# Patient Record
Sex: Male | Born: 1935 | Race: White | Hispanic: No | Smoking: Never smoker
Health system: Southern US, Community
[De-identification: ages and names within clinical notes are randomized; demographics above are authoritative.]

## PROBLEM LIST (undated history)

## (undated) DIAGNOSIS — I2510 Atherosclerotic heart disease of native coronary artery without angina pectoris: Secondary | ICD-10-CM

## (undated) DIAGNOSIS — J449 Chronic obstructive pulmonary disease, unspecified: Secondary | ICD-10-CM

## (undated) DIAGNOSIS — I509 Heart failure, unspecified: Secondary | ICD-10-CM

## (undated) DIAGNOSIS — I10 Essential (primary) hypertension: Secondary | ICD-10-CM

## (undated) DIAGNOSIS — G4730 Sleep apnea, unspecified: Secondary | ICD-10-CM

## (undated) DIAGNOSIS — J45909 Unspecified asthma, uncomplicated: Secondary | ICD-10-CM

## (undated) DIAGNOSIS — J8410 Pulmonary fibrosis, unspecified: Secondary | ICD-10-CM

## (undated) DIAGNOSIS — I219 Acute myocardial infarction, unspecified: Secondary | ICD-10-CM

## (undated) DIAGNOSIS — M1990 Unspecified osteoarthritis, unspecified site: Secondary | ICD-10-CM

## (undated) DIAGNOSIS — K219 Gastro-esophageal reflux disease without esophagitis: Secondary | ICD-10-CM

## (undated) DIAGNOSIS — E785 Hyperlipidemia, unspecified: Secondary | ICD-10-CM

## (undated) DIAGNOSIS — H9190 Unspecified hearing loss, unspecified ear: Secondary | ICD-10-CM

## (undated) DIAGNOSIS — E119 Type 2 diabetes mellitus without complications: Secondary | ICD-10-CM

## (undated) DIAGNOSIS — N179 Acute kidney failure, unspecified: Secondary | ICD-10-CM

## (undated) HISTORY — DX: Gastro-esophageal reflux disease without esophagitis: K21.9

## (undated) HISTORY — DX: Acute myocardial infarction, unspecified: I21.9

## (undated) HISTORY — PX: CORONARY ANGIOPLASTY WITH STENT PLACEMENT: SHX49

## (undated) HISTORY — DX: Hyperlipidemia, unspecified: E78.5

## (undated) HISTORY — PX: TOTAL KNEE ARTHROPLASTY: SHX125

## (undated) HISTORY — PX: JOINT REPLACEMENT: SHX530

## (undated) HISTORY — PX: KNEE SURGERY: SHX244

---

## 2011-09-20 LAB — HM COLONOSCOPY

## 2014-07-03 LAB — HM DIABETES EYE EXAM

## 2015-05-06 ENCOUNTER — Encounter (HOSPITAL_BASED_OUTPATIENT_CLINIC_OR_DEPARTMENT_OTHER): Payer: Self-pay

## 2015-05-06 ENCOUNTER — Emergency Department (HOSPITAL_BASED_OUTPATIENT_CLINIC_OR_DEPARTMENT_OTHER)
Admission: EM | Admit: 2015-05-06 | Discharge: 2015-05-06 | Disposition: A | Discharge status: Home / Self Care | Payer: Medicare Other | Attending: Emergency Medicine | Admitting: Emergency Medicine

## 2015-05-06 DIAGNOSIS — I10 Essential (primary) hypertension: Secondary | ICD-10-CM | POA: Diagnosis not present

## 2015-05-06 DIAGNOSIS — Y998 Other external cause status: Secondary | ICD-10-CM | POA: Diagnosis not present

## 2015-05-06 DIAGNOSIS — Z7982 Long term (current) use of aspirin: Secondary | ICD-10-CM | POA: Insufficient documentation

## 2015-05-06 DIAGNOSIS — Z9861 Coronary angioplasty status: Secondary | ICD-10-CM | POA: Diagnosis not present

## 2015-05-06 DIAGNOSIS — Z79899 Other long term (current) drug therapy: Secondary | ICD-10-CM | POA: Diagnosis not present

## 2015-05-06 DIAGNOSIS — L089 Local infection of the skin and subcutaneous tissue, unspecified: Secondary | ICD-10-CM | POA: Diagnosis not present

## 2015-05-06 DIAGNOSIS — I2510 Atherosclerotic heart disease of native coronary artery without angina pectoris: Secondary | ICD-10-CM | POA: Insufficient documentation

## 2015-05-06 DIAGNOSIS — J449 Chronic obstructive pulmonary disease, unspecified: Secondary | ICD-10-CM | POA: Diagnosis not present

## 2015-05-06 DIAGNOSIS — W108XXA Fall (on) (from) other stairs and steps, initial encounter: Secondary | ICD-10-CM | POA: Diagnosis not present

## 2015-05-06 DIAGNOSIS — I509 Heart failure, unspecified: Secondary | ICD-10-CM | POA: Diagnosis not present

## 2015-05-06 DIAGNOSIS — E119 Type 2 diabetes mellitus without complications: Secondary | ICD-10-CM | POA: Insufficient documentation

## 2015-05-06 DIAGNOSIS — T148XXA Other injury of unspecified body region, initial encounter: Secondary | ICD-10-CM

## 2015-05-06 DIAGNOSIS — Y9389 Activity, other specified: Secondary | ICD-10-CM | POA: Diagnosis not present

## 2015-05-06 DIAGNOSIS — S80812A Abrasion, left lower leg, initial encounter: Secondary | ICD-10-CM | POA: Diagnosis not present

## 2015-05-06 DIAGNOSIS — H9190 Unspecified hearing loss, unspecified ear: Secondary | ICD-10-CM | POA: Diagnosis not present

## 2015-05-06 DIAGNOSIS — Y9289 Other specified places as the place of occurrence of the external cause: Secondary | ICD-10-CM | POA: Insufficient documentation

## 2015-05-06 DIAGNOSIS — L539 Erythematous condition, unspecified: Secondary | ICD-10-CM | POA: Diagnosis present

## 2015-05-06 HISTORY — DX: Essential (primary) hypertension: I10

## 2015-05-06 HISTORY — DX: Type 2 diabetes mellitus without complications: E11.9

## 2015-05-06 HISTORY — DX: Unspecified hearing loss, unspecified ear: H91.90

## 2015-05-06 HISTORY — DX: Sleep apnea, unspecified: G47.30

## 2015-05-06 HISTORY — DX: Heart failure, unspecified: I50.9

## 2015-05-06 HISTORY — DX: Unspecified asthma, uncomplicated: J45.909

## 2015-05-06 HISTORY — DX: Atherosclerotic heart disease of native coronary artery without angina pectoris: I25.10

## 2015-05-06 HISTORY — DX: Chronic obstructive pulmonary disease, unspecified: J44.9

## 2015-05-06 MED ORDER — CEPHALEXIN 500 MG PO CAPS: 500.0000 mg | ORAL_CAPSULE | Freq: Four times a day (QID) | ORAL | Status: DC

## 2015-05-06 NOTE — ED Notes (Signed)
[address] going up steps 2 days ago  Abrasion to left lower leg, w some redness

## 2015-05-06 NOTE — ED Provider Notes (Signed)
CSN: [id number]     Arrival date & time 05/06/15  2048 History   This chart was scribed for Davonna Belling, MD by Forrestine Him, ED Scribe. This patient was seen in room MH01/MH01 and the patient's care was started 9:15 PM.   Chief Complaint  Patient presents with  . Fall   The history is provided by the patient. No language interpreter was used.    HPI Comments: Christopher Holder is a 81 y.o. male with a PMHx of DM, CHF, CAD, COPD, and HTN who presents to the Emergency Department here after a fall sustained 2 days ago. Pt states he was going up some steps when he lost his balance and missed a step resulting in him falling to the ground. No head trauma or LOC. Mr. Goodchild sustained a wound to the L lower extremity. Daughter states area has been draining since fall. Surrounding erythema and warmth also reported. Area of ecchymosis also reported to the L upper extremity. No OTC medications or home remedies attempted prior to arrival. No recent fever, chills, shortness of breath, chest pain, nausea, or vomiting. He is not on any anticoagulants but takes a baby ASA daily. Pt with known allergies to Percocet and Sulfa antibiotics.  Past Medical History  Diagnosis Date  . Diabetes mellitus without complication   . CHF (congestive heart failure)   . Coronary artery disease   . COPD (chronic obstructive pulmonary disease)   . Asthma   . Hypertension   . HOH (hard of hearing)   . Sleep apnea    Past Surgical History  Procedure Laterality Date  . Coronary angioplasty with stent placement    . Knee surgery     No family history on file. Social History  Substance Use Topics  . Smoking status: Never Smoker   . Smokeless tobacco: None  . Alcohol Use: Yes    Review of Systems  Constitutional: Negative for fever and chills.  Respiratory: Negative for cough and shortness of breath.   Cardiovascular: Negative for chest pain.  Gastrointestinal: Negative for nausea, vomiting and abdominal pain.    Genitourinary: Negative for dysuria.  Musculoskeletal: Negative for back pain.  Skin: Positive for wound.  Neurological: Negative for headaches.  Psychiatric/Behavioral: Negative for confusion.      Allergies  Percocet and Sulfa antibiotics  Home Medications   Prior to Admission medications   Medication Sig Start Date End Date Taking? Authorizing Provider  albuterol (PROVENTIL HFA;VENTOLIN HFA) 108 (90 BASE) MCG/ACT inhaler Inhale into the lungs every 6 (six) hours as needed for wheezing or shortness of breath.   Yes Historical Provider, MD  allopurinol (ZYLOPRIM) 100 MG tablet Take 100 mg by mouth daily.   Yes Historical Provider, MD  aspirin 81 MG tablet Take 81 mg by mouth daily.   Yes Historical Provider, MD  bumetanide (BUMEX) 2 MG tablet Take by mouth daily.   Yes Historical Provider, MD  carvedilol (COREG) 6.25 MG tablet Take 6.25 mg by mouth 2 (two) times daily with a meal.   Yes Historical Provider, MD  FLUTICASONE FUROATE IN Inhale into the lungs.   Yes Historical Provider, MD  gabapentin (NEURONTIN) 600 MG tablet Take 600 mg by mouth 3 (three) times daily.   Yes Historical Provider, MD  glimepiride (AMARYL) 2 MG tablet Take 2 mg by mouth 2 (two) times daily.   Yes Historical Provider, MD  mometasone-formoterol (DULERA) 100-5 MCG/ACT AERO Inhale 2 puffs into the lungs 2 (two) times daily.   Yes Historical  Provider, MD  montelukast (SINGULAIR) 10 MG tablet Take 10 mg by mouth at bedtime.   Yes Historical Provider, MD  omeprazole (PRILOSEC) 20 MG capsule Take 20 mg by mouth daily.   Yes Historical Provider, MD  potassium chloride (K-DUR,KLOR-CON) 10 MEQ tablet Take 10 mEq by mouth 2 (two) times daily.   Yes Historical Provider, MD  ramipril (ALTACE) 2.5 MG capsule Take 2.5 mg by mouth daily.   Yes Historical Provider, MD  simvastatin (ZOCOR) 20 MG tablet Take 20 mg by mouth daily.   Yes Historical Provider, MD  cephALEXin (KEFLEX) 500 MG capsule Take 1 capsule (500 mg total)  by mouth 4 (four) times daily. 05/06/15   Davonna Belling, MD   Triage Vitals: BP 116/98 mmHg  Pulse 58  Temp(Src) 97.7 F (36.5 C) (Oral)  Resp 18  Ht 6' (1.829 m)  Wt 280 lb (127.007 kg)  BMI 37.97 kg/m2  SpO2 98%   Physical Exam  Constitutional: He is oriented to person, place, and time. He appears well-developed and well-nourished.  HENT:  Head: Normocephalic.  Eyes: EOM are normal.  Neck: Normal range of motion.  Cardiovascular:  Strong DP pulse on the L  Pulmonary/Chest: Effort normal.  Abdominal: He exhibits no distension.  Musculoskeletal: Normal range of motion.  Mild swelling to both lower extremities There is a 3 cm horizontal abrasion/superficial laceration to the anterior aspect of the L lower leg. Some erythema  No fluctuance  No active drainage  Neurological: He is alert and oriented to person, place, and time.  Psychiatric: He has a normal mood and affect.  Nursing note and vitals reviewed.   ED Course  Procedures (including critical care time)  DIAGNOSTIC STUDIES: Oxygen Saturation is 98% on RA, Normal by my interpretation.    COORDINATION OF CARE: 9:23 PM-Discussed treatment plan with pt at bedside and pt agreed to plan.        Labs Review Labs Reviewed - No data to display  Imaging Review No results found. I have personally reviewed and evaluated these images and lab results as part of my medical decision-making.   EKG Interpretation None      MDM   Final diagnoses:  Infected skin tear    Patient with cellulitis at site of skin tear. Will treat with antibiotics.  I personally performed the services described in this documentation, which was scribed in my presence. The recorded information has been reviewed and is accurate.    Davonna Belling, MD 05/06/15 2325

## 2015-05-06 NOTE — Discharge Instructions (Signed)
Cellulitis Cellulitis is an infection of the skin and the tissue beneath it. The infected area is usually red and tender. Cellulitis occurs most often in the arms and lower legs.  CAUSES  Cellulitis is caused by bacteria that enter the skin through cracks or cuts in the skin. The most common types of bacteria that cause cellulitis are staphylococci and streptococci. SIGNS AND SYMPTOMS   Redness and warmth.  Swelling.  Tenderness or pain.  Fever. DIAGNOSIS  Your health care provider can usually determine what is wrong based on a physical exam. Blood tests may also be done. TREATMENT  Treatment usually involves taking an antibiotic medicine. HOME CARE INSTRUCTIONS   Take your antibiotic medicine as directed by your health care provider. Finish the antibiotic even if you start to feel better.  Keep the infected arm or leg elevated to reduce swelling.  Apply a warm cloth to the affected area up to 4 times per day to relieve pain.  Take medicines only as directed by your health care provider.  Keep all follow-up visits as directed by your health care provider. SEEK MEDICAL CARE IF:   You notice red streaks coming from the infected area.  Your red area gets larger or turns dark in color.  Your bone or joint underneath the infected area becomes painful after the skin has healed.  Your infection returns in the same area or another area.  You notice a swollen bump in the infected area.  You develop new symptoms.  You have a fever. SEEK IMMEDIATE MEDICAL CARE IF:   You feel very sleepy.  You develop vomiting or diarrhea.  You have a general ill feeling (malaise) with muscle aches and pains. MAKE SURE YOU:   Understand these instructions.  Will watch your condition.  Will get help right away if you are not doing well or get worse. Document Released: 06/15/2005 Document Revised: 01/20/2014 Document Reviewed: 11/21/2011 [facility] Patient Information 2015 [place], [place].  This information is not intended to replace advice given to you by your health care provider. Make sure you discuss any questions you have with your health care provider. Skin Tear Care A skin tear is a wound in which the top layer of skin has peeled off. This is a common problem with aging because the skin becomes thinner and more fragile as a person gets older. In addition, some medicines, such as oral corticosteroids, can lead to skin thinning if taken for long periods of time.  A skin tear is often repaired with tape or skin adhesive strips. This keeps the skin that has been peeled off in contact with the healthier skin beneath. Depending on the location of the wound, a bandage (dressing) may be applied over the tape or skin adhesive strips. Sometimes, during the healing process, the skin turns black and dies. Even when this happens, the torn skin acts as a good dressing until the skin underneath gets healthier and repairs itself. HOME CARE INSTRUCTIONS   Change dressings once per day or as directed by your caregiver.  Gently clean the skin tear and the area around the tear using saline solution or mild soap and water.  Do not rub the injured skin dry. Let the area air dry.  Apply petroleum jelly or an antibiotic cream or ointment to keep the tear moist. This will help the wound heal. Do not allow a scab to form.  If the dressing sticks before the next dressing change, moisten it with warm soapy water and gently  remove it.  Protect the injured skin until it has healed.  Only take over-the-counter or prescription medicines as directed by your caregiver.  Take showers or baths using warm soapy water. Apply a new dressing after the shower or bath.  Keep all follow-up appointments as directed by your caregiver.  SEEK IMMEDIATE MEDICAL CARE IF:   You have redness, swelling, or increasing pain in the skin tear.  You havepus coming from the skin tear.  You have chills.  You have a red  streak that goes away from the skin tear.  You have a bad smell coming from the tear or dressing.  You have a fever or persistent symptoms for more than 2-3 days.  You have a fever and your symptoms suddenly get worse. MAKE SURE YOU:  Understand these instructions.  Will watch this condition.  Will get help right away if your child is not doing well or gets worse. Document Released: 05/31/2001 Document Revised: 05/30/2012 Document Reviewed: 03/19/2012 [facility] Patient Information 2015 [place], [place]. This information is not intended to replace advice given to you by your health care provider. Make sure you discuss any questions you have with your health care provider.

## 2015-05-06 NOTE — ED Notes (Signed)
Lost balance/ fall 2 days ago-injury to left LE

## 2015-05-26 ENCOUNTER — Telehealth: Payer: Self-pay | Admitting: Behavioral Health

## 2015-05-26 ENCOUNTER — Encounter: Payer: Self-pay | Admitting: Behavioral Health

## 2015-05-26 NOTE — Telephone Encounter (Signed)
Error

## 2015-05-26 NOTE — Telephone Encounter (Signed)
Pre-Visit Call completed with patient and chart updated.   Pre-Visit Info documented in Specialty Comments under [employer].    

## 2015-05-27 ENCOUNTER — Encounter: Payer: Self-pay | Admitting: Medical

## 2015-05-27 ENCOUNTER — Ambulatory Visit (INDEPENDENT_AMBULATORY_CARE_PROVIDER_SITE_OTHER): Payer: Medicare Other | Admitting: Medical

## 2015-05-27 VITALS — BP 110/65 | HR 60 | Temp 98.5°F | Ht 71.5 in | Wt 286.0 lb

## 2015-05-27 DIAGNOSIS — J438 Other emphysema: Secondary | ICD-10-CM

## 2015-05-27 DIAGNOSIS — I509 Heart failure, unspecified: Secondary | ICD-10-CM | POA: Insufficient documentation

## 2015-05-27 DIAGNOSIS — Z23 Encounter for immunization: Secondary | ICD-10-CM

## 2015-05-27 DIAGNOSIS — J3089 Other allergic rhinitis: Secondary | ICD-10-CM

## 2015-05-27 DIAGNOSIS — E1140 Type 2 diabetes mellitus with diabetic neuropathy, unspecified: Secondary | ICD-10-CM | POA: Insufficient documentation

## 2015-05-27 DIAGNOSIS — J309 Allergic rhinitis, unspecified: Secondary | ICD-10-CM | POA: Insufficient documentation

## 2015-05-27 DIAGNOSIS — K219 Gastro-esophageal reflux disease without esophagitis: Secondary | ICD-10-CM

## 2015-05-27 DIAGNOSIS — G4730 Sleep apnea, unspecified: Secondary | ICD-10-CM | POA: Diagnosis not present

## 2015-05-27 DIAGNOSIS — E119 Type 2 diabetes mellitus without complications: Secondary | ICD-10-CM | POA: Insufficient documentation

## 2015-05-27 DIAGNOSIS — E785 Hyperlipidemia, unspecified: Secondary | ICD-10-CM | POA: Diagnosis not present

## 2015-05-27 DIAGNOSIS — I10 Essential (primary) hypertension: Secondary | ICD-10-CM | POA: Diagnosis not present

## 2015-05-27 DIAGNOSIS — I5022 Chronic systolic (congestive) heart failure: Secondary | ICD-10-CM

## 2015-05-27 HISTORY — DX: Hyperlipidemia, unspecified: E78.5

## 2015-05-27 HISTORY — DX: Gastro-esophageal reflux disease without esophagitis: K21.9

## 2015-05-27 MED ORDER — INFLUENZA VAC SPLIT QUAD 0.5 ML IM SUSY
0.5000 mL | PREFILLED_SYRINGE | Freq: Once | INTRAMUSCULAR | Status: AC
  Administered 2015-05-27: 0.5 mL via INTRAMUSCULAR

## 2015-05-27 NOTE — Assessment & Plan Note (Signed)
On singulair

## 2015-05-27 NOTE — Assessment & Plan Note (Signed)
controlled on prilosec.

## 2015-05-27 NOTE — Patient Instructions (Addendum)
Asthma and copd- stable presently. On on albuterol and dulera.  Diabetes type II- only on glimepiride.   Sleep apnea- use machine at night.  Hyperlipidemia- on zocor.  Hypertension- on carvedilol and bumex.  Pt had MI in past. Stents placed 81 yo.  Gerd hx- congtrolled on prilosec.  Allergic rhinitis- seasonal. On singulair.   Diabetic neuropathy- he is on neurontin.  Continue all current meds9appear stable today) and sign release form so we can get prior records. Get basic labs today.  Refer to cardiologist, pulmonologist and ophthalmologist.  Follow up in 1 month or as needed  I want you to start checking bp daily. Dccument readings and call us in a week.

## 2015-05-27 NOTE — Progress Notes (Signed)
Subjective:    Patient ID: Christopher Holder, male    DOB: 10-04-1929, 81 y.o.   MRN: [id number]  HPI  I have reviewed pt PMH, PSH, FH, Social History and Surgical History  Asthma and copd- stable presently. On on albuterol and dulera.  Diabetes type II- only on glimepiride.   Sleep apnea- use machine at night.  Hyperlipidemia- on zocor.  Hypertension- on carvedilol and bumex.  Pt had MI in past. Stents placed 81 yo.  Gerd hx- controlled on prilosec.  Allergic rhinitis- seasonal. On singulair.   Diabetic neuropathy- he is on neurontin.   Pt sees cardiologist and pulmonologist. Last saw cardiologist 3 months ago. Pt has pulmonologist appointment next week. Pt sees lung MD every 4-6 months. Pt needs new referrals.   Pt needs eye md referal for yearly check up.   Pt just moved from [place], [place]     Review of Systems  Constitutional: Negative for fever, chills, diaphoresis, activity change and fatigue.  Respiratory: Negative for cough, chest tightness and shortness of breath.   Cardiovascular: Negative for chest pain, palpitations and leg swelling.  Gastrointestinal: Negative for nausea, vomiting and abdominal pain.  Musculoskeletal: Negative for neck pain and neck stiffness.  Neurological: Negative for dizziness, tremors, seizures, syncope, facial asymmetry, speech difficulty, weakness, light-headedness, numbness and headaches.  Psychiatric/Behavioral: Negative for behavioral problems, confusion and agitation. The patient is not nervous/anxious.    Past Medical History  Diagnosis Date  . Diabetes mellitus without complication   . CHF (congestive heart failure)   . Coronary artery disease   . COPD (chronic obstructive pulmonary disease)   . Asthma   . Hypertension   . HOH (hard of hearing)   . Sleep apnea   . Myocardial infarction     Social History   Social History  . Marital Status: Widowed    Spouse Name: [name]  . Number of Children: [name]  . Years of Education:  [name]   Occupational History  . Not on file.   Social History Main Topics  . Smoking status: Never Smoker   . Smokeless tobacco: Never Used  . Alcohol Use: No  . Drug Use: No  . Sexual Activity: Not on file   Other Topics Concern  . Not on file   Social History Narrative    Past Surgical History  Procedure Laterality Date  . Coronary angioplasty with stent placement    . Knee surgery    . Knee surgery      Knee replacement x2    Family History  Problem Relation Age of Onset  . Cancer Mother   . Diabetes Father     Allergies  Allergen Reactions  . Percocet [Oxycodone-Acetaminophen] Diarrhea  . Sulfa Antibiotics Diarrhea and Nausea And Vomiting    Current Outpatient Prescriptions on File Prior to Visit  Medication Sig Dispense Refill  . albuterol (PROVENTIL HFA;VENTOLIN HFA) 108 (90 BASE) MCG/ACT inhaler Inhale into the lungs every 6 (six) hours as needed for wheezing or shortness of breath.    . allopurinol (ZYLOPRIM) 100 MG tablet Take 100 mg by mouth daily.    Marland Kitchen aspirin 81 MG tablet Take 81 mg by mouth daily.    . bumetanide (BUMEX) 2 MG tablet Take by mouth daily.    . carvedilol (COREG) 6.25 MG tablet Take 6.25 mg by mouth 2 (two) times daily with a meal.    . ciclopirox (LOPROX) 0.77 % cream Apply topically at bedtime.    Marland Kitchen FLUTICASONE FUROATE IN  Inhale into the lungs.    . gabapentin (NEURONTIN) 600 MG tablet Take 600 mg by mouth 3 (three) times daily.    Marland Kitchen glimepiride (AMARYL) 2 MG tablet Take 2 mg by mouth 2 (two) times daily.    . mometasone-formoterol (DULERA) 100-5 MCG/ACT AERO Inhale 2 puffs into the lungs 2 (two) times daily.    . montelukast (SINGULAIR) 10 MG tablet Take 10 mg by mouth at bedtime.    Marland Kitchen omeprazole (PRILOSEC) 20 MG capsule Take 20 mg by mouth daily.    . potassium chloride (K-DUR,KLOR-CON) 10 MEQ tablet Take 10 mEq by mouth 2 (two) times daily.    . ramipril (ALTACE) 2.5 MG capsule Take 2.5 mg by mouth daily.    . simvastatin (ZOCOR) 20  MG tablet Take 20 mg by mouth daily.     No current facility-administered medications on file prior to visit.    BP 123/37 mmHg  Pulse 60  Temp(Src) 98.5 F (36.9 C) (Oral)  Ht 5' 11.5" (1.816 m)  Wt 286 lb (129.729 kg)  BMI 39.34 kg/m2  SpO2 96%       Objective:   Physical Exam   General Mental Status- Alert. General Appearance- Not in acute distress.   Skin General: Color- Normal Color. Moisture- Normal Moisture.  Neck Carotid Arteries- Normal color. Moisture- Normal Moisture. No carotid bruits. No JVD.  Chest and Lung Exam Auscultation: Breath Sounds:-Normal.  Cardiovascular Auscultation:Rythm- Regular. Murmurs & Other Heart Sounds:Auscultation of the heart reveals- No Murmurs.  Abdomen Inspection:-Inspeection Normal. Palpation/Percussion:Note:No mass. Palpation and Percussion of the abdomen reveal- Non Tender, Non Distended + BS, no rebound or guarding.    Neurologic Cranial Nerve exam:- CN III-XII intact(No nystagmus), symmetric smile. Drift Test:- No drift. Romberg Exam:- Negative.  Heal to Toe Gait exam:-Normal. Finger to Nose:- Normal/Intact Strength:- 5/5 equal and symmetric strength both upper and lower extremities.  Lower ext- see quality metrics.     Assessment & Plan:  Asthma and copd- stable presently. On on albuterol and dulera.  Diabetes type II- only on glimepiride.   Sleep apnea- use machine at night.  Hyperlipidemia- on zocor.  Hypertension- on carvedilol and bumex.  Pt had MI in past. Stents placed 81 yo.  Gerd hx- congtrolled on prilosec.  Allergic rhinitis- seasonal. On singulair.   Diabetic neuropathy- he is on neurontin.  Continue all current meds9appear stable today) and sign release form so we can get prior records. Get basic labs today.  Refer to cardiologist, pulmonologist and ophthalmologist.  Follow up in 1 month or as needed  I want you to start checking bp daily. Dccument readings and call us in a week.

## 2015-05-27 NOTE — Assessment & Plan Note (Signed)
on carvedilol and bumex.

## 2015-05-27 NOTE — Assessment & Plan Note (Signed)
on glimepiride. Get a1c today.

## 2015-05-27 NOTE — Assessment & Plan Note (Signed)
on zocor.

## 2015-05-27 NOTE — Progress Notes (Signed)
Pre visit review using our clinic review tool, if applicable. No additional management support is needed unless otherwise documented below in the visit note. 

## 2015-05-27 NOTE — Assessment & Plan Note (Signed)
On gabapentin.  

## 2015-05-28 ENCOUNTER — Encounter: Payer: Self-pay | Admitting: Medical

## 2015-05-28 LAB — CBC WITH DIFFERENTIAL/PLATELET
BASOS PCT: 1.1 % (ref 0.0–3.0)
Basophils Absolute: 0.1 10*3/uL (ref 0.0–0.1)
EOS PCT: 1.3 % (ref 0.0–5.0)
Eosinophils Absolute: 0.1 10*3/uL (ref 0.0–0.7)
HCT: 39.8 % (ref 39.0–52.0)
HEMOGLOBIN: 12.9 g/dL — AB (ref 13.0–17.0)
Lymphocytes Relative: 26.4 % (ref 12.0–46.0)
Lymphs Abs: 2.1 10*3/uL (ref 0.7–4.0)
MCHC: 32.5 g/dL (ref 30.0–36.0)
MCV: 92.1 fl (ref 78.0–100.0)
MONO ABS: 0.6 10*3/uL (ref 0.1–1.0)
Monocytes Relative: 7.7 % (ref 3.0–12.0)
Neutro Abs: 5.1 10*3/uL (ref 1.4–7.7)
Neutrophils Relative %: 63.5 % (ref 43.0–77.0)
Platelets: 184 10*3/uL (ref 150.0–400.0)
RBC: 4.32 Mil/uL (ref 4.22–5.81)
RDW: 16.7 % — AB (ref 11.5–15.5)
WBC: 8 10*3/uL (ref 4.0–10.5)

## 2015-05-28 LAB — COMPREHENSIVE METABOLIC PANEL
ALBUMIN: 3.7 g/dL (ref 3.5–5.2)
ALT: 15 U/L (ref 0–53)
AST: 14 U/L (ref 0–37)
Alkaline Phosphatase: 101 U/L (ref 39–117)
BUN: 30 mg/dL — AB (ref 6–23)
CALCIUM: 8.7 mg/dL (ref 8.4–10.5)
CHLORIDE: 101 meq/L (ref 96–112)
CO2: 35 mEq/L — ABNORMAL HIGH (ref 19–32)
Creatinine, Ser: 1.48 mg/dL (ref 0.40–1.50)
GFR: 48.25 mL/min — ABNORMAL LOW (ref >60.00)
Glucose, Bld: 106 mg/dL — ABNORMAL HIGH (ref 70–99)
POTASSIUM: 4.2 meq/L (ref 3.5–5.1)
SODIUM: 142 meq/L (ref 135–145)
Total Bilirubin: 0.4 mg/dL (ref 0.2–1.2)
Total Protein: 6.5 g/dL (ref 6.0–8.3)

## 2015-05-28 LAB — HEMOGLOBIN A1C: HEMOGLOBIN A1C: 6 % (ref 4.6–6.5)

## 2015-05-29 ENCOUNTER — Telehealth: Payer: Self-pay | Admitting: Medical

## 2015-05-29 NOTE — Telephone Encounter (Signed)
Pt returning call for lab results. Best # [phone].

## 2015-05-29 NOTE — Telephone Encounter (Signed)
Spoke with pt and he voices understanding.  

## 2015-06-02 ENCOUNTER — Telehealth: Payer: Self-pay | Admitting: *Deleted

## 2015-06-02 NOTE — Telephone Encounter (Signed)
Medical records received via mail from [facility]. Forwarded to [facility]. JG//CMA

## 2015-06-19 ENCOUNTER — Ambulatory Visit (INDEPENDENT_AMBULATORY_CARE_PROVIDER_SITE_OTHER): Payer: Medicare Other | Admitting: Cardiology

## 2015-06-19 ENCOUNTER — Encounter: Payer: Self-pay | Admitting: Cardiology

## 2015-06-19 VITALS — BP 132/42 | HR 41 | Ht 71.0 in | Wt 286.2 lb

## 2015-06-19 DIAGNOSIS — E669 Obesity, unspecified: Secondary | ICD-10-CM | POA: Diagnosis not present

## 2015-06-19 DIAGNOSIS — I10 Essential (primary) hypertension: Secondary | ICD-10-CM

## 2015-06-19 DIAGNOSIS — I2510 Atherosclerotic heart disease of native coronary artery without angina pectoris: Secondary | ICD-10-CM

## 2015-06-19 DIAGNOSIS — K219 Gastro-esophageal reflux disease without esophagitis: Secondary | ICD-10-CM | POA: Insufficient documentation

## 2015-06-19 DIAGNOSIS — E785 Hyperlipidemia, unspecified: Secondary | ICD-10-CM

## 2015-06-19 DIAGNOSIS — G4733 Obstructive sleep apnea (adult) (pediatric): Secondary | ICD-10-CM | POA: Insufficient documentation

## 2015-06-19 DIAGNOSIS — J438 Other emphysema: Secondary | ICD-10-CM | POA: Insufficient documentation

## 2015-06-19 NOTE — Patient Instructions (Signed)
Medication Instructions:  Your physician recommends that you continue on your current medications as directed. Please refer to the Current Medication list given to you today.  Labwork: NONE  Testing/Procedures: NONE  Follow-Up: Your physician wants you to follow-up in: 6 months with Dr. Marlou Porch. You will receive a reminder letter in the mail two months in advance. If you don't receive a letter, please call our office to schedule the follow-up appointment.

## 2015-06-19 NOTE — Progress Notes (Signed)
Cardiology Office Note   Date:  06/19/2015   ID:  Christopher Holder, DOB Jan 11, 1930, MRN [id number]  PCP:  Mackie Pai, PA-C  Cardiologist:   Candee Furbish, MD       History of Present Illness: Christopher Holder is a 81 y.o. male here to establish care. Moved from [place]. CAD post MI with PCI over 14 years ago. Hypertension, GERD, diabetes with associated neuropathy and COPD. Last cardiology visit was in the summer time.  Lives in [place]. Worried about driving. Dr. Leonia Corona in [place] was his cardiologist. [facility].  Inferior infarction/scar.   Low diastolic pressure. Fell left eye shoulder knee. No syncope. Bent over to pick up great grandchild. EMS to help pick up.   No CP, no significant SOB.   Had MI on tour bus in [place], started sweating. No real chest pain. 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 99%.   No cath since since.  Had 2 CHF episodes in hospitals about 5 years ago last episode.     Past Medical History  Diagnosis Date  . Diabetes mellitus without complication   . CHF (congestive heart failure)   . Coronary artery disease   . COPD (chronic obstructive pulmonary disease)   . Asthma   . Hypertension   . HOH (hard of hearing)   . Sleep apnea   . Myocardial infarction   . Hyperlipidemia 05/27/2015  . GERD (gastroesophageal reflux disease) 05/27/2015    Past Surgical History  Procedure Laterality Date  . Coronary angioplasty with stent placement    . Knee surgery    . Knee surgery      Knee replacement x2     Current Outpatient Prescriptions  Medication Sig Dispense Refill  . albuterol (PROVENTIL HFA;VENTOLIN HFA) 108 (90 BASE) MCG/ACT inhaler Inhale into the lungs every 6 (six) hours as needed for wheezing or shortness of breath.    . allopurinol (ZYLOPRIM) 100 MG tablet Take 100 mg by mouth 2 (two) times daily.     Marland Kitchen aspirin 81 MG tablet Take 81 mg by mouth daily.    . bumetanide (BUMEX) 2 MG tablet Take 3 mg by mouth daily.     . carvedilol  (COREG) 6.25 MG tablet Take 6.25 mg by mouth 2 (two) times daily with a meal.    . ciclopirox (PENLAC) 8 % solution Apply 1 application topically daily. Apply over nail and surrounding skin. Apply daily over previous coat. After seven (7) days, may remove with alcohol and continue cycle.    . fluticasone (FLONASE) 50 MCG/ACT nasal spray Place 2 sprays into both nostrils daily.    Marland Kitchen gabapentin (NEURONTIN) 600 MG tablet Take 1 tablet by mouth in the morning and at supper and take 2 tablets by mouth at bedtime    . glimepiride (AMARYL) 2 MG tablet Take 2 mg by mouth daily.     . mometasone-formoterol (DULERA) 100-5 MCG/ACT AERO Inhale 2 puffs into the lungs 2 (two) times daily.    . montelukast (SINGULAIR) 10 MG tablet Take 10 mg by mouth at bedtime.    . NON FORMULARY CPAP at night    . omeprazole (PRILOSEC) 20 MG capsule Take 20 mg by mouth daily.    . potassium chloride (K-DUR,KLOR-CON) 10 MEQ tablet Take 10 mEq by mouth 2 (two) times daily.    . ramipril (ALTACE) 2.5 MG capsule Take 2.5 mg by mouth daily.    . simvastatin (ZOCOR) 20 MG tablet Take 20 mg by mouth  daily.     No current facility-administered medications for this visit.    Allergies:   Percocet and Sulfa antibiotics    Social History:  The patient  reports that he has never smoked. He has never used smokeless tobacco. He reports that he does not drink alcohol or use illicit drugs.   Family History:  The patient's family history includes Cancer in his mother; Diabetes in his father.    ROS:  Please see the history of present illness.   Otherwise, review of systems are positive for none.   All other systems are reviewed and negative.    PHYSICAL EXAM: VS:  BP 132/42 mmHg  Pulse 41  Ht 5\' 11"  (1.803 m)  Wt 286 lb 3.2 oz (129.819 kg)  BMI 39.93 kg/m2 , BMI Body mass index is 39.93 kg/(m^2). GEN: Well nourished, well developed, in no acute distress HEENT: normal Neck: no JVD, carotid bruits, or masses Cardiac: RRR; no  murmurs, rubs, or gallops,no edema  Respiratory:  clear to auscultation bilaterally, normal work of breathing GI: soft, nontender, nondistended, + BS MS: no deformity or atrophy Skin: warm and dry, no rash Neuro:  Strength and sensation are intact Psych: euthymic mood, full affect   EKG:  Today 06/19/15- sinus bradycardia rate 50 with first-degree AV block , PR interval 218 ms with frequent PVCs, ventricular bigeminy pattern.  Recent Labs: 05/27/2015: ALT 15; BUN 30*; Creatinine, Ser 1.48; Hemoglobin 12.9*; Platelets 184.0; Potassium 4.2; Sodium 142    Lipid Panel No results found for: CHOL, TRIG, HDL, CHOLHDL, VLDL, LDLCALC, LDLDIRECT    Wt Readings from Last 3 Encounters:  06/19/15 286 lb 3.2 oz (129.819 kg)  05/27/15 286 lb (129.729 kg)  05/06/15 280 lb (127.007 kg)      Other studies Reviewed: Additional studies/ records that were reviewed today include: Prior office note, lab work, EKG. Review of the above records demonstrates: As above   ASSESSMENT AND PLAN:  Coronary artery disease-status post myocardial infarction approximately 14 years ago with stent placement. Doing well, no anginal symptoms. Establishing care.  Hyperlipidemia-currently on simvastatin. LDL goal less than 70.  Diabetes with peripheral neuropathy-Neurontin, oral medication glimepiride per primary.  GERD-Prilosec.  Sleep apnea-obstructive sleep apnea-CPAP at night.  COPD-on both albuterol as well as Dulera.  Essential hypertension-low diastolic pressure. No syncope. We will continue to monitor. I would like to see what his last echo look like. Question aortic regurgitation. I do not hear any diastolic murmur however. This may be from lack of vascular elasticity as one ages.  Frequent PVCs- ventricular bigeminy noted on EKG.   Current medicines are reviewed at length with the patient today.  The patient does not have concerns regarding medicines.  The following changes have been made:  no  change  Labs/ tests ordered today include: none  No orders of the defined types were placed in this encounter.     Disposition:   FU with [facility] in 6 months  Signed, Candee Furbish, MD  06/19/2015 9:00 AM    [facility] [address], [place], [place]  [address] Phone: [phone]; Fax: [phone]

## 2015-06-23 LAB — HM DIABETES EYE EXAM

## 2015-06-25 ENCOUNTER — Encounter: Payer: Self-pay | Admitting: Medical

## 2015-06-26 ENCOUNTER — Ambulatory Visit (INDEPENDENT_AMBULATORY_CARE_PROVIDER_SITE_OTHER): Payer: Medicare Other | Admitting: Medical

## 2015-06-26 ENCOUNTER — Encounter: Payer: Medicare Other | Admitting: Medical

## 2015-06-26 ENCOUNTER — Encounter: Payer: Self-pay | Admitting: Medical

## 2015-06-26 VITALS — BP 116/60 | HR 51 | Temp 98.2°F | Ht 71.5 in | Wt 288.0 lb

## 2015-06-26 DIAGNOSIS — S46811A Strain of other muscles, fascia and tendons at shoulder and upper arm level, right arm, initial encounter: Secondary | ICD-10-CM

## 2015-06-26 DIAGNOSIS — I10 Essential (primary) hypertension: Secondary | ICD-10-CM

## 2015-06-26 DIAGNOSIS — E119 Type 2 diabetes mellitus without complications: Secondary | ICD-10-CM | POA: Diagnosis not present

## 2015-06-26 DIAGNOSIS — Z9181 History of falling: Secondary | ICD-10-CM

## 2015-06-26 DIAGNOSIS — I5022 Chronic systolic (congestive) heart failure: Secondary | ICD-10-CM

## 2015-06-26 DIAGNOSIS — B351 Tinea unguium: Secondary | ICD-10-CM

## 2015-06-26 MED ORDER — MOMETASONE FURO-FORMOTEROL FUM 100-5 MCG/ACT IN AERO: 2.0000 | INHALATION_SPRAY | Freq: Two times a day (BID) | RESPIRATORY_TRACT | Status: DC

## 2015-06-26 NOTE — Progress Notes (Signed)
Subjective:    Patient ID: Christopher Holder, male    DOB: 1929-11-22, 81 y.o.   MRN: [id number]  HPI  Pt in for follow up.  He states he fell twice since l last saw him. One time he leaned over to pick up his grandson and lost his balance. No loc. He had slight bruise under his left eye. He also abrased his left knee. Knee does not hurt. No fascial pain. No ha or neurologic symptoms.His rt side of neck since fall has been sore. The soreness is slowly improving but still present. Fall happened on Sept 24th. EMT evaluated pt. He was not taken to ED.  Then he states fall early September at the same exact place. At area of inclined sidewalk beside the house. That fall was described as minimal without injury.  Pt does walk with cane. But he fell when he let go of cane.  Pt has seen cardiologist. He was told to stay on same medications. Pt bp readings reviewed. Pt states cardiologist was not concerned about his lower side  Diastolics. His new machine is reading higher diastolic.  Pt weights have been stable around 280 lb. Pt bood sugars range 107 to 227(but after cookies).  Pt average blood sugar/a1c was 6.0 on last labs.  History of topical treatment for toenail fungus.Pt saw podiatrist in [place]. He want to see local podistrist.    Review of Systems  Constitutional: Negative for fever, chills, diaphoresis, activity change and fatigue.  Respiratory: Negative for cough, chest tightness and shortness of breath.   Cardiovascular: Negative for chest pain, palpitations and leg swelling.  Gastrointestinal: Negative for nausea, vomiting and abdominal pain.  Musculoskeletal: Positive for neck pain. Negative for neck stiffness.       Rt side trapezius pain after fall. Gradually improving.  Skin:       2nd toe nail fungus both feet.  Neurological: Negative for dizziness, seizures, syncope, weakness, light-headedness and headaches.  Psychiatric/Behavioral: Negative for behavioral problems, confusion  and agitation. The patient is not nervous/anxious.     Past Medical History  Diagnosis Date  . Diabetes mellitus without complication ([place])   . CHF (congestive heart failure) ([place])   . Coronary artery disease   . COPD (chronic obstructive pulmonary disease) ([place])   . Asthma   . Hypertension   . HOH (hard of hearing)   . Sleep apnea   . Myocardial infarction ([place])   . Hyperlipidemia 05/27/2015  . GERD (gastroesophageal reflux disease) 05/27/2015    Social History   Social History  . Marital Status: Widowed    Spouse Name: [name]  . Number of Children: [name]  . Years of Education: [name]   Occupational History  . Not on file.   Social History Main Topics  . Smoking status: Never Smoker   . Smokeless tobacco: Never Used  . Alcohol Use: No  . Drug Use: No  . Sexual Activity: Not on file   Other Topics Concern  . Not on file   Social History Narrative    Past Surgical History  Procedure Laterality Date  . Coronary angioplasty with stent placement    . Knee surgery    . Knee surgery      Knee replacement x2    Family History  Problem Relation Age of Onset  . Cancer Mother   . Diabetes Father     Allergies  Allergen Reactions  . Percocet [Oxycodone-Acetaminophen] Diarrhea  . Sulfa Antibiotics Diarrhea and Nausea And Vomiting  Current Outpatient Prescriptions on File Prior to Visit  Medication Sig Dispense Refill  . albuterol (PROVENTIL HFA;VENTOLIN HFA) 108 (90 BASE) MCG/ACT inhaler Inhale into the lungs every 6 (six) hours as needed for wheezing or shortness of breath.    . allopurinol (ZYLOPRIM) 100 MG tablet Take 100 mg by mouth 2 (two) times daily.     Marland Kitchen aspirin 81 MG tablet Take 81 mg by mouth daily.    . bumetanide (BUMEX) 2 MG tablet Take 3 mg by mouth daily.     . carvedilol (COREG) 6.25 MG tablet Take 6.25 mg by mouth 2 (two) times daily with a meal.    . ciclopirox (PENLAC) 8 % solution Apply 1 application topically daily. Apply over nail and surrounding  skin. Apply daily over previous coat. After seven (7) days, may remove with alcohol and continue cycle.    . fluticasone (FLONASE) 50 MCG/ACT nasal spray Place 2 sprays into both nostrils daily.    Marland Kitchen gabapentin (NEURONTIN) 600 MG tablet Take 1 tablet by mouth in the morning and at supper and take 2 tablets by mouth at bedtime    . glimepiride (AMARYL) 2 MG tablet Take 2 mg by mouth daily.     . mometasone-formoterol (DULERA) 100-5 MCG/ACT AERO Inhale 2 puffs into the lungs 2 (two) times daily.    . montelukast (SINGULAIR) 10 MG tablet Take 10 mg by mouth at bedtime.    . NON FORMULARY CPAP at night    . omeprazole (PRILOSEC) 20 MG capsule Take 20 mg by mouth daily.    . potassium chloride (K-DUR,KLOR-CON) 10 MEQ tablet Take 10 mEq by mouth 2 (two) times daily.    . ramipril (ALTACE) 2.5 MG capsule Take 2.5 mg by mouth daily.    . simvastatin (ZOCOR) 20 MG tablet Take 20 mg by mouth daily.     No current facility-administered medications on file prior to visit.    BP 116/60 mmHg  Pulse 51  Temp(Src) 98.2 F (36.8 C) (Oral)  Ht 5' 11.5" (1.816 m)  Wt 288 lb (130.636 kg)  BMI 39.61 kg/m2  SpO2 98%       Objective:   Physical Exam  General Mental Status- Alert. General Appearance- Not in acute distress.   Head- normocephalic, atraumatic. No bruising to face or battle signs.  Skin General: Color- Normal Color. Moisture- Normal Moisture.  Neck Carotid Arteries- Normal color. Moisture- Normal Moisture. No carotid bruits. No JVD. No mid cspine tenderness. Mild rt trapezius tenderness to palpation.  Chest and Lung Exam Auscultation: Breath Sounds:-Normal. CTA.  Cardiovascular Auscultation:Rythm- Regular. Murmurs & Other Heart Sounds:Auscultation of the heart reveals- No Murmurs.  Abdomen Palpation/Percussion:Note:No mass. Palpation and Percussion of the abdomen reveal- Non Tender, Non Distended + BS, no rebound or guarding.   Neurologic Cranial Nerve exam:- CN III-XII  intact(No nystagmus), symmetric smile. Strength:- 5/5 equal and symmetric strength both upper and lower extremities.  Lt knee- small abrasion but normal rom. No tenderness.  Feet- bilateral discolored nails 2nd toes.       Assessment & Plan:  For recent falls will refer you to PT for evaluation and gait assessment.   For htn continue same meds. When you check your bp levels also document pulses.  Your A1-c average was good. I want you to watch for any bs levels below 70. If you do have such low values may drop your amaryl. Discussed if such were to occur measures to bring BS up.  Chf. Weight stable. Check  weights and watch for pedal edema and dypspnea. If occurs notify us.  Will refer you to podiatry for nail fungus.  For your trapezius strain low dose tylenol or ibuprofen only. Muscle relaxant would not be recommended in your age group/recent falls.  Follow up in one month or as needed

## 2015-06-26 NOTE — Progress Notes (Signed)
Pre visit review using our clinic review tool, if applicable. No additional management support is needed unless otherwise documented below in the visit note. 

## 2015-06-26 NOTE — Patient Instructions (Addendum)
For recent falls will refer you to PT for evaluation and gait assessment.   For htn continue same meds. When you check your bp levels also document pulses.  Your A1-c average was good. I want you to watch for any bs levels below 70. If you do have such low values may drop your amaryl. Discussed if such were to occur measures to bring BS up.  Chf. Weight stable. Check weights and watch for pedal edema and dypspnea. If occurs notify us.  Will refer you to podiatry for nail fungus.  For your trapezius strain low dose tylenol or ibuprofen only. Muscle relaxant would not be recommended in your age group/recent falls.  Follow up in tw0 month or as needed

## 2015-06-28 NOTE — Progress Notes (Signed)
This encounter was created in error - please disregard.

## 2015-07-20 ENCOUNTER — Telehealth: Payer: Self-pay | Admitting: Medical

## 2015-07-20 NOTE — Telephone Encounter (Signed)
Caller name:Kelin Relationship to patient: Can be reached:[phone] Pharmacy:[facility]  [address] [place]   Reason for call:GABAPINTIN 600 MG TAKE 4 PER DAY, MONTELUKAST 10 MG TAKE 1 PER DAY,

## 2015-07-20 NOTE — Telephone Encounter (Signed)
Pt last seen 06/26/15. Please advise on refills.

## 2015-07-21 MED ORDER — MONTELUKAST SODIUM 10 MG PO TABS: 10.0000 mg | ORAL_TABLET | Freq: Every day | ORAL | Status: DC

## 2015-07-21 MED ORDER — GABAPENTIN 600 MG PO TABS: ORAL_TABLET | ORAL | Status: DC

## 2015-07-21 NOTE — Telephone Encounter (Signed)
Notify pt that I did refill his meds. But regarding gabapentin. I wrote 3 times a day. He is on high dose tablet. With his age I think 4 tabs is too much.

## 2015-07-22 NOTE — Telephone Encounter (Signed)
Spoke with pt and he voices understanding.  

## 2015-07-28 ENCOUNTER — Ambulatory Visit (INDEPENDENT_AMBULATORY_CARE_PROVIDER_SITE_OTHER): Payer: Medicare Other | Admitting: Podiatry

## 2015-07-28 ENCOUNTER — Encounter: Payer: Self-pay | Admitting: Podiatry

## 2015-07-28 VITALS — Ht 72.0 in | Wt 289.5 lb

## 2015-07-28 DIAGNOSIS — E0842 Diabetes mellitus due to underlying condition with diabetic polyneuropathy: Secondary | ICD-10-CM | POA: Diagnosis not present

## 2015-07-28 DIAGNOSIS — W450XXA Nail entering through skin, initial encounter: Secondary | ICD-10-CM

## 2015-07-28 DIAGNOSIS — B351 Tinea unguium: Secondary | ICD-10-CM

## 2015-07-28 NOTE — Patient Instructions (Signed)
Seen for hypertrophic nails. Dark nail may caused from an injury in shoe. All nails debrided. Noted of healthy new nail growth.  Return in 3 months or as needed.

## 2015-07-28 NOTE — Progress Notes (Signed)
SUBJECTIVE: 81 y.o. year old male accompanied by his daughter presents for diabetic foot care. Blood sugar was 97 this morning.  Stated that he has developed fungal infection in two toes and got prescription for toe nails. He noted his nails been black for the last 3 months.   CHF, Stent, tow total knee replacement, Gout, Neuropathy, Diabetes, HTN,  REVIEW OF SYSTEMS: Constitutional: negative for chills, fatigue, fevers, night sweats and weight loss Eyes: negative Ears, nose, mouth, throat, and face: negative Respiratory: negative Cardiovascular: Positive for CHF, Hypertension, Stent. Gastrointestinal: negative Genitourinary:negative Hematologic/lymphatic: History of Gout. Musculoskeletal:Both knees have total replacement. Neurological: Postive of Neuropathy. Endocrine: NIDDM. Allergic/Immunologic: Sulpha, Percocet.   OBJECTIVE: DERMATOLOGIC EXAMINATION: Nails: Hypertrophic nails x 10. Thick dystrophic nails with dark discoloration at distal 3/4 of 2nd digit bilateral with sticky surface residue from topical medication. Positive of red inflamed proximal skin folder on both 2nd digits.  Noted of no associated open lesion or drainage.  VASCULAR EXAMINATION OF LOWER LIMBS: Pedal pulses: All pedal pulses are palpable with normal pulsation.  Positive of mild pedal edema left foot.  Temperature gradient from tibial crest to dorsum of foot is within normal bilateral. NEUROLOGIC EXAMINATION OF THE LOWER LIMBS: All epicritic and tactile sensations grossly intact.  MUSCULOSKELETAL EXAMINATION: Rectus foot without gross deformities bilateral.   ASSESSMENT: Old injured nail 2nd digit bilateral.  Thick nail plate with dry blood under nail plate (distal 3/4) with topical residue on the plate. Inflamed proximal skin folder possible due to irritation from topical treatment. Hypertrophic nails x 10.  NIDDM under control.   PLAN: Reviewed findings and available treatment options. All nails  debrided  Home care instruction given to stop using topical medication to the affected nail since there is hypersensitivity to skin.  Return in 3 months or sooner if needed.

## 2015-08-07 ENCOUNTER — Ambulatory Visit (INDEPENDENT_AMBULATORY_CARE_PROVIDER_SITE_OTHER): Payer: Medicare Other | Admitting: Emergency Medicine

## 2015-08-07 ENCOUNTER — Encounter: Payer: Self-pay | Admitting: Emergency Medicine

## 2015-08-07 VITALS — BP 120/60 | HR 58 | Ht 71.0 in | Wt 281.8 lb

## 2015-08-07 DIAGNOSIS — J438 Other emphysema: Secondary | ICD-10-CM | POA: Diagnosis not present

## 2015-08-07 DIAGNOSIS — R05 Cough: Secondary | ICD-10-CM

## 2015-08-07 DIAGNOSIS — R053 Chronic cough: Secondary | ICD-10-CM | POA: Insufficient documentation

## 2015-08-07 NOTE — Patient Instructions (Signed)
Please continue your Ruthe Mannan twice a day for now.  Use pro-Air as needed for shortness of breath We will perform pulmonary function testing at your next visit.  Walking oximetry today Follow with Dr Lamonte Sakai next available with full PFT

## 2015-08-07 NOTE — Assessment & Plan Note (Signed)
Unclear to me whether his diagnosis is asthma versus COPD. He does tell me that his breathing is improved on the dulera. I would like to obtain his PFT from [facility] and then repeat his PFT here for comparison.

## 2015-08-07 NOTE — Assessment & Plan Note (Addendum)
May be related to allergic rhinitis or obstructive lung disease. Suspect that there may also be a contribution of his ramipril. We should probably consider and discuss with Dr Marlou Porch  changing his ramipril to an ARB. I will address that with him next visit. Communication about this is somewhat limited today due to his hearing impairment. I've asked him to bring his daughter was granddaughter with him next visit and we will address this in more detail

## 2015-08-07 NOTE — Progress Notes (Signed)
Subjective:    Patient ID: Christopher Holder, male    DOB: Oct 02, 1929, 81 y.o.   MRN: [id number]  HPI 81 year old man, never smoker, with a history of CAD, hypertension and diabetes followed by Dr. Marlou Porch and Justine Null, PA. Also carries a history of COPD vs asthma in absence of tobacco use. This diagnosis was made in [place] before moving to [place]. He is on dulera, Singulair, fluticasone nasal spray. He is also on Ramipril.  He feels that the dulera helps him. He has exertional SOB, very limited walk capacity. He has cough, bothers him in the am and scattered through the day. He believes he had PFT in [place].                                                                                                                                                   Review of Systems  Constitutional: Negative for fever and unexpected weight change.  HENT: Negative for congestion, dental problem, ear pain, nosebleeds, postnasal drip, rhinorrhea, sinus pressure, sneezing, sore throat and trouble swallowing.   Eyes: Negative for redness and itching.  Respiratory: Positive for cough and shortness of breath. Negative for chest tightness and wheezing.   Cardiovascular: Negative for palpitations and leg swelling.  Gastrointestinal: Negative for nausea and vomiting.  Genitourinary: Negative for dysuria.  Musculoskeletal: Negative for joint swelling.  Skin: Negative for rash.  Neurological: Negative for headaches.  Hematological: Does not bruise/bleed easily.  Psychiatric/Behavioral: Negative for dysphoric mood. The patient is not nervous/anxious.     Past Medical History  Diagnosis Date  . Diabetes mellitus without complication ([place])   . CHF (congestive heart failure) ([place])   . Coronary artery disease   . COPD (chronic obstructive pulmonary disease) ([place])   . Asthma   . Hypertension   . HOH (hard of hearing)   . Sleep apnea   . Myocardial infarction ([place])   . Hyperlipidemia 05/27/2015  . GERD (gastroesophageal  reflux disease) 05/27/2015     Family History  Problem Relation Age of Onset  . Cancer Mother   . Diabetes Father      Social History   Social History  . Marital Status: Widowed    Spouse Name: [name]  . Number of Children: [name]  . Years of Education: [name]   Occupational History  . Not on file.   Social History Main Topics  . Smoking status: Never Smoker   . Smokeless tobacco: Never Used  . Alcohol Use: No  . Drug Use: No  . Sexual Activity: Not on file   Other Topics Concern  . Not on file   Social History Narrative     Allergies  Allergen Reactions  . Percocet [Oxycodone-Acetaminophen] Diarrhea  . Sulfa Antibiotics Diarrhea and Nausea And Vomiting     Outpatient Prescriptions Prior to Visit  Medication Sig Dispense  Refill  . albuterol (PROVENTIL HFA;VENTOLIN HFA) 108 (90 BASE) MCG/ACT inhaler Inhale into the lungs every 6 (six) hours as needed for wheezing or shortness of breath.    . allopurinol (ZYLOPRIM) 100 MG tablet Take 100 mg by mouth 2 (two) times daily.     Marland Kitchen aspirin 81 MG tablet Take 81 mg by mouth daily.    . bumetanide (BUMEX) 2 MG tablet Take 3 mg by mouth daily.     . carvedilol (COREG) 6.25 MG tablet Take 6.25 mg by mouth 2 (two) times daily with a meal.    . ciclopirox (PENLAC) 8 % solution Apply 1 application topically daily. Apply over nail and surrounding skin. Apply daily over previous coat. After seven (7) days, may remove with alcohol and continue cycle.    . fluticasone (FLONASE) 50 MCG/ACT nasal spray Place 2 sprays into both nostrils daily.    Marland Kitchen gabapentin (NEURONTIN) 600 MG tablet 1 tab po tid 90 tablet 3  . glimepiride (AMARYL) 2 MG tablet Take 2 mg by mouth daily.     . mometasone-formoterol (DULERA) 100-5 MCG/ACT AERO Inhale 2 puffs into the lungs 2 (two) times daily. 13 g 2  . montelukast (SINGULAIR) 10 MG tablet Take 1 tablet (10 mg total) by mouth at bedtime. 30 tablet 3  . NON FORMULARY CPAP at night    . omeprazole (PRILOSEC) 20 MG  capsule Take 20 mg by mouth daily.    . potassium chloride (K-DUR,KLOR-CON) 10 MEQ tablet Take 10 mEq by mouth 2 (two) times daily.    . ramipril (ALTACE) 2.5 MG capsule Take 2.5 mg by mouth daily.    . simvastatin (ZOCOR) 20 MG tablet Take 20 mg by mouth daily.     No facility-administered medications prior to visit.         Objective:   Physical Exam Filed Vitals:   08/07/15 1151  BP: 120/60  Pulse: 58  Height: 5\' 11"  (1.803 m)  Weight: 281 lb 12.8 oz (127.824 kg)  SpO2: 97%   Gen: Pleasant, overwt man, in no distress,  normal affect  ENT: No lesions,  mouth clear,  oropharynx clear, no postnasal drip, very decreased hearing  Neck: No JVD, no TMG, no carotid bruits  Lungs: No use of accessory muscles, clear without rales or rhonchi, no wheeze  Cardiovascular: RRR, heart sounds normal, no murmur or gallops, trace  peripheral edema  Musculoskeletal: No deformities, no cyanosis or clubbing  Neuro: alert, non focal  Skin: Warm, no lesions or rashes     Assessment & Plan:  Other emphysema Unclear to me whether his diagnosis is asthma versus COPD. He does tell me that his breathing is improved on the dulera. I would like to obtain his PFT from [facility] and then repeat his PFT here for comparison.   Chronic cough May be related to allergic rhinitis or obstructive lung disease. Suspect that there may also be a contribution of his ramipril. We should probably consider and discuss with Dr Marlou Porch  changing his ramipril to an ARB. I will address that with him next visit. Communication about this is somewhat limited today due to his hearing impairment. I've asked him to bring his daughter was granddaughter with him next visit and we will address this in more detail

## 2015-08-17 ENCOUNTER — Telehealth: Payer: Self-pay | Admitting: Medical

## 2015-08-17 MED ORDER — OMEPRAZOLE 20 MG PO CPDR: 20.0000 mg | DELAYED_RELEASE_CAPSULE | Freq: Every day | ORAL | Status: DC

## 2015-08-17 MED ORDER — GLIMEPIRIDE 2 MG PO TABS: 2.0000 mg | ORAL_TABLET | Freq: Every day | ORAL | Status: DC

## 2015-08-17 MED ORDER — CARVEDILOL 6.25 MG PO TABS: 6.2500 mg | ORAL_TABLET | Freq: Two times a day (BID) | ORAL | Status: DC

## 2015-08-17 NOTE — Telephone Encounter (Signed)
Pt was notified that Rx's are ready for pickup at pharmacy.

## 2015-08-17 NOTE — Telephone Encounter (Signed)
Relation to [id number] Call back [phone] Pharmacy: [id number] - [place], [place] - [address] AT [phone] (Phone) [phone] (Fax)         Reason for call:   Patient requesting a refill of the following medication: glimepiride (AMARYL) 2 MG tablet  carvedilol (COREG) 6.25 MG tablet  omeprazole (PRILOSEC) 20 MG capsule

## 2015-08-18 ENCOUNTER — Inpatient Hospital Stay (HOSPITAL_COMMUNITY)
Admission: EM | Admit: 2015-08-18 | Discharge: 2015-09-02 | DRG: 871 | Disposition: A | Discharge status: Skilled Nursing Facility | Payer: Medicare Other | Attending: Internal Medicine | Admitting: Internal Medicine

## 2015-08-18 ENCOUNTER — Emergency Department (HOSPITAL_COMMUNITY): Payer: Medicare Other

## 2015-08-18 ENCOUNTER — Inpatient Hospital Stay (HOSPITAL_COMMUNITY): Payer: Medicare Other

## 2015-08-18 ENCOUNTER — Encounter (HOSPITAL_COMMUNITY): Payer: Self-pay

## 2015-08-18 DIAGNOSIS — A419 Sepsis, unspecified organism: Secondary | ICD-10-CM | POA: Diagnosis present

## 2015-08-18 DIAGNOSIS — E785 Hyperlipidemia, unspecified: Secondary | ICD-10-CM | POA: Diagnosis present

## 2015-08-18 DIAGNOSIS — R509 Fever, unspecified: Secondary | ICD-10-CM

## 2015-08-18 DIAGNOSIS — Z955 Presence of coronary angioplasty implant and graft: Secondary | ICD-10-CM | POA: Diagnosis not present

## 2015-08-18 DIAGNOSIS — I481 Persistent atrial fibrillation: Secondary | ICD-10-CM | POA: Diagnosis present

## 2015-08-18 DIAGNOSIS — N179 Acute kidney failure, unspecified: Secondary | ICD-10-CM | POA: Diagnosis present

## 2015-08-18 DIAGNOSIS — R6521 Severe sepsis with septic shock: Secondary | ICD-10-CM | POA: Diagnosis present

## 2015-08-18 DIAGNOSIS — R0602 Shortness of breath: Secondary | ICD-10-CM

## 2015-08-18 DIAGNOSIS — E669 Obesity, unspecified: Secondary | ICD-10-CM | POA: Diagnosis present

## 2015-08-18 DIAGNOSIS — T380X5A Adverse effect of glucocorticoids and synthetic analogues, initial encounter: Secondary | ICD-10-CM | POA: Diagnosis present

## 2015-08-18 DIAGNOSIS — E1165 Type 2 diabetes mellitus with hyperglycemia: Secondary | ICD-10-CM | POA: Diagnosis present

## 2015-08-18 DIAGNOSIS — I248 Other forms of acute ischemic heart disease: Secondary | ICD-10-CM | POA: Diagnosis present

## 2015-08-18 DIAGNOSIS — E872 Acidosis: Secondary | ICD-10-CM | POA: Diagnosis present

## 2015-08-18 DIAGNOSIS — D649 Anemia, unspecified: Secondary | ICD-10-CM | POA: Diagnosis present

## 2015-08-18 DIAGNOSIS — E1140 Type 2 diabetes mellitus with diabetic neuropathy, unspecified: Secondary | ICD-10-CM | POA: Diagnosis present

## 2015-08-18 DIAGNOSIS — R6520 Severe sepsis without septic shock: Secondary | ICD-10-CM

## 2015-08-18 DIAGNOSIS — L03115 Cellulitis of right lower limb: Secondary | ICD-10-CM | POA: Diagnosis present

## 2015-08-18 DIAGNOSIS — J181 Lobar pneumonia, unspecified organism: Secondary | ICD-10-CM

## 2015-08-18 DIAGNOSIS — Z9989 Dependence on other enabling machines and devices: Secondary | ICD-10-CM

## 2015-08-18 DIAGNOSIS — G4733 Obstructive sleep apnea (adult) (pediatric): Secondary | ICD-10-CM | POA: Diagnosis present

## 2015-08-18 DIAGNOSIS — K5900 Constipation, unspecified: Secondary | ICD-10-CM | POA: Diagnosis not present

## 2015-08-18 DIAGNOSIS — J9600 Acute respiratory failure, unspecified whether with hypoxia or hypercapnia: Secondary | ICD-10-CM

## 2015-08-18 DIAGNOSIS — H9190 Unspecified hearing loss, unspecified ear: Secondary | ICD-10-CM | POA: Diagnosis present

## 2015-08-18 DIAGNOSIS — R918 Other nonspecific abnormal finding of lung field: Secondary | ICD-10-CM | POA: Diagnosis not present

## 2015-08-18 DIAGNOSIS — Z833 Family history of diabetes mellitus: Secondary | ICD-10-CM

## 2015-08-18 DIAGNOSIS — I1310 Hypertensive heart and chronic kidney disease without heart failure, with stage 1 through stage 4 chronic kidney disease, or unspecified chronic kidney disease: Secondary | ICD-10-CM | POA: Diagnosis present

## 2015-08-18 DIAGNOSIS — Z96653 Presence of artificial knee joint, bilateral: Secondary | ICD-10-CM | POA: Diagnosis present

## 2015-08-18 DIAGNOSIS — E1142 Type 2 diabetes mellitus with diabetic polyneuropathy: Secondary | ICD-10-CM | POA: Diagnosis present

## 2015-08-18 DIAGNOSIS — G4730 Sleep apnea, unspecified: Secondary | ICD-10-CM | POA: Diagnosis present

## 2015-08-18 DIAGNOSIS — R042 Hemoptysis: Secondary | ICD-10-CM

## 2015-08-18 DIAGNOSIS — I2510 Atherosclerotic heart disease of native coronary artery without angina pectoris: Secondary | ICD-10-CM | POA: Diagnosis present

## 2015-08-18 DIAGNOSIS — D696 Thrombocytopenia, unspecified: Secondary | ICD-10-CM | POA: Diagnosis present

## 2015-08-18 DIAGNOSIS — Z6837 Body mass index (BMI) 37.0-37.9, adult: Secondary | ICD-10-CM | POA: Diagnosis not present

## 2015-08-18 DIAGNOSIS — R451 Restlessness and agitation: Secondary | ICD-10-CM | POA: Diagnosis present

## 2015-08-18 DIAGNOSIS — I5023 Acute on chronic systolic (congestive) heart failure: Secondary | ICD-10-CM | POA: Insufficient documentation

## 2015-08-18 DIAGNOSIS — J45909 Unspecified asthma, uncomplicated: Secondary | ICD-10-CM | POA: Diagnosis present

## 2015-08-18 DIAGNOSIS — J189 Pneumonia, unspecified organism: Secondary | ICD-10-CM

## 2015-08-18 DIAGNOSIS — Z4659 Encounter for fitting and adjustment of other gastrointestinal appliance and device: Secondary | ICD-10-CM

## 2015-08-18 DIAGNOSIS — J80 Acute respiratory distress syndrome: Secondary | ICD-10-CM

## 2015-08-18 DIAGNOSIS — M109 Gout, unspecified: Secondary | ICD-10-CM | POA: Diagnosis present

## 2015-08-18 DIAGNOSIS — J440 Chronic obstructive pulmonary disease with acute lower respiratory infection: Secondary | ICD-10-CM | POA: Diagnosis present

## 2015-08-18 DIAGNOSIS — I252 Old myocardial infarction: Secondary | ICD-10-CM | POA: Diagnosis not present

## 2015-08-18 DIAGNOSIS — I472 Ventricular tachycardia: Secondary | ICD-10-CM | POA: Diagnosis not present

## 2015-08-18 DIAGNOSIS — I4581 Long QT syndrome: Secondary | ICD-10-CM | POA: Diagnosis present

## 2015-08-18 DIAGNOSIS — I5022 Chronic systolic (congestive) heart failure: Secondary | ICD-10-CM

## 2015-08-18 DIAGNOSIS — J810 Acute pulmonary edema: Secondary | ICD-10-CM | POA: Diagnosis not present

## 2015-08-18 DIAGNOSIS — N183 Chronic kidney disease, stage 3 (moderate): Secondary | ICD-10-CM | POA: Diagnosis present

## 2015-08-18 DIAGNOSIS — K219 Gastro-esophageal reflux disease without esophagitis: Secondary | ICD-10-CM | POA: Diagnosis present

## 2015-08-18 DIAGNOSIS — I4819 Other persistent atrial fibrillation: Secondary | ICD-10-CM | POA: Diagnosis not present

## 2015-08-18 DIAGNOSIS — Z8249 Family history of ischemic heart disease and other diseases of the circulatory system: Secondary | ICD-10-CM

## 2015-08-18 DIAGNOSIS — J9601 Acute respiratory failure with hypoxia: Secondary | ICD-10-CM | POA: Diagnosis not present

## 2015-08-18 DIAGNOSIS — E1122 Type 2 diabetes mellitus with diabetic chronic kidney disease: Secondary | ICD-10-CM | POA: Diagnosis present

## 2015-08-18 DIAGNOSIS — I5021 Acute systolic (congestive) heart failure: Secondary | ICD-10-CM | POA: Insufficient documentation

## 2015-08-18 DIAGNOSIS — R001 Bradycardia, unspecified: Secondary | ICD-10-CM | POA: Diagnosis not present

## 2015-08-18 DIAGNOSIS — I509 Heart failure, unspecified: Secondary | ICD-10-CM

## 2015-08-18 DIAGNOSIS — Z9289 Personal history of other medical treatment: Secondary | ICD-10-CM

## 2015-08-18 DIAGNOSIS — I429 Cardiomyopathy, unspecified: Secondary | ICD-10-CM | POA: Diagnosis present

## 2015-08-18 DIAGNOSIS — I10 Essential (primary) hypertension: Secondary | ICD-10-CM | POA: Diagnosis present

## 2015-08-18 DIAGNOSIS — N189 Chronic kidney disease, unspecified: Secondary | ICD-10-CM | POA: Diagnosis not present

## 2015-08-18 DIAGNOSIS — I480 Paroxysmal atrial fibrillation: Secondary | ICD-10-CM | POA: Diagnosis not present

## 2015-08-18 DIAGNOSIS — I447 Left bundle-branch block, unspecified: Secondary | ICD-10-CM | POA: Diagnosis not present

## 2015-08-18 DIAGNOSIS — R9431 Abnormal electrocardiogram [ECG] [EKG]: Secondary | ICD-10-CM | POA: Insufficient documentation

## 2015-08-18 DIAGNOSIS — G9341 Metabolic encephalopathy: Secondary | ICD-10-CM | POA: Diagnosis present

## 2015-08-18 DIAGNOSIS — I129 Hypertensive chronic kidney disease with stage 1 through stage 4 chronic kidney disease, or unspecified chronic kidney disease: Secondary | ICD-10-CM | POA: Diagnosis present

## 2015-08-18 DIAGNOSIS — E119 Type 2 diabetes mellitus without complications: Secondary | ICD-10-CM

## 2015-08-18 DIAGNOSIS — J449 Chronic obstructive pulmonary disease, unspecified: Secondary | ICD-10-CM | POA: Diagnosis not present

## 2015-08-18 HISTORY — DX: Acute kidney failure, unspecified: N17.9

## 2015-08-18 LAB — COMPREHENSIVE METABOLIC PANEL
ALBUMIN: 3 g/dL — AB (ref 3.5–5.0)
ALBUMIN: 2.7 g/dL — AB (ref 3.5–5.0)
ALK PHOS: 77 U/L (ref 38–126)
ALK PHOS: 76 U/L (ref 38–126)
ALT: 13 U/L — ABNORMAL LOW (ref 17–63)
ALT: 15 U/L — ABNORMAL LOW (ref 17–63)
ANION GAP: 11 (ref 5–15)
ANION GAP: 9 (ref 5–15)
AST: 19 U/L (ref 15–41)
AST: 21 U/L (ref 15–41)
BILIRUBIN TOTAL: 1.2 mg/dL (ref 0.3–1.2)
BUN: 25 mg/dL — ABNORMAL HIGH (ref 6–20)
BUN: 22 mg/dL — ABNORMAL HIGH (ref 6–20)
CALCIUM: 8.1 mg/dL — AB (ref 8.9–10.3)
CALCIUM: 7.8 mg/dL — AB (ref 8.9–10.3)
CO2: 22 mmol/L (ref 22–32)
CO2: 24 mmol/L (ref 22–32)
Chloride: 106 mmol/L (ref 101–111)
Chloride: 106 mmol/L (ref 101–111)
Creatinine, Ser: 1.75 mg/dL — ABNORMAL HIGH (ref 0.61–1.24)
Creatinine, Ser: 1.71 mg/dL — ABNORMAL HIGH (ref 0.61–1.24)
GFR calc Af Amer: 40 mL/min — ABNORMAL LOW (ref >60)
GFR calc non Af Amer: 34 mL/min — ABNORMAL LOW (ref >60)
GFR, EST AFRICAN AMERICAN: 41 mL/min — AB (ref >60)
GFR, EST NON AFRICAN AMERICAN: 35 mL/min — AB (ref >60)
GLUCOSE: 167 mg/dL — AB (ref 65–99)
GLUCOSE: 241 mg/dL — AB (ref 65–99)
POTASSIUM: 3.9 mmol/L (ref 3.5–5.1)
POTASSIUM: 4.2 mmol/L (ref 3.5–5.1)
SODIUM: 139 mmol/L (ref 135–145)
Sodium: 139 mmol/L (ref 135–145)
TOTAL PROTEIN: 5.9 g/dL — AB (ref 6.5–8.1)
Total Bilirubin: 1.7 mg/dL — ABNORMAL HIGH (ref 0.3–1.2)
Total Protein: 5.9 g/dL — ABNORMAL LOW (ref 6.5–8.1)

## 2015-08-18 LAB — BLOOD GAS, ARTERIAL
ACID-BASE DEFICIT: 2.3 mmol/L — AB (ref 0.0–2.0)
Acid-base deficit: 2 mmol/L (ref 0.0–2.0)
Bicarbonate: 23.2 mEq/L (ref 20.0–24.0)
Bicarbonate: 22.7 mEq/L (ref 20.0–24.0)
DRAWN BY: 129711
Drawn by: 12971
FIO2: 1
MECHANICAL RATE: 16
MECHVT: 620 mL
O2 Content: 6 L/min
O2 SAT: 81 %
O2 Saturation: 99.3 %
PATIENT TEMPERATURE: 98.6
PCO2 ART: 48.6 mmHg — AB (ref 35.0–45.0)
PCO2 ART: 46.4 mmHg — AB (ref 35.0–45.0)
PEEP: 5 cmH2O
PO2 ART: 281 mmHg — AB (ref 80.0–100.0)
Patient temperature: 102.1
RATE: 16 resp/min
TCO2: 24.7 mmol/L (ref 0–100)
TCO2: 24 mmol/L (ref 0–100)
pH, Arterial: 7.3 — ABNORMAL LOW (ref 7.350–7.450)
pH, Arterial: 7.322 — ABNORMAL LOW (ref 7.350–7.450)
pO2, Arterial: 50 mmHg — ABNORMAL LOW (ref 80.0–100.0)

## 2015-08-18 LAB — GLUCOSE, CAPILLARY
GLUCOSE-CAPILLARY: 212 mg/dL — AB (ref 65–99)
GLUCOSE-CAPILLARY: 209 mg/dL — AB (ref 65–99)
Glucose-Capillary: 139 mg/dL — ABNORMAL HIGH (ref 65–99)

## 2015-08-18 LAB — CBC WITH DIFFERENTIAL/PLATELET
Basophils Absolute: 0 10*3/uL (ref 0.0–0.1)
Basophils Absolute: 0 10*3/uL (ref 0.0–0.1)
Basophils Relative: 0 %
Basophils Relative: 0 %
EOS ABS: 0 10*3/uL (ref 0.0–0.7)
EOS PCT: 0 %
Eosinophils Absolute: 0 10*3/uL (ref 0.0–0.7)
Eosinophils Relative: 0 %
HCT: 36 % — ABNORMAL LOW (ref 39.0–52.0)
HCT: 34.7 % — ABNORMAL LOW (ref 39.0–52.0)
HEMOGLOBIN: 11.5 g/dL — AB (ref 13.0–17.0)
HEMOGLOBIN: 11.1 g/dL — AB (ref 13.0–17.0)
LYMPHS ABS: 0.9 10*3/uL (ref 0.7–4.0)
LYMPHS ABS: 0.8 10*3/uL (ref 0.7–4.0)
LYMPHS PCT: 8 %
Lymphocytes Relative: 7 %
MCH: 29.3 pg (ref 26.0–34.0)
MCH: 29.6 pg (ref 26.0–34.0)
MCHC: 31.9 g/dL (ref 30.0–36.0)
MCHC: 32 g/dL (ref 30.0–36.0)
MCV: 91.6 fL (ref 78.0–100.0)
MCV: 92.5 fL (ref 78.0–100.0)
MONO ABS: 0.3 10*3/uL (ref 0.1–1.0)
MONOS PCT: 2 %
Monocytes Absolute: 0.5 10*3/uL (ref 0.1–1.0)
Monocytes Relative: 4 %
NEUTROS ABS: 10.5 10*3/uL — AB (ref 1.7–7.7)
NEUTROS PCT: 88 %
NEUTROS PCT: 91 %
Neutro Abs: 11.5 10*3/uL — ABNORMAL HIGH (ref 1.7–7.7)
Platelets: 143 10*3/uL — ABNORMAL LOW (ref 150–400)
Platelets: 145 10*3/uL — ABNORMAL LOW (ref 150–400)
RBC: 3.93 MIL/uL — AB (ref 4.22–5.81)
RBC: 3.75 MIL/uL — ABNORMAL LOW (ref 4.22–5.81)
RDW: 15.6 % — ABNORMAL HIGH (ref 11.5–15.5)
RDW: 15.8 % — ABNORMAL HIGH (ref 11.5–15.5)
WBC: 11.9 10*3/uL — AB (ref 4.0–10.5)
WBC: 12.6 10*3/uL — ABNORMAL HIGH (ref 4.0–10.5)

## 2015-08-18 LAB — I-STAT ARTERIAL BLOOD GAS, ED
BICARBONATE: 24.2 meq/L — AB (ref 20.0–24.0)
O2 Saturation: 94 %
PCO2 ART: 38 mmHg (ref 35.0–45.0)
PH ART: 7.413 (ref 7.350–7.450)
PO2 ART: 71 mmHg — AB (ref 80.0–100.0)
TCO2: 25 mmol/L (ref 0–100)

## 2015-08-18 LAB — STREP PNEUMONIAE URINARY ANTIGEN: STREP PNEUMO URINARY ANTIGEN: NEGATIVE

## 2015-08-18 LAB — URINALYSIS, ROUTINE W REFLEX MICROSCOPIC
GLUCOSE, UA: NEGATIVE mg/dL
Ketones, ur: 15 mg/dL — AB
Nitrite: NEGATIVE
PH: 5 (ref 5.0–8.0)
Protein, ur: 30 mg/dL — AB
SPECIFIC GRAVITY, URINE: 1.024 (ref 1.005–1.030)

## 2015-08-18 LAB — CORTISOL: Cortisol, Plasma: 27.2 ug/dL

## 2015-08-18 LAB — I-STAT CG4 LACTIC ACID, ED: Lactic Acid, Venous: 2.19 mmol/L (ref 0.5–2.0)

## 2015-08-18 LAB — URINE MICROSCOPIC-ADD ON

## 2015-08-18 LAB — INFLUENZA PANEL BY PCR (TYPE A & B)
H1N1 flu by pcr: NOT DETECTED
Influenza A By PCR: NEGATIVE
Influenza B By PCR: NEGATIVE

## 2015-08-18 LAB — PROTIME-INR
INR: 1.64 — ABNORMAL HIGH (ref 0.00–1.49)
PROTHROMBIN TIME: 19.4 s — AB (ref 11.6–15.2)

## 2015-08-18 LAB — LACTIC ACID, PLASMA: LACTIC ACID, VENOUS: 1.4 mmol/L (ref 0.5–2.0)

## 2015-08-18 LAB — BRAIN NATRIURETIC PEPTIDE: B Natriuretic Peptide: 830 pg/mL — ABNORMAL HIGH (ref 0.0–100.0)

## 2015-08-18 LAB — MAGNESIUM: Magnesium: 2.2 mg/dL (ref 1.7–2.4)

## 2015-08-18 LAB — CBG MONITORING, ED
GLUCOSE-CAPILLARY: 143 mg/dL — AB (ref 65–99)
Glucose-Capillary: 167 mg/dL — ABNORMAL HIGH (ref 65–99)

## 2015-08-18 LAB — MRSA PCR SCREENING: MRSA BY PCR: NEGATIVE

## 2015-08-18 LAB — PROCALCITONIN: PROCALCITONIN: 1.81 ng/mL

## 2015-08-18 LAB — APTT: aPTT: 39 seconds — ABNORMAL HIGH (ref 24–37)

## 2015-08-18 MED ORDER — LACTATED RINGERS IV SOLN
INTRAVENOUS | Status: DC
  Administered 2015-08-18: 17:00:00 via INTRAVENOUS

## 2015-08-18 MED ORDER — ANTISEPTIC ORAL RINSE SOLUTION (CORINZ)
7.0000 mL | OROMUCOSAL | Status: DC
  Administered 2015-08-18 – 2015-08-20 (×20): 7 mL via OROMUCOSAL

## 2015-08-18 MED ORDER — SODIUM CHLORIDE 0.9 % IV SOLN
1.0000 mg/h | INTRAVENOUS | Status: DC
  Administered 2015-08-18 (×2): 1 mg/h via INTRAVENOUS
  Filled 2015-08-18: qty 10

## 2015-08-18 MED ORDER — FENTANYL CITRATE (PF) 100 MCG/2ML IJ SOLN
100.0000 ug | Freq: Once | INTRAMUSCULAR | Status: AC
  Administered 2015-08-18: 100 ug via INTRAVENOUS

## 2015-08-18 MED ORDER — IPRATROPIUM-ALBUTEROL 0.5-2.5 (3) MG/3ML IN SOLN
3.0000 mL | Freq: Four times a day (QID) | RESPIRATORY_TRACT | Status: DC
  Administered 2015-08-18 – 2015-08-21 (×13): 3 mL via RESPIRATORY_TRACT
  Filled 2015-08-18 (×14): qty 3

## 2015-08-18 MED ORDER — VITAL HIGH PROTEIN PO LIQD
1000.0000 mL | ORAL | Status: DC
  Administered 2015-08-19: 1000 mL
  Filled 2015-08-18 (×3): qty 1000

## 2015-08-18 MED ORDER — FENTANYL CITRATE (PF) 100 MCG/2ML IJ SOLN
50.0000 ug | INTRAMUSCULAR | Status: DC | PRN
  Administered 2015-08-18 – 2015-08-19 (×3): 50 ug via INTRAVENOUS
  Filled 2015-08-18 (×3): qty 2

## 2015-08-18 MED ORDER — ACETAMINOPHEN 650 MG RE SUPP
650.0000 mg | Freq: Four times a day (QID) | RECTAL | Status: DC | PRN
  Administered 2015-08-18: 650 mg via RECTAL
  Filled 2015-08-18: qty 1

## 2015-08-18 MED ORDER — SODIUM CHLORIDE 0.9 % IV BOLUS (SEPSIS)
1000.0000 mL | Freq: Once | INTRAVENOUS | Status: AC
  Administered 2015-08-18: 1000 mL via INTRAVENOUS

## 2015-08-18 MED ORDER — MONTELUKAST SODIUM 10 MG PO TABS
10.0000 mg | ORAL_TABLET | Freq: Every day | ORAL | Status: DC
  Filled 2015-08-18: qty 1

## 2015-08-18 MED ORDER — CHLORHEXIDINE GLUCONATE 0.12% ORAL RINSE (MEDLINE KIT): 15.0000 mL | Freq: Two times a day (BID) | OROMUCOSAL | Status: DC

## 2015-08-18 MED ORDER — CEFTRIAXONE SODIUM 1 G IJ SOLR
1.0000 g | INTRAMUSCULAR | Status: DC
  Administered 2015-08-18 – 2015-08-20 (×3): 1 g via INTRAVENOUS
  Filled 2015-08-18 (×4): qty 10

## 2015-08-18 MED ORDER — PROPOFOL 1000 MG/100ML IV EMUL
5.0000 ug/kg/min | INTRAVENOUS | Status: DC
  Administered 2015-08-18: 5 ug/kg/min via INTRAVENOUS
  Filled 2015-08-18: qty 100

## 2015-08-18 MED ORDER — VANCOMYCIN HCL 10 G IV SOLR
2000.0000 mg | Freq: Once | INTRAVENOUS | Status: DC
  Filled 2015-08-18: qty 2000

## 2015-08-18 MED ORDER — LACTATED RINGERS IV BOLUS (SEPSIS)
1000.0000 mL | Freq: Once | INTRAVENOUS | Status: AC
  Administered 2015-08-18: 1000 mL via INTRAVENOUS

## 2015-08-18 MED ORDER — BUDESONIDE 0.5 MG/2ML IN SUSP
0.5000 mg | Freq: Two times a day (BID) | RESPIRATORY_TRACT | Status: DC
  Administered 2015-08-18 – 2015-08-31 (×26): 0.5 mg via RESPIRATORY_TRACT
  Administered 2015-08-31: 1 mg via RESPIRATORY_TRACT
  Administered 2015-09-01 – 2015-09-02 (×3): 0.5 mg via RESPIRATORY_TRACT
  Filled 2015-08-18 (×31): qty 2

## 2015-08-18 MED ORDER — ROCURONIUM BROMIDE 50 MG/5ML IV SOLN
50.0000 mg | Freq: Once | INTRAVENOUS | Status: AC
  Administered 2015-08-18: 50 mg via INTRAVENOUS

## 2015-08-18 MED ORDER — SODIUM CHLORIDE 0.9 % IV SOLN
INTRAVENOUS | Status: DC
  Administered 2015-08-18: 12:00:00 via INTRAVENOUS

## 2015-08-18 MED ORDER — ACETAMINOPHEN 325 MG PO TABS
650.0000 mg | ORAL_TABLET | Freq: Four times a day (QID) | ORAL | Status: DC | PRN
  Administered 2015-08-21: 650 mg via ORAL
  Filled 2015-08-18: qty 2

## 2015-08-18 MED ORDER — ANTISEPTIC ORAL RINSE SOLUTION (CORINZ)
7.0000 mL | Freq: Four times a day (QID) | OROMUCOSAL | Status: DC
  Administered 2015-08-18: 7 mL via OROMUCOSAL

## 2015-08-18 MED ORDER — FLUTICASONE PROPIONATE 50 MCG/ACT NA SUSP
2.0000 | Freq: Every day | NASAL | Status: DC
  Administered 2015-08-18: 2 via NASAL
  Filled 2015-08-18: qty 16

## 2015-08-18 MED ORDER — INSULIN ASPART 100 UNIT/ML ~~LOC~~ SOLN
0.0000 [IU] | SUBCUTANEOUS | Status: DC
  Administered 2015-08-18 (×2): 3 [IU] via SUBCUTANEOUS
  Administered 2015-08-18: 1 [IU] via SUBCUTANEOUS
  Administered 2015-08-19: 2 [IU] via SUBCUTANEOUS
  Administered 2015-08-19 (×2): 1 [IU] via SUBCUTANEOUS
  Administered 2015-08-19 (×3): 2 [IU] via SUBCUTANEOUS
  Administered 2015-08-20 – 2015-08-21 (×6): 1 [IU] via SUBCUTANEOUS
  Administered 2015-08-21: 2 [IU] via SUBCUTANEOUS
  Administered 2015-08-21 – 2015-08-22 (×3): 1 [IU] via SUBCUTANEOUS
  Administered 2015-08-22 (×2): 2 [IU] via SUBCUTANEOUS
  Administered 2015-08-22 – 2015-08-23 (×4): 1 [IU] via SUBCUTANEOUS
  Filled 2015-08-18: qty 1

## 2015-08-18 MED ORDER — ETOMIDATE 2 MG/ML IV SOLN
20.0000 mg | Freq: Once | INTRAVENOUS | Status: AC
  Administered 2015-08-18: 20 mg via INTRAVENOUS

## 2015-08-18 MED ORDER — ONDANSETRON HCL 4 MG/2ML IJ SOLN: 4.0000 mg | Freq: Four times a day (QID) | INTRAMUSCULAR | Status: DC | PRN

## 2015-08-18 MED ORDER — ALBUTEROL SULFATE (2.5 MG/3ML) 0.083% IN NEBU
3.0000 mL | INHALATION_SOLUTION | Freq: Four times a day (QID) | RESPIRATORY_TRACT | Status: DC | PRN
  Administered 2015-08-23: 3 mL via RESPIRATORY_TRACT
  Filled 2015-08-18: qty 3

## 2015-08-18 MED ORDER — ASPIRIN 81 MG PO CHEW
81.0000 mg | CHEWABLE_TABLET | Freq: Every day | ORAL | Status: DC
  Administered 2015-08-18: 81 mg via ORAL
  Filled 2015-08-18: qty 1

## 2015-08-18 MED ORDER — ONDANSETRON HCL 4 MG PO TABS: 4.0000 mg | ORAL_TABLET | Freq: Four times a day (QID) | ORAL | Status: DC | PRN

## 2015-08-18 MED ORDER — FENTANYL CITRATE (PF) 100 MCG/2ML IJ SOLN
50.0000 ug | INTRAMUSCULAR | Status: DC | PRN
  Administered 2015-08-18: 50 ug via INTRAVENOUS
  Filled 2015-08-18: qty 2

## 2015-08-18 MED ORDER — ASPIRIN 81 MG PO CHEW
81.0000 mg | CHEWABLE_TABLET | Freq: Every day | ORAL | Status: DC
  Administered 2015-08-19 – 2015-08-23 (×4): 81 mg
  Filled 2015-08-18 (×4): qty 1

## 2015-08-18 MED ORDER — SODIUM CHLORIDE 0.9 % IJ SOLN
3.0000 mL | Freq: Two times a day (BID) | INTRAMUSCULAR | Status: DC
  Administered 2015-08-19: 30 mL via INTRAVENOUS
  Administered 2015-08-20: 3 mL via INTRAVENOUS
  Administered 2015-08-20: 10 mL via INTRAVENOUS
  Administered 2015-08-21 – 2015-08-25 (×7): 3 mL via INTRAVENOUS
  Administered 2015-08-25: 2 mL via INTRAVENOUS
  Administered 2015-08-26 – 2015-08-30 (×7): 3 mL via INTRAVENOUS

## 2015-08-18 MED ORDER — MIDAZOLAM HCL 2 MG/2ML IJ SOLN
2.0000 mg | Freq: Once | INTRAMUSCULAR | Status: AC
  Administered 2015-08-18: 2 mg via INTRAVENOUS

## 2015-08-18 MED ORDER — PANTOPRAZOLE SODIUM 40 MG IV SOLR
40.0000 mg | Freq: Every day | INTRAVENOUS | Status: DC
  Administered 2015-08-19 – 2015-08-20 (×2): 40 mg via INTRAVENOUS
  Filled 2015-08-18 (×2): qty 40

## 2015-08-18 MED ORDER — DEXTROSE 5 % IV SOLN
500.0000 mg | INTRAVENOUS | Status: DC
  Administered 2015-08-18 – 2015-08-20 (×3): 500 mg via INTRAVENOUS
  Filled 2015-08-18 (×4): qty 500

## 2015-08-18 MED ORDER — CHLORHEXIDINE GLUCONATE 0.12% ORAL RINSE (MEDLINE KIT)
15.0000 mL | Freq: Two times a day (BID) | OROMUCOSAL | Status: DC
  Administered 2015-08-18 – 2015-08-20 (×5): 15 mL via OROMUCOSAL

## 2015-08-18 MED ORDER — MOMETASONE FURO-FORMOTEROL FUM 200-5 MCG/ACT IN AERO
2.0000 | INHALATION_SPRAY | Freq: Two times a day (BID) | RESPIRATORY_TRACT | Status: DC
  Filled 2015-08-18: qty 8.8

## 2015-08-18 MED ORDER — ASPIRIN 81 MG PO TABS: 81.0000 mg | ORAL_TABLET | Freq: Every day | ORAL | Status: DC

## 2015-08-18 MED ORDER — SODIUM CHLORIDE 0.9 % IV BOLUS (SEPSIS): 1000.0000 mL | Freq: Once | INTRAVENOUS | Status: DC

## 2015-08-18 MED ORDER — PIPERACILLIN-TAZOBACTAM 3.375 G IVPB 30 MIN
3.3750 g | Freq: Once | INTRAVENOUS | Status: AC
  Administered 2015-08-18: 3.375 g via INTRAVENOUS
  Filled 2015-08-18: qty 50

## 2015-08-18 MED ORDER — METHYLPREDNISOLONE SODIUM SUCC 40 MG IJ SOLR
40.0000 mg | Freq: Two times a day (BID) | INTRAMUSCULAR | Status: DC
  Administered 2015-08-18: 40 mg via INTRAVENOUS
  Filled 2015-08-18: qty 1

## 2015-08-18 MED ORDER — ENOXAPARIN SODIUM 40 MG/0.4ML ~~LOC~~ SOLN
40.0000 mg | Freq: Every day | SUBCUTANEOUS | Status: DC
  Administered 2015-08-18 – 2015-08-25 (×8): 40 mg via SUBCUTANEOUS
  Filled 2015-08-18 (×9): qty 0.4

## 2015-08-18 NOTE — ED Notes (Signed)
Pt CBG, 167. Nurse was notified.

## 2015-08-18 NOTE — ED Notes (Addendum)
[facility]. Coming from home today after 2 separate falls this morning. EMS called out for first fall and came back later for second fall. Pt. Febrile on ems arrival (101.1) and hypotensive (97/37). Pt. Given 1G of tylenol en route. EMS reports pt. Had strong smelling urine and family reports diarrhea this morning. Pt. Hx of COPD/CHF. Pt. Had 168mL NS en route.Pt. Hard of hearing and disoriented at this time. Small abrasion noted on L knee from fall this morning.

## 2015-08-18 NOTE — ED Notes (Signed)
Pt. O2 sat. At 88%. Placed on 2L [place].

## 2015-08-18 NOTE — Progress Notes (Signed)
CCM notified of patient's HR sustaining in the high 40's (47-49). Patient had episode of brady to 35 was able to rebound to the mid 40's. With stimulation patients heart rate returns to the 60's and returns to the 40's at rest. Urine output has also been minimal from the beginning of shift. No new orders at this time. Will continue to monitor. Lianne Bushy RN BSN

## 2015-08-18 NOTE — Progress Notes (Signed)
[facility] Progress Note Patient Name: Christopher Holder DOB: [birth year] MRN: [id number]   Date of Service  08/18/2015  HPI/Events of Note  Not able to increase the Propofol IV infusion d/t soft BP (106/45).  eICU Interventions  Will order: 1. Versed IV infusion. Titrate to RASS 0 to -1.  2. D/C Propofol IV infusion.      Intervention Category Intermediate Interventions: Hypotension - evaluation and management Minor Interventions: Agitation / anxiety - evaluation and management  Sommer,Steven Eugene 08/18/2015, 5:43 PM

## 2015-08-18 NOTE — Significant Event (Signed)
Rapid Response Event Note  Overview: Time Called: 1450 Arrival Time: [id number] Event Type: Respiratory  Initial Focused Assessment: Patient in acute respiratory distress.  Lungs with rhonchi.  RR 30s. O2 sats 80s-90s, patient is very pale and restless. Coughing up dark secretions  Interventions: ABG done Placed on 100% NRB Patient improved with increased O2, but still with tachypnea and SOB.  He is able to briefly answer a few questions.   Dr Nelda Marseille and Laurey Arrow at bedside Transferred to 3M11 Urgently Intubated and Central line placed.   Event Summary: Name of Physician Notified: Relagado prior to my arrival at    Name of Consulting Physician Notified: Yacoub at 1455  Outcome: Transferred (Comment) (61m11)  Event End Time: [place]  Raliegh Ip

## 2015-08-18 NOTE — Progress Notes (Signed)
Pt noted to have decreased O2 sats. Upon assessment of patient, oxygen probe not be picking up accurately. NT to bedside to change out probe. During this encounter, Dr. Tyrell Antonio and Lurena Nida, NP up on floor to re-assess patient. Respiratory therapist at bedside to administer breathing treatment, along with MD and NP.  Pt continued to rapidly decline  in which Rapid Response RN was called to bedside. NRB placed. Order received to transfer patient to ICU. PCCM called to bedside. Report called to Marlowe Kays, RN on [place]. Patient transported via bed with respiratory, rapid response RN and PCCM to 51M11. Unable to reach patient's daughter Chong Sicilian, but able to reach patient's granddaughter Janett Billow who was updated on plan of care and patient's transfer to 51M.

## 2015-08-18 NOTE — Plan of Care (Signed)
Problem: Education: Goal: Knowledge of [place] [facility] Education information/materials will improve Outcome: Completed/Met Date Met:  08/18/15 Pt and family educated  Problem: Safety: Goal: Ability to remain free from injury will improve Outcome: Completed/Met Date Met:  08/18/15 Pt and family educated

## 2015-08-18 NOTE — H&P (Signed)
CTSP regarding increasing congestion and increased work of breathing. Upon examination of patient although he was denying difficulty breathing he was complaining of increased secretions and excessive coughing. Pulse oximetry not reading correctly but patient clearly appear to be in moderate to severe respiratory distress. Plans were to obtain ABG and place on BiPAP but because excessive secretions could not accomplish this. PCCM notified of concerns that patient may require intubation and or at least transfer to ICU and requested stat consultation which was accomplished. ABG returned with worsened hypoxemia with PO2 now down to 51. Patient with subsequent transfer to ICU and PCCM assumed care. Attempted to call patient daughter that work number provided but no response.  Erin Hearing, ANP

## 2015-08-18 NOTE — Progress Notes (Addendum)
Additional history obtained from patient's daughter Devontez Ascher. Family went to the beach over the holiday weekend and returned on Monday. At that time patient was not feeling well and reporting feeling chilled. By yesterday Tuesday patient had a temperature of 101. Patient was treating symptoms with over-the-counter medications but due to changes today and falls he was brought to the hospital. Confirm patient is a full code. Patient lives with granddaughter. ABG with normal PCO2 but PaO2 71 on oxygen.  Erin Hearing ANP

## 2015-08-18 NOTE — ED Notes (Signed)
Pt's O2 remained at 88%, placed pt on nasal cannula 2L.

## 2015-08-18 NOTE — H&P (Signed)
[facility] History and Physical                                                                                    Christopher Holder, is a 81 y.o. male  MRN: [id number]   DOB - [birth year]  Admit Date - 08/18/2015  Outpatient Primary MD for the patient is [name], [name]  Referring MD: Laneta Simmers / ER  With History of -  Past Medical History  Diagnosis Date  . Diabetes mellitus without complication ([place])   . CHF (congestive heart failure) ([place])   . Coronary artery disease   . COPD (chronic obstructive pulmonary disease) ([place])   . Asthma   . Hypertension   . HOH (hard of hearing)   . Sleep apnea   . Myocardial infarction ([place])   . Hyperlipidemia 05/27/2015  . GERD (gastroesophageal reflux disease) 05/27/2015  . AKI (acute kidney injury) ([place]) 08/18/2015      Past Surgical History  Procedure Laterality Date  . Coronary angioplasty with stent placement    . Knee surgery    . Knee surgery      Knee replacement x2    in for   Chief Complaint  Patient presents with  . Fever     HPI This is an 81 year old gentleman recently moved here from [place], has a past medical history of CAD followed by Dr. Marlou Porch and history of asthma versus COPD currently being worked up by Dr. Lamonte Sakai of pulmonology; he also has history of diabetes on oral medications, sleep apnea on Nocturnal CPAP, hypertension, diabetic neuropathy, obesity, remote CAD and dyslipidemia. He is also very hard of hearing and at baseline without hearing aids has 2% hearing with limited improvement of his hearing with hearing aids in place. She presented to the ER after 2 separate falls this morning. Apparently after the first fall EMS was called out but he did not apparently meet requirements for coming to the hospital or he refused. He called them back later after second fall. Upon EMS arrival patient was febrile with a temperature of 101.1 orally and relative hypotension with a BP of 97/37. Patient was given a gram of  Tylenol and transported to the hospital. According to the patient's family he had been having diarrhea this morning and had strong smelling urine. She was given normal saline fluids and was somewhat disoriented and noted to be significantly hard of hearing. He was also found to have a small abrasion on his left knee. Obtaining history from the patient is quite difficult giving his hearing issues. He reports transient lower abdominal pain that initially denied to me having any diarrhea. He has a chronic cough which has changed become increased in frequency and more wet and productive now with rest colored sputum. He is unclear as to when his fever started. He denies emesis.  In the ER patient's rectal temperature was 102.4, respirations were 25, pulse was 60 and he was in sinus rhythm, BP was 92/44, O2 sats were 88% on room air so oxygen was applied and saturations of subsequently increased to 97%. Just on clinical exam he appeared to be septic so sepsis protocol was initiated. Routine  labs including blood cultures were obtained. Because of his relative hypotension fluid volume resuscitation was initiated and patient has subsequently received 3 L of fluid in the ER. Actiq acid was elevated at 2.19, patient appeared to have acute renal insufficiency with a BUN of 25 and a creatinine of 1.75 noting a previous baseline in September 2016 of BUN 30 and creatinine 1.48. He had leukocytosis 11,900 with neutrophils 88% and absolute neutrophils 10.5%, he had thrombocytopenia which is new with platelets 143,000, albumin was low at 3, total bilirubin was mildly elevated at 1.7 with normal AST and ALT, glucose was slightly elevated 167. Again during my evaluation of the patient there was no family at the bedside to assist with history   Review of Systems   In addition to the HPI above,  No chills, myalgias or other constitutional symptoms No Headache, changes with Vision or hearing, new weakness, tingling, numbness in  any extremity, No problems swallowing food or Liquids, indigestion/reflux No Chest pain, Shortness of Breath, palpitations, orthopnea or DOE No N/V; no melena or hematochezia, no dark tarry stools No dysuria, hematuria or flank pain No new skin rashes, lesions, masses or bruises, No new joints pains-aches No recent weight gain or loss No polyuria, polydypsia or polyphagia,  *A full 10 point Review of Systems was done, except as stated above, all other Review of Systems were negative.  Social History Social History  Substance Use Topics  . Smoking status: Never Smoker   . Smokeless tobacco: Never Used  . Alcohol Use: No    Resides at: Private residence  Lives with: Unclear at this juncture-seems to have family members living with him based on nursing documentation from EMS counter earlier today  Ambulatory status: Apparently without assistive devices although did have some falling earlier today   Family History Family History  Problem Relation Age of Onset  . Cancer Mother   . Diabetes Father      Prior to Admission medications   Medication Sig Start Date End Date Taking? Authorizing Provider  albuterol (PROVENTIL HFA;VENTOLIN HFA) 108 (90 BASE) MCG/ACT inhaler Inhale into the lungs every 6 (six) hours as needed for wheezing or shortness of breath.    Historical Provider, MD  allopurinol (ZYLOPRIM) 100 MG tablet Take 100 mg by mouth 2 (two) times daily.     Historical Provider, MD  aspirin 81 MG tablet Take 81 mg by mouth daily.    Historical Provider, MD  bumetanide (BUMEX) 2 MG tablet Take 3 mg by mouth daily.     Historical Provider, MD  carvedilol (COREG) 6.25 MG tablet Take 1 tablet (6.25 mg total) by mouth 2 (two) times daily with a meal. 08/17/15   Mackie Pai, PA-C  ciclopirox (PENLAC) 8 % solution Apply 1 application topically daily. Apply over nail and surrounding skin. Apply daily over previous coat. After seven (7) days, may remove with alcohol and continue  cycle.    Historical Provider, MD  fluticasone (FLONASE) 50 MCG/ACT nasal spray Place 2 sprays into both nostrils daily.    Historical Provider, MD  gabapentin (NEURONTIN) 600 MG tablet 1 tab po tid 07/21/15   Mackie Pai, PA-C  glimepiride (AMARYL) 2 MG tablet Take 1 tablet (2 mg total) by mouth daily. 08/17/15   Percell Miller [name], PA-C  mometasone-formoterol (DULERA) 100-5 MCG/ACT AERO Inhale 2 puffs into the lungs 2 (two) times daily. 06/26/15   Edward [name], PA-C  montelukast (SINGULAIR) 10 MG tablet Take 1 tablet (10 mg total) by mouth at  bedtime. 07/21/15   Edward [name], PA-C  NON FORMULARY CPAP at night    Historical Provider, MD  omeprazole (PRILOSEC) 20 MG capsule Take 1 capsule (20 mg total) by mouth daily. 08/17/15   Percell Miller [name], PA-C  potassium chloride (K-DUR,KLOR-CON) 10 MEQ tablet Take 10 mEq by mouth 2 (two) times daily.    Historical Provider, MD  ramipril (ALTACE) 2.5 MG capsule Take 2.5 mg by mouth daily.    Historical Provider, MD  simvastatin (ZOCOR) 20 MG tablet Take 20 mg by mouth daily.    Historical Provider, MD    Allergies  Allergen Reactions  . Percocet [Oxycodone-Acetaminophen] Diarrhea  . Sulfa Antibiotics Diarrhea and Nausea And Vomiting    Physical Exam  Vitals  Blood pressure 113/47, pulse 57, temperature 102.4 F (39.1 C), temperature source Rectal, resp. rate 22, height 6' (1.829 m), weight 274 lb 6.4 oz (124.467 kg), SpO2 97 %.   General:  In moderate distress as evidenced by ongoing lethargy and persistent cough  Psych: Lethargic affect, history somewhat limited 2/2 patient's profound hearing loss  Neuro:   No focal neurological deficits, CN II through XII intact except for known profound hearing loss, Strength 4/5 all 4 extremities, Sensation intact all 4 extremities.  ENT:  Ears and Eyes appear Normal, Conjunctivae clear, PER. E dry oral mucosa without erythema or exudates.  Neck:  Supple, No lymphadenopathy appreciated  Respiratory:   Symmetrical chest wall movement, Good air movement bilaterally, coarse to auscultation. 2L  Cardiac:  RRR sinus bradycardia ventricular rate 54, No Murmurs, no LE edema noted, no JVD, No carotid bruits, peripheral pulses palpable at 2+  Abdomen:  Positive bowel sounds, Soft, Non tender, Non distended,  No masses appreciated, no obvious hepatosplenomegaly  Skin:  No Cyanosis, Normal Skin Turgor, No Skin Rash or Bruise.  Extremities: Symmetrical without obvious trauma or injury,  no effusions.  Data Review  CBC  Recent Labs Lab 08/18/15 0838  WBC 11.9*  HGB 11.5*  HCT 36.0*  PLT 143*  MCV 91.6  MCH 29.3  MCHC 31.9  RDW 15.6*  LYMPHSABS 0.9  MONOABS 0.5  EOSABS 0.0  BASOSABS 0.0    Chemistries   Recent Labs Lab 08/18/15 0838  NA 139  K 3.9  CL 106  CO2 22  GLUCOSE 167*  BUN 25*  CREATININE 1.75*  CALCIUM 8.1*  AST 19  ALT 13*  ALKPHOS 77  BILITOT 1.7*    estimated creatinine clearance is 43.6 mL/min (by C-G formula based on Cr of 1.75).  No results for input(s): TSH, T4TOTAL, T3FREE, THYROIDAB in the last 72 hours.  Invalid input(s): FREET3  Coagulation profile No results for input(s): INR, PROTIME in the last 168 hours.  No results for input(s): DDIMER in the last 72 hours.  Cardiac Enzymes No results for input(s): CKMB, TROPONINI, MYOGLOBIN in the last 168 hours.  Invalid input(s): CK  Invalid input(s): POCBNP  Urinalysis    Component Value Date/Time   COLORURINE AMBER* 08/18/2015 0838   APPEARANCEUR CLOUDY* 08/18/2015 0838   LABSPEC 1.024 08/18/2015 0838   PHURINE 5.0 08/18/2015 0838   GLUCOSEU NEGATIVE 08/18/2015 0838   HGBUR MODERATE* 08/18/2015 0838   BILIRUBINUR SMALL* 08/18/2015 0838   KETONESUR 15* 08/18/2015 0838   PROTEINUR 30* 08/18/2015 0838   NITRITE NEGATIVE 08/18/2015 0838   LEUKOCYTESUR TRACE* 08/18/2015 0838    Imaging results:   Dg Chest Port 1 View  08/18/2015  CLINICAL DATA:  Fever.  Multiple recent falls. EXAM:  PORTABLE CHEST  1 VIEW COMPARISON:  None. FINDINGS: There is moderate interstitial edema with cardiomegaly and pulmonary venous hypertension. The small left effusion. There is no airspace consolidation. No adenopathy. No pneumothorax. No bone lesions. IMPRESSION: Congestive heart failure. No airspace consolidation. No pneumothorax. Electronically Signed   By: Lowella Grip III M.D.   On: 08/18/2015 09:32      Assessment & Plan  Principal Problem:   Sepsis due to pneumonia/Acute respiratory failure with hypoxia/known OSA/obesity -Step down -Supportive care with oxygen -Check influenza panel -Rocephin and Zithromax -Sputum culture -Check HIV, blood culture, Legionella and strep -ABG -Nocturnal CPAP -Relative hypotension given baseline blood pressure 120/60; continue volume resuscitation -Hold preadmission carvedilol and Altace -Continue Singulair, preadmission MDIs -Check Procalcitonin and cycle lactic acid; current lactic acid greater than 2 but less than 4  Active Problems:   AKI  -Continue hydration -Hold ACE inhibitor and diuretic    Metabolic encephalopathy -Secondary to fever and acute infectious process -Hold preadmission gabapentin for now -NPO until more fully alert    Thrombocytopenia  -New finding and suspect marker of sepsis evolution/hypoperfusion -Follow labs -Platelets greater than 100,000    Diabetes mellitus type 2, controlled /diabetic neuropathy -CBGs controlled -Hold Amaryl -SSI    HTN  -Relative hypotension in setting of sepsis -Hold preadmission antihypertensives and diuretics -Question of prior medical history of CHF so will check echocardiogram this admission    Chronic sinus bradycardia -On low-dose chronic carvedilol which is on hold secondary to above issues    Hyperlipidemia -Continue statin    Coronary artery disease involving native coronary artery of native heart without angina pectoris -Remote history of MI 14 years prior status  post stent -Currently asymptomatic and last evaluated by cardiology as an outpatient on 9/30    Hard of hearing -Makes it very difficult to communicate with the patient    DVT Prophylaxis: Lovenox  Family Communication:   No family at bedside-have obtained number of granddaughter who lives locally and well call her later today to discuss patient's status  Code Status:  Full code  Condition:  Guarded  Discharge disposition: Once medically stable pending PT/OT evaluations anticipate likely to return to home environment  Time spent in minutes : 60      Jordanny Waddington L. ANP on 08/18/2015 at 11:37 AM  Between 7am to 7pm - Pager - [phone]  After 7pm go to [email or web address] - password [email or web address]  And look for the night coverage person covering me after hours  [facility]

## 2015-08-18 NOTE — Progress Notes (Signed)
[facility] Progress Note Patient Name: Jurrell Kintner DOB: 1930-04-06 MRN: [id number]   Date of Service  08/18/2015  HPI/Events of Note  Agitation. Intubated and mechanically ventilated. Fentanyl IV PRN not effective. BP = 117/53.  eICU Interventions  Will order a Propofol IV infusion. Titrate to RASS = -1 to -2.     Intervention Category Minor Interventions: Agitation / anxiety - evaluation and management  Cyle Kenyon Eugene 08/18/2015, 4:45 PM

## 2015-08-18 NOTE — Procedures (Signed)
Intubation Procedure Note Raffi Slisz [id number] May 15, 1930  Procedure: Intubation Indications: Respiratory insufficiency  Procedure Details Consent: Risks of procedure as well as the alternatives and risks of each were explained to the (patient/caregiver).  Consent for procedure obtained. Time Out: Verified patient identification, verified procedure, site/side was marked, verified correct patient position, special equipment/implants available, medications/allergies/relevent history reviewed, required imaging and test results available.  Performed  Maximum sterile technique was used including gloves, hand hygiene and mask.  MAC    Evaluation Hemodynamic Status: BP stable throughout; O2 sats: stable throughout Patient's Current Condition: stable Complications: No apparent complications Patient did tolerate procedure well. Chest X-ray ordered to verify placement.  CXR: pending.   YACOUB,WESAM 08/18/2015

## 2015-08-18 NOTE — Consult Note (Signed)
PULMONARY / CRITICAL CARE MEDICINE   Name: Rawson Rupinski MRN: [id number] DOB: October 27, 1929    ADMISSION DATE:  08/18/2015 CONSULTATION DATE:  11/29 REFERRING MD :  Tyrell Antonio   CHIEF COMPLAINT:  Acute respiratory failure   HISTORY OF PRESENT ILLNESS:   81 year old gentleman recently moved here from [place], has a past medical history of CAD followed by Dr. Marlou Porch and history of asthma versus COPD currently being worked up by Dr. Lamonte Sakai  Also has h/o diabetes on oral medications, sleep apnea on Nocturnal CPAP, hypertension, diabetic neuropathy, obesity, remote CAD and dyslipidemia. Presents to the ER on 11/29 after 2 separate falls that morning. Upon EMS arrival patient was febrile with a temperature of 101.1 orally and relative hypotension with a BP of 97/37. Patient was given a gram of Tylenol and transported to the hospital. According to the patient's family he had been having diarrhea that morning and had strong smelling urine. In the ER he was given normal saline fluids and was somewhat disoriented and noted to be significantly hard of hearing. He was also found to have a small abrasion on his left knee. In the ER patient's rectal temperature was 102.4, respirations were 25, pulse was 60 and he was in sinus rhythm, BP was 92/44, O2 sats were 88% on room air so oxygen was applied and saturations of subsequently increased to 97%. He  received 3 L of fluid in the ER. He was admitted to the SDU w/ working dx of CAP and sepsis. Not long after arrival to the SDU he was found more confused and hypoxic w/ O2 sats in low 80s on 4 liters. PCCM was asked to eval given concern for impending respiratory arrest.   PAST MEDICAL HISTORY :  He  has a past medical history of Diabetes mellitus without complication ([place]); CHF (congestive heart failure) ([place]); Coronary artery disease; COPD (chronic obstructive pulmonary disease) ([place]); Asthma; Hypertension; HOH (hard of hearing); Sleep apnea; Myocardial infarction ([place]);  Hyperlipidemia (05/27/2015); GERD (gastroesophageal reflux disease) (05/27/2015); and AKI (acute kidney injury) ([place]) (08/18/2015).  PAST SURGICAL HISTORY: He  has past surgical history that includes Coronary angioplasty with stent; Knee surgery; and Knee surgery.  Allergies  Allergen Reactions  . Percocet [Oxycodone-Acetaminophen] Diarrhea  . Sulfa Antibiotics Diarrhea and Nausea And Vomiting    No current facility-administered medications on file prior to encounter.   Current Outpatient Prescriptions on File Prior to Encounter  Medication Sig  . NON FORMULARY Take 15 each by mouth Nightly. CPAP at night  . albuterol (PROVENTIL HFA;VENTOLIN HFA) 108 (90 BASE) MCG/ACT inhaler Inhale 2 puffs into the lungs every 4 (four) hours as needed for wheezing or shortness of breath.   . allopurinol (ZYLOPRIM) 100 MG tablet Take 100 mg by mouth 2 (two) times daily.   Marland Kitchen aspirin 81 MG tablet Take 81 mg by mouth daily.  . bumetanide (BUMEX) 2 MG tablet Take 3 mg by mouth 2 (two) times daily.   . carvedilol (COREG) 6.25 MG tablet Take 1 tablet (6.25 mg total) by mouth 2 (two) times daily with a meal.  . fluticasone (FLONASE) 50 MCG/ACT nasal spray Place 2 sprays into both nostrils daily.  Marland Kitchen gabapentin (NEURONTIN) 600 MG tablet 1 tab po tid (Patient taking differently: Take 600 mg by mouth 3 (three) times daily. )  . glimepiride (AMARYL) 2 MG tablet Take 1 tablet (2 mg total) by mouth daily.  . mometasone-formoterol (DULERA) 100-5 MCG/ACT AERO Inhale 2 puffs into the lungs 2 (two) times daily. (Patient  not taking: Reported on 08/18/2015)  . montelukast (SINGULAIR) 10 MG tablet Take 1 tablet (10 mg total) by mouth at bedtime.  Marland Kitchen omeprazole (PRILOSEC) 20 MG capsule Take 1 capsule (20 mg total) by mouth daily.  . potassium chloride (K-DUR,KLOR-CON) 10 MEQ tablet Take 10 mEq by mouth 2 (two) times daily.  . ramipril (ALTACE) 2.5 MG capsule Take 2.5 mg by mouth daily.  . simvastatin (ZOCOR) 20 MG tablet Take 20  mg by mouth daily.    FAMILY HISTORY:  His indicated that his mother is deceased. He indicated that his father is deceased. He indicated that his sister is alive. He indicated that his maternal grandmother is deceased. He indicated that his maternal grandfather is deceased. He indicated that his paternal grandmother is deceased. He indicated that his paternal grandfather is deceased. He indicated that his daughter is alive.   SOCIAL HISTORY: He  reports that he has never smoked. He has never used smokeless tobacco. He reports that he does not drink alcohol or use illicit drugs.  REVIEW OF SYSTEMS:   Unable   SUBJECTIVE:  Appears critically ill  VITAL SIGNS: BP 117/53 mmHg  Pulse 60  Temp(Src) 99.3 F (37.4 C) (Oral)  Resp 24  Ht 6' (1.829 m)  Wt 124.467 kg (274 lb 6.4 oz)  BMI 37.21 kg/m2  SpO2 97%  HEMODYNAMICS:    VENTILATOR SETTINGS: Vent Mode:  [-]  FiO2 (%):  [100 %] 100 %  INTAKE / OUTPUT:    PHYSICAL EXAMINATION: General:  81 year old male, lethargic, appears toxic.  Neuro:  Awakens w/ stimulation, moves all ext, generalized weakness, oriented X 2 HEENT:  NCAT, no JVD Cardiovascular:  rrr w/out MRG Lungs:  Diffuse rhonchi, + accessory muscle use  Abdomen:  Obese + bowel sounds  Musculoskeletal:  Good bulk, Equal st  Skin:  Warm and dry   LABS:  CBC  Recent Labs Lab 08/18/15 0838  WBC 11.9*  HGB 11.5*  HCT 36.0*  PLT 143*   Coag's No results for input(s): APTT, INR in the last 168 hours. BMET  Recent Labs Lab 08/18/15 0838  NA 139  K 3.9  CL 106  CO2 22  BUN 25*  CREATININE 1.75*  GLUCOSE 167*   Electrolytes  Recent Labs Lab 08/18/15 0838  CALCIUM 8.1*  MG 2.2   Sepsis Markers  Recent Labs Lab 08/18/15 0852  LATICACIDVEN 2.19*   ABG  Recent Labs Lab 08/18/15 1210 08/18/15 1450  PHART 7.413 7.300*  PCO2ART 38.0 48.6*  PO2ART 71.0* 50.0*   Liver Enzymes  Recent Labs Lab 08/18/15 0838  AST 19  ALT 13*  ALKPHOS  77  BILITOT 1.7*  ALBUMIN 3.0*   Cardiac Enzymes No results for input(s): TROPONINI, PROBNP in the last 168 hours. Glucose  Recent Labs Lab 08/18/15 0830 08/18/15 1200 08/18/15 1330  GLUCAP 167* 143* 139*    Imaging Dg Chest Port 1 View  08/18/2015  CLINICAL DATA:  Fever.  Multiple recent falls. EXAM: PORTABLE CHEST 1 VIEW COMPARISON:  None. FINDINGS: There is moderate interstitial edema with cardiomegaly and pulmonary venous hypertension. The small left effusion. There is no airspace consolidation. No adenopathy. No pneumothorax. No bone lesions. IMPRESSION: Congestive heart failure. No airspace consolidation. No pneumothorax. Electronically Signed   By: Lowella Grip III M.D.   On: 08/18/2015 09:32  worsening R>L airspace disease    STUDIES:  ECHO 11/29>>>  CULTURES: Sputum 11/29>>> BCX2 11/29>>> U strep 11/29>>> U legionella 11/29>> Influenza panel 11/29:  neg   ANTIBIOTICS: Rocephin 11/29>>> azithro 11/29>>> vanc 11/29>>>  SIGNIFICANT EVENTS:   LINES/TUBES: OETT 11/29>>>  DISCUSSION: 45 yowm w/ complicated medical hx including CHF, prob COPD and OSA. Admitted 11/28 w/ CAP and Sepsis. Was volume resuscitated, abx initiated and admitted to the SDU. Shortly after admission developed progressive respiratory distress w/ copious sputum production. Moved to the ICU for progressive respiratory failure. Suspect that this is a mix of progressive ALI vs Pulmonary edema vs mix of the three. Now intubated. Will add vanc to CAP coverage and check CVP. Will eventually need diuresis if BP remains stable.   ASSESSMENT / PLAN:  PULMONARY A: Acute hypoxic respiratory failure in setting of CAP c/b either evolving ALI vs pulmonary edema vs mix of both  >thick purulent mucous will not allow for BIPAP P:   Intubate Full vent support  PAD protocol See ID section   CARDIOVASCULAR A:  Severe sepsis.  H/o CHF r/o pulmonary edema  P:  Will need central access to assess  CVP Goal for Euvolemia for now; IF bp stable can assess for diuresis  Cycle CEs 12 lead   RENAL A:   AKI Lactic acidosis (mild) Now s/p volume resuscitation  P:   Renal dose meds Strict I&O F/u chemistry in am  GASTROINTESTINAL A:   Obesity  H/o GERD P:   PPI Start tubefeeds per protocol   HEMATOLOGIC A:   Anemia  Mild thrombocytopenia  P:  LMWH Trend cbc Transfuse per typical protocol   INFECTIOUS A:   Probable CAP Sepsis  P:   BCX2 11/29>>> Sputum 11/29>>> U strep 11/29>>> U legionella 11/29>>>  Rocephin 11/29>>> azitho 11/29>>> vanc 11/29>>>  ENDOCRINE A:   DM w/ hyperglycemia  P:   ssi protocol   NEUROLOGIC A:   Acute Encephalopathy in setting of sepsis and hypoxia P:   RASS goal: -2 PAD protocol    FAMILY  - Updates:   - Inter-disciplinary family meet or Palliative Care meeting due by:  12/6  Erick Colace ACNP-BC [place] Pager # [phone] OR # [phone] if no answer  Attending Note:  81 year old male with CHF history presenting to the ED with SOB and fever. Noted to have a pneumonia and borderline BP. Patient was given a total of 3L NS subsequently started coughing up very thick sputum and developed hypoxemic respiratory failure and PCCM was called on consultation for intubation and transfer to the ICU. On exam, decreased BS and crackles diffusely. Patient is confused. Obvious respiratory distress. Will move patient to the ICU and intubate. Broaden abx spectrum. Hold diureses for now. KVO IVF. Check CVP. Check ABG and CXR now and will adjust vent accordingly. Attempted on 2 different occassions to reach daughter with little success. Will have to place central line emergently as patient's BP dropped to the 80's. Stabilize and f/u on cultures.  The patient is critically ill with multiple organ systems failure and requires high complexity decision making for assessment and support, frequent evaluation and  titration of therapies, application of advanced monitoring technologies and extensive interpretation of multiple databases.   Critical Care Time devoted to patient care services described in this note is 35 Minutes. This time reflects time of care of this signee Dr Jennet Maduro. This critical care time does not reflect procedure time, or teaching time or supervisory time of PA/NP/Med student/Med Resident etc but could involve care discussion time.  Rush Farmer, M.D. [facility] Pulmonary/Critical Care Medicine. Pager: [phone]. After hours  pager: [phone].  08/18/2015, 3:19 PM

## 2015-08-18 NOTE — Progress Notes (Signed)
Upon arrival to room, patient noted to have increased respirations, using accessory muscles to breathe. ED RN at bedside with this RN, stated this isn't new for patient. Pt's O2 sats 95-97% on 2L [place], lungs diminished at bases. Pt with IVF running at 100cc/hr, along with IV abx at 250cc/hr. Dr. Tyrell Antonio paged regarding patient's respiratory status. CXR results and EF % also noted during this conversation. Order received to d/c IVF, give IV solumedrol and nebs. Orders carried out immediately. Patient's granddaughter, Christopher Holder, at bedside and updated with plan of care. Will continue to monitor patient closely.

## 2015-08-18 NOTE — Procedures (Signed)
Central Venous Catheter Insertion Procedure Note Christopher Holder [id number] 08-21-1930  Procedure: Insertion of Central Venous Catheter Indications: Assessment of intravascular volume, Drug and/or fluid administration and Frequent blood sampling  Procedure Details Consent: Unable to obtain consent because of emergent medical necessity. Time Out: Verified patient identification, verified procedure, site/side was marked, verified correct patient position, special equipment/implants available, medications/allergies/relevent history reviewed, required imaging and test results available.  Performed  Maximum sterile technique was used including antiseptics, cap, gloves, gown, hand hygiene, mask and sheet. Skin prep: Chlorhexidine; local anesthetic administered A antimicrobial bonded/coated triple lumen catheter was placed in the left subclavian vein using the Seldinger technique.  Evaluation Blood flow good Complications: No apparent complications Patient did tolerate procedure well. Chest X-ray ordered to verify placement.  CXR: pending.  U/S used in placement.  Christopher Holder 08/18/2015, 3:53 PM

## 2015-08-18 NOTE — ED Notes (Signed)
MD at bedside. 

## 2015-08-18 NOTE — ED Provider Notes (Signed)
CSN: [id number]     Arrival date & time 08/18/15  0818 History   First MD Initiated Contact with Patient 08/18/15 0825     Chief Complaint  Patient presents with  . Fever     (Consider location/radiation/quality/duration/timing/severity/associated sxs/prior Treatment) Patient is a 81 y.o. male presenting with altered mental status. The history is provided by the patient and a relative.  Altered Mental Status Presenting symptoms: confusion, disorientation and lethargy   Severity:  Moderate Most recent episode:  Today Episode history:  Continuous Duration:  2 days Timing:  Constant Progression:  Worsening Chronicity:  New Context comment:  Fever, cough, rust colored sputum Associated symptoms: bladder incontinence (with foul smelling urine) and fever   Associated symptoms: no abdominal pain, no hallucinations, no nausea and no vomiting     Past Medical History  Diagnosis Date  . Diabetes mellitus without complication ([place])   . CHF (congestive heart failure) ([place])   . Coronary artery disease   . COPD (chronic obstructive pulmonary disease) ([place])   . Asthma   . Hypertension   . HOH (hard of hearing)   . Sleep apnea   . Myocardial infarction ([place])   . Hyperlipidemia 05/27/2015  . GERD (gastroesophageal reflux disease) 05/27/2015   Past Surgical History  Procedure Laterality Date  . Coronary angioplasty with stent placement    . Knee surgery    . Knee surgery      Knee replacement x2   Family History  Problem Relation Age of Onset  . Cancer Mother   . Diabetes Father    Social History  Substance Use Topics  . Smoking status: Never Smoker   . Smokeless tobacco: Never Used  . Alcohol Use: No    Review of Systems  Constitutional: Positive for fever.  Gastrointestinal: Negative for nausea, vomiting and abdominal pain.  Genitourinary: Positive for bladder incontinence (with foul smelling urine).  Psychiatric/Behavioral: Positive for confusion. Negative for  hallucinations.  All other systems reviewed and are negative.     Allergies  Percocet and Sulfa antibiotics  Home Medications   Prior to Admission medications   Medication Sig Start Date End Date Taking? Authorizing Provider  albuterol (PROVENTIL HFA;VENTOLIN HFA) 108 (90 BASE) MCG/ACT inhaler Inhale into the lungs every 6 (six) hours as needed for wheezing or shortness of breath.    Historical Provider, MD  allopurinol (ZYLOPRIM) 100 MG tablet Take 100 mg by mouth 2 (two) times daily.     Historical Provider, MD  aspirin 81 MG tablet Take 81 mg by mouth daily.    Historical Provider, MD  bumetanide (BUMEX) 2 MG tablet Take 3 mg by mouth daily.     Historical Provider, MD  carvedilol (COREG) 6.25 MG tablet Take 1 tablet (6.25 mg total) by mouth 2 (two) times daily with a meal. 08/17/15   Mackie Pai, PA-C  ciclopirox (PENLAC) 8 % solution Apply 1 application topically daily. Apply over nail and surrounding skin. Apply daily over previous coat. After seven (7) days, may remove with alcohol and continue cycle.    Historical Provider, MD  fluticasone (FLONASE) 50 MCG/ACT nasal spray Place 2 sprays into both nostrils daily.    Historical Provider, MD  gabapentin (NEURONTIN) 600 MG tablet 1 tab po tid 07/21/15   Mackie Pai, PA-C  glimepiride (AMARYL) 2 MG tablet Take 1 tablet (2 mg total) by mouth daily. 08/17/15   Percell Miller [name], PA-C  mometasone-formoterol (DULERA) 100-5 MCG/ACT AERO Inhale 2 puffs into the lungs 2 (two) times  daily. 06/26/15   Edward [name], PA-C  montelukast (SINGULAIR) 10 MG tablet Take 1 tablet (10 mg total) by mouth at bedtime. 07/21/15   Edward [name], PA-C  NON FORMULARY CPAP at night    Historical Provider, MD  omeprazole (PRILOSEC) 20 MG capsule Take 1 capsule (20 mg total) by mouth daily. 08/17/15   Percell Miller [name], PA-C  potassium chloride (K-DUR,KLOR-CON) 10 MEQ tablet Take 10 mEq by mouth 2 (two) times daily.    Historical Provider, MD  ramipril (ALTACE) 2.5  MG capsule Take 2.5 mg by mouth daily.    Historical Provider, MD  simvastatin (ZOCOR) 20 MG tablet Take 20 mg by mouth daily.    Historical Provider, MD   BP 92/44 mmHg  Pulse 60  Temp(Src) 102.4 F (39.1 C) (Rectal)  Resp 25  Ht 6' (1.829 m)  Wt 274 lb 6.4 oz (124.467 kg)  BMI 37.21 kg/m2  SpO2 90% Physical Exam  Constitutional: He appears well-developed and well-nourished. He appears listless. He is cooperative. He appears ill. No distress.  HENT:  Head: Normocephalic and atraumatic.  Eyes: Conjunctivae are normal.  Neck: Neck supple. No tracheal deviation present.  Cardiovascular: Normal rate, regular rhythm and normal heart sounds.   Pulmonary/Chest: Effort normal. No respiratory distress. He has no wheezes. He exhibits no tenderness.  Abdominal: Soft. He exhibits no distension.  Neurological: He has normal strength. He appears listless. He is disoriented (and hard of hearing). No cranial nerve deficit. GCS eye subscore is 4. GCS verbal subscore is 4. GCS motor subscore is 6.  Skin: Skin is warm and dry.  Psychiatric: He has a normal mood and affect.    ED Course  Procedures (including critical care time)  CRITICAL CARE Performed by: Leo Grosser Total critical care time: 30 minutes Critical care time was exclusive of separately billable procedures and treating other patients. Critical care was necessary to treat or prevent imminent or life-threatening deterioration. Critical care was time spent personally by me on the following activities: development of treatment plan with patient and/or surrogate as well as nursing, discussions with consultants, evaluation of patient's response to treatment, examination of patient, obtaining history from patient or surrogate, ordering and performing treatments and interventions, ordering and review of laboratory studies, ordering and review of radiographic studies, pulse oximetry and re-evaluation of patient's condition.  Emergency  Focused Ultrasound Exam Limited Ultrasound of the Heart and Pericardium  Performed and interpreted by Dr. Laneta Simmers Indication: hypotension Multiple views of the heart, pericardium, and IVC are obtained with a multi frequency probe.  Findings: depressed contractility, no anechoic fluid, complete IVC collapse Interpretation: mildly reduced ejection fraction, no pericardial effusion, depressed CVP Images archived electronically.  CPT Code: [id number]  Labs Review Labs Reviewed  COMPREHENSIVE METABOLIC PANEL - Abnormal; Notable for the following:    Glucose, Bld 167 (*)    BUN 25 (*)    Creatinine, Ser 1.75 (*)    Calcium 8.1 (*)    Total Protein 5.9 (*)    Albumin 3.0 (*)    ALT 13 (*)    Total Bilirubin 1.7 (*)    GFR calc non Af Amer 34 (*)    GFR calc Af Amer 40 (*)    All other components within normal limits  CBC WITH DIFFERENTIAL/PLATELET - Abnormal; Notable for the following:    WBC 11.9 (*)    RBC 3.93 (*)    Hemoglobin 11.5 (*)    HCT 36.0 (*)    RDW 15.6 (*)  Platelets 143 (*)    Neutro Abs 10.5 (*)    All other components within normal limits  I-STAT CG4 LACTIC ACID, ED - Abnormal; Notable for the following:    Lactic Acid, Venous 2.19 (*)    All other components within normal limits  CBG MONITORING, ED - Abnormal; Notable for the following:    Glucose-Capillary 167 (*)    All other components within normal limits  CULTURE, BLOOD (ROUTINE X 2)  CULTURE, BLOOD (ROUTINE X 2)  URINE CULTURE  URINALYSIS, ROUTINE W REFLEX MICROSCOPIC (NOT AT [facility])    Imaging Review  Dg Chest Port 1 View  08/18/2015  CLINICAL DATA:  Fever.  Multiple recent falls. EXAM: PORTABLE CHEST 1 VIEW COMPARISON:  None. FINDINGS: There is moderate interstitial edema with cardiomegaly and pulmonary venous hypertension. The small left effusion. There is no airspace consolidation. No adenopathy. No pneumothorax. No bone lesions. IMPRESSION: Congestive heart failure. No airspace consolidation. No  pneumothorax. Electronically Signed   By: Lowella Grip III M.D.   On: 08/18/2015 09:32   I have personally reviewed and evaluated these images and lab results as part of my medical decision-making.   EKG Interpretation None      MDM   Final diagnoses:  Severe sepsis (HCC)  Fever in adult  Chronic systolic congestive heart failure (HCC)  Multifocal lung consolidation ([place])  Community acquired pneumonia    81 year old male with history of COPD and congestive heart failure presents with fever and confusion over the last 2 days, he is febrile and moderately hypotensive on arrival here. He has had multiple falls due to worsening balance without any identifiable injury. He is endorsing change in the color of his sputum with increased production concerning for pneumonia. Chest x-ray shows multifocal infiltrates and clinical context likely representing a progressive multifocal pneumonia. Bedside ultrasound demonstrating complete collapse of the IVC suggesting infiltrates are more likely consolidation than pulmonary edema currently and more aggressive fluid resuscitation and antibiotics are warranted. Hospitalist was consulted for admission to stepdown in criticalcondition and will see the patient in the emergency department.     Leo Grosser, MD 08/20/15 [phone]

## 2015-08-18 NOTE — Progress Notes (Deleted)
Attending Note:  81 year old male with CHF history presenting to the ED with SOB and fever.  Noted to have a pneumonia and borderline BP.  Patient was given a total of 3L NS subsequently started coughing up very thick sputum and developed hypoxemic respiratory failure and PCCM was called on consultation for intubation and transfer to the ICU.  On exam, decreased BS and crackles diffusely.  Patient is confused.  Obvious respiratory distress.  Will move patient to the ICU and intubate.  Broaden abx spectrum.  Hold diureses for now.  KVO IVF.  Check CVP.  Check ABG and CXR now and will adjust vent accordingly.  Attempted on 2 different occassions to reach daughter with little success.  Will have to place central line emergently as patient's BP dropped to the 80's.  Stabilize and f/u on cultures.  The patient is critically ill with multiple organ systems failure and requires high complexity decision making for assessment and support, frequent evaluation and titration of therapies, application of advanced monitoring technologies and extensive interpretation of multiple databases.   Critical Care Time devoted to patient care services described in this note is  35  Minutes. This time reflects time of care of this signee Dr Jennet Maduro. This critical care time does not reflect procedure time, or teaching time or supervisory time of PA/NP/Med student/Med Resident etc but could involve care discussion time.  Rush Farmer, M.D. [facility] Pulmonary/Critical Care Medicine. Pager: [phone]. After hours pager: [phone].

## 2015-08-18 NOTE — Progress Notes (Signed)
RT note-fio2 decreased post ABG results.

## 2015-08-19 ENCOUNTER — Inpatient Hospital Stay (HOSPITAL_COMMUNITY): Payer: Medicare Other

## 2015-08-19 DIAGNOSIS — N179 Acute kidney failure, unspecified: Secondary | ICD-10-CM

## 2015-08-19 LAB — COMPREHENSIVE METABOLIC PANEL
ALK PHOS: 72 U/L (ref 38–126)
ALT: 13 U/L — ABNORMAL LOW (ref 17–63)
ANION GAP: 8 (ref 5–15)
AST: 22 U/L (ref 15–41)
Albumin: 2.5 g/dL — ABNORMAL LOW (ref 3.5–5.0)
BILIRUBIN TOTAL: 0.5 mg/dL (ref 0.3–1.2)
BUN: 21 mg/dL — AB (ref 6–20)
CO2: 25 mmol/L (ref 22–32)
Calcium: 8.3 mg/dL — ABNORMAL LOW (ref 8.9–10.3)
Chloride: 108 mmol/L (ref 101–111)
Creatinine, Ser: 1.57 mg/dL — ABNORMAL HIGH (ref 0.61–1.24)
GFR, EST AFRICAN AMERICAN: 45 mL/min — AB (ref >60)
GFR, EST NON AFRICAN AMERICAN: 39 mL/min — AB (ref >60)
Glucose, Bld: 189 mg/dL — ABNORMAL HIGH (ref 65–99)
POTASSIUM: 3.8 mmol/L (ref 3.5–5.1)
Sodium: 141 mmol/L (ref 135–145)
Total Protein: 5.6 g/dL — ABNORMAL LOW (ref 6.5–8.1)

## 2015-08-19 LAB — CBC
HEMATOCRIT: 35.1 % — AB (ref 39.0–52.0)
Hemoglobin: 11.5 g/dL — ABNORMAL LOW (ref 13.0–17.0)
MCH: 30.2 pg (ref 26.0–34.0)
MCHC: 32.8 g/dL (ref 30.0–36.0)
MCV: 92.1 fL (ref 78.0–100.0)
Platelets: 152 10*3/uL (ref 150–400)
RBC: 3.81 MIL/uL — ABNORMAL LOW (ref 4.22–5.81)
RDW: 15.8 % — AB (ref 11.5–15.5)
WBC: 10.8 10*3/uL — AB (ref 4.0–10.5)

## 2015-08-19 LAB — BLOOD GAS, ARTERIAL
Acid-base deficit: 1.2 mmol/L (ref 0.0–2.0)
BICARBONATE: 22.2 meq/L (ref 20.0–24.0)
Drawn by: 270271
FIO2: 0.5
LHR: 16 {breaths}/min
O2 SAT: 99 %
PATIENT TEMPERATURE: 98.6
PCO2 ART: 32.3 mmHg — AB (ref 35.0–45.0)
PEEP: 5 cmH2O
PH ART: 7.453 — AB (ref 7.350–7.450)
TCO2: 23.2 mmol/L (ref 0–100)
VT: 620 mL
pO2, Arterial: 152 mmHg — ABNORMAL HIGH (ref 80.0–100.0)

## 2015-08-19 LAB — MAGNESIUM: Magnesium: 2.4 mg/dL (ref 1.7–2.4)

## 2015-08-19 LAB — GLUCOSE, CAPILLARY
GLUCOSE-CAPILLARY: 169 mg/dL — AB (ref 65–99)
GLUCOSE-CAPILLARY: 188 mg/dL — AB (ref 65–99)
Glucose-Capillary: 139 mg/dL — ABNORMAL HIGH (ref 65–99)
Glucose-Capillary: 142 mg/dL — ABNORMAL HIGH (ref 65–99)
Glucose-Capillary: 162 mg/dL — ABNORMAL HIGH (ref 65–99)
Glucose-Capillary: 182 mg/dL — ABNORMAL HIGH (ref 65–99)

## 2015-08-19 LAB — BASIC METABOLIC PANEL
Anion gap: 8 (ref 5–15)
BUN: 23 mg/dL — AB (ref 6–20)
CALCIUM: 8.5 mg/dL — AB (ref 8.9–10.3)
CO2: 25 mmol/L (ref 22–32)
CREATININE: 1.39 mg/dL — AB (ref 0.61–1.24)
Chloride: 108 mmol/L (ref 101–111)
GFR calc Af Amer: 52 mL/min — ABNORMAL LOW (ref >60)
GFR calc non Af Amer: 45 mL/min — ABNORMAL LOW (ref >60)
GLUCOSE: 188 mg/dL — AB (ref 65–99)
Potassium: 4.1 mmol/L (ref 3.5–5.1)
Sodium: 141 mmol/L (ref 135–145)

## 2015-08-19 LAB — TROPONIN I
TROPONIN I: 0.27 ng/mL — AB (ref <0.031)
TROPONIN I: 0.4 ng/mL — AB (ref <0.031)
Troponin I: 0.31 ng/mL — ABNORMAL HIGH (ref <0.031)

## 2015-08-19 LAB — HEMOGLOBIN A1C
Hgb A1c MFr Bld: 6.2 % — ABNORMAL HIGH (ref 4.8–5.6)
MEAN PLASMA GLUCOSE: 131 mg/dL

## 2015-08-19 LAB — URINE CULTURE: Culture: NO GROWTH

## 2015-08-19 LAB — PROCALCITONIN: Procalcitonin: 2.74 ng/mL

## 2015-08-19 MED ORDER — VITAL HIGH PROTEIN PO LIQD
1000.0000 mL | ORAL | Status: DC
  Filled 2015-08-19 (×8): qty 1000

## 2015-08-19 MED ORDER — PRO-STAT SUGAR FREE PO LIQD
30.0000 mL | Freq: Three times a day (TID) | ORAL | Status: DC
  Administered 2015-08-19 – 2015-08-20 (×4): 30 mL
  Filled 2015-08-19 (×5): qty 30

## 2015-08-19 MED ORDER — SODIUM CHLORIDE 0.9 % IV SOLN
1.0000 mg/h | INTRAVENOUS | Status: DC
  Administered 2015-08-19: 5 mg/h via INTRAVENOUS
  Administered 2015-08-19: 3 mg/h via INTRAVENOUS
  Filled 2015-08-19 (×3): qty 10

## 2015-08-19 MED ORDER — FUROSEMIDE 10 MG/ML IJ SOLN
40.0000 mg | Freq: Three times a day (TID) | INTRAMUSCULAR | Status: AC
  Administered 2015-08-19 (×2): 40 mg via INTRAVENOUS
  Filled 2015-08-19 (×2): qty 4

## 2015-08-19 MED ORDER — POTASSIUM CHLORIDE 20 MEQ/15ML (10%) PO SOLN
40.0000 meq | Freq: Three times a day (TID) | ORAL | Status: AC
  Administered 2015-08-19 (×2): 40 meq
  Filled 2015-08-19 (×2): qty 30

## 2015-08-19 MED ORDER — FENTANYL CITRATE (PF) 100 MCG/2ML IJ SOLN
25.0000 ug | INTRAMUSCULAR | Status: DC | PRN
  Administered 2015-08-19: 100 ug via INTRAVENOUS
  Administered 2015-08-19 – 2015-08-20 (×3): 50 ug via INTRAVENOUS
  Filled 2015-08-19 (×4): qty 2

## 2015-08-19 MED ORDER — SODIUM CHLORIDE 0.9 % IV SOLN
INTRAVENOUS | Status: DC
  Administered 2015-08-19: 10:00:00 via INTRAVENOUS

## 2015-08-19 NOTE — Progress Notes (Signed)
PULMONARY / CRITICAL CARE MEDICINE   Name: Christopher Holder MRN: [id number] DOB: [birth year]    ADMISSION DATE:  08/18/2015 CONSULTATION DATE:  11/29 REFERRING MD :  Tyrell Antonio   CHIEF COMPLAINT:  Acute respiratory failure   HISTORY OF PRESENT ILLNESS:   81 year old gentleman recently moved here from [place], has a past medical history of CAD followed by Dr. Marlou Porch and history of asthma versus COPD currently being worked up by Dr. Lamonte Sakai  Also has h/o diabetes on oral medications, sleep apnea on Nocturnal CPAP, hypertension, diabetic neuropathy, obesity, remote CAD and dyslipidemia. Presents to the ER on 11/29 after 2 separate falls that morning. Upon EMS arrival patient was febrile with a temperature of 101.1 orally and relative hypotension with a BP of 97/37. Patient was given a gram of Tylenol and transported to the hospital. According to the patient's family he had been having diarrhea that morning and had strong smelling urine. In the ER he was given normal saline fluids and was somewhat disoriented and noted to be significantly hard of hearing. He was also found to have a small abrasion on his left knee. In the ER patient's rectal temperature was 102.4, respirations were 25, pulse was 60 and he was in sinus rhythm, BP was 92/44, O2 sats were 88% on room air so oxygen was applied and saturations of subsequently increased to 97%. He  received 3 L of fluid in the ER. He was admitted to the SDU w/ working dx of CAP and sepsis. Not long after arrival to the SDU he was found more confused and hypoxic w/ O2 sats in low 80s on 4 liters. PCCM was asked to eval given concern for impending respiratory arrest.   SUBJECTIVE:  Sedate but easily arousable  VITAL SIGNS: BP 126/45 mmHg  Pulse 39  Temp(Src) 97.9 F (36.6 C) (Oral)  Resp 16  Ht 6' (1.829 m)  Wt 127.2 kg (280 lb 6.8 oz)  BMI 38.02 kg/m2  SpO2 97%  HEMODYNAMICS: CVP:  [8 mmHg-21 mmHg] 21 mmHg  VENTILATOR SETTINGS: Vent Mode:  [-] PRVC FiO2  (%):  [40 %-100 %] 40 % Set Rate:  [16 bmp] 16 bmp Vt Set:  [620 mL] 620 mL PEEP:  [5 cmH20] 5 cmH20 Pressure Support:  [10 cmH20] 10 cmH20 Plateau Pressure:  [20 cmH20-28 cmH20] 22 cmH20  INTAKE / OUTPUT: I/O last 3 completed shifts: In: 2315.7 [I.V.:2065.7; IV Piggyback:250] Out: 850 [Urine:850]  PHYSICAL EXAMINATION: General:  81 year old male, lethargic, appears toxic.  Neuro:  Awakens w/ stimulation, moves all ext, generalized weakness, oriented X 2 HEENT:  NCAT, no JVD Cardiovascular:  rrr w/out MRG Lungs:  Diffuse rhonchi, + accessory muscle use  Abdomen:  Obese + bowel sounds  Musculoskeletal:  Good bulk, Equal st  Skin:  Warm and dry   LABS:  CBC  Recent Labs Lab 08/18/15 0838 08/18/15 2020 08/19/15 0505  WBC 11.9* 12.6* 10.8*  HGB 11.5* 11.1* 11.5*  HCT 36.0* 34.7* 35.1*  PLT 143* 145* 152   Coag's  Recent Labs Lab 08/18/15 1720  APTT 39*  INR 1.64*   BMET  Recent Labs Lab 08/18/15 0838 08/18/15 1640 08/19/15 0505  NA 139 139 141  K 3.9 4.2 3.8  CL 106 106 108  CO2 22 24 25   BUN 25* 22* 21*  CREATININE 1.75* 1.71* 1.57*  GLUCOSE 167* 241* 189*   Electrolytes  Recent Labs Lab 08/18/15 0838 08/18/15 1640 08/19/15 0505  CALCIUM 8.1* 7.8* 8.3*  MG 2.2  --   --  Sepsis Markers  Recent Labs Lab 08/18/15 0852 08/18/15 1640 08/18/15 1647 08/19/15 0505  LATICACIDVEN 2.19*  --  1.4  --   PROCALCITON  --  1.81  --  2.74   ABG  Recent Labs Lab 08/18/15 1450 08/18/15 1656 08/19/15 0340  PHART 7.300* 7.322* 7.453*  PCO2ART 48.6* 46.4* 32.3*  PO2ART 50.0* 281* 152*   Liver Enzymes  Recent Labs Lab 08/18/15 0838 08/18/15 1640 08/19/15 0505  AST 19 21 22   ALT 13* 15* 13*  ALKPHOS 77 76 72  BILITOT 1.7* 1.2 0.5  ALBUMIN 3.0* 2.7* 2.5*   Cardiac Enzymes  Recent Labs Lab 08/19/15 0044 08/19/15 0505  TROPONINI 0.40* 0.31*   Glucose  Recent Labs Lab 08/18/15 1330 08/18/15 1611 08/18/15 1930 08/18/15 2340  08/19/15 0340 08/19/15 0728  GLUCAP 139* 212* 209* 182* 169* 139*    Imaging Portable Chest Xray  08/19/2015  CLINICAL DATA:  Acute respiratory failure with hypoxia, intubated patient, coronary artery disease, sepsis, acute renal insufficiency. EXAM: PORTABLE CHEST 1 VIEW COMPARISON:  Portable chest x-ray of August 18, 2015 FINDINGS: The lungs are adequately inflated. The alveolar opacities bilaterally are slightly less conspicuous today. The cardiac silhouette remains enlarged. The pulmonary vascularity is engorged. The endotracheal tube tip lies approximately 3.5 cm above the carina. The left subclavian venous catheter tip projects over the midportion of the SVC. IMPRESSION: Slight interval improvement in the appearance of the pulmonary parenchyma with some resolution of interstitial edema or pneumonia. Significant abnormalities remain. The support tubes are in reasonable position. Electronically Signed   By: David  Martinique M.D.   On: 08/19/2015 07:25   Portable Chest Xray  08/18/2015  CLINICAL DATA:  Central line placement EXAM: PORTABLE CHEST 1 VIEW COMPARISON:  08/18/2015 FINDINGS: Endotracheal tube has been placed with tip 2.6 cm above the carina. Left subclavian central line tip projects over the superior vena cava. No pneumothorax. Extensive bilateral hazy and interstitial opacities suggesting edema. Cardiac enlargement. IMPRESSION: Increased severity of bilateral airspace disease. Support devices as described . Electronically Signed   By: Skipper Cliche M.D.   On: 08/18/2015 16:37  worsening R>L airspace disease    STUDIES:  ECHO 11/29>>>  CULTURES: Sputum 11/29>>> BCX2 11/29>>> U strep 11/29>>> U legionella 11/29>> Influenza panel 11/29: neg   ANTIBIOTICS: Rocephin 11/29>>> azithro 11/29>>> vanc 11/29>>>  SIGNIFICANT EVENTS:   LINES/TUBES: OETT 11/29>>>  DISCUSSION: 25 yowm w/ complicated medical hx including CHF, prob COPD and OSA. Admitted 11/28 w/ CAP and Sepsis.  Was volume resuscitated, abx initiated and admitted to the SDU. Shortly after admission developed progressive respiratory distress w/ copious sputum production. Moved to the ICU for progressive respiratory failure. Suspect that this is a mix of progressive ALI vs Pulmonary edema vs mix of the three. Now intubated. Will add vanc to CAP coverage and check CVP. Will eventually need diuresis if BP remains stable.   ASSESSMENT / PLAN:  PULMONARY A: Acute hypoxic respiratory failure in setting of CAP c/b either evolving ALI vs pulmonary edema vs mix of both  >thick purulent mucous will not allow for BIPAP  P:   Full vent support. PAD protocol. See ID section. SBT in AM.  CARDIOVASCULAR A:  Severe sepsis.  H/o CHF r/o pulmonary edema  P:  KVO IVF. Low dose lasix, 40 q8 x2 doses. KCl PO as ordered. Cycle CEs. 12 lead.  RENAL A:   AKI Lactic acidosis (mild) Now s/p volume resuscitation  P:   Renal dose meds Strict I&O F/u chemistry  in am  GASTROINTESTINAL A:   Obesity  H/o GERD P:   PPI Start tubefeeds per protocol   HEMATOLOGIC A:   Anemia  Mild thrombocytopenia  P:  LMWH Trend cbc Transfuse per typical protocol   INFECTIOUS A:   Probable CAP Sepsis  P:   BCX2 11/29>>> Sputum 11/29>>> U strep 11/29>>> U legionella 11/29>>>  Rocephin 11/29>>> Azitho 11/29>>> Vanc 11/29>>>  ENDOCRINE A:   DM w/ hyperglycemia  P:   ssi protocol   NEUROLOGIC A:   Acute Encephalopathy in setting of sepsis and hypoxia P:   RASS goal: -2 PAD protocol   FAMILY  - Updates: daughter updated bedside.   - Inter-disciplinary family meet or Palliative Care meeting due by:  12/6  The patient is critically ill with multiple organ systems failure and requires high complexity decision making for assessment and support, frequent evaluation and titration of therapies, application of advanced monitoring technologies and extensive interpretation of multiple databases.    Critical Care Time devoted to patient care services described in this note is 35 Minutes. This time reflects time of care of this signee Dr Jennet Maduro. This critical care time does not reflect procedure time, or teaching time or supervisory time of PA/NP/Med student/Med Resident etc but could involve care discussion time.  Rush Farmer, M.D. [facility] Pulmonary/Critical Care Medicine. Pager: [phone]. After hours pager: [phone].  08/19/2015, 10:39 AM

## 2015-08-19 NOTE — Progress Notes (Signed)
[place] Progress Note Patient Name: Christopher Holder DOB: Feb 05, 1930 MRN: [id number]   Date of Service  08/19/2015  HPI/Events of Note  Call from nurse reporting bradycardia with HR running in the 50s dropping to the 30s.  Current BP is in the [id number] systolic.  Also with oliguria.  eICU Interventions  Plan: Cycle trop/12 lead EKG No intervention for HR currently given adequate BP Insert foley for oliguia/monitor UOP     Intervention Category Intermediate Interventions: Arrhythmia - evaluation and management;Oliguria - evaluation and management  Yordin Rhoda 08/19/2015, 12:01 AM

## 2015-08-19 NOTE — Progress Notes (Signed)
RT note- attempted to wean, sleepy, high PIP's, will attempt again later this morning. Cont to monitor.

## 2015-08-19 NOTE — Progress Notes (Signed)
   08/19/15 1557  Clinical Encounter Type  Visited With Patient;Family;Health care Teleshia Lemere  Visit Type Initial  Referral From Family  Spiritual Encounters  Spiritual Needs Prayer   Chaplain responded to grand-daughter's request to visit with patient and pray for him. Chaplain prayed for patient, and our support is available as needed.  Jeri Lager, Chaplain 08/19/2015 3:58 PM

## 2015-08-19 NOTE — Progress Notes (Signed)
Tiffany Kocher NP notified of troponin of 0.27, no new orders received will continue to monitor.

## 2015-08-19 NOTE — Progress Notes (Signed)
[facility] Progress Note Patient Name: Rockney Balthazor DOB: 1930-07-02 MRN: [id number]   Date of Service  08/19/2015  HPI/Events of Note  Frequent PVC's and bigeminy.   eICU Interventions  Will order: 1. BMP and Mg++ level now.  2. 12 Lead EKG now.      Intervention Category Intermediate Interventions: Arrhythmia - evaluation and management  Jakorian Marengo Eugene 08/19/2015, 6:47 PM

## 2015-08-19 NOTE — Progress Notes (Signed)
Dr Nelda Marseille aware that pt pulse rate decreases to 30s, he stated that as long at patient maintains a good BP then there is no concern for pulse rate. Orders given to insert foley catheter due to previous retention. Pt found to have urinated 92ml of amber urine into condom cath bag and bladder scan revealed 64ml of urine remaining in bladder. Dr Nelda Marseille informed of pt currently with no retention and requesting we delay inserting catheter. He stated it is ok to not insert foley at this point but if patient is found to retain urine later to please insert catheter. Will continue to monitor patient.

## 2015-08-19 NOTE — Progress Notes (Signed)
Initial Nutrition Assessment  DOCUMENTATION CODES:   Obesity unspecified  INTERVENTION:   Initiate Vital High Protein @ 20 ml/hr via OG tube and increase by 10 ml every 4 hours to goal rate of 60 ml/hr.   30 ml Prostat TID.    Tube feeding regimen provides 1740 kcal, 171 grams of protein, and 1203 ml of H2O.   NUTRITION DIAGNOSIS:   Inadequate oral intake related to inability to eat as evidenced by NPO status.  GOAL:   Provide needs based on ASPEN/SCCM guidelines  MONITOR:   Vent status, Labs, Weight trends, TF tolerance  REASON FOR ASSESSMENT:   Consult Enteral/tube feeding initiation and management  ASSESSMENT:   18 yowm w/ complicated medical hx including CHF, prob COPD and OSA. Admitted 11/28 w/ CAP and Sepsis.  Patient is currently intubated on ventilator support MV: 10.1 L/min Temp (24hrs), Avg:98.1 F (36.7 C), Min:97.2 F (36.2 C), Max:100.4 F (38 C)  Medications reviewed and include: KCl OG tube  Nutrition-Focused physical exam completed. Findings are no fat depletion, no muscle depletion, and moderate edema.    Diet Order:  Diet NPO time specified  Skin:  Reviewed, no issues  Last BM:  11/29  Height:   Ht Readings from Last 1 Encounters:  08/18/15 6' (1.829 m)   Weight:   Wt Readings from Last 1 Encounters:  08/19/15 280 lb 6.8 oz (127.2 kg)   Ideal Body Weight:  80.9 kg  BMI:  Body mass index is 38.02 kg/(m^2).  Estimated Nutritional Needs:   Kcal:  1399-1780  Protein:  </= 161 grams  Fluid:  > 1.5 L/day  EDUCATION NEEDS:   No education needs identified at this time  [place], [place], [place] Pager [phone] After Hours Pager

## 2015-08-20 ENCOUNTER — Inpatient Hospital Stay (HOSPITAL_COMMUNITY): Payer: Medicare Other

## 2015-08-20 ENCOUNTER — Other Ambulatory Visit (HOSPITAL_COMMUNITY): Payer: Medicare Other

## 2015-08-20 LAB — BLOOD GAS, ARTERIAL
ACID-BASE EXCESS: 0 mmol/L (ref 0.0–2.0)
Bicarbonate: 23.2 mEq/L (ref 20.0–24.0)
DRAWN BY: 270271
FIO2: 0.4
MECHVT: 620 mL
O2 SAT: 97.5 %
PCO2 ART: 31.3 mmHg — AB (ref 35.0–45.0)
PEEP/CPAP: 5 cmH2O
PH ART: 7.482 — AB (ref 7.350–7.450)
PO2 ART: 94.1 mmHg (ref 80.0–100.0)
Patient temperature: 98.6
RATE: 16 resp/min
TCO2: 24.1 mmol/L (ref 0–100)

## 2015-08-20 LAB — BASIC METABOLIC PANEL
ANION GAP: 9 (ref 5–15)
BUN: 31 mg/dL — ABNORMAL HIGH (ref 6–20)
CALCIUM: 8.5 mg/dL — AB (ref 8.9–10.3)
CO2: 27 mmol/L (ref 22–32)
CREATININE: 1.41 mg/dL — AB (ref 0.61–1.24)
Chloride: 108 mmol/L (ref 101–111)
GFR, EST AFRICAN AMERICAN: 52 mL/min — AB (ref >60)
GFR, EST NON AFRICAN AMERICAN: 44 mL/min — AB (ref >60)
GLUCOSE: 142 mg/dL — AB (ref 65–99)
Potassium: 4.1 mmol/L (ref 3.5–5.1)
Sodium: 144 mmol/L (ref 135–145)

## 2015-08-20 LAB — CULTURE, RESPIRATORY W GRAM STAIN

## 2015-08-20 LAB — GLUCOSE, CAPILLARY
GLUCOSE-CAPILLARY: 116 mg/dL — AB (ref 65–99)
GLUCOSE-CAPILLARY: 145 mg/dL — AB (ref 65–99)
GLUCOSE-CAPILLARY: 83 mg/dL (ref 65–99)
Glucose-Capillary: 127 mg/dL — ABNORMAL HIGH (ref 65–99)
Glucose-Capillary: 132 mg/dL — ABNORMAL HIGH (ref 65–99)
Glucose-Capillary: 127 mg/dL — ABNORMAL HIGH (ref 65–99)

## 2015-08-20 LAB — CBC
HCT: 35.1 % — ABNORMAL LOW (ref 39.0–52.0)
Hemoglobin: 11.5 g/dL — ABNORMAL LOW (ref 13.0–17.0)
MCH: 29.9 pg (ref 26.0–34.0)
MCHC: 32.8 g/dL (ref 30.0–36.0)
MCV: 91.2 fL (ref 78.0–100.0)
PLATELETS: 172 10*3/uL (ref 150–400)
RBC: 3.85 MIL/uL — ABNORMAL LOW (ref 4.22–5.81)
RDW: 15.7 % — AB (ref 11.5–15.5)
WBC: 10.7 10*3/uL — AB (ref 4.0–10.5)

## 2015-08-20 LAB — PROCALCITONIN: PROCALCITONIN: 2.61 ng/mL

## 2015-08-20 LAB — CULTURE, RESPIRATORY: CULTURE: NO GROWTH

## 2015-08-20 LAB — PHOSPHORUS: Phosphorus: 3 mg/dL (ref 2.5–4.6)

## 2015-08-20 LAB — MAGNESIUM: Magnesium: 2.3 mg/dL (ref 1.7–2.4)

## 2015-08-20 MED ORDER — CHLORHEXIDINE GLUCONATE 0.12 % MT SOLN
OROMUCOSAL | Status: AC
  Filled 2015-08-20: qty 15

## 2015-08-20 NOTE — Progress Notes (Signed)
PULMONARY / CRITICAL CARE MEDICINE   Name: Christopher Holder MRN: [id number] DOB: 11-11-1929    ADMISSION DATE:  08/18/2015 CONSULTATION DATE:  11/29 REFERRING MD :  Tyrell Antonio   CHIEF COMPLAINT:  Acute respiratory failure   HISTORY OF PRESENT ILLNESS:   81 year old gentleman recently moved here from [place], has a past medical history of CAD followed by Dr. Marlou Porch and history of asthma versus COPD currently being worked up by Dr. Lamonte Sakai  Also has h/o diabetes on oral medications, sleep apnea on Nocturnal CPAP, hypertension, diabetic neuropathy, obesity, remote CAD and dyslipidemia. Presents to the ER on 11/29 after 2 separate falls that morning. Upon EMS arrival patient was febrile with a temperature of 101.1 orally and relative hypotension with a BP of 97/37. Patient was given a gram of Tylenol and transported to the hospital. According to the patient's family he had been having diarrhea that morning and had strong smelling urine. In the ER he was given normal saline fluids and was somewhat disoriented and noted to be significantly hard of hearing. He was also found to have a small abrasion on his left knee. In the ER patient's rectal temperature was 102.4, respirations were 25, pulse was 60 and he was in sinus rhythm, BP was 92/44, O2 sats were 88% on room air so oxygen was applied and saturations of subsequently increased to 97%. He  received 3 L of fluid in the ER. He was admitted to the SDU w/ working dx of CAP and sepsis. Not long after arrival to the SDU he was found more confused and hypoxic w/ O2 sats in low 80s on 4 liters. PCCM was asked to eval given concern for impending respiratory arrest.   SUBJECTIVE:  Sedate but easily arousable  VITAL SIGNS: BP 130/49 mmHg  Pulse 47  Temp(Src) 97.4 F (36.3 C) (Axillary)  Resp 16  Ht 6' (1.829 m)  Wt 128.1 kg (282 lb 6.6 oz)  BMI 38.29 kg/m2  SpO2 94%  HEMODYNAMICS: CVP:  [12 mmHg-37 mmHg] 19 mmHg  VENTILATOR SETTINGS: Vent Mode:  [-]  PRVC FiO2 (%):  [40 %] 40 % Set Rate:  [16 bmp] 16 bmp Vt Set:  [620 mL] 620 mL PEEP:  [5 cmH20] 5 cmH20 Plateau Pressure:  [18 cmH20-20 cmH20] 20 cmH20  INTAKE / OUTPUT: I/O last 3 completed shifts: In: 2411.3 [I.V.:1445; NG/GT:616.3; IV Piggyback:350] Out: 2775 [Urine:2775]  PHYSICAL EXAMINATION: General:  81 year old male, sedate but easily arousable.  Neuro:  Awakens w/ stimulation, moves all ext, generalized weakness HEENT:  NCAT, no JVD Cardiovascular:  rrr w/out MRG. Lungs:  Bibasilar crackles. Abdomen:  Obese + bowel sounds. Musculoskeletal:  -edema and -tenderness. Skin:  Warm and dry.  LABS:  CBC  Recent Labs Lab 08/18/15 2020 08/19/15 0505 08/20/15 0452  WBC 12.6* 10.8* 10.7*  HGB 11.1* 11.5* 11.5*  HCT 34.7* 35.1* 35.1*  PLT 145* 152 172   Coag's  Recent Labs Lab 08/18/15 1720  APTT 39*  INR 1.64*   BMET  Recent Labs Lab 08/19/15 0505 08/19/15 1845 08/20/15 0452  NA 141 141 144  K 3.8 4.1 4.1  CL 108 108 108  CO2 25 25 27   BUN 21* 23* 31*  CREATININE 1.57* 1.39* 1.41*  GLUCOSE 189* 188* 142*   Electrolytes  Recent Labs Lab 08/18/15 0838  08/19/15 0505 08/19/15 1845 08/20/15 0452  CALCIUM 8.1*  < > 8.3* 8.5* 8.5*  MG 2.2  --   --  2.4 2.3  PHOS  --   --   --   --  3.0  < > = values in this interval not displayed. Sepsis Markers  Recent Labs Lab 08/18/15 0852 08/18/15 1640 08/18/15 1647 08/19/15 0505 08/20/15 0452  LATICACIDVEN 2.19*  --  1.4  --   --   PROCALCITON  --  1.81  --  2.74 2.61   ABG  Recent Labs Lab 08/18/15 1656 08/19/15 0340 08/20/15 0440  PHART 7.322* 7.453* 7.482*  PCO2ART 46.4* 32.3* 31.3*  PO2ART 281* 152* 94.1   Liver Enzymes  Recent Labs Lab 08/18/15 0838 08/18/15 1640 08/19/15 0505  AST 19 21 22   ALT 13* 15* 13*  ALKPHOS 77 76 72  BILITOT 1.7* 1.2 0.5  ALBUMIN 3.0* 2.7* 2.5*   Cardiac Enzymes  Recent Labs Lab 08/19/15 0044 08/19/15 0505 08/19/15 1405  TROPONINI 0.40* 0.31*  0.27*   Glucose  Recent Labs Lab 08/19/15 1218 08/19/15 1540 08/19/15 1954 08/19/15 2330 08/20/15 0341 08/20/15 0746  GLUCAP 142* 188* 162* 145* 127* 132*    Imaging Dg Chest Port 1 View  08/20/2015  CLINICAL DATA:  Endotracheal tube.  Sepsis.  History of CHF. EXAM: PORTABLE CHEST 1 VIEW COMPARISON:  Yesterday FINDINGS: Endotracheal tube tip between the clavicular heads and carina, stable from prior. There is a new orogastric tube which at least reaches the stomach. Left subclavian central line, tip at the SVC level. Unchanged patchy airspace disease of the bilateral chest, with hazy appearance of the left base which may be layering fluid. No evidence of pneumothorax. Stable cardiomegaly and vascular pedicle widening. IMPRESSION: 1. New orogastric tube is in good position. Stable positioning of pre-existing support apparatus. 2. Unchanged diffuse airspace disease, likely pulmonary edema and pneumonia. Electronically Signed   By: Monte Fantasia M.D.   On: 08/20/2015 07:25  worsening R>L airspace disease    STUDIES:  ECHO 11/29>>>  CULTURES: Sputum 11/29>>>NTD BCX2 11/29>>>NTD U strep 11/29>>>Neg U legionella 11/29>>Neg Influenza panel 11/29: neg   ANTIBIOTICS: Rocephin 11/29>>> Azithro 11/29>>> Vanc 11/29>>>  SIGNIFICANT EVENTS:   LINES/TUBES: OETT 11/29>>>  DISCUSSION: 81 yowm w/ complicated medical hx including CHF, prob COPD and OSA. Admitted 11/28 w/ CAP and Sepsis. Was volume resuscitated, abx initiated and admitted to the SDU. Shortly after admission developed progressive respiratory distress w/ copious sputum production. Moved to the ICU for progressive respiratory failure. Suspect that this is a mix of progressive ALI vs Pulmonary edema vs mix of the three. Now intubated. Will add vanc to CAP coverage and check CVP. Will eventually need diuresis if BP remains stable.   ASSESSMENT / PLAN:  PULMONARY A: Acute hypoxic respiratory failure in setting of CAP c/b  either evolving ALI vs pulmonary edema vs mix of both  >thick purulent mucous will not allow for BIPAP  P:   SBT to extubate today. See ID section. Titrate O2 for sat of 88-92%.  CARDIOVASCULAR A:  Severe sepsis.  H/o CHF r/o pulmonary edema  P:  KVO IVF. Hold further lasix. KCl PO as ordered. Cycle CEs positive but dropping.  RENAL A:   AKI - Cr rising. Lactic acidosis (mild). Now s/p volume resuscitation. P:   Renal dose meds Strict I&O F/u chemistry in am Hold lasix for now.  GASTROINTESTINAL A:   Obesity  H/o GERD P:   PPI Swallow evaluation.  HEMATOLOGIC A:   Anemia  Mild thrombocytopenia  P:  LMWH Trend cbc Transfuse per typical protocol   INFECTIOUS A:   Probable CAP Sepsis  P:   BCX2 11/29>>>NTD Sputum 11/29>>>NTD U strep 11/29>>>Neg U legionella  11/29>>>Neg  Rocephin 11/29>>> Azitho 11/29>>> Vanc 11/29>>>  ENDOCRINE A:   DM w/ hyperglycemia  P:   SSI protocol   NEUROLOGIC A:   Acute Encephalopathy in setting of sepsis and hypoxia P:   D/C sedation. Treat sepsis. PAD protocol   FAMILY  - Updates: daughter updated bedside.   - Inter-disciplinary family meet or Palliative Care meeting due by:  12/6  The patient is critically ill with multiple organ systems failure and requires high complexity decision making for assessment and support, frequent evaluation and titration of therapies, application of advanced monitoring technologies and extensive interpretation of multiple databases.   Critical Care Time devoted to patient care services described in this note is 35 Minutes. This time reflects time of care of this signee Dr Jennet Maduro. This critical care time does not reflect procedure time, or teaching time or supervisory time of PA/NP/Med student/Med Resident etc but could involve care discussion time.  Rush Farmer, M.D. [facility] Pulmonary/Critical Care Medicine. Pager: [phone]. After hours pager: [phone].    08/20/2015, 10:58 AM

## 2015-08-20 NOTE — Procedures (Signed)
Extubation Procedure Note  Patient Details:   Name: Christopher Holder DOB: 10/20/1929 MRN: [id number]   Airway Documentation:     Evaluation  O2 sats: stable throughout Complications: No apparent complications Patient did tolerate procedure well. Bilateral Breath Sounds: Rhonchi   Yes  4l/min [place]   Revonda Standard 08/20/2015, 12:16 PM

## 2015-08-20 NOTE — Care Management Note (Signed)
Case Management Note  Patient Details  Name: Christopher Holder MRN: [id number] Date of Birth: 1930-05-19  Subjective/Objective:   Pt admitted on 08/18/15 with CAP, sepsis, and respiratory failure.  PTA, pt resided at home with granddaughter.                     Action/Plan: Pt extubated today.  PT/OT consults pending.  Will follow for discharge planning as pt progresses.    Expected Discharge Date:                  Expected Discharge Plan:  Skilled Nursing Facility  In-House Referral:  Clinical Social Work  Discharge planning Services  CM Consult  Post Acute Care Choice:    Choice offered to:     DME Arranged:    DME Agency:     HH Arranged:    [place] Agency:     Status of Service:  In process, will continue to follow  Medicare Important Message Given:    Date Medicare IM Given:    Medicare IM give by:    Date Additional Medicare IM Given:    Additional Medicare Important Message give by:     If discussed at [place] of Stay Meetings, dates discussed:    Additional Comments:  Reinaldo Raddle, RN, BSN  Trauma/Neuro ICU Case Manager [phone]

## 2015-08-20 NOTE — Progress Notes (Signed)
[place] Progress Note Patient Name: Robrt Nesler DOB: 1930-04-19 MRN: [id number]   Date of Service  08/20/2015  HPI/Events of Note  Request for Posey belt restraint.   eICU Interventions  Will order Posey belt restraint.      Intervention Category Major Interventions: Delirium, psychosis, severe agitation - evaluation and management  Sommer,Steven Eugene 08/20/2015, 8:46 PM

## 2015-08-20 NOTE — Progress Notes (Signed)
Midazolam drip 20 ml wasted in sink; witnessed by Hendricks Milo, RN.

## 2015-08-21 ENCOUNTER — Ambulatory Visit (HOSPITAL_COMMUNITY): Payer: Medicare Other

## 2015-08-21 DIAGNOSIS — R509 Fever, unspecified: Secondary | ICD-10-CM

## 2015-08-21 LAB — BASIC METABOLIC PANEL
Anion gap: 11 (ref 5–15)
BUN: 23 mg/dL — AB (ref 6–20)
CHLORIDE: 106 mmol/L (ref 101–111)
CO2: 28 mmol/L (ref 22–32)
Calcium: 8.4 mg/dL — ABNORMAL LOW (ref 8.9–10.3)
Creatinine, Ser: 1.16 mg/dL (ref 0.61–1.24)
GFR calc Af Amer: >60 mL/min (ref >60)
GFR calc non Af Amer: 56 mL/min — ABNORMAL LOW (ref >60)
GLUCOSE: 128 mg/dL — AB (ref 65–99)
POTASSIUM: 4 mmol/L (ref 3.5–5.1)
Sodium: 145 mmol/L (ref 135–145)

## 2015-08-21 LAB — GLUCOSE, CAPILLARY
GLUCOSE-CAPILLARY: 124 mg/dL — AB (ref 65–99)
GLUCOSE-CAPILLARY: 123 mg/dL — AB (ref 65–99)
GLUCOSE-CAPILLARY: 134 mg/dL — AB (ref 65–99)
Glucose-Capillary: 126 mg/dL — ABNORMAL HIGH (ref 65–99)
Glucose-Capillary: 160 mg/dL — ABNORMAL HIGH (ref 65–99)
Glucose-Capillary: 171 mg/dL — ABNORMAL HIGH (ref 65–99)

## 2015-08-21 LAB — CBC
HCT: 34.4 % — ABNORMAL LOW (ref 39.0–52.0)
HEMOGLOBIN: 10.9 g/dL — AB (ref 13.0–17.0)
MCH: 29 pg (ref 26.0–34.0)
MCHC: 31.7 g/dL (ref 30.0–36.0)
MCV: 91.5 fL (ref 78.0–100.0)
Platelets: 169 10*3/uL (ref 150–400)
RBC: 3.76 MIL/uL — AB (ref 4.22–5.81)
RDW: 15.9 % — ABNORMAL HIGH (ref 11.5–15.5)
WBC: 7.3 10*3/uL (ref 4.0–10.5)

## 2015-08-21 LAB — MAGNESIUM: MAGNESIUM: 2.1 mg/dL (ref 1.7–2.4)

## 2015-08-21 LAB — PHOSPHORUS: Phosphorus: 3.4 mg/dL (ref 2.5–4.6)

## 2015-08-21 MED ORDER — DEXTROSE 5 % IV SOLN
500.0000 mg | INTRAVENOUS | Status: DC
  Administered 2015-08-21 – 2015-08-23 (×3): 500 mg via INTRAVENOUS
  Filled 2015-08-21 (×4): qty 500

## 2015-08-21 MED ORDER — DEXTROSE 5 % IV SOLN
2.0000 g | Freq: Three times a day (TID) | INTRAVENOUS | Status: DC
  Administered 2015-08-21 – 2015-08-26 (×15): 2 g via INTRAVENOUS
  Filled 2015-08-21 (×17): qty 2

## 2015-08-21 MED ORDER — ENSURE ENLIVE PO LIQD
237.0000 mL | Freq: Two times a day (BID) | ORAL | Status: DC
  Administered 2015-08-22 – 2015-09-01 (×18): 237 mL via ORAL

## 2015-08-21 MED ORDER — PANTOPRAZOLE SODIUM 40 MG PO TBEC
40.0000 mg | DELAYED_RELEASE_TABLET | Freq: Every day | ORAL | Status: DC
  Administered 2015-08-22 – 2015-09-01 (×10): 40 mg via ORAL
  Filled 2015-08-21 (×11): qty 1

## 2015-08-21 MED ORDER — PERFLUTREN LIPID MICROSPHERE
1.0000 mL | INTRAVENOUS | Status: AC | PRN
Start: 2015-08-21 — End: 2015-08-21
  Administered 2015-08-21: 2 mL via INTRAVENOUS
  Filled 2015-08-21: qty 10

## 2015-08-21 MED ORDER — AZITHROMYCIN 500 MG PO TABS
500.0000 mg | ORAL_TABLET | Freq: Every day | ORAL | Status: DC
  Filled 2015-08-21: qty 1

## 2015-08-21 NOTE — Progress Notes (Signed)
  Echocardiogram 2D Echocardiogramwith Definity has been performed.  Christopher Holder 08/21/2015, 3:34 PM

## 2015-08-21 NOTE — Evaluation (Signed)
Clinical/Bedside Swallow Evaluation Patient Details  Name: Christopher Holder MRN: [id number] Date of Birth: 1930/01/16  Today's Date: 08/21/2015 Time: SLP Start Time (ACUTE ONLY): 0945 SLP Stop Time (ACUTE ONLY): 1005 SLP Time Calculation (min) (ACUTE ONLY): 20 min  Past Medical History:  Past Medical History  Diagnosis Date  . Diabetes mellitus without complication ([place])   . CHF (congestive heart failure) ([place])   . Coronary artery disease   . COPD (chronic obstructive pulmonary disease) ([place])   . Asthma   . Hypertension   . HOH (hard of hearing)   . Sleep apnea   . Myocardial infarction ([place])   . Hyperlipidemia 05/27/2015  . GERD (gastroesophageal reflux disease) 05/27/2015  . AKI (acute kidney injury) ([place]) 08/18/2015   Past Surgical History:  Past Surgical History  Procedure Laterality Date  . Coronary angioplasty with stent placement    . Knee surgery    . Knee surgery      Knee replacement x2   HPI:  38 yowm w/ complicated medical hx including CHF, prob COPD and OSA, GERD. Admitted 11/28 w/ CAP and Sepsis. Was volume resuscitated, abx initiated and admitted to the SDU. Shortly after admission developed progressive respiratory distress w/ copious sputum production. Moved to the ICU for progressive respiratory failure, intubated 11/29-12/1.      Assessment / Plan / Recommendation Clinical Impression   Pt presents with a mild acute reversible dysphagia secondary to decreased MS with no focal deficits, no overt s/s of aspiration, and oral delays/inattention due to lethargy.  Recommend initiating dysphagia 1 diet, thin liquids, meds whole with puree.  If MS improves this weekend, diet can be advanced to mechanical soft.  SLP will follow.  Discussed results/recs with pt's daughter    Aspiration Risk  Mild aspiration risk    Diet Recommendation     Medication Administration: Whole meds with puree    Other  Recommendations Oral Care Recommendations: Oral care BID   Follow up  Recommendations  None    Frequency and Duration min 2x/week  1 week       Prognosis Prognosis for Safe Diet Advancement: Good      Swallow Study   General Date of Onset: 08/18/15 HPI: 89 yowm w/ complicated medical hx including CHF, prob COPD and OSA, GERD. Admitted 11/28 w/ CAP and Sepsis. Was volume resuscitated, abx initiated and admitted to the SDU. Shortly after admission developed progressive respiratory distress w/ copious sputum production. Moved to the ICU for progressive respiratory failure, intubated 11/29-12/1.    Type of Study: Bedside Swallow Evaluation Previous Swallow Assessment: nonr per records Diet Prior to this Study: NPO Temperature Spikes Noted: No Respiratory Status: Nasal cannula History of Recent Intubation: Yes Length of Intubations (days): 2 days Date extubated: 08/20/15 Behavior/Cognition: Lethargic/Drowsy Oral Cavity Assessment: Dried secretions Oral Care Completed by SLP: Yes Oral Cavity - Dentition: Adequate natural dentition Vision: Functional for self-feeding Self-Feeding Abilities: Needs assist Patient Positioning: Upright in bed Baseline Vocal Quality: Normal Volitional Cough: Strong Volitional Swallow: Able to elicit    Oral/Motor/Sensory Function Overall Oral Motor/Sensory Function: Within functional limits   Ice Chips     Thin Liquid Thin Liquid: Within functional limits Presentation: Cup;Self Fed    Nectar Thick Nectar Thick Liquid: Not tested   Honey Thick Honey Thick Liquid: Not tested   Puree Puree: Impaired Presentation: Spoon Oral Phase Impairments: Poor awareness of bolus Oral Phase Functional Implications: Prolonged oral transit   Solid Solid: Not tested  Juan Quam Laurice 08/21/2015,10:14 AM Estill Bamberg L. Tivis Ringer, [place] CCC/SLP Pager [phone]

## 2015-08-21 NOTE — Evaluation (Signed)
Occupational Therapy Evaluation Patient Details Name: Christopher Holder MRN: [id number] DOB: 1929/12/31 Today's Date: 08/21/2015    History of Present Illness pt presents with PNA and Sepsis.  pt with hx of CAD, COPD, HTN, DM, Neuropathies, and Hearing Loss.   Clinical Impression   Pt more asleep to start session, needed multiple attempts to arouse.  Very HOH but did OK with visual commands.  Unsure of cognitive impairment secondary to hearing difficulties, will continue to assess in function.  He was able to follow 30% on one step visual commands related to grooming tasks and bed mobility.  Needs +2 mod assist for sit to stand and stand pivot transfers.  Oxygen sats decreasing to 85% on one occasion post transfer but maintained 91-93 % after resting.  Noted they were 95% on 5-6Ls nasal cannula.  Feel pt will benefit from acute care OT to increase overall independence and endurance for selfcare tasks.  Pt will need 24 hour supervision/assistance depending on progress recommend SNF for follow-up if this cannot be provided.  Will continue to monitor for possible appropriateness for CIR as well.      Follow Up Recommendations  SNF;Supervision/Assistance - 24 hour    Equipment Recommendations  Other (comment) (TBD next venue of care)       Precautions / Restrictions Precautions Precautions: Fall Precaution Comments: pt very HOH even with Bil Hearing Aides.        Mobility Bed Mobility Overal bed mobility: Needs Assistance Bed Mobility: Supine to Sit     Supine to sit: Max assist     General bed mobility comments: Pt able to roll to his left side and then bring LEs off of the bed with min assist.  Needed mod assist for transitioning to sitting from [facility] elevated to 30 degrees.  Transfers Overall transfer level: Needs assistance Equipment used: 2 person hand held assist Transfers: Stand Pivot Transfers;Sit to/from Stand Sit to Stand: Max assist Stand pivot transfers: Max assist             Balance Overall balance assessment: Needs assistance Sitting-balance support: No upper extremity supported Sitting balance-Leahy Scale: Fair Sitting balance - Comments: Pt able to maintain static sitting balance EOB with min guard assist.   Standing balance support: During functional activity Standing balance-Leahy Scale: Poor Standing balance comment: Needed BUE support in standing with +2 mod assist.                            ADL Overall ADL's : Needs assistance/impaired Eating/Feeding: Supervision/ safety;Sitting Eating/Feeding Details (indicate cue type and reason): to drink from glass Grooming: Wash/dry face;Supervision/safety;Bed level   Upper Body Bathing: Minimal assitance;Sitting   Lower Body Bathing: Moderate assistance;Sit to/from stand;+2 for physical assistance   Upper Body Dressing : Moderate assistance;Sitting   Lower Body Dressing: Sit to/from stand;Moderate assistance;+2 for physical assistance;Maximal assistance Lower Body Dressing Details (indicate cue type and reason): Pt with decreased ability to reach either foot for dressing tasks. Toilet Transfer: +2 for physical assistance;Moderate assistance;Stand-pivot           Functional mobility during ADLs: Maximal assistance General ADL Comments: Pt very HOH but helped initiate rolling and OOB as well as standing for pivot transfer.  Maintains cervical and trunk flexion in standing.  He was able to initiate and wash his face when presented with washcloth and given visual cue.       Vision Vision Assessment?:  (Will further assess in function)  Perception Perception Perception Tested?: No   Praxis Praxis Praxis tested?: Not tested    Pertinent Vitals/Pain Pain Assessment: Faces Faces Pain Scale: Hurts a little bit Pain Location: LEs  Pain Descriptors / Indicators: Burning Pain Intervention(s): Repositioned     Hand Dominance Right   Extremity/Trunk Assessment Upper Extremity  Assessment Upper Extremity Assessment: Generalized weakness (Bilateral shoulder AROM 0-100 degree, all other joints AROM WFLs with strength 4+/5 on the right and 3+/5 on the left for elbow flexion/extension and grip)   Lower Extremity Assessment Lower Extremity Assessment: Defer to PT evaluation   Cervical / Trunk Assessment Cervical / Trunk Assessment: Kyphotic   Communication Communication Communication: HOH   Cognition Arousal/Alertness: Lethargic Behavior During Therapy: Impulsive Overall Cognitive Status: Impaired/Different from baseline Area of Impairment: Attention;Safety/judgement   Current Attention Level: Focused;Sustained   Following Commands: Follows one step commands inconsistently       General Comments: Difficult to fully assess cognition as pt very HOH even with hearing aides.                 Home Living Family/patient expects to be discharged to:: Skilled nursing facility                                        Prior Functioning/Environment          Comments: Lived with granddaughter, but unclear how much A she needed to provide.      OT Diagnosis: Generalized weakness;Cognitive deficits;Acute pain   OT Problem List: Decreased strength;Decreased range of motion;Decreased activity tolerance;Impaired balance (sitting and/or standing);Decreased safety awareness;Pain;Decreased cognition;Decreased knowledge of use of DME or AE;Cardiopulmonary status limiting activity   OT Treatment/Interventions: Self-care/ADL training;Therapeutic exercise;Patient/family education;Balance training;Neuromuscular education;Therapeutic activities;DME and/or AE instruction;Cognitive remediation/compensation    OT Goals(Current goals can be found in the care plan section) Acute Rehab OT Goals Patient Stated Goal: Pt did not state this session. OT Goal Formulation: Patient unable to participate in goal setting Time For Goal Achievement: 09/04/15 Potential to  Achieve Goals: Good  OT Frequency: Min 2X/week              End of Session Nurse Communication: Mobility status  Activity Tolerance: Patient limited by fatigue;Patient limited by lethargy Patient left: in chair;with call bell/phone within reach   Time: [id number] OT Time Calculation (min): 31 min Charges:  OT General Charges $OT Visit: 1 Procedure OT Evaluation $Initial OT Evaluation Tier I: 1 Procedure OT Treatments $Self Care/Home Management : 8-22 mins  Naome Brigandi OTR/L 08/21/2015, 4:56 PM

## 2015-08-21 NOTE — Evaluation (Signed)
Physical Therapy Evaluation Patient Details Name: Christopher Holder MRN: [id number] DOB: November 19, 1929 Today's Date: 08/21/2015   History of Present Illness  pt presents with PNA and Sepsis.  pt with hx of CAD, COPD, HTN, DM, Neuropathies, and Hearing Loss.  Clinical Impression  Difficult to assess pt's cognition as pt very HOH with family indicating he has only 10-20% of hearing with his hearing aides at baseline.  Attempted gestural cues, but pt does not consistently follow them either.  Pt lethargic in bed, but when sitting EOB becomes restless and grabbing on to PT and PT's clothing trying to pull himself up.  Firm cues and facilitation for safety.  Feel pt may need SNF level of rehab at D/C to maximize independence and decrease burden of care.  Will continue to follow.      Follow Up Recommendations SNF    Equipment Recommendations  None recommended by PT    Recommendations for Other Services       Precautions / Restrictions Precautions Precautions: Fall Precaution Comments: pt very HOH even with [employer] Hearing Aides.   Restrictions Weight Bearing Restrictions: No      Mobility  Bed Mobility Overal bed mobility: Needs Assistance;+2 for physical assistance Bed Mobility: Supine to Sit;Sit to Supine     Supine to sit: Mod assist;+2 for physical assistance Sit to supine: Mod assist;+2 for physical assistance   General bed mobility comments: pt lethargic and not initiating coming to sitting, but once PT and tech initiated pt did participate in bed mobility.  pt not consistently following verbal or gestural cues.    Transfers                    Ambulation/Gait                Stairs            Wheelchair Mobility    Modified Rankin (Stroke Patients Only)       Balance Overall balance assessment: Needs assistance Sitting-balance support: No upper extremity supported;Feet supported Sitting balance-Leahy Scale: Poor Sitting balance - Comments: pt restless at  EOB grabbing at PT and furniture.  pt needed firm cues and facilitation for using UEs appropriately.                                       Pertinent Vitals/Pain Pain Assessment: Faces Faces Pain Scale: Hurts even more Pain Location: R LE when touched, but pt unable to tell PT where pain is located. Pain Descriptors / Indicators: Grimacing Pain Intervention(s): Monitored during session;Repositioned (RN present and made aware of pain.  )    Home Living Family/patient expects to be discharged to:: Skilled nursing facility                      Prior Function           Comments: Lived with granddaughter, but unclear how much A she needed to provide.       Hand Dominance        Extremity/Trunk Assessment   Upper Extremity Assessment: Defer to OT evaluation           Lower Extremity Assessment: Generalized weakness;Difficult to assess due to impaired cognition      Cervical / Trunk Assessment: Kyphotic  Communication   Communication: HOH  Cognition Arousal/Alertness: Lethargic Behavior During Therapy: Impulsive Overall Cognitive Status: Impaired/Different from baseline  Area of Impairment: Orientation;Attention;Memory;Following commands;Safety/judgement;Awareness;Problem solving Orientation Level: Disoriented to;Place;Time;Situation Current Attention Level: Focused Memory: Decreased short-term memory Following Commands: Follows one step commands inconsistently Safety/Judgement: Decreased awareness of safety;Decreased awareness of deficits Awareness: Intellectual Problem Solving: Slow processing;Decreased initiation;Difficulty sequencing;Requires verbal cues;Requires tactile cues General Comments: Difficult to fully assess cognition as pt very HOH even with hearing aides.  pt is impulsive and grabs at PT trying to pull himself up.  Needs gestural cues, but does not follow gestures consistently.      General Comments      Exercises         Assessment/Plan    PT Assessment Patient needs continued PT services  PT Diagnosis Difficulty walking;Generalized weakness;Altered mental status   PT Problem List Decreased strength;Decreased activity tolerance;Decreased balance;Decreased mobility;Decreased coordination;Decreased cognition;Decreased knowledge of use of DME;Decreased safety awareness;Impaired sensation;Obesity;Pain  PT Treatment Interventions DME instruction;Gait training;Functional mobility training;Therapeutic activities;Therapeutic exercise;Balance training;Cognitive remediation;Patient/family education   PT Goals (Current goals can be found in the Care Plan section) Acute Rehab PT Goals Patient Stated Goal: pt unable to state. PT Goal Formulation: Patient unable to participate in goal setting Time For Goal Achievement: 09/04/15 Potential to Achieve Goals: Fair    Frequency Min 2X/week   Barriers to discharge        Co-evaluation               End of Session Equipment Utilized During Treatment: Oxygen Activity Tolerance: Patient limited by lethargy (Limited by cognition) Patient left: in bed;with call bell/phone within reach;with restraints reapplied;with nursing/sitter in room Nurse Communication: Mobility status         Time: [id number] PT Time Calculation (min) (ACUTE ONLY): 18 min   Charges:   PT Evaluation $Initial PT Evaluation Tier I: 1 Procedure     PT G CodesCatarina Hartshorn, [place] 08/21/2015, 10:51 AM

## 2015-08-21 NOTE — Progress Notes (Signed)
PULMONARY / CRITICAL CARE MEDICINE   Name: Christopher Holder MRN: [id number] DOB: 1929-12-02    ADMISSION DATE:  08/18/2015 CONSULTATION DATE:  11/29 REFERRING MD :  Tyrell Antonio   CHIEF COMPLAINT:  Acute respiratory failure   HISTORY OF PRESENT ILLNESS:   81 year old gentleman recently moved here from [place], has a past medical history of CAD followed by Dr. Marlou Porch and history of asthma versus COPD currently being worked up by Dr. Lamonte Sakai  Also has h/o diabetes on oral medications, sleep apnea on Nocturnal CPAP, hypertension, diabetic neuropathy, obesity, remote CAD and dyslipidemia. Presents to the ER on 11/29 after 2 separate falls that morning. Upon EMS arrival patient was febrile with a temperature of 101.1 orally and relative hypotension with a BP of 97/37. Patient was given a gram of Tylenol and transported to the hospital. According to the patient's family he had been having diarrhea that morning and had strong smelling urine. In the ER he was given normal saline fluids and was somewhat disoriented and noted to be significantly hard of hearing. He was also found to have a small abrasion on his left knee. In the ER patient's rectal temperature was 102.4, respirations were 25, pulse was 60 and he was in sinus rhythm, BP was 92/44, O2 sats were 88% on room air so oxygen was applied and saturations of subsequently increased to 97%. He  received 3 L of fluid in the ER. He was admitted to the SDU w/ working dx of CAP and sepsis. Not long after arrival to the SDU he was found more confused and hypoxic w/ O2 sats in low 80s on 4 liters. PCCM was asked to eval given concern for impending respiratory arrest.   SUBJECTIVE:  Sedate but easily arousable  VITAL SIGNS: BP 139/77 mmHg  Pulse 66  Temp(Src) 100.8 F (38.2 C) (Axillary)  Resp 22  Ht 6' (1.829 m)  Wt 125.8 kg (277 lb 5.4 oz)  BMI 37.61 kg/m2  SpO2 95%  HEMODYNAMICS: CVP:  [17 mmHg] 17 mmHg  VENTILATOR SETTINGS:    INTAKE / OUTPUT: I/O  last 3 completed shifts: In: 1523.7 [I.V.:463.7; NG/GT:760; IV Piggyback:300] Out: 3195 [Urine:3195]  PHYSICAL EXAMINATION: General:  81 year old male, confused but easily arousable. Neuro:  Awakens w/ stimulation, moves all ext, generalized weakness and confusion. HEENT:  NCAT, no JVD, PERRL, EOM-I and MMM. Cardiovascular:  rrr w/out MRG. Lungs:  Bibasilar crackles. Abdomen:  Obese + bowel sounds. Musculoskeletal:  -edema and -tenderness.  R leg warm to the touch. Skin:  Warm and dry.  LABS:  CBC  Recent Labs Lab 08/19/15 0505 08/20/15 0452 08/21/15 0606  WBC 10.8* 10.7* 7.3  HGB 11.5* 11.5* 10.9*  HCT 35.1* 35.1* 34.4*  PLT 152 172 169   Coag's  Recent Labs Lab 08/18/15 1720  APTT 39*  INR 1.64*   BMET  Recent Labs Lab 08/19/15 1845 08/20/15 0452 08/21/15 0606  NA 141 144 145  K 4.1 4.1 4.0  CL 108 108 106  CO2 25 27 28   BUN 23* 31* 23*  CREATININE 1.39* 1.41* 1.16  GLUCOSE 188* 142* 128*   Electrolytes  Recent Labs Lab 08/19/15 1845 08/20/15 0452 08/21/15 0606  CALCIUM 8.5* 8.5* 8.4*  MG 2.4 2.3 2.1  PHOS  --  3.0 3.4   Sepsis Markers  Recent Labs Lab 08/18/15 0852 08/18/15 1640 08/18/15 1647 08/19/15 0505 08/20/15 0452  LATICACIDVEN 2.19*  --  1.4  --   --   PROCALCITON  --  1.81  --  2.74 2.61   ABG  Recent Labs Lab 08/18/15 1656 08/19/15 0340 08/20/15 0440  PHART 7.322* 7.453* 7.482*  PCO2ART 46.4* 32.3* 31.3*  PO2ART 281* 152* 94.1   Liver Enzymes  Recent Labs Lab 08/18/15 0838 08/18/15 1640 08/19/15 0505  AST 19 21 22   ALT 13* 15* 13*  ALKPHOS 77 76 72  BILITOT 1.7* 1.2 0.5  ALBUMIN 3.0* 2.7* 2.5*   Cardiac Enzymes  Recent Labs Lab 08/19/15 0044 08/19/15 0505 08/19/15 1405  TROPONINI 0.40* 0.31* 0.27*   Glucose  Recent Labs Lab 08/20/15 1128 08/20/15 1522 08/20/15 1950 08/20/15 2326 08/21/15 0327 08/21/15 1137  GLUCAP 116* 127* 83 124* 123* 134*    Imaging No results found.worsening R>L  airspace disease    STUDIES:  ECHO 11/29>>>  CULTURES: Sputum 11/29>>>NTD BCX2 11/29>>>NTD U strep 11/29>>>Neg U legionella 11/29>>Neg Influenza panel 11/29: neg   ANTIBIOTICS: Rocephin 11/29>>>12/2 Ceftaz 12/2>>> Azithro 11/29>>> Vanc 11/29>>>  SIGNIFICANT EVENTS: 11/29 intubated 12/1 extubated 12/2 confused  LINES/TUBES: OETT 11/29>>>  DISCUSSION: 68 yowm w/ complicated medical hx including CHF, prob COPD and OSA. Admitted 11/28 w/ CAP and Sepsis. Was volume resuscitated, abx initiated and admitted to the SDU. Shortly after admission developed progressive respiratory distress w/ copious sputum production. Moved to the ICU for progressive respiratory failure. Suspect that this is a mix of progressive ALI vs Pulmonary edema vs mix of the three. Now intubated. Will add vanc to CAP coverage and check CVP. Will eventually need diuresis if BP remains stable.   ASSESSMENT / PLAN:  PULMONARY A: Acute hypoxic respiratory failure in setting of CAP c/b either evolving ALI vs pulmonary edema vs mix of both  >thick purulent mucous will not allow for BIPAP  P:   Monitor closely for airway protection. See ID section. Keep on the dry side. Titrate O2 for sat of 88-92%.  CARDIOVASCULAR A:  Severe sepsis.  H/o CHF r/o pulmonary edema  P:  KVO IVF. Hold further lasix. KCl PO as ordered. Cycle CEs positive but dropping, will not call cards.  RENAL A:   AKI - Cr normalizing. Lactic acidosis (mild). Now s/p volume resuscitation. P:   Renal dose meds. Strict I&O. F/u chemistry in am. Hold lasix for now.  GASTROINTESTINAL A:   Obesity  H/o GERD P:   PPI Swallow evaluation passed, feed only when mental status is normalized.  HEMATOLOGIC A:   Anemia  Mild thrombocytopenia  P:  LMWH Trend cbc Transfuse per typical protocol   INFECTIOUS A:   Probable CAP Sepsis  Concern for right leg cellulitis P:   BCX2 11/29>>>NTD Sputum 11/29>>>NTD U strep  11/29>>>Neg U legionella 11/29>>>Neg  Rocephin 11/29>>>12/2 Ceftaz 12/2>>> Azitho 11/29>>> Vanc 11/29>>>  ENDOCRINE A:   DM w/ hyperglycemia  P:   SSI protocol   NEUROLOGIC A:   Acute Encephalopathy in setting of sepsis and hypoxia. P:   D/C all sedation. Treat sepsis.  Hold in ICU overnight given risk of respiratory failure and due to AMS.  FAMILY  - Updates: daughter updated bedside.   - Inter-disciplinary family meet or Palliative Care meeting due by:  12/6  The patient is critically ill with multiple organ systems failure and requires high complexity decision making for assessment and support, frequent evaluation and titration of therapies, application of advanced monitoring technologies and extensive interpretation of multiple databases.   Critical Care Time devoted to patient care services described in this note is 35 Minutes. This time reflects time of care of this signee Dr Michaelene Song  Nelda Marseille. This critical care time does not reflect procedure time, or teaching time or supervisory time of PA/NP/Med student/Med Resident etc but could involve care discussion time.  Rush Farmer, M.D. [facility] Pulmonary/Critical Care Medicine. Pager: [phone]. After hours pager: [phone].   08/21/2015, 12:46 PM

## 2015-08-21 NOTE — Progress Notes (Signed)
Nutrition Follow-up  DOCUMENTATION CODES:   Obesity unspecified  INTERVENTION:   Ensure Enlive po BID, each supplement provides 350 kcal and 20 grams of protein  NUTRITION DIAGNOSIS:   Inadequate oral intake related to lethargy/confusion as evidenced by meal completion < 50%. Ongoing.   GOAL:   Patient will meet greater than or equal to 90% of their needs  Not yet met.   MONITOR:   PO intake, Supplement acceptance, Diet advancement, Weight trends, Labs  ASSESSMENT:   48 yowm w/ complicated medical hx including CHF, prob COPD and OSA. Admitted 11/28 w/ CAP and Sepsis.  Labs reviewed: CBG's: 83-127 Pt extubated 12/1 SLP eval this am and advanced diet.  Pt discussed during ICU rounds and with RN.  Per RN pt has been too lethargic to eat much but per SLP can advance diet further as pt wakes up.   Diet Order:  DIET - DYS 1 Room service appropriate?: Yes; Fluid consistency:: Thin  Skin:  Reviewed, no issues  Last BM:  12/1  Height:   Ht Readings from Last 1 Encounters:  08/18/15 6' (1.829 m)   Weight:   Wt Readings from Last 1 Encounters:  08/21/15 277 lb 5.4 oz (125.8 kg)   Ideal Body Weight:  80.9 kg  BMI:  Body mass index is 37.61 kg/(m^2).  Estimated Nutritional Needs:   Kcal:  2000-2200  Protein:  100-115 grams  Fluid:  > 2 L/day  EDUCATION NEEDS:   No education needs identified at this time  [place], [place], [place] Pager [phone] After Hours Pager

## 2015-08-21 NOTE — Progress Notes (Signed)
Dr. Nelda Marseille, MD made aware of pt. Lethargy and altered mental status; MD to bedside for further assessment. New abx orders; see eMAR for further details. Pt. daughter at bedside also expressing concern. Pt. Status and new plan of care reinforced to pt. Family member. No other new orders at this time. Will continue to monitor and update healthcare team as necessary.

## 2015-08-22 ENCOUNTER — Inpatient Hospital Stay (HOSPITAL_COMMUNITY): Payer: Medicare Other

## 2015-08-22 LAB — GLUCOSE, CAPILLARY
GLUCOSE-CAPILLARY: 131 mg/dL — AB (ref 65–99)
GLUCOSE-CAPILLARY: 155 mg/dL — AB (ref 65–99)
GLUCOSE-CAPILLARY: 129 mg/dL — AB (ref 65–99)
Glucose-Capillary: 140 mg/dL — ABNORMAL HIGH (ref 65–99)
Glucose-Capillary: 143 mg/dL — ABNORMAL HIGH (ref 65–99)
Glucose-Capillary: 142 mg/dL — ABNORMAL HIGH (ref 65–99)
Glucose-Capillary: 174 mg/dL — ABNORMAL HIGH (ref 65–99)

## 2015-08-22 LAB — BASIC METABOLIC PANEL
Anion gap: 9 (ref 5–15)
BUN: 17 mg/dL (ref 6–20)
CALCIUM: 8.1 mg/dL — AB (ref 8.9–10.3)
CO2: 28 mmol/L (ref 22–32)
Chloride: 107 mmol/L (ref 101–111)
Creatinine, Ser: 1.2 mg/dL (ref 0.61–1.24)
GFR calc Af Amer: >60 mL/min (ref >60)
GFR, EST NON AFRICAN AMERICAN: 54 mL/min — AB (ref >60)
Glucose, Bld: 152 mg/dL — ABNORMAL HIGH (ref 65–99)
POTASSIUM: 3.9 mmol/L (ref 3.5–5.1)
SODIUM: 144 mmol/L (ref 135–145)

## 2015-08-22 LAB — CBC
HCT: 32.9 % — ABNORMAL LOW (ref 39.0–52.0)
Hemoglobin: 10.3 g/dL — ABNORMAL LOW (ref 13.0–17.0)
MCH: 28.9 pg (ref 26.0–34.0)
MCHC: 31.3 g/dL (ref 30.0–36.0)
MCV: 92.2 fL (ref 78.0–100.0)
PLATELETS: 156 10*3/uL (ref 150–400)
RBC: 3.57 MIL/uL — AB (ref 4.22–5.81)
RDW: 16 % — ABNORMAL HIGH (ref 11.5–15.5)
WBC: 9.1 10*3/uL (ref 4.0–10.5)

## 2015-08-22 LAB — MAGNESIUM: MAGNESIUM: 2.2 mg/dL (ref 1.7–2.4)

## 2015-08-22 LAB — PHOSPHORUS: Phosphorus: 3.2 mg/dL (ref 2.5–4.6)

## 2015-08-22 MED ORDER — IPRATROPIUM-ALBUTEROL 0.5-2.5 (3) MG/3ML IN SOLN
3.0000 mL | Freq: Three times a day (TID) | RESPIRATORY_TRACT | Status: DC
  Administered 2015-08-22 – 2015-08-24 (×7): 3 mL via RESPIRATORY_TRACT
  Filled 2015-08-22 (×7): qty 3

## 2015-08-22 NOTE — Progress Notes (Signed)
PULMONARY / CRITICAL CARE MEDICINE   Name: Christopher Holder MRN: [id number] DOB: 1930/04/03    ADMISSION DATE:  08/18/2015 CONSULTATION DATE:  11/29 REFERRING MD :  Tyrell Antonio   CHIEF COMPLAINT:  Acute respiratory failure   HISTORY OF PRESENT ILLNESS:   81 year old gentleman recently moved here from [place], has a past medical history of CAD followed by Dr. Marlou Porch and history of asthma versus COPD currently being worked up by Dr. Lamonte Sakai  Also has h/o diabetes on oral medications, sleep apnea on Nocturnal CPAP, hypertension, diabetic neuropathy, obesity, remote CAD and dyslipidemia. Presents to the ER on 11/29 after 2 separate falls that morning. Upon EMS arrival patient was febrile with a temperature of 101.1 orally and relative hypotension with a BP of 97/37. Patient was given a gram of Tylenol and transported to the hospital. According to the patient's family he had been having diarrhea that morning and had strong smelling urine. In the ER he was given normal saline fluids and was somewhat disoriented and noted to be significantly hard of hearing. He was also found to have a small abrasion on his left knee. In the ER patient's rectal temperature was 102.4, respirations were 25, pulse was 60 and he was in sinus rhythm, BP was 92/44, O2 sats were 88% on room air so oxygen was applied and saturations of subsequently increased to 97%. He  received 3 L of fluid in the ER. He was admitted to the SDU w/ working dx of CAP and sepsis. Not long after arrival to the SDU he was found more confused and hypoxic w/ O2 sats in low 80s on 4 liters. PCCM was asked to eval given concern for impending respiratory arrest.   SUBJECTIVE:  Awake and interactive, very hard of hearing.  VITAL SIGNS: BP 91/54 mmHg  Pulse 74  Temp(Src) 98.4 F (36.9 C) (Oral)  Resp 26  Ht 6' (1.829 m)  Wt 125.7 kg (277 lb 1.9 oz)  BMI 37.58 kg/m2  SpO2 92%  HEMODYNAMICS:    VENTILATOR SETTINGS:  2L [place]  INTAKE / OUTPUT: I/O last 3  completed shifts: In: 1000 [P.O.:240; I.V.:360; IV Piggyback:400] Out: 1515 [Urine:1515]  PHYSICAL EXAMINATION: General:  81 year old male, awake and interactive, moving all ext to command.  Complaining about diet. Neuro:  Awake, moving all ext to command. HEENT:  [place]/AT, no JVD, PERRL, EOM-I and MMM. Cardiovascular:  rrr w/out MRG. Lungs:  Bibasilar crackles. Abdomen:  Obese + bowel sounds. Musculoskeletal:  -edema and -tenderness.  R leg warm to the touch. Skin:  Warm and dry.  LABS:  CBC  Recent Labs Lab 08/20/15 0452 08/21/15 0606 08/22/15 0555  WBC 10.7* 7.3 9.1  HGB 11.5* 10.9* 10.3*  HCT 35.1* 34.4* 32.9*  PLT 172 169 156   Coag's  Recent Labs Lab 08/18/15 1720  APTT 39*  INR 1.64*   BMET  Recent Labs Lab 08/20/15 0452 08/21/15 0606 08/22/15 0555  NA 144 145 144  K 4.1 4.0 3.9  CL 108 106 107  CO2 27 28 28   BUN 31* 23* 17  CREATININE 1.41* 1.16 1.20  GLUCOSE 142* 128* 152*   Electrolytes  Recent Labs Lab 08/20/15 0452 08/21/15 0606 08/22/15 0555  CALCIUM 8.5* 8.4* 8.1*  MG 2.3 2.1 2.2  PHOS 3.0 3.4 3.2   Sepsis Markers  Recent Labs Lab 08/18/15 0852 08/18/15 1640 08/18/15 1647 08/19/15 0505 08/20/15 0452  LATICACIDVEN 2.19*  --  1.4  --   --   PROCALCITON  --  1.81  --  2.74 2.61   ABG  Recent Labs Lab 08/18/15 1656 08/19/15 0340 08/20/15 0440  PHART 7.322* 7.453* 7.482*  PCO2ART 46.4* 32.3* 31.3*  PO2ART 281* 152* 94.1   Liver Enzymes  Recent Labs Lab 08/18/15 0838 08/18/15 1640 08/19/15 0505  AST 19 21 22   ALT 13* 15* 13*  ALKPHOS 77 76 72  BILITOT 1.7* 1.2 0.5  ALBUMIN 3.0* 2.7* 2.5*   Cardiac Enzymes  Recent Labs Lab 08/19/15 0044 08/19/15 0505 08/19/15 1405  TROPONINI 0.40* 0.31* 0.27*   Glucose  Recent Labs Lab 08/21/15 1137 08/21/15 1607 08/21/15 1948 08/21/15 2310 08/22/15 0315 08/22/15 0802  GLUCAP 134* 160* 171* 140* 131* 143*    Imaging Dg Chest Port 1 View  08/22/2015  CLINICAL  DATA:  Acute respiratory failure with hypoxemia EXAM: PORTABLE CHEST 1 VIEW COMPARISON:  2 days ago FINDINGS: Bilateral airspace opacity has become progressively patchy, more typical of pneumonia. Inflation is stable to improved after tracheal and esophageal extubation. Stable left subclavian central line, tip at the SVC level. Unchanged cardiomegaly and vascular hilar enlargement. IMPRESSION: 1. Similar degree of bilateral pneumonia. 2. Probable superimposed edema. 3. Stable to improved inflation after extubation. Electronically Signed   By: Monte Fantasia M.D.   On: 08/22/2015 08:24  worsening R>L airspace disease    STUDIES:  ECHO 11/29>>>EF 25-30%  CULTURES: Sputum 11/29>>>NTD BCX2 11/29>>>NTD U strep 11/29>>>Neg U legionella 11/29>>Neg Influenza panel 11/29: neg   ANTIBIOTICS: Rocephin 11/29>>>12/2 Ceftaz 12/2>>> Azithro 11/29>>> Vanc 11/29>>>  SIGNIFICANT EVENTS: 11/29 intubated 12/1 extubated 12/2 confused  LINES/TUBES: OETT 11/29>>>12/1  I personally reviewed CXR, RLL infiltrate appreciated.  DISCUSSION: 34 yowm w/ complicated medical hx including CHF, prob COPD and OSA. Admitted 11/28 w/ CAP and Sepsis. Was volume resuscitated, abx initiated and admitted to the SDU. Shortly after admission developed progressive respiratory distress w/ copious sputum production. Moved to the ICU for progressive respiratory failure. Suspect that this is a mix of progressive ALI vs Pulmonary edema vs mix of the three. Now intubated. Will add vanc to CAP coverage and check CVP. Will eventually need diuresis if BP remains stable.   ASSESSMENT / PLAN:  PULMONARY A: Acute hypoxic respiratory failure in setting of CAP c/b either evolving ALI vs pulmonary edema vs mix of both  >thick purulent mucous will not allow for BIPAP  P:   Monitor closely for airway protection. See ID section. Keep on the dry side. Titrate O2 for sat of 88-92%.  CARDIOVASCULAR A:  Severe sepsis.  H/o CHF r/o  pulmonary edema  P:  KVO IVF. Hold further lasix for now. Tele monitoring.  RENAL A:   AKI - Cr normalizing. Lactic acidosis (mild). Now s/p volume resuscitation. P:   Renal dose meds. Strict I&O. F/u chemistry in am. Hold lasix for now. Replace electrolytes as indicated.  GASTROINTESTINAL A:   Obesity  H/o GERD P:   PPI. Heart healthy diet.  HEMATOLOGIC A:   Anemia  Mild thrombocytopenia  P:  LMWH Trend cbc Transfuse per typical protocol   INFECTIOUS A:   Probable CAP Sepsis  Concern for right leg cellulitis P:   BCX2 11/29>>>NTD Sputum 11/29>>>NTD U strep 11/29>>>Neg U legionella 11/29>>>Neg  Rocephin 11/29>>>12/2 Ceftaz 12/2>>> Azitho 11/29>>> Vanc 11/29>>>  ENDOCRINE A:   DM w/ hyperglycemia  P:   SSI protocol   NEUROLOGIC A:   Acute Encephalopathy in setting of sepsis and hypoxia. P:   D/C all sedation. Treat sepsis.  FAMILY  - Updates: daughter  updated bedside.   - Inter-disciplinary family meet or Palliative Care meeting due by:  12/6  Discussed with [facility]-MD and bedside RN.  Transfer to SDU and to [facility] with PCCM off 12/4.  Called to Dr. Sherral Hammers.  Rush Farmer, M.D. [facility] Pulmonary/Critical Care Medicine. Pager: [phone]. After hours pager: [phone].   08/22/2015, 10:38 AM

## 2015-08-22 NOTE — Care Management Important Message (Signed)
Important Message  Patient Details  Name: Christopher Holder MRN: [id number] Date of Birth: Apr 17, 1930   Medicare Important Message Given:  Yes    Nathen May 08/22/2015, 12:58 PM

## 2015-08-23 DIAGNOSIS — I472 Ventricular tachycardia: Secondary | ICD-10-CM

## 2015-08-23 DIAGNOSIS — H9193 Unspecified hearing loss, bilateral: Secondary | ICD-10-CM

## 2015-08-23 LAB — GLUCOSE, CAPILLARY
GLUCOSE-CAPILLARY: 119 mg/dL — AB (ref 65–99)
GLUCOSE-CAPILLARY: 143 mg/dL — AB (ref 65–99)
GLUCOSE-CAPILLARY: 136 mg/dL — AB (ref 65–99)
Glucose-Capillary: 124 mg/dL — ABNORMAL HIGH (ref 65–99)

## 2015-08-23 LAB — CULTURE, BLOOD (ROUTINE X 2)
CULTURE: NO GROWTH
Culture: NO GROWTH

## 2015-08-23 LAB — PHOSPHORUS: Phosphorus: 3 mg/dL (ref 2.5–4.6)

## 2015-08-23 LAB — BASIC METABOLIC PANEL
Anion gap: 8 (ref 5–15)
BUN: 14 mg/dL (ref 6–20)
CO2: 27 mmol/L (ref 22–32)
Calcium: 8.1 mg/dL — ABNORMAL LOW (ref 8.9–10.3)
Chloride: 105 mmol/L (ref 101–111)
Creatinine, Ser: 1.29 mg/dL — ABNORMAL HIGH (ref 0.61–1.24)
GFR calc Af Amer: 57 mL/min — ABNORMAL LOW (ref >60)
GFR, EST NON AFRICAN AMERICAN: 50 mL/min — AB (ref >60)
Glucose, Bld: 137 mg/dL — ABNORMAL HIGH (ref 65–99)
POTASSIUM: 3.6 mmol/L (ref 3.5–5.1)
SODIUM: 140 mmol/L (ref 135–145)

## 2015-08-23 LAB — CBC
HCT: 32 % — ABNORMAL LOW (ref 39.0–52.0)
Hemoglobin: 10.1 g/dL — ABNORMAL LOW (ref 13.0–17.0)
MCH: 28.9 pg (ref 26.0–34.0)
MCHC: 31.6 g/dL (ref 30.0–36.0)
MCV: 91.7 fL (ref 78.0–100.0)
PLATELETS: 171 10*3/uL (ref 150–400)
RBC: 3.49 MIL/uL — AB (ref 4.22–5.81)
RDW: 15.9 % — ABNORMAL HIGH (ref 11.5–15.5)
WBC: 8.5 10*3/uL (ref 4.0–10.5)

## 2015-08-23 LAB — MAGNESIUM: MAGNESIUM: 2.2 mg/dL (ref 1.7–2.4)

## 2015-08-23 MED ORDER — ASPIRIN 81 MG PO CHEW
81.0000 mg | CHEWABLE_TABLET | Freq: Every day | ORAL | Status: DC
  Administered 2015-08-24 – 2015-09-01 (×8): 81 mg via ORAL
  Filled 2015-08-23 (×8): qty 1

## 2015-08-23 MED ORDER — INSULIN ASPART 100 UNIT/ML ~~LOC~~ SOLN
0.0000 [IU] | Freq: Three times a day (TID) | SUBCUTANEOUS | Status: DC
  Administered 2015-08-24: 3 [IU] via SUBCUTANEOUS
  Administered 2015-08-24: 9 [IU] via SUBCUTANEOUS
  Administered 2015-08-24: 5 [IU] via SUBCUTANEOUS
  Administered 2015-08-24: 2 [IU] via SUBCUTANEOUS
  Administered 2015-08-25 (×2): 5 [IU] via SUBCUTANEOUS
  Administered 2015-08-25: 3 [IU] via SUBCUTANEOUS

## 2015-08-23 MED ORDER — FUROSEMIDE 10 MG/ML IJ SOLN
40.0000 mg | Freq: Once | INTRAMUSCULAR | Status: AC
  Administered 2015-08-23: 40 mg via INTRAVENOUS
  Filled 2015-08-23: qty 4

## 2015-08-23 MED ORDER — METHYLPREDNISOLONE SODIUM SUCC 125 MG IJ SOLR
60.0000 mg | Freq: Once | INTRAMUSCULAR | Status: AC
  Administered 2015-08-23: 60 mg via INTRAVENOUS
  Filled 2015-08-23: qty 2

## 2015-08-23 MED ORDER — ALLOPURINOL 100 MG PO TABS
100.0000 mg | ORAL_TABLET | Freq: Two times a day (BID) | ORAL | Status: DC
  Administered 2015-08-23 – 2015-09-02 (×20): 100 mg via ORAL
  Filled 2015-08-23 (×21): qty 1

## 2015-08-23 MED ORDER — SIMVASTATIN 20 MG PO TABS
20.0000 mg | ORAL_TABLET | Freq: Every day | ORAL | Status: DC
  Administered 2015-08-23 – 2015-09-02 (×10): 20 mg via ORAL
  Filled 2015-08-23 (×10): qty 1

## 2015-08-23 MED ORDER — GABAPENTIN 600 MG PO TABS
600.0000 mg | ORAL_TABLET | Freq: Three times a day (TID) | ORAL | Status: DC
  Administered 2015-08-23 – 2015-09-02 (×30): 600 mg via ORAL
  Filled 2015-08-23 (×33): qty 1

## 2015-08-23 MED ORDER — POTASSIUM CHLORIDE CRYS ER 20 MEQ PO TBCR
40.0000 meq | EXTENDED_RELEASE_TABLET | Freq: Once | ORAL | Status: AC
  Administered 2015-08-23: 40 meq via ORAL
  Filled 2015-08-23: qty 2

## 2015-08-23 MED ORDER — PRO-STAT SUGAR FREE PO LIQD
30.0000 mL | Freq: Three times a day (TID) | ORAL | Status: DC
  Administered 2015-08-23 – 2015-09-02 (×29): 30 mL via ORAL
  Filled 2015-08-23 (×27): qty 30

## 2015-08-23 MED ORDER — POTASSIUM CHLORIDE CRYS ER 10 MEQ PO TBCR
10.0000 meq | EXTENDED_RELEASE_TABLET | Freq: Two times a day (BID) | ORAL | Status: DC
Start: 2015-08-23 — End: 2015-09-02
  Administered 2015-08-23 – 2015-09-02 (×20): 10 meq via ORAL
  Filled 2015-08-23 (×20): qty 1

## 2015-08-23 MED ORDER — CARVEDILOL 6.25 MG PO TABS
6.2500 mg | ORAL_TABLET | Freq: Two times a day (BID) | ORAL | Status: DC
  Administered 2015-08-23 – 2015-08-30 (×15): 6.25 mg via ORAL
  Filled 2015-08-23 (×15): qty 1

## 2015-08-23 NOTE — Progress Notes (Signed)
Pt had 8 beat run of V-tach at approx 0410.  CCM notified, awaiting return call.  No new orders received at this time.  Will continue to monitor.  Gus Rankin 08/23/2015 6:33 AM

## 2015-08-23 NOTE — Progress Notes (Signed)
Attempted report to 2C.  

## 2015-08-23 NOTE — Progress Notes (Addendum)
PROGRESS NOTE    Christopher Holder [id number] DOB: June 01, 1930 DOA: 08/18/2015 PCP: Mackie Pai, PA-C  HPI/Brief narrative 81 y/o wm w/ complicated medical hx including CHF, prob COPD/asked mother, OSA on CPAP, DM, CAD, HTN, extremely hard of hearing, HLD & GERD. Admitted 11/29 w/ CAP and Sepsis. Was volume resuscitated, abx initiated and admitted to the SDU. Shortly after admission developed progressive respiratory distress w/ copious sputum production. Moved to the ICU for progressive respiratory failure. Suspect that this is a mix of progressive ALI vs Pulmonary edema vs mix of the both. Needed mechanical ventilation. Improved. Extubated 07/22/15. CCM transferred care to [facility] and stepdown unit on 11/4.  Assessment/Plan:  Acute hypoxic respiratory failure - Secondary to community acquired pneumonia complicated by either evolving acute lung injury versus pulmonary edema versus mixed of both - S/p VDRF. Extubated 07/22/15. - Pulmonary toilet. Titrate oxygen for saturations 88-92 percent. - No nightly CPAP or BiPAP due to thick purulent mucus. May start in a day or 2. - Monitor closely  Probable community-acquired pneumonia - Blood culture 2: Negative, sputum culture: Negative, urine culture: Negative, urine strep antigen: Negative, urine Legionella antigen: Negative - Antibiotics as below. Complete all antibiotics after day 7 i.e. 12/5 doses. - Improving.  COPD/asthma - Continue bronchodilators and antibiotics.  Severe sepsis - Secondary to community-acquired pneumonia. Improved. Sepsis physiology resolved.  Acute on chronic systolic CHF/cardiomyopathy - 2-D echo 08/21/15: LVEF 25-30 percent. Severe diffuse hypokinesis with no identifiable regional variations. Moderate AR - Complicated by volume resuscitation early on in admission for sepsis. - Appears volume overloaded. On Bumex at home. We will start IV Lasix 40 mg 1-monitor response for further dosing. - Will reinitiate home  ACEI ensuring that he remained stable after initiation of Lasix and carvedilol. - Strict intake output monitoring and daily weights.  Nonsustained VT - 8. Nonsustained VT noticed on 08/23/15 at 4:10 AM. - Resume home dose of carvedilol. Continue monitoring on telemetry. Replace potassium to >4. Magnesium 2.2.  Acute on stage III chronic kidney disease - Baseline creatinine may be in the 1.4 range. Admitted with creatinine of 1.7. Improved. - Monitor closely while diuretics reinitiated.  Lactic acidosis - Resolved.  GERD - Continue PPI  Obesity  Anemia - Stable.  Thrombocytopenia - Transient and resolved.  Type II DM with peripheral neuropathy - SSI. Controlled - As requested by patient, resume home dose of gabapentin.  Gout - Resume allopurinol. No flare at this time.  Extremely hard of hearing - Cannot hear much despite hearing aids. Communication by writing.  Acute encephalopathy - In the setting of sepsis and hypoxia. Seems to have resolved.  Elevated troponin/CAD - Probably from demand ischemia related to sepsis and acute respiratory failure. Flat trend. 2-D echo shows decreased EF but no prior echo to compare. Low EF may be his baseline too. No chest pain reported. Consider cardiology consultation in a.m. - EKG shows sinus rhythm with frequent PVCs, LBBB and no change since prior EKG. No EKG from PTA to compare.  Hyperlipidemia - Continue statins     DVT prophylaxis: Lovenox Code Status: Full Family Communication: None at bedside Disposition Plan: Transfer to stepdown unit 11/4   Consultants:  CCM-signed off  Procedures:  Extubated 11/2  Left subclavian catheter  Antibiotics:  Rocephin 11/29>>>12/1  Ceftaz 12/2>>>  Azitho 11/29>>>  Zosyn IV 1 dose on admission   Subjective: Feels better. Denies complaints. Denies dyspnea or chest pain. States that his appetite is coming back. Communicated by writing.  Objective:  Filed Vitals:    08/23/15 1200 08/23/15 1300 08/23/15 1400 08/23/15 1439  BP: 121/77 117/56 103/45 119/49  Pulse: 60 64 65 63  Temp: 99.4 F (37.4 C)   98.4 F (36.9 C)  TempSrc: Oral   Oral  Resp: 20 19 25 26   Height:      Weight:    126.4 kg (278 lb 10.6 oz)  SpO2: 91% 95% 93% 91%    Intake/Output Summary (Last 24 hours) at 08/23/15 1528 Last data filed at 08/23/15 1400  Gross per 24 hour  Intake   1395 ml  Output   1650 ml  Net   -255 ml   Filed Weights   08/22/15 0400 08/23/15 0500 08/23/15 1439  Weight: 125.7 kg (277 lb 1.9 oz) 125.2 kg (276 lb 0.3 oz) 126.4 kg (278 lb 10.6 oz)     Exam:  General exam: Pleasant elderly male lying comfortably propped up in bed. Extremely hard of hearing despite hearing aids. Respiratory system: Reduced breath sounds bilaterally with scattered occasional expiratory rhonchi and few basal crackles. No increased work of breathing. Cardiovascular system: S1 & S2 heard, RRR. JVD +. 1+ pitting bilateral leg edema. SEM 2 x 6 at the apex. Gastrointestinal system: Abdomen is nondistended, soft and nontender. Normal bowel sounds heard. Central nervous system: Alert and oriented. No focal neurological deficits. Extremities: Symmetric 5 x 5 power.   Data Reviewed: Basic Metabolic Panel:  Recent Labs Lab 08/19/15 1845 08/20/15 0452 08/21/15 0606 08/22/15 0555 08/23/15 0555  NA 141 144 145 144 140  K 4.1 4.1 4.0 3.9 3.6  CL 108 108 106 107 105  CO2 25 27 28 28 27   GLUCOSE 188* 142* 128* 152* 137*  BUN 23* 31* 23* 17 14  CREATININE 1.39* 1.41* 1.16 1.20 1.29*  CALCIUM 8.5* 8.5* 8.4* 8.1* 8.1*  MG 2.4 2.3 2.1 2.2 2.2  PHOS  --  3.0 3.4 3.2 3.0   Liver Function Tests:  Recent Labs Lab 08/18/15 0838 08/18/15 1640 08/19/15 0505  AST 19 21 22   ALT 13* 15* 13*  ALKPHOS 77 76 72  BILITOT 1.7* 1.2 0.5  PROT 5.9* 5.9* 5.6*  ALBUMIN 3.0* 2.7* 2.5*   No results for input(s): LIPASE, AMYLASE in the last 168 hours. No results for input(s): AMMONIA in  the last 168 hours. CBC:  Recent Labs Lab 08/18/15 0838 08/18/15 2020 08/19/15 0505 08/20/15 0452 08/21/15 0606 08/22/15 0555 08/23/15 0555  WBC 11.9* 12.6* 10.8* 10.7* 7.3 9.1 8.5  NEUTROABS 10.5* 11.5*  --   --   --   --   --   HGB 11.5* 11.1* 11.5* 11.5* 10.9* 10.3* 10.1*  HCT 36.0* 34.7* 35.1* 35.1* 34.4* 32.9* 32.0*  MCV 91.6 92.5 92.1 91.2 91.5 92.2 91.7  PLT 143* 145* 152 172 169 156 171   Cardiac Enzymes:  Recent Labs Lab 08/19/15 0044 08/19/15 0505 08/19/15 1405  TROPONINI 0.40* 0.31* 0.27*   BNP (last 3 results) No results for input(s): PROBNP in the last 8760 hours. CBG:  Recent Labs Lab 08/22/15 2002 08/22/15 2313 08/23/15 0332 08/23/15 0723 08/23/15 1206  GLUCAP 174* 129* 119* 124* 143*    Recent Results (from the past 240 hour(s))  Culture, blood (routine x 2)     Status: None   Collection Time: 08/18/15  8:38 AM  Result Value Ref Range Status   Specimen Description BLOOD LEFT ARM  Final   Special Requests BOTTLES DRAWN AEROBIC AND ANAEROBIC 5CC  Final   Culture NO  GROWTH 5 DAYS  Final   Report Status 08/23/2015 FINAL  Final  Urine culture     Status: None   Collection Time: 08/18/15  8:38 AM  Result Value Ref Range Status   Specimen Description URINE, CATHETERIZED  Final   Special Requests NONE  Final   Culture NO GROWTH 1 DAY  Final   Report Status 08/19/2015 FINAL  Final  Culture, blood (routine x 2)     Status: None   Collection Time: 08/18/15  9:00 AM  Result Value Ref Range Status   Specimen Description BLOOD RIGHT ARM  Final   Special Requests BOTTLES DRAWN AEROBIC AND ANAEROBIC 10CC  Final   Culture NO GROWTH 5 DAYS  Final   Report Status 08/23/2015 FINAL  Final  MRSA PCR Screening     Status: None   Collection Time: 08/18/15  1:23 PM  Result Value Ref Range Status   MRSA by PCR NEGATIVE NEGATIVE Final    Comment:        The GeneXpert MRSA Assay (FDA approved for NASAL specimens only), is one component of a comprehensive  MRSA colonization surveillance program. It is not intended to diagnose MRSA infection nor to guide or monitor treatment for MRSA infections.   Culture, respiratory (NON-Expectorated)     Status: None   Collection Time: 08/18/15  5:14 PM  Result Value Ref Range Status   Specimen Description TRACHEAL ASPIRATE  Final   Special Requests NONE  Final   Gram Stain   Final    FEW WBC PRESENT,BOTH PMN AND MONONUCLEAR RARE SQUAMOUS EPITHELIAL CELLS PRESENT NO ORGANISMS SEEN Performed at [facility]    Culture   Final    NO GROWTH 2 DAYS Performed at [facility]    Report Status 08/20/2015 FINAL  Final      1.      Studies: Dg Chest Port 1 View  08/22/2015  CLINICAL DATA:  Acute respiratory failure with hypoxemia EXAM: PORTABLE CHEST 1 VIEW COMPARISON:  2 days ago FINDINGS: Bilateral airspace opacity has become progressively patchy, more typical of pneumonia. Inflation is stable to improved after tracheal and esophageal extubation. Stable left subclavian central line, tip at the SVC level. Unchanged cardiomegaly and vascular hilar enlargement. IMPRESSION: 1. Similar degree of bilateral pneumonia. 2. Probable superimposed edema. 3. Stable to improved inflation after extubation. Electronically Signed   By: Monte Fantasia M.D.   On: 08/22/2015 08:24        Scheduled Meds: . allopurinol  100 mg Oral BID  . aspirin  81 mg Per Tube Daily  . azithromycin  500 mg Intravenous Q24H  . budesonide (PULMICORT) nebulizer solution  0.5 mg Nebulization BID  . carvedilol  6.25 mg Oral BID WC  . cefTAZidime (FORTAZ)  IV  2 g Intravenous 3 times per day  . enoxaparin (LOVENOX) injection  40 mg Subcutaneous Daily  . feeding supplement (ENSURE ENLIVE)  237 mL Oral BID BM  . feeding supplement (PRO-STAT SUGAR FREE 64)  30 mL Per Tube TID  . gabapentin  600 mg Oral TID  . insulin aspart  0-9 Units Subcutaneous 6 times per day  . ipratropium-albuterol  3 mL Nebulization TID  .  pantoprazole  40 mg Oral Daily  . potassium chloride  10 mEq Oral BID  . simvastatin  20 mg Oral Daily  . sodium chloride  3 mL Intravenous Q12H   Continuous Infusions: . sodium chloride 10 mL/hr at 08/23/15 0800  . feeding supplement (  VITAL HIGH PROTEIN) Stopped (08/20/15 1040)  . midazolam (VERSED) infusion Stopped (08/20/15 1020)    Principal Problem:   Sepsis due to pneumonia [facility]) Active Problems:   Diabetes mellitus type 2, controlled ([place])   HTN (hypertension)   Hyperlipidemia   Diabetic neuropathy ([place])   Coronary artery disease involving native coronary artery of native heart without angina pectoris   Obesity   Obstructive sleep apnea   AKI (acute kidney injury) ([place])   Metabolic encephalopathy   Thrombocytopenia ([facility])   Acute respiratory failure with hypoxia ([facility])   Hard of hearing   Chronic sinus bradycardia   CAP (community acquired pneumonia)   Acute pulmonary edema ([place])   Septic shock ([place])    Time spent: 62 minutes    Alamin Mccuiston, MD, FACP, FHM. [facility] Pager [phone]  If 7PM-7AM, please contact night-coverage [email or web address] Password [email or web address] 08/23/2015, 3:28 PM    LOS: 5 days

## 2015-08-24 ENCOUNTER — Inpatient Hospital Stay (HOSPITAL_COMMUNITY): Payer: Medicare Other

## 2015-08-24 DIAGNOSIS — R9431 Abnormal electrocardiogram [ECG] [EKG]: Secondary | ICD-10-CM | POA: Insufficient documentation

## 2015-08-24 DIAGNOSIS — R001 Bradycardia, unspecified: Secondary | ICD-10-CM

## 2015-08-24 DIAGNOSIS — I2510 Atherosclerotic heart disease of native coronary artery without angina pectoris: Secondary | ICD-10-CM

## 2015-08-24 DIAGNOSIS — J9601 Acute respiratory failure with hypoxia: Secondary | ICD-10-CM | POA: Insufficient documentation

## 2015-08-24 DIAGNOSIS — H9190 Unspecified hearing loss, unspecified ear: Secondary | ICD-10-CM

## 2015-08-24 DIAGNOSIS — I5023 Acute on chronic systolic (congestive) heart failure: Secondary | ICD-10-CM

## 2015-08-24 DIAGNOSIS — I4581 Long QT syndrome: Secondary | ICD-10-CM

## 2015-08-24 LAB — BASIC METABOLIC PANEL
ANION GAP: 9 (ref 5–15)
BUN: 21 mg/dL — AB (ref 6–20)
CALCIUM: 8.2 mg/dL — AB (ref 8.9–10.3)
CHLORIDE: 104 mmol/L (ref 101–111)
CO2: 27 mmol/L (ref 22–32)
Creatinine, Ser: 1.4 mg/dL — ABNORMAL HIGH (ref 0.61–1.24)
GFR, EST AFRICAN AMERICAN: 52 mL/min — AB (ref >60)
GFR, EST NON AFRICAN AMERICAN: 45 mL/min — AB (ref >60)
Glucose, Bld: 197 mg/dL — ABNORMAL HIGH (ref 65–99)
Potassium: 4.6 mmol/L (ref 3.5–5.1)
SODIUM: 140 mmol/L (ref 135–145)

## 2015-08-24 LAB — GLUCOSE, CAPILLARY
GLUCOSE-CAPILLARY: 293 mg/dL — AB (ref 65–99)
GLUCOSE-CAPILLARY: 369 mg/dL — AB (ref 65–99)
GLUCOSE-CAPILLARY: 242 mg/dL — AB (ref 65–99)
Glucose-Capillary: 188 mg/dL — ABNORMAL HIGH (ref 65–99)

## 2015-08-24 MED ORDER — ALBUTEROL SULFATE (2.5 MG/3ML) 0.083% IN NEBU
3.0000 mL | INHALATION_SOLUTION | RESPIRATORY_TRACT | Status: DC | PRN
  Administered 2015-08-24: 3 mL via RESPIRATORY_TRACT
  Filled 2015-08-24: qty 3

## 2015-08-24 MED ORDER — FUROSEMIDE 10 MG/ML IJ SOLN
40.0000 mg | Freq: Three times a day (TID) | INTRAMUSCULAR | Status: DC
  Administered 2015-08-24 – 2015-08-25 (×3): 40 mg via INTRAVENOUS
  Filled 2015-08-24 (×3): qty 4

## 2015-08-24 MED ORDER — IPRATROPIUM-ALBUTEROL 0.5-2.5 (3) MG/3ML IN SOLN: 3.0000 mL | Freq: Four times a day (QID) | RESPIRATORY_TRACT | Status: DC

## 2015-08-24 MED ORDER — METHYLPREDNISOLONE SODIUM SUCC 125 MG IJ SOLR
60.0000 mg | Freq: Two times a day (BID) | INTRAMUSCULAR | Status: DC
  Administered 2015-08-24 – 2015-08-26 (×5): 60 mg via INTRAVENOUS
  Filled 2015-08-24 (×5): qty 2

## 2015-08-24 MED ORDER — IPRATROPIUM-ALBUTEROL 0.5-2.5 (3) MG/3ML IN SOLN
3.0000 mL | RESPIRATORY_TRACT | Status: DC
  Administered 2015-08-24 – 2015-08-27 (×19): 3 mL via RESPIRATORY_TRACT
  Filled 2015-08-24 (×20): qty 3

## 2015-08-24 NOTE — Progress Notes (Signed)
PULMONARY / CRITICAL CARE MEDICINE   Name: Christopher Holder MRN: [id number] DOB: 18-Sep-1930    ADMISSION DATE:  08/18/2015 CONSULTATION DATE:  11/29 REFERRING MD :  Tyrell Antonio   CHIEF COMPLAINT:  Acute respiratory failure  BRIEF 81 year old gentleman recently moved here from [place], has a past medical history of CAD followed by Dr. Marlou Porch and history of asthma versus COPD currently being worked up by Dr. Lamonte Sakai  Also has h/o diabetes on oral medications, sleep apnea on Nocturnal CPAP, hypertension, diabetic neuropathy, obesity, remote CAD and dyslipidemia. Presents to the ER on 11/29 after 2 separate falls that morning. Upon EMS arrival patient was febrile with a temperature of 101.1 orally and relative hypotension with a BP of 97/37. Patient was given a gram of Tylenol and transported to the hospital. According to the patient's family he had been having diarrhea that morning and had strong smelling urine. In the ER he was given normal saline fluids and was somewhat disoriented and noted to be significantly hard of hearing. He was also found to have a small abrasion on his left knee. In the ER patient's rectal temperature was 102.4, respirations were 25, pulse was 60 and he was in sinus rhythm, BP was 92/44, O2 sats were 88% on room air so oxygen was applied and saturations of subsequently increased to 97%. He  received 3 L of fluid in the ER. He was admitted to the SDU w/ working dx of CAP and sepsis. Not long after arrival to the SDU he was found more confused and hypoxic w/ O2 sats in low 80s on 4 liters. PCCM was asked to eval given concern for impending respiratory arrest 08/18/15  SIGNIFICANT EVENTS: 11/29 intubated. ECHO ef 25% 12/1 extubated 12/2 confused.  - Awake and interactive, very hard of hearing. PCCM signed off. Moved to SDU   SUBJECTIVE/OVERNIGHT/INTERVAL HX 08/24/15 - PCCM asked to re-evalaute because of ongoing wheezing despite nebs and lasix. Cards has been called. RN at bedside  reports mixture of extreme difficulty hearing (aidds not working) and some mild hypoactive delirium. They are not able to get his hearing aid working. No fever or rising wbc - on chart freviewd. On Abx. CXR with bilateral diffused  Airspace dz - similar to yesterday but improved from admit   VITAL SIGNS: BP 146/57 mmHg  Pulse 57  Temp(Src) 97.5 F (36.4 C) (Oral)  Resp 19  Ht 6\' 1"  (1.854 m)  Wt 125.6 kg (276 lb 14.4 oz)  BMI 36.54 kg/m2  SpO2 97%  HEMODYNAMICS:    VENTILATOR SETTINGS:  2L [place]  INTAKE / OUTPUT: I/O last 3 completed shifts: In: [id number] [P.O.:822; I.V.:233; IV Piggyback:400] Out: 2000 [Urine:2000]  PHYSICAL EXAMINATION: General:  81 year old male, awake and interactive, moving all ext to command.  Looks deconditioned Neuro:  Awake, EXTREME HOH + but despite that hypoactive delirium + HEENT:  [place]/AT, no JVD, PERRL, EOM-I and MMM. Audible Wheeze + ? Upper airway forced Cardiovascular:  rrr w/out MRG. Lungs:  Wheezing + but no increased wob Abdomen:  Obese + bowel sounds. Musculoskeletal:  -edema and -tenderness.  R leg warm to the touch. Skin:  Warm and dry.  LABS:  PULMONARY  Recent Labs Lab 08/18/15 1210 08/18/15 1450 08/18/15 1656 08/19/15 0340 08/20/15 0440  PHART 7.413 7.300* 7.322* 7.453* 7.482*  PCO2ART 38.0 48.6* 46.4* 32.3* 31.3*  PO2ART 71.0* 50.0* 281* 152* 94.1  HCO3 24.2* 23.2 22.7 22.2 23.2  TCO2 25 24.7 24.0 23.2 24.1  O2SAT 94.0 81.0 99.3  99.0 97.5    CBC  Recent Labs Lab 08/21/15 0606 08/22/15 0555 08/23/15 0555  HGB 10.9* 10.3* 10.1*  HCT 34.4* 32.9* 32.0*  WBC 7.3 9.1 8.5  PLT 169 156 171    COAGULATION  Recent Labs Lab 08/18/15 1720  INR 1.64*    CARDIAC   Recent Labs Lab 08/19/15 0044 08/19/15 0505 08/19/15 1405  TROPONINI 0.40* 0.31* 0.27*   No results for input(s): PROBNP in the last 168 hours.   CHEMISTRY  Recent Labs Lab 08/19/15 1845 08/20/15 0452 08/21/15 0606 08/22/15 0555 08/23/15 0555  08/24/15 0553  NA 141 144 145 144 140 140  K 4.1 4.1 4.0 3.9 3.6 4.6  CL 108 108 106 107 105 104  CO2 25 27 28 28 27 27   GLUCOSE 188* 142* 128* 152* 137* 197*  BUN 23* 31* 23* 17 14 21*  CREATININE 1.39* 1.41* 1.16 1.20 1.29* 1.40*  CALCIUM 8.5* 8.5* 8.4* 8.1* 8.1* 8.2*  MG 2.4 2.3 2.1 2.2 2.2  --   PHOS  --  3.0 3.4 3.2 3.0  --    Estimated Creatinine Clearance: 55.5 mL/min (by C-G formula based on Cr of 1.4).   LIVER  Recent Labs Lab 08/18/15 0838 08/18/15 1640 08/18/15 1720 08/19/15 0505  AST 19 21  --  22  ALT 13* 15*  --  13*  ALKPHOS 77 76  --  72  BILITOT 1.7* 1.2  --  0.5  PROT 5.9* 5.9*  --  5.6*  ALBUMIN 3.0* 2.7*  --  2.5*  INR  --   --  1.64*  --      INFECTIOUS  Recent Labs Lab 08/18/15 0852 08/18/15 1640 08/18/15 1647 08/19/15 0505 08/20/15 0452  LATICACIDVEN 2.19*  --  1.4  --   --   PROCALCITON  --  1.81  --  2.74 2.61     ENDOCRINE CBG (last 3)   Recent Labs  08/23/15 1206 08/23/15 1707 08/24/15 0612  GLUCAP 143* 136* 188*         IMAGING x48h  - image(s) personally visualized  -   highlighted in bold Dg Chest Port 1 View  08/24/2015  CLINICAL DATA:  81 year old male with shortness of breath EXAM: PORTABLE CHEST 1 VIEW COMPARISON:  Radiograph dated 08/22/2015 FINDINGS: Single-view of the chest demonstrate bilateral patchy airspace opacity, grossly stable or minimally increased compared to the prior study. Small right pleural effusion may be present. There is no pneumothorax. Stable cardiomegaly. Left subclavian central line with tip in stable positioning. The osseous structures are grossly unremarkable. IMPRESSION: Bilateral patchy airspace opacities, grossly stable or minimally increased since the prior study. Follow-up recommended. Electronically Signed   By: Anner Crete M.D.   On: 08/24/2015 02:42       DISCUSSION: 43 yowm w/ complicated medical hx including CHF, prob COPD and OSA. Admitted 11/28 w/ CAP and Sepsis.  Intubated1229, Extubated 08/20/15 and move to SDU 08/21/15. PCCM reconsutled 08/24/15 for wheezing + persistent bilateral air space dz. Has ongoing deliruim. Has adquate ID rx and no evidence of active infection REcommend diuresis + BiPAP + CT chest  ASSESSMENT / PLAN:  PULMONARY A: Acute hypoxic respiratory failure in setting of CAP c/b either evolving ALI vs pulmonary edema vs mix of both   - improved but not fully resolved. Has ongoing wheeze despite extubatino 08/20/15 and cxr findings.    P:   BiPAP to give positiv pressure Continue solumedrol O2 for pulse ox > 88% START AGGRESSIVE  LASIX - 40mg  tid Get CT chest wo contrast Nebs Q4h   CARDIOVASCULAR A:  Has hx of ch ronic systolic chf Might have acute on chronic systolic chf Long QTc on ekg 123/4/16 P:  KVO IVF.-> salien lock Tele monitoring. AGGRESSIVE lasix Await cards consult  RENAL A:   AKI - Cr normalizing. Lactic acidosis (mild). Now s/p volume resuscitation. P:   Renal dose meds. Strict I&O. F/u chemistry in am. Replace electrolytes as indicated.  GASTROINTESTINAL A:   Obesity  H/o GERD P:   PPI. Heart healthy diet.  HEMATOLOGIC A:   Anemia  Mild thrombocytopenia  P:  LMWH Trend cbc Transfuse per typical protocol   INFECTIOUS   CULTURES: Sputum 11/29>>>NTD BCX2 11/29>>>NTD U strep 11/29>>>Neg U legionella 11/29>>Neg Influenza panel 11/29: neg   A:   Probable CAP Sepsis  Concern for right leg cellulitis P:    Anti-infectives    Start     Dose/Rate Route Frequency Ordered Stop   08/21/15 1400  cefTAZidime (FORTAZ) 2 g in dextrose 5 % 50 mL IVPB     2 g 100 mL/hr over 30 Minutes Intravenous 3 times per day 08/21/15 1255     08/21/15 1230  azithromycin (ZITHROMAX) 500 mg in dextrose 5 % 250 mL IVPB     500 mg 250 mL/hr over 60 Minutes Intravenous Every 24 hours 08/21/15 1217     08/21/15 1200  azithromycin (ZITHROMAX) tablet 500 mg  Status:  Discontinued     500 mg Oral Daily  08/21/15 1026 08/21/15 1217   08/18/15 1200  azithromycin (ZITHROMAX) 500 mg in dextrose 5 % 250 mL IVPB  Status:  Discontinued     500 mg 250 mL/hr over 60 Minutes Intravenous Every 24 hours 08/18/15 1131 08/21/15 1026   08/18/15 1200  cefTRIAXone (ROCEPHIN) 1 g in dextrose 5 % 50 mL IVPB  Status:  Discontinued     1 g 100 mL/hr over 30 Minutes Intravenous Every 24 hours 08/18/15 1131 08/21/15 1255   08/18/15 1045  vancomycin (VANCOCIN) 2,000 mg in sodium chloride 0.9 % 500 mL IVPB  Status:  Discontinued     2,000 mg 250 mL/hr over 120 Minutes Intravenous  Once 08/18/15 1031 08/18/15 1130   08/18/15 1045  piperacillin-tazobactam (ZOSYN) IVPB 3.375 g     3.375 g 100 mL/hr over 30 Minutes Intravenous  Once 08/18/15 1031 08/18/15 1113     ENDOCRINE A:   DM w/ hyperglycemia  P:   SSI protocol   NEUROLOGIC A:   Acute Encephalopathy in setting of sepsis and hypoxia. HYPOACTIVE delirium - mild P:   D/C all sedation. Treat sepsis. Await QTC correction 0- then try haldol  FAMILY  - Updates: none at bedside 08/24/2015   - Inter-disciplinary family meet or Palliative Care meeting due by:  12/6     Dr. Brand Males, M.D., [facility]C.P Pulmonary and Critical Care Medicine Staff Physician [place] [facility] Pager: [phone], If no answer or between  15:00h - 7:00h: call [phone]  08/24/2015 9:27 AM

## 2015-08-24 NOTE — Progress Notes (Signed)
Patient is currently refusing SNF placement- states he gets along well at home using a rolling walker and would prefer to return home with home health services.  Pt states that he lives with grand daughter and gave permission for CSW to talk with her.    Grand daughter is ok with plan to return home as long as pt is able to take care of basic needs during the day when pt is at work.  CSW informed RNCM of pt wish to return home.  CSW will sign off if updated PT eval shows pt is more independent.  Domenica Reamer, [place] Social Worker [phone]

## 2015-08-24 NOTE — Progress Notes (Signed)
Occupational Therapy Treatment Patient Details Name: Christopher Holder MRN: [id number] DOB: 04-Oct-1929 Today's Date: 08/24/2015    History of present illness pt presents with PNA and Sepsis.  pt with hx of CAD, COPD, HTN, DM, Neuropathies, and Hearing Loss.   OT comments  This 81 yo male admitted with above presents to acute OT with continued decreased balance and decreased mobility thus affecting his independence with basic ADLs but is making progress towards goals. He will continue to benefit from acute OT with follow up at SNF.  Follow Up Recommendations  SNF;Supervision/Assistance - 24 hour    Equipment Recommendations   (TBD at next venue of care)       Precautions / Restrictions Precautions Precautions: Fall Precaution Comments: pt very HOH even with Bil Hearing Aides.   Restrictions Weight Bearing Restrictions: No       Mobility Bed Mobility Overal bed mobility: Needs Assistance Bed Mobility: Supine to Sit     Supine to sit: Min assist (with VCs for sequencing)        Transfers Overall transfer level: Needs assistance Equipment used: Rolling walker (2 wheeled) Transfers: Sit to/from Stand Sit to Stand: Mod assist         General transfer comment: ambulation min A +for safety with RW (started on 4 liters, turned up to 6 while ambulating, back to 4 liters when back in recliner in room)--see PT note for distances    Balance Overall balance assessment: Needs assistance Sitting-balance support: No upper extremity supported Sitting balance-Leahy Scale: Fair     Standing balance support: During functional activity Standing balance-Leahy Scale: Poor Standing balance comment: Reliant on RW                   ADL Overall ADL's : Needs assistance/impaired     Grooming: Wash/dry hands;Supervision/safety;Set up;Sitting                   Toilet Transfer: Moderate assistance (sit<>stand, with min A +2 for safety for stand pivot or ambulate to toilet)                               Cognition   Behavior During Therapy: [facility] for tasks assessed/performed Overall Cognitive Status: Within Functional Limits for tasks assessed                  General Comments: His HOH makes him at times seem that his cognition is off (but it is really his [facility])                 Pertinent Vitals/ Pain       Pain Assessment: No/denies pain         Frequency Min 2X/week     Progress Toward Goals  OT Goals(current goals can now be found in the care plan section)  Progress towards OT goals: Progressing toward goals     Plan Discharge plan remains appropriate       End of Session Equipment Utilized During Treatment: Gait belt;Rolling walker;Oxygen (started on 4, turned up to 6, back to 4 in room at end of session)   Activity Tolerance Patient tolerated treatment well   Patient Left in chair;with call bell/phone within reach   Nurse Communication          Time: [id number] OT Time Calculation (min): 28 min  Charges: OT General Charges $OT Visit: 1 Procedure OT Treatments $Self Care/Home Management : 8-22  mins  Almon Register [id number] 08/24/2015, 2:05 PM

## 2015-08-24 NOTE — Progress Notes (Signed)
Speech Language Pathology Treatment: Dysphagia  Patient Details Name: Christopher Holder MRN: [id number] DOB: October 06, 1929 Today's Date: 08/24/2015 Time: 1347-1400 SLP Time Calculation (min) (ACUTE ONLY): 13 min  Assessment / Plan / Recommendation Clinical Impression  Delightful man seen for dysphagia treatment during lunch with granddaughter present. SLP recommended food texture upgraded if pt's mental status improves and diet was upgraded to regular/thin on12/3. He required min verbal and visual cues to recognize and remove right buccal cavity pocketing. No s/s aspiration. Continue regular and thin liquids, lingual sweep to check for pocketing. No further ST needed.    HPI HPI: 73 yowm w/ complicated medical hx including CHF, prob COPD and OSA, GERD. Admitted 11/28 w/ CAP and Sepsis. Was volume resuscitated, abx initiated and admitted to the SDU. Shortly after admission developed progressive respiratory distress w/ copious sputum production. Moved to the ICU for progressive respiratory failure, intubated 11/29-12/1.         SLP Plan  Discharge SLP treatment due to (comment) (education completed)     Recommendations  Diet recommendations: Regular;Thin liquid Liquids provided via: Cup;Straw Medication Administration: Whole meds with puree Supervision: Patient able to self feed;Intermittent supervision to cue for compensatory strategies Compensations: Minimize environmental distractions;Small sips/bites;Slow rate;Lingual sweep for clearance of pocketing Postural Changes and/or Swallow Maneuvers: Seated upright 90 degrees              Oral Care Recommendations: Oral care BID Follow up Recommendations: None Plan: Discharge SLP treatment due to (comment) (education completed)   [place] Siren 08/24/2015, 3:14 PM  Orbie Pyo Colvin Caroli.Ed [employer] [phone]

## 2015-08-24 NOTE — Consult Note (Signed)
Patient ID: Christopher Holder MRN: [id number], DOB/AGE: 03/03/1930   Admit date: 08/18/2015   Primary Physician: Mackie Pai, PA-C Primary Cardiologist: Dr. Marlou Porch  Referring MD: Internal Medicine Reason for Consult: Chest Pain  Pt. Profile:  81 y/o male with known CAD s/p anterior MI in [id number] + chronic systolic CHF, HTN, HLD, DM, CKD, OSA and COPD admitted for sepsis and presumed CAP leading to acute respiratory failure requiring intubation. Cardiology asked to evaluate for chest pain.   Problem List  Past Medical History  Diagnosis Date  . Diabetes mellitus without complication ([place])   . CHF (congestive heart failure) ([place])   . Coronary artery disease   . COPD (chronic obstructive pulmonary disease) ([place])   . Asthma   . Hypertension   . HOH (hard of hearing)   . Sleep apnea   . Myocardial infarction ([place])   . Hyperlipidemia 05/27/2015  . GERD (gastroesophageal reflux disease) 05/27/2015  . AKI (acute kidney injury) ([place]) 08/18/2015    Past Surgical History  Procedure Laterality Date  . Coronary angioplasty with stent placement    . Knee surgery    . Knee surgery      Knee replacement x2     Allergies  Allergies  Allergen Reactions  . Percocet [Oxycodone-Acetaminophen] Diarrhea  . Sulfa Antibiotics Diarrhea and Nausea And Vomiting    HPI  81 y/o male with a h/o CAD, who recently established care with Dr. Marlou Porch in September 2016 after moving to the area from [place]. He was previously followed by Dr. Leonia Corona at [facility]. He was seen in the office 06/19/15. Per record, he had an anterior MI in 2004 and underwent PCI to the diagonal branch. Also with a h/o chronic systolic CHF with reported EF of 35-40%, per records in [place]. Also with a h/o HTN, HLD, DM, CKD, OAS, frequent PVCs and COPD. He was felt to be stable from a cardiac standpoint at time of office visit and was instructed to f/u in 6 months. He was instructed to continue medical therapy with ASA,  Coreg, Altace and simvastatin.   He presented to the [facility] ED on 08/18/15 after presenting with a fall. He was also febrile and hypotensive and with acute renal failure. IM admitted for sepsis and treated for presumed PNA. He was treated with IVFs and antibiotics. He later developed acute respiratory failure and required intubation. It was felt that his CAP was complicated by either evolving acute lung injury vs pulmonary edema. He was sucessfully extubated 08/21/15. 2D echo 08/21/15 showed EF of 25-30% with severe diffuse hypokinesis and moderate MR.   There are now new reports of chest pain. Cardiology asked to evaluate. Troponins mildly elevated x 3 at 0.40, 0.31, 0.21. EKG with LBBB (was present 05/2015). SCr today is 1.40. VSS.   The patient is very hard of hearing and a poor historian. However he currently denies any recent CP. He only notes mild dyspnea and wheezing. He does note that he never had any chest pain symptoms in 2004 when he had his MI. He only had profuse diaphoresis and malaise.   Also of importance, IM has asked cardiology to review EKG from 08/23/15 which showed a prolonged QT/QTc of 456/502 ms. This is also in the setting of a LBBB.    Home Medications  Prior to Admission medications   Medication Sig Start Date End Date Taking? Authorizing Provider  mometasone-formoterol (DULERA) 200-5 MCG/ACT AERO Inhale 2 puffs into the lungs 2 (two) times daily.  Yes Historical Provider, MD  NON FORMULARY Take 15 each by mouth Nightly. CPAP at night   Yes Historical Provider, MD  albuterol (PROVENTIL HFA;VENTOLIN HFA) 108 (90 BASE) MCG/ACT inhaler Inhale 2 puffs into the lungs every 4 (four) hours as needed for wheezing or shortness of breath.     Historical Provider, MD  allopurinol (ZYLOPRIM) 100 MG tablet Take 100 mg by mouth 2 (two) times daily.     Historical Provider, MD  aspirin 81 MG tablet Take 81 mg by mouth daily.    Historical Provider, MD  bumetanide (BUMEX) 2 MG tablet Take 3 mg  by mouth 2 (two) times daily.     Historical Provider, MD  carvedilol (COREG) 6.25 MG tablet Take 1 tablet (6.25 mg total) by mouth 2 (two) times daily with a meal. 08/17/15   Mackie Pai, PA-C  fluticasone (FLONASE) 50 MCG/ACT nasal spray Place 2 sprays into both nostrils daily.    Historical Provider, MD  gabapentin (NEURONTIN) 600 MG tablet 1 tab po tid Patient taking differently: Take 600 mg by mouth 3 (three) times daily.  07/21/15   Percell Miller [name], PA-C  glimepiride (AMARYL) 2 MG tablet Take 1 tablet (2 mg total) by mouth daily. 08/17/15   Percell Miller [name], PA-C  montelukast (SINGULAIR) 10 MG tablet Take 1 tablet (10 mg total) by mouth at bedtime. 07/21/15   Percell Miller [name], PA-C  omeprazole (PRILOSEC) 20 MG capsule Take 1 capsule (20 mg total) by mouth daily. 08/17/15   Percell Miller [name], PA-C  potassium chloride (K-DUR,KLOR-CON) 10 MEQ tablet Take 10 mEq by mouth 2 (two) times daily.    Historical Provider, MD  ramipril (ALTACE) 2.5 MG capsule Take 2.5 mg by mouth daily.    Historical Provider, MD  simvastatin (ZOCOR) 20 MG tablet Take 20 mg by mouth daily.    Historical Provider, MD    Family History  Family History  Problem Relation Age of Onset  . Cancer Mother   . Diabetes Father     Social History  Social History   Social History  . Marital Status: Widowed    Spouse Name: [name]  . Number of Children: [name]  . Years of Education: [name]   Occupational History  . Not on file.   Social History Main Topics  . Smoking status: Never Smoker   . Smokeless tobacco: Never Used  . Alcohol Use: No  . Drug Use: No  . Sexual Activity: Not on file   Other Topics Concern  . Not on file   Social History Narrative     Review of Systems General:  No chills, fever, night sweats or weight changes.  Cardiovascular:  No chest pain, dyspnea on exertion, edema, orthopnea, palpitations, paroxysmal nocturnal dyspnea. Dermatological: No rash, lesions/masses Respiratory: No cough,  dyspnea Urologic: No hematuria, dysuria Abdominal:   No nausea, vomiting, diarrhea, bright red blood per rectum, melena, or hematemesis Neurologic:  No visual changes, wkns, changes in mental status. All other systems reviewed and are otherwise negative except as noted above.  Physical Exam  Blood pressure 146/57, pulse 57, temperature 97.5 F (36.4 C), temperature source Oral, resp. rate 19, height 6\' 1"  (1.854 m), weight 276 lb 14.4 oz (125.6 kg), SpO2 97 %.  General: Pleasant, NAD, hard of hearing, dentition in poor repair Psych: Normal affect. Neuro: Alert and oriented X 3. Moves all extremities spontaneously. HEENT: Hard of hearing  Neck: Supple without bruits, mild JVD. Lungs:  Mildly labored breathing, diffuse expiratory wheezing  Heart: RRR no  s3, s4, or murmurs. Abdomen: Soft, non-tender, non-distended, BS + x 4.  Extremities: No clubbing, or cyanosis. Trace bilateral LEE. DP/PT/Radials 2+ and equal bilaterally.  Labs  Troponin (Point of Care Test) No results for input(s): TROPIPOC in the last 72 hours. No results for input(s): CKTOTAL, CKMB, TROPONINI in the last 72 hours. Lab Results  Component Value Date   WBC 8.5 08/23/2015   HGB 10.1* 08/23/2015   HCT 32.0* 08/23/2015   MCV 91.7 08/23/2015   PLT 171 08/23/2015    Recent Labs Lab 08/19/15 0505  08/24/15 0553  NA 141  < > 140  K 3.8  < > 4.6  CL 108  < > 104  CO2 25  < > 27  BUN 21*  < > 21*  CREATININE 1.57*  < > 1.40*  CALCIUM 8.3*  < > 8.2*  PROT 5.6*  --   --   BILITOT 0.5  --   --   ALKPHOS 72  --   --   ALT 13*  --   --   AST 22  --   --   GLUCOSE 189*  < > 197*  < > = values in this interval not displayed. No results found for: CHOL, HDL, LDLCALC, TRIG No results found for: DDIMER   Radiology/Studies  Dg Chest Port 1 View  08/24/2015  CLINICAL DATA:  80 year old male with shortness of breath EXAM: PORTABLE CHEST 1 VIEW COMPARISON:  Radiograph dated 08/22/2015 FINDINGS: Single-view of the  chest demonstrate bilateral patchy airspace opacity, grossly stable or minimally increased compared to the prior study. Small right pleural effusion may be present. There is no pneumothorax. Stable cardiomegaly. Left subclavian central line with tip in stable positioning. The osseous structures are grossly unremarkable. IMPRESSION: Bilateral patchy airspace opacities, grossly stable or minimally increased since the prior study. Follow-up recommended. Electronically Signed   By: Anner Crete M.D.   On: 08/24/2015 02:42   Dg Chest Port 1 View  08/22/2015  CLINICAL DATA:  Acute respiratory failure with hypoxemia EXAM: PORTABLE CHEST 1 VIEW COMPARISON:  2 days ago FINDINGS: Bilateral airspace opacity has become progressively patchy, more typical of pneumonia. Inflation is stable to improved after tracheal and esophageal extubation. Stable left subclavian central line, tip at the SVC level. Unchanged cardiomegaly and vascular hilar enlargement. IMPRESSION: 1. Similar degree of bilateral pneumonia. 2. Probable superimposed edema. 3. Stable to improved inflation after extubation. Electronically Signed   By: Monte Fantasia M.D.   On: 08/22/2015 08:24   Dg Chest Port 1 View  08/20/2015  CLINICAL DATA:  Endotracheal tube.  Sepsis.  History of CHF. EXAM: PORTABLE CHEST 1 VIEW COMPARISON:  Yesterday FINDINGS: Endotracheal tube tip between the clavicular heads and carina, stable from prior. There is a new orogastric tube which at least reaches the stomach. Left subclavian central line, tip at the SVC level. Unchanged patchy airspace disease of the bilateral chest, with hazy appearance of the left base which may be layering fluid. No evidence of pneumothorax. Stable cardiomegaly and vascular pedicle widening. IMPRESSION: 1. New orogastric tube is in good position. Stable positioning of pre-existing support apparatus. 2. Unchanged diffuse airspace disease, likely pulmonary edema and pneumonia. Electronically Signed    By: Monte Fantasia M.D.   On: 08/20/2015 07:25   Portable Chest Xray  08/19/2015  CLINICAL DATA:  Acute respiratory failure with hypoxia, intubated patient, coronary artery disease, sepsis, acute renal insufficiency. EXAM: PORTABLE CHEST 1 VIEW COMPARISON:  Portable chest x-ray of August 18, 2015 FINDINGS: The lungs are adequately inflated. The alveolar opacities bilaterally are slightly less conspicuous today. The cardiac silhouette remains enlarged. The pulmonary vascularity is engorged. The endotracheal tube tip lies approximately 3.5 cm above the carina. The left subclavian venous catheter tip projects over the midportion of the SVC. IMPRESSION: Slight interval improvement in the appearance of the pulmonary parenchyma with some resolution of interstitial edema or pneumonia. Significant abnormalities remain. The support tubes are in reasonable position. Electronically Signed   By: David  Martinique M.D.   On: 08/19/2015 07:25   Portable Chest Xray  08/18/2015  CLINICAL DATA:  Central line placement EXAM: PORTABLE CHEST 1 VIEW COMPARISON:  08/18/2015 FINDINGS: Endotracheal tube has been placed with tip 2.6 cm above the carina. Left subclavian central line tip projects over the superior vena cava. No pneumothorax. Extensive bilateral hazy and interstitial opacities suggesting edema. Cardiac enlargement. IMPRESSION: Increased severity of bilateral airspace disease. Support devices as described . Electronically Signed   By: Skipper Cliche M.D.   On: 08/18/2015 16:37   Dg Chest Port 1 View  08/18/2015  CLINICAL DATA:  Fever.  Multiple recent falls. EXAM: PORTABLE CHEST 1 VIEW COMPARISON:  None. FINDINGS: There is moderate interstitial edema with cardiomegaly and pulmonary venous hypertension. The small left effusion. There is no airspace consolidation. No adenopathy. No pneumothorax. No bone lesions. IMPRESSION: Congestive heart failure. No airspace consolidation. No pneumothorax. Electronically Signed    By: Lowella Grip III M.D.   On: 08/18/2015 09:32   Dg Abd Portable 1v  08/19/2015  CLINICAL DATA:  Orogastric tube placement EXAM: PORTABLE ABDOMEN - 1 VIEW COMPARISON:  None. FINDINGS: Orogastric tube has been placed and projects with tip just to the right of midline. IMPRESSION: Orogastric tube tip projects over the anticipated position of the distal body/ antrum of the stomach. Electronically Signed   By: Skipper Cliche M.D.   On: 08/19/2015 10:41    ECG  LBBB (present in 05/2015). SR with frequent PVCs  Echocardiogram 08/21/15  Study Conclusions  - Left ventricle: The cavity size was mildly dilated. There was mild concentric hypertrophy. Systolic function was severely reduced. The estimated ejection fraction was in the range of 25% to 30%. Severe diffuse hypokinesis with no identifiable regional variations. Acoustic contrast opacification revealed no evidence ofthrombus. - Aortic valve: There was moderate regurgitation. - Ascending aorta: The ascending aorta was mildly dilated. - Left atrium: The atrium was mildly dilated. - Right ventricle: The cavity size was mildly dilated. - Pulmonary arteries: Systolic pressure was mildly increased. PA peak pressure: 41 mm Hg (S).    ASSESSMENT AND PLAN  Principal Problem:   Sepsis due to pneumonia [facility]) Active Problems:   Diabetes mellitus type 2, controlled ([place])   HTN (hypertension)   Hyperlipidemia   Diabetic neuropathy ([place])   Coronary artery disease involving native coronary artery of native heart without angina pectoris   Obesity   Obstructive sleep apnea   AKI (acute kidney injury) ([place])   Metabolic encephalopathy   Thrombocytopenia ([facility])   Acute respiratory failure with hypoxia ([facility])   Hard of hearing   Chronic sinus bradycardia   CAP (community acquired pneumonia)   Acute pulmonary edema ([place])   Septic shock ([place])    1. CAD: s/p anterior MI in 2004 treated with PCI to the diagonal branch. He  denies any recent CP but with increased dyspnea, in the setting of CAP/ respiratory failure. He has mildly elevated troponins x 3 at 0.40, 0.31 and 0.27.  No EKG ordered today. EKG from 08/23/15 showed SR with PVCs.  2D echo shows reduced EF at 25-30% (35-40% per records from [place]). His lower EF and mildly elevated troponins may be stress induced from his CAP/ acute respiratory failure. However, it does not appear that he has had any recent ischemic w/u. Given his history, we need to exclude coronary ischemia. Will discuss with MD possibility of NST vs definitive [place] once respiratory status improves.   2. Chronic Systolic CHF/ Cardiomyopathy: EF 25-30%.  See recs above. For now continue medical therapy with Coreg. ACE-I currently on hold for mild renal insuffiencey. Continue lasix for diuresis.   3. Prolonged QT/QTc: IM has asked cardiology to review EKG from 08/23/15 which showed a prolonged QT/QTc of 456/502 ms. This is also in the setting of a LBBB. ? If truly prolonged QT interval. ? If ok to use PRN halodol which may prolong QT. MD to advise.    4. Acute Respiratory Failure: in the setting of CAP. Required Intubation. Extubated 08/21/15. Still with wheezing and mild dyspnea. Critical Care called to bedside earlier. Recs are for BiPAP, continued solumedrol and lasix. Chest CT pending,.   5. CAP: still with wheezing and mild dyspnea. On antibiotics. No fever or rising white count. Continued management per IM.  6. HTN: mildly elevated. Continue current regimen.   7. HLD: on simvastatin.  8. Renal Insufficieny: Scr 1.40 today. ACE-I on hold.   9. DM: per IM    Signed, Ameet Sandy, PA-C 08/24/2015, 9:22 AM

## 2015-08-24 NOTE — Progress Notes (Signed)
Pt still having audible wheezes and labored breathing although resp rate 25 and 02 saturation 91% on oxygen. On call paged and orders given to administer methylprednisolone 60mg  IV for one dose.  Orders carried out.  Will continue to monitor pt.

## 2015-08-24 NOTE — Progress Notes (Signed)
Physical Therapy Treatment Patient Details Name: Christopher Holder MRN: [id number] DOB: [birth year] Today's Date: 08/24/2015    History of Present Illness pt presents with PNA and Sepsis.  pt with hx of CAD, COPD, HTN, DM, Neuropathies, and Hearing Loss.    PT Comments    Pt admitted with above diagnosis. Pt currently with functional limitations due to balance and endurance deficits. Needs cues for safety awareness. Able to ambulate with chair follow.  Is progressing.  Pt will benefit from skilled PT to increase their independence and safety with mobility to allow discharge to the venue listed below.    Follow Up Recommendations  SNF     Equipment Recommendations  None recommended by PT    Recommendations for Other Services       Precautions / Restrictions Precautions Precautions: Fall Precaution Comments: pt very HOH even with Bil Hearing Aides.   Restrictions Weight Bearing Restrictions: No    Mobility  Bed Mobility Overal bed mobility: Needs Assistance Bed Mobility: Supine to Sit     Supine to sit: Min assist (with VCs for sequencing) Sit to supine: Mod assist;+2 for physical assistance   General bed mobility comments: Pt able to roll to his left side and then bring LEs off of the bed with min assist.  Needed mod assist for transitioning to sitting from [facility] elevated to 30 degrees.  Transfers Overall transfer level: Needs assistance Equipment used: Rolling walker (2 wheeled) Transfers: Sit to/from Stand Sit to Stand: Mod assist         General transfer comment: Needed assist to power up but did better with each attempt.  NEeds cues for hand placement.   Ambulation/Gait Ambulation/Gait assistance: Min assist;+2 safety/equipment Ambulation Distance (Feet): 140 Feet (ambulated 20 feet then 50 feet then 70 feet) Assistive device: Rolling walker (2 wheeled) Gait Pattern/deviations: Step-through pattern;Decreased stride length;Wide base of support   Gait velocity  interpretation: Below normal speed for age/gender General Gait Details: Followed pt with chair and pt needed 3 sitting rest breaks.  Cues to sequence steps and RW and occasional assist steering RW with turns.     Stairs            Wheelchair Mobility    Modified Rankin (Stroke Patients Only)       Balance Overall balance assessment: Needs assistance Sitting-balance support: No upper extremity supported;Feet supported Sitting balance-Leahy Scale: Fair Sitting balance - Comments: Pt able to maintain static sitting balance EOB with min guard assist.   Standing balance support: Bilateral upper extremity supported;During functional activity Standing balance-Leahy Scale: Poor Standing balance comment: relies on RW                    Cognition Arousal/Alertness: Awake/alert Behavior During Therapy: WFL for tasks assessed/performed Overall Cognitive Status: Within Functional Limits for tasks assessed                 General Comments: His HOH makes him at times seem that his cognition is off (but it is really his [facility])    Exercises      General Comments General comments (skin integrity, edema, etc.): Granddaughter came and fixed pts right hearing aid.       Pertinent Vitals/Pain Pain Assessment: No/denies pain  Had to incr pt to 6LO2 with ambulation to keep sats >90%.  Back to 4LO2 once in chair with sats >90%.    Home Living  Prior Function            PT Goals (current goals can now be found in the care plan section) Progress towards PT goals: Progressing toward goals    Frequency  Min 2X/week    PT Plan Current plan remains appropriate    Co-evaluation             End of Session Equipment Utilized During Treatment: Gait belt;Oxygen Activity Tolerance: Patient limited by fatigue Patient left: in chair;with call bell/phone within reach;with chair alarm set;with family/visitor present     Time: [id number] PT  Time Calculation (min) (ACUTE ONLY): 24 min  Charges:  $Gait Training: 8-22 mins                    G CodesDenice Paradise 09-Sep-2015, 4:11 PM Christopher Holder,PT Acute Rehabilitation [phone] (pager)

## 2015-08-24 NOTE — Progress Notes (Addendum)
PROGRESS NOTE    Christopher Holder [id number] DOB: 07/17/1930 DOA: 08/18/2015 PCP: Mackie Pai, PA-C  HPI/Brief narrative 81 y/o wm w/ complicated medical hx including CHF, prob COPD/asked mother, OSA on CPAP, DM, CAD, HTN, extremely hard of hearing, HLD & GERD. Admitted 11/29 w/ CAP and Sepsis. Was volume resuscitated, abx initiated and admitted to the SDU. Shortly after admission developed progressive respiratory distress w/ copious sputum production. Moved to the ICU for progressive respiratory failure. Suspect that this is a mix of progressive ALI vs Pulmonary edema vs mix of the both. Needed mechanical ventilation. Improved. Extubated 07/22/15. CCM transferred care to [facility] and stepdown unit on 11/4. Requested CCM to reevaluate/follow 11/5. Cardiology consulted 11/5.  Assessment/Plan:  Acute hypoxic respiratory failure - Secondary to community acquired pneumonia complicated by either evolving acute lung injury versus pulmonary edema versus mixed of both - S/p VDRF. Extubated 07/22/15. - Pulmonary toilet. Titrate oxygen for saturations 88-92 percent. - Had significant wheezing wheezing overnight 12/4. Added IV Solu-Medrol. CCM re-consulted for assistance. Flutter valve. - CCM aggressively diuresing with Lasix 40 mg IV 3 times a day, getting CT chest without contrast to further evaluate & PRN necessary BiPAP added. - Chest x-ray shows bilateral basal airspace opacities-as per daughter, patient has some chronic basal findings on imaging.  Probable community-acquired pneumonia - Blood culture 2: Negative, sputum culture: Negative, urine culture: Negative, urine strep antigen: Negative, urine Legionella antigen: Negative - Antibiotics as below. Day 7 antibiotics today. However given tenuous respiratory course, continue IV Tressie Ellis for now. DC azithromycin-received 6 doses so far. - Improving. - Follow CT chest.  COPD/asthma - Continue bronchodilators and antibiotics. Management as  above.  Severe sepsis - Secondary to community-acquired pneumonia. Improved. Sepsis physiology resolved.  Acute on chronic systolic CHF/cardiomyopathy - 2-D echo 08/21/15: LVEF 25-30 percent. Severe diffuse hypokinesis with no identifiable regional variations. Moderate AR - Complicated by volume resuscitation early on in admission for sepsis. - Appears volume overloaded. On Bumex at home. Treated with IV Lasix 40 mg 1 on 12/4-put out 1.5 L urine. Still appears clinically volume overloaded. Treating with aggressive diuresis/IV Lasix 40 mg 3 times a day despite mild elevation of creatinine. Monitor BMP closely. - Hold ACEI secondary to acute kidney injury. - Strict intake output monitoring and daily weights. - Cardiology formally consulted on 12/5-awaiting input. - As per echo on care everywhere, EF about the same in March 2016.  Nonsustained VT - 8. Nonsustained VT noticed on 08/23/15 at 4:10 AM.  - Resume home dose of carvedilol. Continue monitoring on telemetry. Replace potassium to >4. Magnesium 2.2.  Acute on stage III chronic kidney disease - Baseline creatinine may be in the 1.4 range. Admitted with creatinine of 1.7. Improved. - Monitor closely while diuretics reinitiated. - Creatinine has gone up from 1.29 > 1.40 in the last 24 hours after Lasix 1. Follow BMP in a.m.  Lactic acidosis - Resolved.  GERD - Continue PPI  Obesity  Anemia - Stable.  Thrombocytopenia - Transient and resolved.  Type II DM with peripheral neuropathy - SSI. Controlled - As requested by patient, resumed home dose of gabapentin. States that he slept well last night for a few hours.  Gout - Resume allopurinol. No flare at this time.  Extremely hard of hearing - Cannot hear much despite hearing aids. Communication by writing.  Acute encephalopathy/mild delirium - In the setting of sepsis and hypoxia.  - Would like to try Haldol but holding off secondary to prolonged QTC.  Elevated  troponin/CAD - Probably from demand ischemia related to sepsis and acute respiratory failure. Flat trend. 2-D echo shows decreased EF but no prior echo to compare. Low EF may be his baseline too. No chest pain reported. Consider cardiology consultation in a.m. - EKG shows sinus rhythm with frequent PVCs, LBBB and no change since prior EKG. No EKG from PTA to compare.  Hyperlipidemia - Continue statins  ? Right leg cellulitis Resolved  Prolonged QTC - Continue monitoring on telemetry. Potassium 4.6 and magnesium 2.2. ? Is it prolonged in the context of LBBB. Cardiology to opine.  - Minimize medications that might prolong QTc-DC azithromycin.     DVT prophylaxis: Lovenox Code Status: Full Family Communication: Discussed at length with patient's daughter Ms. [name] on 12/5. Disposition Plan: Remains critically ill. At risk for decline. Continue close monitoring and management in stepdown unit. Not stable for discharge.   Consultants:  CCM  Cardiology  Procedures:  Extubated 11/2  Left subclavian catheter  Antibiotics:  Rocephin 11/29>>>12/1  Ceftaz 12/2>>>  Azitho 11/29>>>  Zosyn IV 1 dose on admission   Subjective: States that his leg pains were better after gabapentin and he slept for a few hours. Wheezing overnight as per nursing.  Objective: Filed Vitals:   08/24/15 0500 08/24/15 0600 08/24/15 0800 08/24/15 0825  BP:  146/57    Pulse:  57    Temp:   97.5 F (36.4 C)   TempSrc:   Oral   Resp:  19    Height:      Weight: 125.6 kg (276 lb 14.4 oz)     SpO2:  93%  97%    Intake/Output Summary (Last 24 hours) at 08/24/15 1131 Last data filed at 08/24/15 [id number]  Gross per 24 hour  Intake   1383 ml  Output   1500 ml  Net   -117 ml   Filed Weights   08/23/15 0500 08/23/15 1439 08/24/15 0500  Weight: 125.2 kg (276 lb 0.3 oz) 126.4 kg (278 lb 10.6 oz) 125.6 kg (276 lb 14.4 oz)     Exam:  General exam: Pleasant elderly male lying propped up in  bed with mild increased work of breathing and audible wheezing. Extremely hard of hearing despite hearing aids. Respiratory system: Reduced breath sounds bilaterally with scattered medium pitched expiratory rhonchi and few basal crackles. Mild increased work of breathing. Cardiovascular system: S1 & S2 heard, RRR. JVD +. 1+ pitting bilateral leg edema. SEM 2 x 6 at the apex. Tele: SB 50-SR 60's, PVC's, 5 bt NSVT. Gastrointestinal system: Abdomen is nondistended, soft and nontender. Normal bowel sounds heard. Central nervous system: Alert and oriented. No focal neurological deficits. Extremities: Symmetric 5 x 5 power.   Data Reviewed: Basic Metabolic Panel:  Recent Labs Lab 08/19/15 1845 08/20/15 0452 08/21/15 0606 08/22/15 0555 08/23/15 0555 08/24/15 0553  NA 141 144 145 144 140 140  K 4.1 4.1 4.0 3.9 3.6 4.6  CL 108 108 106 107 105 104  CO2 25 27 28 28 27 27   GLUCOSE 188* 142* 128* 152* 137* 197*  BUN 23* 31* 23* 17 14 21*  CREATININE 1.39* 1.41* 1.16 1.20 1.29* 1.40*  CALCIUM 8.5* 8.5* 8.4* 8.1* 8.1* 8.2*  MG 2.4 2.3 2.1 2.2 2.2  --   PHOS  --  3.0 3.4 3.2 3.0  --    Liver Function Tests:  Recent Labs Lab 08/18/15 0838 08/18/15 1640 08/19/15 0505  AST 19 21 22   ALT 13* 15* 13*  ALKPHOS 77  76 72  BILITOT 1.7* 1.2 0.5  PROT 5.9* 5.9* 5.6*  ALBUMIN 3.0* 2.7* 2.5*   No results for input(s): LIPASE, AMYLASE in the last 168 hours. No results for input(s): AMMONIA in the last 168 hours. CBC:  Recent Labs Lab 08/18/15 0838 08/18/15 2020 08/19/15 0505 08/20/15 0452 08/21/15 0606 08/22/15 0555 08/23/15 0555  WBC 11.9* 12.6* 10.8* 10.7* 7.3 9.1 8.5  NEUTROABS 10.5* 11.5*  --   --   --   --   --   HGB 11.5* 11.1* 11.5* 11.5* 10.9* 10.3* 10.1*  HCT 36.0* 34.7* 35.1* 35.1* 34.4* 32.9* 32.0*  MCV 91.6 92.5 92.1 91.2 91.5 92.2 91.7  PLT 143* 145* 152 172 169 156 171   Cardiac Enzymes:  Recent Labs Lab 08/19/15 0044 08/19/15 0505 08/19/15 1405  TROPONINI 0.40*  0.31* 0.27*   BNP (last 3 results) No results for input(s): PROBNP in the last 8760 hours. CBG:  Recent Labs Lab 08/23/15 0332 08/23/15 0723 08/23/15 1206 08/23/15 1707 08/24/15 0612  GLUCAP 119* 124* 143* 136* 188*    Recent Results (from the past 240 hour(s))  Culture, blood (routine x 2)     Status: None   Collection Time: 08/18/15  8:38 AM  Result Value Ref Range Status   Specimen Description BLOOD LEFT ARM  Final   Special Requests BOTTLES DRAWN AEROBIC AND ANAEROBIC 5CC  Final   Culture NO GROWTH 5 DAYS  Final   Report Status 08/23/2015 FINAL  Final  Urine culture     Status: None   Collection Time: 08/18/15  8:38 AM  Result Value Ref Range Status   Specimen Description URINE, CATHETERIZED  Final   Special Requests NONE  Final   Culture NO GROWTH 1 DAY  Final   Report Status 08/19/2015 FINAL  Final  Culture, blood (routine x 2)     Status: None   Collection Time: 08/18/15  9:00 AM  Result Value Ref Range Status   Specimen Description BLOOD RIGHT ARM  Final   Special Requests BOTTLES DRAWN AEROBIC AND ANAEROBIC 10CC  Final   Culture NO GROWTH 5 DAYS  Final   Report Status 08/23/2015 FINAL  Final  MRSA PCR Screening     Status: None   Collection Time: 08/18/15  1:23 PM  Result Value Ref Range Status   MRSA by PCR NEGATIVE NEGATIVE Final    Comment:        The GeneXpert MRSA Assay (FDA approved for NASAL specimens only), is one component of a comprehensive MRSA colonization surveillance program. It is not intended to diagnose MRSA infection nor to guide or monitor treatment for MRSA infections.   Culture, respiratory (NON-Expectorated)     Status: None   Collection Time: 08/18/15  5:14 PM  Result Value Ref Range Status   Specimen Description TRACHEAL ASPIRATE  Final   Special Requests NONE  Final   Gram Stain   Final    FEW WBC PRESENT,BOTH PMN AND MONONUCLEAR RARE SQUAMOUS EPITHELIAL CELLS PRESENT NO ORGANISMS SEEN Performed at [facility]     Culture   Final    NO GROWTH 2 DAYS Performed at [facility]    Report Status 08/20/2015 FINAL  Final         Studies: Dg Chest Port 1 View  08/24/2015  CLINICAL DATA:  81 year old male with shortness of breath EXAM: PORTABLE CHEST 1 VIEW COMPARISON:  Radiograph dated 08/22/2015 FINDINGS: Single-view of the chest demonstrate bilateral patchy airspace opacity, grossly stable  or minimally increased compared to the prior study. Small right pleural effusion may be present. There is no pneumothorax. Stable cardiomegaly. Left subclavian central line with tip in stable positioning. The osseous structures are grossly unremarkable. IMPRESSION: Bilateral patchy airspace opacities, grossly stable or minimally increased since the prior study. Follow-up recommended. Electronically Signed   By: Anner Crete M.D.   On: 08/24/2015 02:42        Scheduled Meds: . allopurinol  100 mg Oral BID  . aspirin  81 mg Oral Daily  . azithromycin  500 mg Intravenous Q24H  . budesonide (PULMICORT) nebulizer solution  0.5 mg Nebulization BID  . carvedilol  6.25 mg Oral BID WC  . cefTAZidime (FORTAZ)  IV  2 g Intravenous 3 times per day  . enoxaparin (LOVENOX) injection  40 mg Subcutaneous Daily  . feeding supplement (ENSURE ENLIVE)  237 mL Oral BID BM  . feeding supplement (PRO-STAT SUGAR FREE 64)  30 mL Oral TID  . furosemide  40 mg Intravenous 3 times per day  . gabapentin  600 mg Oral TID  . insulin aspart  0-9 Units Subcutaneous TID WC  . ipratropium-albuterol  3 mL Nebulization Q4H  . methylPREDNISolone (SOLU-MEDROL) injection  60 mg Intravenous Q12H  . pantoprazole  40 mg Oral Daily  . potassium chloride  10 mEq Oral BID  . simvastatin  20 mg Oral Daily  . sodium chloride  3 mL Intravenous Q12H   Continuous Infusions:    Principal Problem:   Sepsis due to pneumonia [facility]) Active Problems:   Diabetes mellitus type 2, controlled ([place])   HTN (hypertension)   Hyperlipidemia    Diabetic neuropathy ([place])   Coronary artery disease involving native coronary artery of native heart without angina pectoris   Obesity   Obstructive sleep apnea   AKI (acute kidney injury) ([place])   Metabolic encephalopathy   Thrombocytopenia ([facility])   Acute respiratory failure with hypoxia ([facility])   Hard of hearing   Chronic sinus bradycardia   CAP (community acquired pneumonia)   Acute pulmonary edema ([place])   Septic shock ([place])    Time spent: 41 minutes    Cameron Katayama, MD, FACP, FHM. [facility] Pager [phone]  If 7PM-7AM, please contact night-coverage [email or web address] Password [email or web address] 08/24/2015, 11:31 AM    LOS: 6 days

## 2015-08-24 NOTE — Progress Notes (Signed)
Patient stated that he will get granddaughter to bring in his CPAP tomorrow.  Patient said he would wait to wear when he get his personal.  PT on 4 l O2 in no distress at this time. RT will continue to monitor.

## 2015-08-24 NOTE — Progress Notes (Signed)
Pt audibly wheezing and having labored breathing.  On call paged. Given orders to administer scheduled pulmocort early.  Orders carried out per respiratory.

## 2015-08-24 NOTE — Progress Notes (Signed)
Pt still wheezing now c/o SOB.  Diaphoretic and labored breathing with wheezes noted.  Resp rate still 19 and 02 sats 94%.  On call paged and orders to obtain chest xray.  Will give results when completed.  Will continue to monitor patient.

## 2015-08-25 ENCOUNTER — Inpatient Hospital Stay (HOSPITAL_COMMUNITY): Payer: Medicare Other

## 2015-08-25 ENCOUNTER — Encounter (HOSPITAL_COMMUNITY): Payer: Self-pay | Admitting: Radiology

## 2015-08-25 DIAGNOSIS — N189 Chronic kidney disease, unspecified: Secondary | ICD-10-CM

## 2015-08-25 DIAGNOSIS — I5021 Acute systolic (congestive) heart failure: Secondary | ICD-10-CM | POA: Insufficient documentation

## 2015-08-25 DIAGNOSIS — N179 Acute kidney failure, unspecified: Secondary | ICD-10-CM | POA: Insufficient documentation

## 2015-08-25 DIAGNOSIS — J80 Acute respiratory distress syndrome: Secondary | ICD-10-CM | POA: Insufficient documentation

## 2015-08-25 LAB — BASIC METABOLIC PANEL
ANION GAP: 9 (ref 5–15)
BUN: 40 mg/dL — AB (ref 6–20)
CHLORIDE: 105 mmol/L (ref 101–111)
CO2: 29 mmol/L (ref 22–32)
Calcium: 8.5 mg/dL — ABNORMAL LOW (ref 8.9–10.3)
Creatinine, Ser: 1.5 mg/dL — ABNORMAL HIGH (ref 0.61–1.24)
GFR calc Af Amer: 48 mL/min — ABNORMAL LOW (ref >60)
GFR calc non Af Amer: 41 mL/min — ABNORMAL LOW (ref >60)
GLUCOSE: 252 mg/dL — AB (ref 65–99)
POTASSIUM: 4.1 mmol/L (ref 3.5–5.1)
Sodium: 143 mmol/L (ref 135–145)

## 2015-08-25 LAB — GLUCOSE, CAPILLARY
GLUCOSE-CAPILLARY: 201 mg/dL — AB (ref 65–99)
GLUCOSE-CAPILLARY: 276 mg/dL — AB (ref 65–99)
GLUCOSE-CAPILLARY: 269 mg/dL — AB (ref 65–99)
GLUCOSE-CAPILLARY: 283 mg/dL — AB (ref 65–99)
Glucose-Capillary: 239 mg/dL — ABNORMAL HIGH (ref 65–99)

## 2015-08-25 LAB — CBC
HEMATOCRIT: 31.9 % — AB (ref 39.0–52.0)
HEMOGLOBIN: 10.5 g/dL — AB (ref 13.0–17.0)
MCH: 29.8 pg (ref 26.0–34.0)
MCHC: 32.9 g/dL (ref 30.0–36.0)
MCV: 90.6 fL (ref 78.0–100.0)
Platelets: 217 10*3/uL (ref 150–400)
RBC: 3.52 MIL/uL — ABNORMAL LOW (ref 4.22–5.81)
RDW: 15.9 % — ABNORMAL HIGH (ref 11.5–15.5)
WBC: 8 10*3/uL (ref 4.0–10.5)

## 2015-08-25 MED ORDER — INSULIN ASPART 100 UNIT/ML ~~LOC~~ SOLN
0.0000 [IU] | Freq: Every day | SUBCUTANEOUS | Status: DC
Start: 2015-08-25 — End: 2015-09-02
  Administered 2015-08-25: 3 [IU] via SUBCUTANEOUS
  Administered 2015-08-26: 2 [IU] via SUBCUTANEOUS
  Administered 2015-08-27: 4 [IU] via SUBCUTANEOUS
  Administered 2015-08-28 – 2015-09-01 (×3): 2 [IU] via SUBCUTANEOUS

## 2015-08-25 MED ORDER — INSULIN GLARGINE 100 UNIT/ML ~~LOC~~ SOLN
10.0000 [IU] | Freq: Every day | SUBCUTANEOUS | Status: DC
Start: 2015-08-25 — End: 2015-08-26
  Administered 2015-08-25: 10 [IU] via SUBCUTANEOUS
  Filled 2015-08-25 (×2): qty 0.1

## 2015-08-25 MED ORDER — INSULIN ASPART 100 UNIT/ML ~~LOC~~ SOLN
0.0000 [IU] | Freq: Three times a day (TID) | SUBCUTANEOUS | Status: DC
  Administered 2015-08-26: 7 [IU] via SUBCUTANEOUS
  Administered 2015-08-26: 2 [IU] via SUBCUTANEOUS
  Administered 2015-08-26: 7 [IU] via SUBCUTANEOUS
  Administered 2015-08-27: 3 [IU] via SUBCUTANEOUS
  Administered 2015-08-27 – 2015-08-30 (×6): 2 [IU] via SUBCUTANEOUS
  Administered 2015-08-30 – 2015-08-31 (×5): 1 [IU] via SUBCUTANEOUS
  Administered 2015-09-01: 2 [IU] via SUBCUTANEOUS

## 2015-08-25 MED ORDER — FUROSEMIDE 10 MG/ML IJ SOLN: 40.0000 mg | Freq: Every day | INTRAMUSCULAR | Status: DC

## 2015-08-25 NOTE — Progress Notes (Signed)
Inpatient Diabetes Program Recommendations  AACE/ADA: New Consensus Statement on Inpatient Glycemic Control (2015)  Target Ranges:  Prepandial:   less than 140 mg/dL      Peak postprandial:   less than 180 mg/dL (1-2 hours)      Critically ill patients:  140 - 180 mg/dL   Review of Glycemic Control  Results for Christopher Holder, Christopher Holder (MRN [id number]) as of 08/25/2015 09:50  Ref. Range 08/24/2015 09:34 08/24/2015 12:34 08/24/2015 16:22 08/24/2015 21:31 08/25/2015 08:30  Glucose-Capillary Latest Ref Range: 65-99 mg/dL 239 (H) 293 (H) 369 (H) 242 (H) 201 (H)  Results for Christopher Holder, Christopher Holder (MRN [id number]) as of 08/25/2015 09:50  Ref. Range 08/18/2015 08:38  Hemoglobin A1C Latest Ref Range: 4.8-5.6 % 6.2 (H)   On Solumedrol 60 Q12H.  Inpatient Diabetes Program Recommendations:   Correction (SSI): Increase Novolog to moderate tidwc and hs Insulin - Meal Coverage: Add Novolog 3 units tidwc for meal coverage insulin if pt eats > 50% meal - while on steroids.   Will continue to follow. Thank you. Lorenda Peck, RD, LDN, CDE Inpatient Diabetes Coordinator [phone]

## 2015-08-25 NOTE — Progress Notes (Signed)
PROGRESS NOTE    Christopher Holder [id number] DOB: Jun 23, 1930 DOA: 08/18/2015 PCP: Mackie Pai, PA-C  HPI/Brief narrative 81 y/o wm w/ complicated medical hx including CHF, prob COPD/asked mother, OSA on CPAP, DM, CAD, HTN, extremely hard of hearing, HLD & GERD. Admitted 11/29 w/ CAP and Sepsis. Was volume resuscitated, abx initiated and admitted to the SDU. Shortly after admission developed progressive respiratory distress w/ copious sputum production. Moved to the ICU for progressive respiratory failure. Suspect that this is a mix of progressive ALI vs Pulmonary edema vs mix of the both. Needed mechanical ventilation. Improved. Extubated 07/22/15. CCM transferred care to [facility] and stepdown unit on 11/4. Requested CCM to reevaluate/follow 11/5. Cardiology consulted 11/5.  Assessment/Plan:  Acute hypoxic respiratory failure - Secondary to community acquired pneumonia complicated by either evolving acute lung injury versus pulmonary edema versus mixed of both - S/p VDRF. Extubated 07/22/15. - Pulmonary toilet. Titrate oxygen for saturations 88-92 percent. - Had significant wheezing wheezing overnight 12/4. Added IV Solu-Medrol. CCM re-consulted for assistance. Flutter valve. - CCM aggressively diuresed with Lasix 40 mg IV 3 times a day & PRN necessary BiPAP added. - Chest x-ray shows bilateral basal airspace opacities-as per daughter, patient has some chronic basal findings on imaging. - CT chest without contrast 12/6: Patchy groundglass airspace opacities most compatible with multifocal pneumonia and small bilateral pleural effusions - Better than yesterday but still intermittently wheezing-? Related to anxiety ? Component of upper airway wheezing - Lasix reduced due to improvement and concern for worsening renal failure.  Probable community-acquired pneumonia - Blood culture 2: Negative, sputum culture: Negative, urine culture: Negative, urine strep antigen: Negative, urine Legionella  antigen: Negative - Antibiotics as below. Day 8 antibiotics today. However given tenuous respiratory course, continue IV Tressie Ellis for now. DC azithromycin-received 6 doses so far. - Follow CT chest: Results as above.Marland Kitchen  COPD/asthma - Continue bronchodilators and antibiotics. Management as above. Continue IV Solu-Medrol started 12/5  Severe sepsis - Secondary to community-acquired pneumonia. Improved. Sepsis physiology resolved.  Acute on chronic systolic CHF/cardiomyopathy - 2-D echo 08/21/15: LVEF 25-30 percent. Severe diffuse hypokinesis with no identifiable regional variations. Moderate AR - Complicated by volume resuscitation early on in admission for sepsis. - Appeared volume overloaded. On Bumex at home.  -Treated with aggressive IV diuresis on 12/5. Volume improved. Creatinine worsening. Lasix reduced by cardiology-agree.  - Hold ACEI secondary to acute kidney injury. - Strict intake output monitoring and daily weights. - Cardiologyinput appreciated. Discussed with cardiology - As per echo on care everywhere, EF about the same in March 2016.  Nonsustained VT - 8. Nonsustained VT noticed on 08/23/15 at 4:10 AM.  - Resumed home dose of carvedilol. Continue monitoring on telemetry. Replace potassium to >4. Magnesium 2.2. - No further NSVT's  Acute on stage III chronic kidney disease - Baseline creatinine may be in the 1.4 range. Admitted with creatinine of 1.7. Improved. - Monitor closely while diuretics reinitiated. - Creatinine has gone up from 1.29 > 1.40>1.5  in the last 48 hours after Lasix.Lasix reduced. Monitor BMP daily   Lactic acidosis - Resolved.  GERD - Continue PPI  Obesity  Anemia - Stable.  Thrombocytopenia - Transient and resolved.  Type II DM with peripheral neuropathy - SSI. Uncontrolled now secondary to steroids. Add low-dose Lantus at bedtime. - As requested by patient, resumed home dose of gabapentin. Pain control.  Gout - Resume allopurinol. No  flare at this time.  Extremely hard of hearing - Cannot hear much despite hearing  aids. Communication by writing.  Acute encephalopathy/mild delirium - In the setting of sepsis and hypoxia.  - Improved. Avoid QT prolonging medications.  Elevated troponin/CAD - Probably from demand ischemia related to sepsis and acute respiratory failure. Flat trend. 2-D echo shows decreased EF but no prior echo to compare. Low EF may be his baseline too. No chest pain reported. Consider cardiology consultation in a.m. - EKG shows sinus rhythm with frequent PVCs, LBBB and no change since prior EKG. No EKG from PTA to compare.  Hyperlipidemia - Continue statins  ? Right leg cellulitis Resolved  Prolonged QTC - Continue monitoring on telemetry. Potassium 4.6 and magnesium 2.2. ? Is it prolonged in the context of LBBB. Cardiology to opine.  - Minimize medications that might prolong QTc-DC azithromycin.     DVT prophylaxis: Lovenox Code Status: Full Family Communication: Discussed at length with patient's daughter Ms. [name] on 12/5. Disposition Plan: Remains critically ill. At risk for decline. Continue close monitoring and management in stepdown unit. Not stable for discharge.   Consultants:  CCM  Cardiology  Procedures:  Extubated 11/2  Left subclavian catheter  Antibiotics:  Rocephin 11/29>>>12/1  Ceftaz 12/2>>>  Azitho 11/29>>>  Zosyn IV 1 dose on admission   Subjective: States that his breathing is better. "I could get up and dance". Denies chest pain. As per nursing, no acute events.  Objective: Filed Vitals:   08/24/15 2335 08/25/15 0225 08/25/15 0400 08/25/15 0403  BP: 121/48   141/75  Pulse: 51   46  Temp: 97.7 F (36.5 C)   97.7 F (36.5 C)  TempSrc: Oral   Axillary  Resp: 18 18  14   Height:      Weight:      SpO2: 92%  96% 99%    Intake/Output Summary (Last 24 hours) at 08/25/15 0710 Last data filed at 08/25/15 0404  Gross per 24 hour  Intake    1556 ml  Output   1800 ml  Net   -244 ml   Filed Weights   08/23/15 0500 08/23/15 1439 08/24/15 0500  Weight: 125.2 kg (276 lb 0.3 oz) 126.4 kg (278 lb 10.6 oz) 125.6 kg (276 lb 14.4 oz)     Exam:  General exam: Pleasant elderly male lying propped up in bed, looks much improved compared to yesterday, comfortable and in no obvious distress. Respiratory system: Reduced breath sounds bilaterally with few/reduced  medium pitched expiratory rhonchi and few basal crackles. No  increased work of breathing. Cardiovascular system: S1 & S2 heard, RRR. No JVD. Trace bilateral ankle edema. SEM 2 x 6 at the apex. Tele: SB 50-SR 60's. Occ SB in 40's. No NSVT. Gastrointestinal system: Abdomen is nondistended, soft and nontender. Normal bowel sounds heard. Central nervous system: Alert and oriented. No focal neurological deficits. Extremities: Symmetric 5 x 5 power.   Data Reviewed: Basic Metabolic Panel:  Recent Labs Lab 08/19/15 1845 08/20/15 0452 08/21/15 0606 08/22/15 0555 08/23/15 0555 08/24/15 0553 08/25/15 0623  NA 141 144 145 144 140 140 143  K 4.1 4.1 4.0 3.9 3.6 4.6 4.1  CL 108 108 106 107 105 104 105  CO2 25 27 28 28 27 27 29   GLUCOSE 188* 142* 128* 152* 137* 197* 252*  BUN 23* 31* 23* 17 14 21* 40*  CREATININE 1.39* 1.41* 1.16 1.20 1.29* 1.40* 1.50*  CALCIUM 8.5* 8.5* 8.4* 8.1* 8.1* 8.2* 8.5*  MG 2.4 2.3 2.1 2.2 2.2  --   --   PHOS  --  3.0 3.4 3.2 3.0  --   --    Liver Function Tests:  Recent Labs Lab 08/18/15 0838 08/18/15 1640 08/19/15 0505  AST 19 21 22   ALT 13* 15* 13*  ALKPHOS 77 76 72  BILITOT 1.7* 1.2 0.5  PROT 5.9* 5.9* 5.6*  ALBUMIN 3.0* 2.7* 2.5*   No results for input(s): LIPASE, AMYLASE in the last 168 hours. No results for input(s): AMMONIA in the last 168 hours. CBC:  Recent Labs Lab 08/18/15 0838 08/18/15 2020  08/20/15 0452 08/21/15 0606 08/22/15 0555 08/23/15 0555 08/25/15 0623  WBC 11.9* 12.6*  < > 10.7* 7.3 9.1 8.5 8.0  NEUTROABS  10.5* 11.5*  --   --   --   --   --   --   HGB 11.5* 11.1*  < > 11.5* 10.9* 10.3* 10.1* 10.5*  HCT 36.0* 34.7*  < > 35.1* 34.4* 32.9* 32.0* 31.9*  MCV 91.6 92.5  < > 91.2 91.5 92.2 91.7 90.6  PLT 143* 145*  < > 172 169 156 171 217  < > = values in this interval not displayed. Cardiac Enzymes:  Recent Labs Lab 08/19/15 0044 08/19/15 0505 08/19/15 1405  TROPONINI 0.40* 0.31* 0.27*   BNP (last 3 results) No results for input(s): PROBNP in the last 8760 hours. CBG:  Recent Labs Lab 08/23/15 1707 08/24/15 0612 08/24/15 1234 08/24/15 1622 08/24/15 2131  GLUCAP 136* 188* 293* 369* 242*    Recent Results (from the past 240 hour(s))  Culture, blood (routine x 2)     Status: None   Collection Time: 08/18/15  8:38 AM  Result Value Ref Range Status   Specimen Description BLOOD LEFT ARM  Final   Special Requests BOTTLES DRAWN AEROBIC AND ANAEROBIC 5CC  Final   Culture NO GROWTH 5 DAYS  Final   Report Status 08/23/2015 FINAL  Final  Urine culture     Status: None   Collection Time: 08/18/15  8:38 AM  Result Value Ref Range Status   Specimen Description URINE, CATHETERIZED  Final   Special Requests NONE  Final   Culture NO GROWTH 1 DAY  Final   Report Status 08/19/2015 FINAL  Final  Culture, blood (routine x 2)     Status: None   Collection Time: 08/18/15  9:00 AM  Result Value Ref Range Status   Specimen Description BLOOD RIGHT ARM  Final   Special Requests BOTTLES DRAWN AEROBIC AND ANAEROBIC 10CC  Final   Culture NO GROWTH 5 DAYS  Final   Report Status 08/23/2015 FINAL  Final  MRSA PCR Screening     Status: None   Collection Time: 08/18/15  1:23 PM  Result Value Ref Range Status   MRSA by PCR NEGATIVE NEGATIVE Final    Comment:        The GeneXpert MRSA Assay (FDA approved for NASAL specimens only), is one component of a comprehensive MRSA colonization surveillance program. It is not intended to diagnose MRSA infection nor to guide or monitor treatment for MRSA  infections.   Culture, respiratory (NON-Expectorated)     Status: None   Collection Time: 08/18/15  5:14 PM  Result Value Ref Range Status   Specimen Description TRACHEAL ASPIRATE  Final   Special Requests NONE  Final   Gram Stain   Final    FEW WBC PRESENT,BOTH PMN AND MONONUCLEAR RARE SQUAMOUS EPITHELIAL CELLS PRESENT NO ORGANISMS SEEN Performed at [facility]    Culture   Final    NO  GROWTH 2 DAYS Performed at [facility]    Report Status 08/20/2015 FINAL  Final         Studies: Ct Chest Wo Contrast  08/25/2015  CLINICAL DATA:  81 year old male with pneumonia and respiratory distress EXAM: CT CHEST WITHOUT CONTRAST TECHNIQUE: Multidetector CT imaging of the chest was performed following the standard protocol without IV contrast. COMPARISON:  Chest radiograph dated 08/24/2015 FINDINGS: Evaluation of this exam is limited in the absence of intravenous contrast. Bilateral patchy ground-glass airspace opacity with areas of air bronchogram noted most compatible with multifocal pneumonia. The small bilateral pleural effusions with associated partial compressive atelectasis of the lower lobes. There is no pneumothorax. The central airways are patent. There is mild atherosclerotic calcification of the thoracic aorta. The pulmonary arteries are grossly unremarkable. There is cardiomegaly. No pericardial effusion. There is coronary vascular calcification. No hilar or mediastinal adenopathy. A left subclavian central line with tip in the central SVC. The visualized esophagus and thyroid gland appear grossly unremarkable. There is no axillary adenopathy. The chest wall soft tissues appear unremarkable. There is degenerative changes of the spine. No acute fracture. Cholelithiasis.  The visualized upper abdomen appear unremarkable. IMPRESSION: Patchy ground-glass airspace opacities most compatible with multi focal pneumonia. Clinical correlation and follow-up recommended. Small  bilateral pleural effusions. Cardiomegaly. Electronically Signed   By: Anner Crete M.D.   On: 08/25/2015 01:52   Dg Chest Port 1 View  08/25/2015  CLINICAL DATA:  81 year old male with acute respiratory failure. EXAM: PORTABLE CHEST 1 VIEW COMPARISON:  Chest x-ray 08/24/2015. FINDINGS: There is a left-sided subclavian central venous catheter with tip terminating in the distal superior vena cava. Patchy asymmetrically distributed airspace consolidation throughout the lungs bilaterally (right greater than left), with some sparing of the left upper lobe, similar to the prior study, most compatible with severe multilobar bronchopneumonia. Small left pleural effusion. Pulmonary vasculature is obscured. Heart size appears borderline enlarged. The patient is rotated to the left on today's exam, resulting in distortion of the mediastinal contours and reduced diagnostic sensitivity and specificity for mediastinal pathology. IMPRESSION: 1. Appearance of the chest is very similar to the prior study, and based on comparison with chest CT 08/25/2015, this most likely reflects multilobar bronchopneumonia. Electronically Signed   By: Vinnie Langton M.D.   On: 08/25/2015 07:08   Dg Chest Port 1 View  08/24/2015  CLINICAL DATA:  81 year old male with shortness of breath EXAM: PORTABLE CHEST 1 VIEW COMPARISON:  Radiograph dated 08/22/2015 FINDINGS: Single-view of the chest demonstrate bilateral patchy airspace opacity, grossly stable or minimally increased compared to the prior study. Small right pleural effusion may be present. There is no pneumothorax. Stable cardiomegaly. Left subclavian central line with tip in stable positioning. The osseous structures are grossly unremarkable. IMPRESSION: Bilateral patchy airspace opacities, grossly stable or minimally increased since the prior study. Follow-up recommended. Electronically Signed   By: Anner Crete M.D.   On: 08/24/2015 02:42        Scheduled Meds: .  allopurinol  100 mg Oral BID  . aspirin  81 mg Oral Daily  . budesonide (PULMICORT) nebulizer solution  0.5 mg Nebulization BID  . carvedilol  6.25 mg Oral BID WC  . cefTAZidime (FORTAZ)  IV  2 g Intravenous 3 times per day  . enoxaparin (LOVENOX) injection  40 mg Subcutaneous Daily  . feeding supplement (ENSURE ENLIVE)  237 mL Oral BID BM  . feeding supplement (PRO-STAT SUGAR FREE 64)  30 mL Oral TID  .  furosemide  40 mg Intravenous 3 times per day  . gabapentin  600 mg Oral TID  . insulin aspart  0-9 Units Subcutaneous TID WC  . ipratropium-albuterol  3 mL Nebulization Q4H  . methylPREDNISolone (SOLU-MEDROL) injection  60 mg Intravenous Q12H  . pantoprazole  40 mg Oral Daily  . potassium chloride  10 mEq Oral BID  . simvastatin  20 mg Oral Daily  . sodium chloride  3 mL Intravenous Q12H   Continuous Infusions:    Principal Problem:   Sepsis due to pneumonia [facility]) Active Problems:   Diabetes mellitus type 2, controlled ([place])   HTN (hypertension)   Hyperlipidemia   Diabetic neuropathy ([place])   Coronary artery disease involving native coronary artery of native heart without angina pectoris   Obesity   Obstructive sleep apnea   AKI (acute kidney injury) ([place])   Metabolic encephalopathy   Thrombocytopenia ([facility])   Acute respiratory failure with hypoxia ([facility])   Hard of hearing   Chronic sinus bradycardia   CAP (community acquired pneumonia)   Acute pulmonary edema ([facility])   Septic shock ([facility])   Acute respiratory failure with hypoxemia ([facility])   Systolic CHF, acute on chronic (HCC)   Prolonged QT interval    Time spent: 30 minutes    Kloee Ballew, MD, FACP, FHM. [facility] Pager [phone]  If 7PM-7AM, please contact night-coverage [email or web address] Password [email or web address] 08/25/2015, 7:10 AM    LOS: 7 days

## 2015-08-25 NOTE — Progress Notes (Addendum)
DAILY PROGRESS NOTE  Subjective:  No events overnight. Breathing has improved somewhat. Still with significant cough and wheeze.   Objective:  Temp:  [97.2 F (36.2 C)-97.8 F (36.6 C)] 97.2 F (36.2 C) (12/06 0831) Pulse Rate:  [46-61] 59 (12/06 0936) Resp:  [12-24] 15 (12/06 0936) BP: (121-141)/(46-75) 128/46 mmHg (12/06 0831) SpO2:  [87 %-99 %] 88 % (12/06 0936) Weight change:   Intake/Output from previous day: 12/05 0701 - 12/06 0700 In: 61 [P.O.:1536; I.V.:20] Out: 1800 [Urine:1800]  Intake/Output from this shift: Total I/O In: 360 [P.O.:360] Out: 400 [Urine:400]  Medications: Current Facility-Administered Medications  Medication Dose Route Frequency Provider Last Rate Last Dose  . albuterol (PROVENTIL) (2.5 MG/3ML) 0.083% nebulizer solution 3 mL  3 mL Inhalation Q2H PRN Dianne Dun, NP   3 mL at 08/24/15 1744  . allopurinol (ZYLOPRIM) tablet 100 mg  100 mg Oral BID Modena Jansky, MD   100 mg at 08/25/15 0942  . aspirin chewable tablet 81 mg  81 mg Oral Daily Modena Jansky, MD   81 mg at 08/25/15 2542  . budesonide (PULMICORT) nebulizer solution 0.5 mg  0.5 mg Nebulization BID Erick Colace, NP   0.5 mg at 08/25/15 0820  . carvedilol (COREG) tablet 6.25 mg  6.25 mg Oral BID WC Modena Jansky, MD   6.25 mg at 08/25/15 0850  . cefTAZidime (FORTAZ) 2 g in dextrose 5 % 50 mL IVPB  2 g Intravenous 3 times per day Rush Farmer, MD   2 g at 08/25/15 [phone]  . enoxaparin (LOVENOX) injection 40 mg  40 mg Subcutaneous Daily Samella Parr, NP   40 mg at 08/25/15 0944  . feeding supplement (ENSURE ENLIVE) (ENSURE ENLIVE) liquid 237 mL  237 mL Oral BID BM Asencion Islam, RD   237 mL at 08/25/15 0943  . feeding supplement (PRO-STAT SUGAR FREE 64) liquid 30 mL  30 mL Oral TID Modena Jansky, MD   30 mL at 08/25/15 0943  . furosemide (LASIX) injection 40 mg  40 mg Intravenous 3 times per day Brand Males, MD   40 mg at 08/25/15 3762  . gabapentin  (NEURONTIN) tablet 600 mg  600 mg Oral TID Modena Jansky, MD   600 mg at 08/25/15 0943  . insulin aspart (novoLOG) injection 0-9 Units  0-9 Units Subcutaneous TID WC Modena Jansky, MD   3 Units at 08/25/15 0850  . ipratropium-albuterol (DUONEB) 0.5-2.5 (3) MG/3ML nebulizer solution 3 mL  3 mL Nebulization Q4H Brand Males, MD   3 mL at 08/25/15 0821  . methylPREDNISolone sodium succinate (SOLU-MEDROL) 125 mg/2 mL injection 60 mg  60 mg Intravenous Q12H Modena Jansky, MD   60 mg at 08/25/15 0849  . pantoprazole (PROTONIX) EC tablet 40 mg  40 mg Oral Daily Jake Church Masters, RPH   40 mg at 08/25/15 8315  . potassium chloride (K-DUR,KLOR-CON) CR tablet 10 mEq  10 mEq Oral BID Modena Jansky, MD   10 mEq at 08/25/15 0943  . simvastatin (ZOCOR) tablet 20 mg  20 mg Oral Daily Modena Jansky, MD   20 mg at 08/25/15 0943  . sodium chloride 0.9 % injection 3 mL  3 mL Intravenous Q12H Samella Parr, NP   2 mL at 08/25/15 0944    Physical Exam: General appearance: alert and no distress Neck: no carotid bruit and no JVD Lungs: diminished breath sounds bilaterally, rales bilaterally and wheezes  bilaterally Heart: regular rate and rhythm, S1, S2 normal, no murmur, click, rub or gallop Abdomen: soft, non-tender; bowel sounds normal; no masses,  no organomegaly Extremities: extremities normal, atraumatic, no cyanosis or edema Pulses: 2+ and symmetric Skin: Skin color, texture, turgor normal. No rashes or lesions Neurologic: Grossly normal PSych: Pleasant  Lab Results: Results for orders placed or performed during the hospital encounter of 08/18/15 (from the past 48 hour(s))  Glucose, capillary     Status: Abnormal   Collection Time: 08/23/15 12:06 PM  Result Value Ref Range   Glucose-Capillary 143 (H) 65 - 99 mg/dL  Glucose, capillary     Status: Abnormal   Collection Time: 08/23/15  5:07 PM  Result Value Ref Range   Glucose-Capillary 136 (H) 65 - 99 mg/dL  Basic metabolic panel      Status: Abnormal   Collection Time: 08/24/15  5:53 AM  Result Value Ref Range   Sodium 140 135 - 145 mmol/L   Potassium 4.6 3.5 - 5.1 mmol/L    Comment: DELTA CHECK NOTED   Chloride 104 101 - 111 mmol/L   CO2 27 22 - 32 mmol/L   Glucose, Bld 197 (H) 65 - 99 mg/dL   BUN 21 (H) 6 - 20 mg/dL   Creatinine, Ser 1.40 (H) 0.61 - 1.24 mg/dL   Calcium 8.2 (L) 8.9 - 10.3 mg/dL   GFR calc non Af Amer 45 (L) >60 mL/min   GFR calc Af Amer 52 (L) >60 mL/min    Comment: (NOTE) The eGFR has been calculated using the CKD EPI equation. This calculation has not been validated in all clinical situations. eGFR's persistently <60 mL/min signify possible Chronic Kidney Disease.    Anion gap 9 5 - 15  Glucose, capillary     Status: Abnormal   Collection Time: 08/24/15  6:12 AM  Result Value Ref Range   Glucose-Capillary 188 (H) 65 - 99 mg/dL  Glucose, capillary     Status: Abnormal   Collection Time: 08/24/15  9:34 AM  Result Value Ref Range   Glucose-Capillary 239 (H) 65 - 99 mg/dL  Glucose, capillary     Status: Abnormal   Collection Time: 08/24/15 12:34 PM  Result Value Ref Range   Glucose-Capillary 293 (H) 65 - 99 mg/dL  Glucose, capillary     Status: Abnormal   Collection Time: 08/24/15  4:22 PM  Result Value Ref Range   Glucose-Capillary 369 (H) 65 - 99 mg/dL  Glucose, capillary     Status: Abnormal   Collection Time: 08/24/15  9:31 PM  Result Value Ref Range   Glucose-Capillary 242 (H) 65 - 99 mg/dL  Basic metabolic panel     Status: Abnormal   Collection Time: 08/25/15  6:23 AM  Result Value Ref Range   Sodium 143 135 - 145 mmol/L   Potassium 4.1 3.5 - 5.1 mmol/L   Chloride 105 101 - 111 mmol/L   CO2 29 22 - 32 mmol/L   Glucose, Bld 252 (H) 65 - 99 mg/dL   BUN 40 (H) 6 - 20 mg/dL   Creatinine, Ser 1.50 (H) 0.61 - 1.24 mg/dL   Calcium 8.5 (L) 8.9 - 10.3 mg/dL   GFR calc non Af Amer 41 (L) >60 mL/min   GFR calc Af Amer 48 (L) >60 mL/min    Comment: (NOTE) The eGFR has been  calculated using the CKD EPI equation. This calculation has not been validated in all clinical situations. eGFR's persistently <60 mL/min signify possible Chronic  Kidney Disease.    Anion gap 9 5 - 15  CBC     Status: Abnormal   Collection Time: 08/25/15  6:23 AM  Result Value Ref Range   WBC 8.0 4.0 - 10.5 K/uL   RBC 3.52 (L) 4.22 - 5.81 MIL/uL   Hemoglobin 10.5 (L) 13.0 - 17.0 g/dL   HCT 31.9 (L) 39.0 - 52.0 %   MCV 90.6 78.0 - 100.0 fL   MCH 29.8 26.0 - 34.0 pg   MCHC 32.9 30.0 - 36.0 g/dL   RDW 15.9 (H) 11.5 - 15.5 %   Platelets 217 150 - 400 K/uL  Glucose, capillary     Status: Abnormal   Collection Time: 08/25/15  8:30 AM  Result Value Ref Range   Glucose-Capillary 201 (H) 65 - 99 mg/dL    Imaging: Ct Chest Wo Contrast  08/25/2015  CLINICAL DATA:  81 year old male with pneumonia and respiratory distress EXAM: CT CHEST WITHOUT CONTRAST TECHNIQUE: Multidetector CT imaging of the chest was performed following the standard protocol without IV contrast. COMPARISON:  Chest radiograph dated 08/24/2015 FINDINGS: Evaluation of this exam is limited in the absence of intravenous contrast. Bilateral patchy ground-glass airspace opacity with areas of air bronchogram noted most compatible with multifocal pneumonia. The small bilateral pleural effusions with associated partial compressive atelectasis of the lower lobes. There is no pneumothorax. The central airways are patent. There is mild atherosclerotic calcification of the thoracic aorta. The pulmonary arteries are grossly unremarkable. There is cardiomegaly. No pericardial effusion. There is coronary vascular calcification. No hilar or mediastinal adenopathy. A left subclavian central line with tip in the central SVC. The visualized esophagus and thyroid gland appear grossly unremarkable. There is no axillary adenopathy. The chest wall soft tissues appear unremarkable. There is degenerative changes of the spine. No acute fracture.  Cholelithiasis.  The visualized upper abdomen appear unremarkable. IMPRESSION: Patchy ground-glass airspace opacities most compatible with multi focal pneumonia. Clinical correlation and follow-up recommended. Small bilateral pleural effusions. Cardiomegaly. Electronically Signed   By: Anner Crete M.D.   On: 08/25/2015 01:52   Dg Chest Port 1 View  08/25/2015  CLINICAL DATA:  81 year old male with acute respiratory failure. EXAM: PORTABLE CHEST 1 VIEW COMPARISON:  Chest x-ray 08/24/2015. FINDINGS: There is a left-sided subclavian central venous catheter with tip terminating in the distal superior vena cava. Patchy asymmetrically distributed airspace consolidation throughout the lungs bilaterally (right greater than left), with some sparing of the left upper lobe, similar to the prior study, most compatible with severe multilobar bronchopneumonia. Small left pleural effusion. Pulmonary vasculature is obscured. Heart size appears borderline enlarged. The patient is rotated to the left on today's exam, resulting in distortion of the mediastinal contours and reduced diagnostic sensitivity and specificity for mediastinal pathology. IMPRESSION: 1. Appearance of the chest is very similar to the prior study, and based on comparison with chest CT 08/25/2015, this most likely reflects multilobar bronchopneumonia. Electronically Signed   By: Vinnie Langton M.D.   On: 08/25/2015 07:08   Dg Chest Port 1 View  08/24/2015  CLINICAL DATA:  81 year old male with shortness of breath EXAM: PORTABLE CHEST 1 VIEW COMPARISON:  Radiograph dated 08/22/2015 FINDINGS: Single-view of the chest demonstrate bilateral patchy airspace opacity, grossly stable or minimally increased compared to the prior study. Small right pleural effusion may be present. There is no pneumothorax. Stable cardiomegaly. Left subclavian central line with tip in stable positioning. The osseous structures are grossly unremarkable. IMPRESSION: Bilateral  patchy airspace opacities, grossly stable  or minimally increased since the prior study. Follow-up recommended. Electronically Signed   By: Anner Crete M.D.   On: 08/24/2015 02:42    Assessment:  1. Principal Problem: 2.   Sepsis due to pneumonia ([place]) 3. Active Problems: 4.   Diabetes mellitus type 2, controlled ([place]) 5.   HTN (hypertension) 6.   Hyperlipidemia 7.   Diabetic neuropathy ([place]) 8.   Coronary artery disease involving native coronary artery of native heart without angina pectoris 9.   Obesity 10.   Obstructive sleep apnea 11.   AKI (acute kidney injury) ([place]) 12.   Metabolic encephalopathy 13.   Thrombocytopenia ([place]) 14.   Acute respiratory failure with hypoxia (HCC) 15.   Hard of hearing 16.   Chronic sinus bradycardia 17.   CAP (community acquired pneumonia) 22.   Acute pulmonary edema (HCC) 19.   Septic shock ([place]) 20.   Acute respiratory failure with hypoxemia (HCC) 21.   Systolic CHF, acute on chronic (HCC) 22.   Prolonged QT interval 23.   Plan:  1. CT yesterday shows patchy multilobar pneumonia - trivial pleural effusions. On lasix for this. Only -400 ML recorded negative yesterday. Creatinine is rising - now up to 1.5. BUN in now 40 - appears to be contracting. Will have to decrease lasix frequency - suspect his may be close to euvolemic. Hold lasix today and restart daily tomorrow. Check BNP in am. Considering stress testing, maybe Thursday, depending on how he progresses with pneumonia. With rising creatinine, hesitant to cath him at this point - especially, as he is not having angina.  Time Spent Directly with Patient:  15 minutes  Length of Stay:  LOS: 7 days   Pixie Casino, MD, [facility] Attending Cardiologist [place] 08/25/2015, 10:26 AM

## 2015-08-25 NOTE — Progress Notes (Signed)
PULMONARY / CRITICAL CARE MEDICINE   Name: Christopher Holder MRN: [id number] DOB: [birth year]    ADMISSION DATE:  08/18/2015 CONSULTATION DATE:  11/29 REFERRING MD :  Tyrell Antonio   CHIEF COMPLAINT:  Acute respiratory failure  BRIEF 81 year old gentleman recently moved here from [place], has a past medical history of CAD followed by Dr. Marlou Porch and history of asthma versus COPD currently being worked up by Dr. Lamonte Sakai  Also has h/o diabetes on oral medications, sleep apnea on Nocturnal CPAP, hypertension, diabetic neuropathy, obesity, remote CAD and dyslipidemia. Presents to the ER on 11/29 after 2 separate falls that morning. Upon EMS arrival patient was febrile with a temperature of 101.1 orally and relative hypotension with a BP of 97/37. Patient was given a gram of Tylenol and transported to the hospital. According to the patient's family he had been having diarrhea that morning and had strong smelling urine. In the ER he was given normal saline fluids and was somewhat disoriented and noted to be significantly hard of hearing. He was also found to have a small abrasion on his left knee. In the ER patient's rectal temperature was 102.4, respirations were 25, pulse was 60 and he was in sinus rhythm, BP was 92/44, O2 sats were 88% on room air so oxygen was applied and saturations of subsequently increased to 97%. He  received 3 L of fluid in the ER. He was admitted to the SDU w/ working dx of CAP and sepsis. Not long after arrival to the SDU he was found more confused and hypoxic w/ O2 sats in low 80s on 4 liters. PCCM was asked to eval given concern for impending respiratory arrest 08/18/15  SIGNIFICANT EVENTS: 11/29 intubated. ECHO ef 25% 12/1 extubated 12/2 confused.  - Awake and interactive, very hard of hearing. PCCM signed off. Moved to SDU  08/24/15 - PCCM asked to re-evalaute because of ongoing wheezing despite nebs and lasix. Cards has been called. RN at bedside reports mixture of extreme difficulty  hearing (aidds not working) and some mild hypoactive delirium. They are not able to get his hearing aid working. No fever or rising wbc - on chart freviewd. On Abx. CXR with bilateral diffused  Airspace dz - similar to yesterday but improved from admit    SUBJECTIVE/OVERNIGHT/INTERVAL HX 12/616 -cards has cut back on diuresis due to volume contraction on exam and labs. Still wheezing per cards but to me wheeze improved CT with multifocal pneumonia and small bilateral effusion - personally reviewed. On 4L [place] - 98%  VITAL SIGNS: BP 127/48 mmHg  Pulse 57  Temp(Src) 97.2 F (36.2 C) (Oral)  Resp 19  Ht 6\' 1"  (1.854 m)  Wt 125.6 kg (276 lb 14.4 oz)  BMI 36.54 kg/m2  SpO2 98%  HEMODYNAMICS:    VENTILATOR SETTINGS:  4L [place]  INTAKE / OUTPUT: I/O last 3 completed shifts: In: 1676 [P.O.:1536; I.V.:140] Out: 2300 [Urine:2300]  PHYSICAL EXAMINATION: General:  81 year old male, awake and interactive, moving all ext to command.  Looks deconditioned but improved since 08/24/15 Neuro:  Awake, EXTREME HOH + but despite that hypoactive delirium + - likely resolved/improvedd 08/25/15 HEENT:  [place]/AT, no JVD, PERRL, EOM-I and MMM. Audible Wheeze resolved. Only faint wheeze + Cardiovascular:  rrr w/out MRG. Lungs:  Wheezing + but very faint and significantly better no increased wob Abdomen:  Obese + bowel sounds. Musculoskeletal:  -edema and -tenderness.  R leg warm to the touch. Skin:  Warm and dry.  LABS:  PULMONARY  Recent  Labs Lab 08/18/15 1450 08/18/15 1656 08/19/15 0340 08/20/15 0440  PHART 7.300* 7.322* 7.453* 7.482*  PCO2ART 48.6* 46.4* 32.3* 31.3*  PO2ART 50.0* 281* 152* 94.1  HCO3 23.2 22.7 22.2 23.2  TCO2 24.7 24.0 23.2 24.1  O2SAT 81.0 99.3 99.0 97.5    CBC  Recent Labs Lab 08/22/15 0555 08/23/15 0555 08/25/15 0623  HGB 10.3* 10.1* 10.5*  HCT 32.9* 32.0* 31.9*  WBC 9.1 8.5 8.0  PLT 156 171 217    COAGULATION  Recent Labs Lab 08/18/15 1720  INR 1.64*     CARDIAC    Recent Labs Lab 08/19/15 0044 08/19/15 0505 08/19/15 1405  TROPONINI 0.40* 0.31* 0.27*   No results for input(s): PROBNP in the last 168 hours.   CHEMISTRY  Recent Labs Lab 08/19/15 1845 08/20/15 0452 08/21/15 0606 08/22/15 0555 08/23/15 0555 08/24/15 0553 08/25/15 0623  NA 141 144 145 144 140 140 143  K 4.1 4.1 4.0 3.9 3.6 4.6 4.1  CL 108 108 106 107 105 104 105  CO2 25 27 28 28 27 27 29   GLUCOSE 188* 142* 128* 152* 137* 197* 252*  BUN 23* 31* 23* 17 14 21* 40*  CREATININE 1.39* 1.41* 1.16 1.20 1.29* 1.40* 1.50*  CALCIUM 8.5* 8.5* 8.4* 8.1* 8.1* 8.2* 8.5*  MG 2.4 2.3 2.1 2.2 2.2  --   --   PHOS  --  3.0 3.4 3.2 3.0  --   --    Estimated Creatinine Clearance: 51.8 mL/min (by C-G formula based on Cr of 1.5).   LIVER  Recent Labs Lab 08/18/15 1640 08/18/15 1720 08/19/15 0505  AST 21  --  22  ALT 15*  --  13*  ALKPHOS 76  --  72  BILITOT 1.2  --  0.5  PROT 5.9*  --  5.6*  ALBUMIN 2.7*  --  2.5*  INR  --  1.64*  --      INFECTIOUS  Recent Labs Lab 08/18/15 1640 08/18/15 1647 08/19/15 0505 08/20/15 0452  LATICACIDVEN  --  1.4  --   --   PROCALCITON 1.81  --  2.74 2.61     ENDOCRINE CBG (last 3)   Recent Labs  08/24/15 2131 08/25/15 0830 08/25/15 1239  GLUCAP 242* 201* 276*         IMAGING x48h  - image(s) personally visualized  -   highlighted in bold Ct Chest Wo Contrast  08/25/2015  CLINICAL DATA:  81 year old male with pneumonia and respiratory distress EXAM: CT CHEST WITHOUT CONTRAST TECHNIQUE: Multidetector CT imaging of the chest was performed following the standard protocol without IV contrast. COMPARISON:  Chest radiograph dated 08/24/2015 FINDINGS: Evaluation of this exam is limited in the absence of intravenous contrast. Bilateral patchy ground-glass airspace opacity with areas of air bronchogram noted most compatible with multifocal pneumonia. The small bilateral pleural effusions with associated partial  compressive atelectasis of the lower lobes. There is no pneumothorax. The central airways are patent. There is mild atherosclerotic calcification of the thoracic aorta. The pulmonary arteries are grossly unremarkable. There is cardiomegaly. No pericardial effusion. There is coronary vascular calcification. No hilar or mediastinal adenopathy. A left subclavian central line with tip in the central SVC. The visualized esophagus and thyroid gland appear grossly unremarkable. There is no axillary adenopathy. The chest wall soft tissues appear unremarkable. There is degenerative changes of the spine. No acute fracture. Cholelithiasis.  The visualized upper abdomen appear unremarkable. IMPRESSION: Patchy ground-glass airspace opacities most compatible with multi focal pneumonia. Clinical  correlation and follow-up recommended. Small bilateral pleural effusions. Cardiomegaly. Electronically Signed   By: Anner Crete M.D.   On: 08/25/2015 01:52   Dg Chest Port 1 View  08/25/2015  CLINICAL DATA:  81 year old male with acute respiratory failure. EXAM: PORTABLE CHEST 1 VIEW COMPARISON:  Chest x-ray 08/24/2015. FINDINGS: There is a left-sided subclavian central venous catheter with tip terminating in the distal superior vena cava. Patchy asymmetrically distributed airspace consolidation throughout the lungs bilaterally (right greater than left), with some sparing of the left upper lobe, similar to the prior study, most compatible with severe multilobar bronchopneumonia. Small left pleural effusion. Pulmonary vasculature is obscured. Heart size appears borderline enlarged. The patient is rotated to the left on today's exam, resulting in distortion of the mediastinal contours and reduced diagnostic sensitivity and specificity for mediastinal pathology. IMPRESSION: 1. Appearance of the chest is very similar to the prior study, and based on comparison with chest CT 08/25/2015, this most likely reflects multilobar  bronchopneumonia. Electronically Signed   By: Vinnie Langton M.D.   On: 08/25/2015 07:08   Dg Chest Port 1 View  08/24/2015  CLINICAL DATA:  81 year old male with shortness of breath EXAM: PORTABLE CHEST 1 VIEW COMPARISON:  Radiograph dated 08/22/2015 FINDINGS: Single-view of the chest demonstrate bilateral patchy airspace opacity, grossly stable or minimally increased compared to the prior study. Small right pleural effusion may be present. There is no pneumothorax. Stable cardiomegaly. Left subclavian central line with tip in stable positioning. The osseous structures are grossly unremarkable. IMPRESSION: Bilateral patchy airspace opacities, grossly stable or minimally increased since the prior study. Follow-up recommended. Electronically Signed   By: Anner Crete M.D.   On: 08/24/2015 02:42       DISCUSSION: 43 yowm w/ complicated medical hx including CHF, prob COPD and OSA. Admitted 11/28 w/ CAP and Sepsis. Intubated1229, Extubated 08/20/15 and move to SDU 08/21/15. PCCM reconsutled 08/24/15 for wheezing + persistent bilateral air space dz. Has ongoing deliruim. Has adquate ID rx and no evidence of active infection REcommend diuresis + BiPAP + CT chest  ASSESSMENT / PLAN:  PULMONARY A: Acute hypoxic respiratory failure in setting of CAP c/b either evolving ALI vs pulmonary edema vs mix of both   - improved but not fully resolved. Has ongoing wheeze despite extubatino 08/20/15 and cxr findings but has further improvement 08/25/15 following BiPAP and lasix on 08/24/15.    P:   BiPAP to give positiv pressure esp QHS one more day Continue solumedrol O2 for pulse ox > 88% LASIX - 40mg  daily - backed off by cards  Nebs Q4h   CARDIOVASCULAR A:  Has hx of ch ronic systolic chf Might have acute on chronic systolic chf Long QTc on ekg 08/23/15 P:  salien lock Tele monitoring. AGGRESSIVE lasix Await cards consult  RENAL A:   AKI  Due to pneumnia at ad mit  Worse again 08/25/2015  with lasix  P:   Renal dose meds. Strict I&O. F/u chemistry in am. Replace electrolytes as indicated. Monitor with cut down in lasix  GASTROINTESTINAL A:   Obesity  H/o GERD P:   PPI. Heart healthy diet.  HEMATOLOGIC A:   Anemia  Mild thrombocytopenia  P:  LMWH Trend cbc Transfuse per typical protocol   INFECTIOUS   CULTURES: Sputum 11/29>>>NTD BCX2 11/29>>>NTD U strep 11/29>>>Neg U legionella 11/29>>Neg Influenza panel 11/29: neg   A:   Probable CAP Sepsis  Concern for right leg cellulitis P:   SGTOP at total 8 day abx.  Today 08/25/2015 LOS is 7 days Anti-infectives    Start     Dose/Rate Route Frequency Ordered Stop   08/21/15 1400  cefTAZidime (FORTAZ) 2 g in dextrose 5 % 50 mL IVPB     2 g 100 mL/hr over 30 Minutes Intravenous 3 times per day 08/21/15 1255     08/21/15 1230  azithromycin (ZITHROMAX) 500 mg in dextrose 5 % 250 mL IVPB  Status:  Discontinued     500 mg 250 mL/hr over 60 Minutes Intravenous Every 24 hours 08/21/15 1217 08/24/15 1146   08/21/15 1200  azithromycin (ZITHROMAX) tablet 500 mg  Status:  Discontinued     500 mg Oral Daily 08/21/15 1026 08/21/15 1217   08/18/15 1200  azithromycin (ZITHROMAX) 500 mg in dextrose 5 % 250 mL IVPB  Status:  Discontinued     500 mg 250 mL/hr over 60 Minutes Intravenous Every 24 hours 08/18/15 1131 08/21/15 1026   08/18/15 1200  cefTRIAXone (ROCEPHIN) 1 g in dextrose 5 % 50 mL IVPB  Status:  Discontinued     1 g 100 mL/hr over 30 Minutes Intravenous Every 24 hours 08/18/15 1131 08/21/15 1255   08/18/15 1045  vancomycin (VANCOCIN) 2,000 mg in sodium chloride 0.9 % 500 mL IVPB  Status:  Discontinued     2,000 mg 250 mL/hr over 120 Minutes Intravenous  Once 08/18/15 1031 08/18/15 1130   08/18/15 1045  piperacillin-tazobactam (ZOSYN) IVPB 3.375 g     3.375 g 100 mL/hr over 30 Minutes Intravenous  Once 08/18/15 1031 08/18/15 1113     ENDOCRINE A:   DM w/ hyperglycemia  P:   SSI protocol    NEUROLOGIC A:   Acute Encephalopathy in setting of sepsis and hypoxia. HYPOACTIVE delirium - mild 08/24/15. Improved 08/25/15 with his hearing aid  P:   D/C all sedation. Treat sepsis. Await QTC correction 0- then try haldol  FAMILY  - Updates: none at bedside 08/25/2015   - Inter-disciplinary family meet or Palliative Care meeting due by:  12/6   GLOBAL AIm to move to floor /LTAC . Slow medical recovery anticipated. Needs rehab    Dr. Brand Males, M.D., [facility].C.P Pulmonary and Critical Care Medicine Staff Physician [place] [facility] Pager: [phone], If no answer or between  15:00h - 7:00h: call [phone]  08/25/2015 1:34 PM

## 2015-08-25 NOTE — Progress Notes (Signed)
Reapplied CPAP to patient upon return to room from procedure.

## 2015-08-25 NOTE — Care Management Note (Signed)
Case Management Note  Patient Details  Name: Christopher Holder MRN: [id number] Date of Birth: [birth year]  Subjective/Objective:    Pt now agreeing with physical therapist that he needs some rehab before returning home, wants SNF social worker to inform him when insurance will not pay for continued stay in SNF.  Reports that he paid over $600.00 for a previous rehab stay without being informed that insurance was not covering entire cost.  Pt states he needs to be stronger before returning home because his granddaughter that lives with him works at night and sleeps during the day.                            Expected Discharge Plan:  Skilled Nursing Facility  In-House Referral:  Clinical Social Work  Discharge planning Services  CM Consult  Status of Service:  In process, will continue to follow  Medicare Important Message Given:  Yes   Girard Cooter, RN 08/25/2015, 1:55 PM

## 2015-08-26 ENCOUNTER — Ambulatory Visit: Payer: Medicare Other | Admitting: Medical

## 2015-08-26 DIAGNOSIS — A419 Sepsis, unspecified organism: Secondary | ICD-10-CM | POA: Insufficient documentation

## 2015-08-26 DIAGNOSIS — I480 Paroxysmal atrial fibrillation: Secondary | ICD-10-CM

## 2015-08-26 DIAGNOSIS — R6520 Severe sepsis without septic shock: Secondary | ICD-10-CM | POA: Insufficient documentation

## 2015-08-26 DIAGNOSIS — I4819 Other persistent atrial fibrillation: Secondary | ICD-10-CM | POA: Diagnosis not present

## 2015-08-26 DIAGNOSIS — I5021 Acute systolic (congestive) heart failure: Secondary | ICD-10-CM

## 2015-08-26 LAB — BASIC METABOLIC PANEL
Anion gap: 8 (ref 5–15)
BUN: 47 mg/dL — AB (ref 6–20)
CHLORIDE: 102 mmol/L (ref 101–111)
CO2: 31 mmol/L (ref 22–32)
Calcium: 8.4 mg/dL — ABNORMAL LOW (ref 8.9–10.3)
Creatinine, Ser: 1.33 mg/dL — ABNORMAL HIGH (ref 0.61–1.24)
GFR calc Af Amer: 55 mL/min — ABNORMAL LOW (ref >60)
GFR calc non Af Amer: 48 mL/min — ABNORMAL LOW (ref >60)
GLUCOSE: 223 mg/dL — AB (ref 65–99)
POTASSIUM: 4.3 mmol/L (ref 3.5–5.1)
Sodium: 141 mmol/L (ref 135–145)

## 2015-08-26 LAB — GLUCOSE, CAPILLARY
GLUCOSE-CAPILLARY: 191 mg/dL — AB (ref 65–99)
GLUCOSE-CAPILLARY: 315 mg/dL — AB (ref 65–99)
Glucose-Capillary: 305 mg/dL — ABNORMAL HIGH (ref 65–99)
Glucose-Capillary: 248 mg/dL — ABNORMAL HIGH (ref 65–99)

## 2015-08-26 LAB — HEPARIN LEVEL (UNFRACTIONATED): Heparin Unfractionated: 0.26 IU/mL — ABNORMAL LOW (ref 0.30–0.70)

## 2015-08-26 LAB — BRAIN NATRIURETIC PEPTIDE: B Natriuretic Peptide: 1054.2 pg/mL — ABNORMAL HIGH (ref 0.0–100.0)

## 2015-08-26 MED ORDER — HEPARIN (PORCINE) IN NACL 100-0.45 UNIT/ML-% IJ SOLN
1500.0000 [IU]/h | INTRAMUSCULAR | Status: DC
  Administered 2015-08-26: 1500 [IU]/h via INTRAVENOUS
  Filled 2015-08-26: qty 250

## 2015-08-26 MED ORDER — PREDNISONE 20 MG PO TABS
40.0000 mg | ORAL_TABLET | Freq: Two times a day (BID) | ORAL | Status: DC
  Administered 2015-08-26: 40 mg via ORAL
  Filled 2015-08-26: qty 2

## 2015-08-26 MED ORDER — INSULIN GLARGINE 100 UNIT/ML ~~LOC~~ SOLN
15.0000 [IU] | Freq: Every day | SUBCUTANEOUS | Status: DC
  Administered 2015-08-26: 15 [IU] via SUBCUTANEOUS
  Filled 2015-08-26 (×2): qty 0.15

## 2015-08-26 MED ORDER — FUROSEMIDE 10 MG/ML IJ SOLN
40.0000 mg | Freq: Two times a day (BID) | INTRAMUSCULAR | Status: DC
  Administered 2015-08-26 – 2015-08-29 (×7): 40 mg via INTRAVENOUS
  Filled 2015-08-26 (×7): qty 4

## 2015-08-26 MED ORDER — HEPARIN (PORCINE) IN NACL 100-0.45 UNIT/ML-% IJ SOLN
1700.0000 [IU]/h | INTRAMUSCULAR | Status: DC
  Administered 2015-08-26 – 2015-08-28 (×3): 1700 [IU]/h via INTRAVENOUS
  Filled 2015-08-26 (×3): qty 250

## 2015-08-26 MED ORDER — HEPARIN BOLUS VIA INFUSION
4000.0000 [IU] | Freq: Once | INTRAVENOUS | Status: AC
  Administered 2015-08-26: 4000 [IU] via INTRAVENOUS
  Filled 2015-08-26: qty 4000

## 2015-08-26 NOTE — Progress Notes (Signed)
Physical Therapy Treatment Patient Details Name: Christopher Holder MRN: [id number] DOB: [birth year] Today's Date: 08/26/2015    History of Present Illness pt presents with PNA and Sepsis.  pt with hx of CAD, COPD, HTN, DM, Neuropathies, and Hearing Loss.    PT Comments    Pt admitted with above diagnosis. Pt currently with functional limitations due to balance and endurance deficits. Pt progressing with ability to ambulate further without sitting rest break.  VSS with sats >90% on 4LO2 with ambulation as well.  Pt will benefit from skilled PT to increase their independence and safety with mobility to allow discharge to the venue listed below.    Follow Up Recommendations  SNF     Equipment Recommendations  None recommended by PT    Recommendations for Other Services       Precautions / Restrictions Precautions Precautions: Fall Precaution Comments: pt very HOH even with Bil Hearing Aides.   Restrictions Weight Bearing Restrictions: No    Mobility  Bed Mobility Overal bed mobility: Needs Assistance Bed Mobility: Supine to Sit     Supine to sit: Min assist (with VCs for sequencing)     General bed mobility comments: Pt able to roll to his left side and then bring LEs off of the bed with min assist.  Needed min assist for transitioning to sitting from [facility] elevated to 30 degrees.  Transfers Overall transfer level: Needs assistance Equipment used: Rolling walker (2 wheeled) Transfers: Sit to/from Stand Sit to Stand: Min assist;From elevated surface;+2 safety/equipment         General transfer comment: Needed assist to power up but did better with each attempt.  NEeds cues for hand placement.   Ambulation/Gait Ambulation/Gait assistance: Min guard;+2 safety/equipment Ambulation Distance (Feet): 200 Feet Assistive device: Rolling walker (2 wheeled) Gait Pattern/deviations: Step-through pattern;Decreased stride length;Wide base of support   Gait velocity interpretation:  Below normal speed for age/gender General Gait Details: Followed pt with chair however pt did not need any seated rest breaks.  Cues to sequence steps and RW and occasional assist steering RW with turns.     Stairs            Wheelchair Mobility    Modified Rankin (Stroke Patients Only)       Balance Overall balance assessment: Needs assistance Sitting-balance support: No upper extremity supported;Feet supported Sitting balance-Leahy Scale: Fair Sitting balance - Comments: Pt able to maintain static sitting balance EOB with min guard assist.   Standing balance support: Bilateral upper extremity supported;During functional activity Standing balance-Leahy Scale: Poor Standing balance comment: relies on RW                    Cognition Arousal/Alertness: Awake/alert Behavior During Therapy: WFL for tasks assessed/performed Overall Cognitive Status: Within Functional Limits for tasks assessed                 General Comments: His HOH makes him at times seem that his cognition is off (but it is really his [facility])    Exercises General Exercises - Lower Extremity Ankle Circles/Pumps: AROM;Both;10 reps;Seated Long Arc Quad: AROM;Both;10 reps;Seated Hip Flexion/Marching: AROM;Both;10 reps;Seated    General Comments        Pertinent Vitals/Pain Pain Assessment: No/denies pain  103-123 bpm, 90-96% on 4LO2, 146/94    Home Living                      Prior Function  PT Goals (current goals can now be found in the care plan section) Progress towards PT goals: Progressing toward goals    Frequency  Min 2X/week    PT Plan Current plan remains appropriate    Co-evaluation             End of Session Equipment Utilized During Treatment: Gait belt;Oxygen Activity Tolerance: Patient limited by fatigue Patient left: in chair;with call bell/phone within reach;with chair alarm set;with family/visitor present     Time: 1020-1035 PT  Time Calculation (min) (ACUTE ONLY): 15 min  Charges:  $Gait Training: 8-22 mins                    G Codes:      WhiteGodfrey Pick 2015-09-02, 1:46 PM [employer] [facility] [phone] (pager)

## 2015-08-26 NOTE — Progress Notes (Signed)
CSW provided pt with bed offers- no choice at this time but pt will discuss with family  CSW will continue to follow  Domenica Reamer, [place] Social Worker [phone]

## 2015-08-26 NOTE — Progress Notes (Signed)
ANTICOAGULATION CONSULT NOTE - Initial Consult  Pharmacy Consult for Heparin Indication: New-onset Afib  Allergies  Allergen Reactions  . Percocet [Oxycodone-Acetaminophen] Diarrhea  . Sulfa Antibiotics Diarrhea and Nausea And Vomiting    Patient Measurements: Height: 6\' 1"  (185.4 cm) Weight: 276 lb 14.4 oz (125.6 kg) IBW/kg (Calculated) : 79.9 Heparin Dosing Weight: 108 kg  Vital Signs: Temp: 97.6 F (36.4 C) (12/07 0818) Temp Source: Oral (12/07 0818) BP: 109/79 mmHg (12/07 0818) Pulse Rate: 74 (12/07 0818)  Labs:  Recent Labs  08/24/15 0553 08/25/15 0623 08/26/15 0510  HGB  --  10.5*  --   HCT  --  31.9*  --   PLT  --  217  --   CREATININE 1.40* 1.50* 1.33*    Estimated Creatinine Clearance: 58.5 mL/min (by C-G formula based on Cr of 1.33).   Medical History: Past Medical History  Diagnosis Date  . Diabetes mellitus without complication ([place])   . CHF (congestive heart failure) ([place])   . Coronary artery disease   . COPD (chronic obstructive pulmonary disease) ([place])   . Asthma   . Hypertension   . HOH (hard of hearing)   . Sleep apnea   . Myocardial infarction ([place])   . Hyperlipidemia 05/27/2015  . GERD (gastroesophageal reflux disease) 05/27/2015  . AKI (acute kidney injury) ([place]) 08/18/2015    Assessment: 5 YOM with new-onset Afib overnight. CHADS2VASC score~5. Pharmacy was consulted to start heparin for anticoagulation. Cards considering cardioversion on 12/8 if he does not spontaneously convert. Holding off on po anticoagulation for now due to possible need for a cath during this hospitalization.   The patient has been on lovenox 40 mg daily for VTE prophylaxis - the last dose was given on 12/6 @ 0.944. Since ~24 hours, will continue with heparin bolus on initiation.  Goal of Therapy:  Heparin level 0.3-0.7 units/ml Monitor platelets by anticoagulation protocol: Yes   Plan:  1. Heparin bolus of 4000 units x 1 2. Initiate heparin at 1500 units/hr  (15 ml/hr) 3. Daily heparin levels, CBC 4. Will continue to monitor for any signs/symptoms of bleeding and will follow up with heparin level in 8 hours   Alycia Rossetti, PharmD, BCPS Clinical Pharmacist Pager: [phone] 08/26/2015 9:35 AM

## 2015-08-26 NOTE — Progress Notes (Addendum)
PROGRESS NOTE    Christopher Holder [id number] DOB: Oct 27, 1929 DOA: 08/18/2015 PCP: Mackie Pai, PA-C  HPI/Brief narrative Chart reviewed. 81 y/o wm w/ complicated medical hx including CHF, prob COPD/asked mother, OSA on CPAP, DM, CAD, HTN, extremely hard of hearing, HLD & GERD. Admitted 11/29 w/ CAP and Sepsis. Was volume resuscitated, abx initiated and admitted to the SDU. Shortly after admission developed progressive respiratory distress w/ copious sputum production. Moved to the ICU for progressive respiratory failure. Suspect that this is a mix of progressive ALI vs Pulmonary edema vs mix of the both. Needed mechanical ventilation. Improved. Extubated 08/21/15. CCM transferred care to [facility] and stepdown unit on 12/4. Requested CCM to reevaluate/follow 12/5. Cardiology consulted 12/5.  Developed atrial fibrillation overnight, which appears to be new  Assessment/Plan:  New PAF:  Occurred overnight. Appears to be new. chadsvasc 5. Defer anticoagulation to cardiology. Back in sinus  Acute hypoxic respiratory failure - Secondary to community acquired pneumonia complicated by either evolving acute lung injury versus pulmonary edema versus mixed of both - S/p VDRF. Extubated 08/21/15. - Pulmonary toilet. Titrate oxygen for saturations 88-92 percent. - Had significant wheezing wheezing overnight 12/4. Added IV Solu-Medrol. CCM re-consulted for assistance. Flutter valve. - CCM aggressively diuresed with Lasix 40 mg IV 3 times a day & PRN necessary BiPAP added. - Chest x-ray shows bilateral basal airspace opacities-as per daughter, patient has some chronic basal findings on imaging. - CT chest without contrast 12/6: Patchy groundglass airspace opacities most compatible with multifocal pneumonia and small bilateral pleural effusions - Lasix reduced due to improvement and concern for worsening renal failure.  Probable community-acquired pneumonia - Blood culture 2: Negative, sputum culture:  Negative, urine culture: Negative, urine strep antigen: Negative, urine Legionella antigen: Negative Day 10 abx. Will d/c  COPD/asthma Improving. Change solumedrol to prednisone  Severe sepsis - Secondary to community-acquired pneumonia. Improved. Sepsis physiology resolved.  Acute on chronic systolic CHF/cardiomyopathy - 2-D echo 08/21/15: LVEF 25-30 percent. Severe diffuse hypokinesis with no identifiable regional variations. Moderate AR - Complicated by volume resuscitation early on in admission for sepsis. - Appeared volume overloaded. On Bumex at home.  -Treated with aggressive IV diuresis on 12/5. Volume improved. Creatinine worsening. Lasix reduced by cardiology-agree.  - Hold ACEI secondary to acute kidney injury. - Strict intake output monitoring  No weights for several days. Will order.  BNP higher and still with some dyspnea and leg edema.  Creatinine down. Will give extra dose lasix today. May help with weaning oxygen.  Nonsustained VT - 8. Nonsustained VT noticed on 08/23/15 at 4:10 AM.  - Resumed home dose of carvedilol. Continue monitoring on telemetry. K ok. Magnesium 2.2.  Acute on stage III chronic kidney disease - Baseline creatinine may be in the 1.4 range. Admitted with creatinine of 1.7. Improved. - Monitor closely while diuretics reinitiated. - Creatinine down to 1.3 today  Lactic acidosis - Resolved.  GERD - Continue PPI  Obesity  Anemia - Stable.  Thrombocytopenia - Transient and resolved.  Type II DM with peripheral neuropathy - SSI. Uncontrolled now secondary to steroids. Still running high. Will increase lantus to 15 units. Continue gabapentin. hgb a1c 6.2  Gout stable  Extremely hard of hearing  Acute encephalopathy/mild delirium - In the setting of sepsis and hypoxia.  - Improved.   Elevated troponin/CAD Cardiology considering myoview  Hyperlipidemia - Continue statins  ? Right leg cellulitis Resolved  Prolonged QTC -  Continue monitoring.  OSA  DVT prophylaxis: Lovenox Code Status: Full  Family Communication: Discussed at length with patient's daughter Ms. [name] on 12/5. Disposition Plan: continue SDU, then SNF once cardiac workup complete and more stable   Consultants:  CCM  Cardiology  Procedures:  Extubated 11/2  Left subclavian catheter  Antibiotics:  Rocephin 11/29>>>12/1  Ceftaz 12/2>>>12/7  Azitho 11/29>>>12/2  Zosyn IV 1 dose on admission   Subjective: Breathing not yet at baseline. No other complaints  Objective: Filed Vitals:   08/26/15 0408 08/26/15 0447 08/26/15 0741 08/26/15 0818  BP: 123/66   109/79  Pulse: 63   74  Temp: 97 F (36.1 C)   97.6 F (36.4 C)  TempSrc: Axillary   Oral  Resp: 9   25  Height:      Weight:      SpO2: 99% 97% 94% 93%    Intake/Output Summary (Last 24 hours) at 08/26/15 0839 Last data filed at 08/26/15 0818  Gross per 24 hour  Intake    823 ml  Output   2050 ml  Net  -1227 ml   Filed Weights   08/23/15 0500 08/23/15 1439 08/24/15 0500  Weight: 125.2 kg (276 lb 0.3 oz) 126.4 kg (278 lb 10.6 oz) 125.6 kg (276 lb 14.4 oz)   Tele: ST now. PAF overnight  Exam:  General exam: comfortable at rest. Winded with minimal exertion. Oriented and appropriate Respiratory system: diminished throughout. Occasional slight wheeze. No rales or rhonchi Cardiovascular system: fast regular, no MGR Gastrointestinal system: Abdomen is nondistended, soft and nontender. Normal bowel sounds heard. Extremities: 1+ bilateral pitting no clubbing or cyanosis   Data Reviewed: Basic Metabolic Panel:  Recent Labs Lab 08/19/15 1845 08/20/15 0452 08/21/15 0606 08/22/15 0555 08/23/15 0555 08/24/15 0553 08/25/15 0623 08/26/15 0510  NA 141 144 145 144 140 140 143 141  K 4.1 4.1 4.0 3.9 3.6 4.6 4.1 4.3  CL 108 108 106 107 105 104 105 102  CO2 25 27 28 28 27 27 29 31   GLUCOSE 188* 142* 128* 152* 137* 197* 252* 223*  BUN 23* 31* 23* 17  14 21* 40* 47*  CREATININE 1.39* 1.41* 1.16 1.20 1.29* 1.40* 1.50* 1.33*  CALCIUM 8.5* 8.5* 8.4* 8.1* 8.1* 8.2* 8.5* 8.4*  MG 2.4 2.3 2.1 2.2 2.2  --   --   --   PHOS  --  3.0 3.4 3.2 3.0  --   --   --    Liver Function Tests: No results for input(s): AST, ALT, ALKPHOS, BILITOT, PROT, ALBUMIN in the last 168 hours. No results for input(s): LIPASE, AMYLASE in the last 168 hours. No results for input(s): AMMONIA in the last 168 hours. CBC:  Recent Labs Lab 08/20/15 0452 08/21/15 0606 08/22/15 0555 08/23/15 0555 08/25/15 0623  WBC 10.7* 7.3 9.1 8.5 8.0  HGB 11.5* 10.9* 10.3* 10.1* 10.5*  HCT 35.1* 34.4* 32.9* 32.0* 31.9*  MCV 91.2 91.5 92.2 91.7 90.6  PLT 172 169 156 171 217   Cardiac Enzymes:  Recent Labs Lab 08/19/15 1405  TROPONINI 0.27*   BNP (last 3 results) No results for input(s): PROBNP in the last 8760 hours. CBG:  Recent Labs Lab 08/24/15 2131 08/25/15 0830 08/25/15 1239 08/25/15 1600 08/25/15 2219  GLUCAP 242* 201* 276* 269* 283*    Recent Results (from the past 240 hour(s))  Culture, blood (routine x 2)     Status: None   Collection Time: 08/18/15  8:38 AM  Result Value Ref Range Status   Specimen Description BLOOD LEFT ARM  Final  Special Requests BOTTLES DRAWN AEROBIC AND ANAEROBIC 5CC  Final   Culture NO GROWTH 5 DAYS  Final   Report Status 08/23/2015 FINAL  Final  Urine culture     Status: None   Collection Time: 08/18/15  8:38 AM  Result Value Ref Range Status   Specimen Description URINE, CATHETERIZED  Final   Special Requests NONE  Final   Culture NO GROWTH 1 DAY  Final   Report Status 08/19/2015 FINAL  Final  Culture, blood (routine x 2)     Status: None   Collection Time: 08/18/15  9:00 AM  Result Value Ref Range Status   Specimen Description BLOOD RIGHT ARM  Final   Special Requests BOTTLES DRAWN AEROBIC AND ANAEROBIC 10CC  Final   Culture NO GROWTH 5 DAYS  Final   Report Status 08/23/2015 FINAL  Final  MRSA PCR Screening      Status: None   Collection Time: 08/18/15  1:23 PM  Result Value Ref Range Status   MRSA by PCR NEGATIVE NEGATIVE Final    Comment:        The GeneXpert MRSA Assay (FDA approved for NASAL specimens only), is one component of a comprehensive MRSA colonization surveillance program. It is not intended to diagnose MRSA infection nor to guide or monitor treatment for MRSA infections.   Culture, respiratory (NON-Expectorated)     Status: None   Collection Time: 08/18/15  5:14 PM  Result Value Ref Range Status   Specimen Description TRACHEAL ASPIRATE  Final   Special Requests NONE  Final   Gram Stain   Final    FEW WBC PRESENT,BOTH PMN AND MONONUCLEAR RARE SQUAMOUS EPITHELIAL CELLS PRESENT NO ORGANISMS SEEN Performed at [facility]    Culture   Final    NO GROWTH 2 DAYS Performed at [facility]    Report Status 08/20/2015 FINAL  Final         Studies: Ct Chest Wo Contrast  08/25/2015  CLINICAL DATA:  81 year old male with pneumonia and respiratory distress EXAM: CT CHEST WITHOUT CONTRAST TECHNIQUE: Multidetector CT imaging of the chest was performed following the standard protocol without IV contrast. COMPARISON:  Chest radiograph dated 08/24/2015 FINDINGS: Evaluation of this exam is limited in the absence of intravenous contrast. Bilateral patchy ground-glass airspace opacity with areas of air bronchogram noted most compatible with multifocal pneumonia. The small bilateral pleural effusions with associated partial compressive atelectasis of the lower lobes. There is no pneumothorax. The central airways are patent. There is mild atherosclerotic calcification of the thoracic aorta. The pulmonary arteries are grossly unremarkable. There is cardiomegaly. No pericardial effusion. There is coronary vascular calcification. No hilar or mediastinal adenopathy. A left subclavian central line with tip in the central SVC. The visualized esophagus and thyroid gland appear grossly  unremarkable. There is no axillary adenopathy. The chest wall soft tissues appear unremarkable. There is degenerative changes of the spine. No acute fracture. Cholelithiasis.  The visualized upper abdomen appear unremarkable. IMPRESSION: Patchy ground-glass airspace opacities most compatible with multi focal pneumonia. Clinical correlation and follow-up recommended. Small bilateral pleural effusions. Cardiomegaly. Electronically Signed   By: Anner Crete M.D.   On: 08/25/2015 01:52   Dg Chest Port 1 View  08/25/2015  CLINICAL DATA:  81 year old male with acute respiratory failure. EXAM: PORTABLE CHEST 1 VIEW COMPARISON:  Chest x-ray 08/24/2015. FINDINGS: There is a left-sided subclavian central venous catheter with tip terminating in the distal superior vena cava. Patchy asymmetrically distributed airspace consolidation throughout the  lungs bilaterally (right greater than left), with some sparing of the left upper lobe, similar to the prior study, most compatible with severe multilobar bronchopneumonia. Small left pleural effusion. Pulmonary vasculature is obscured. Heart size appears borderline enlarged. The patient is rotated to the left on today's exam, resulting in distortion of the mediastinal contours and reduced diagnostic sensitivity and specificity for mediastinal pathology. IMPRESSION: 1. Appearance of the chest is very similar to the prior study, and based on comparison with chest CT 08/25/2015, this most likely reflects multilobar bronchopneumonia. Electronically Signed   By: Vinnie Langton M.D.   On: 08/25/2015 07:08        Scheduled Meds: . allopurinol  100 mg Oral BID  . aspirin  81 mg Oral Daily  . budesonide (PULMICORT) nebulizer solution  0.5 mg Nebulization BID  . carvedilol  6.25 mg Oral BID WC  . cefTAZidime (FORTAZ)  IV  2 g Intravenous 3 times per day  . enoxaparin (LOVENOX) injection  40 mg Subcutaneous Daily  . feeding supplement (ENSURE ENLIVE)  237 mL Oral BID BM  .  feeding supplement (PRO-STAT SUGAR FREE 64)  30 mL Oral TID  . furosemide  40 mg Intravenous Daily  . gabapentin  600 mg Oral TID  . insulin aspart  0-5 Units Subcutaneous QHS  . insulin aspart  0-9 Units Subcutaneous TID WC  . insulin glargine  10 Units Subcutaneous QHS  . ipratropium-albuterol  3 mL Nebulization Q4H  . methylPREDNISolone (SOLU-MEDROL) injection  60 mg Intravenous Q12H  . pantoprazole  40 mg Oral Daily  . potassium chloride  10 mEq Oral BID  . simvastatin  20 mg Oral Daily  . sodium chloride  3 mL Intravenous Q12H   Continuous Infusions:    Principal Problem:   Sepsis due to pneumonia [facility]) Active Problems:   Diabetes mellitus type 2, controlled ([place])   HTN (hypertension)   Hyperlipidemia   Diabetic neuropathy (H[facility])   Coronary artery disease involving native coronary artery of native heart without angina pectoris   Obesity   Obstructive sleep apnea   AKI (acute kidney injury) ([place])   Metabolic encephalopathy   Thrombocytopenia (HCC)   Acute respiratory failure with hypoxia (HCC)   Hard of hearing   Chronic sinus bradycardia   CAP (community acquired pneumonia)   Acute pulmonary edema (HCC)   Septic shock (HCC)   Acute respiratory failure with hypoxemia (HCC)   Systolic CHF, acute on chronic (HCC)   Prolonged QT interval   ARDS (adult respiratory distress syndrome) (HCC)   Renal failure, acute on chronic (HCC)   Acute systolic CHF (congestive heart failure) (HCC)   PAF (paroxysmal atrial fibrillation) ([place])    Time spent: 45 minutes    Delfina Redwood, MD [facility]  [email or web address] Password [email or web address] 08/26/2015, 8:39 AM    LOS: 8 days

## 2015-08-26 NOTE — NC FL2 (Signed)
[place] LEVEL OF CARE SCREENING TOOL     IDENTIFICATION  Patient Name: Christopher Holder Birthdate: [birth year] Sex: male Admission Date (Current Location): 08/18/2015  [facility] and [place] Number: Herbalist and Address:  [facility] [place], [place] [address] [place], [place] [address]      Provider Number: [id number]  Attending Physician Name and Address:  Delfina Redwood, MD  Relative Name and Phone Number:       Current Level of Care: Hospital Recommended Level of Care: [place] Prior Approval Number:    Date Approved/Denied:   PASRR Number: [id number]  Discharge Plan: SNF    Current Diagnoses: Patient Active Problem List   Diagnosis Date Noted  . ARDS (adult respiratory distress syndrome) ([place])   . Renal failure, acute on chronic (HCC)   . Acute systolic CHF (congestive heart failure) ([place])   . Acute respiratory failure with hypoxemia ([place])   . Systolic CHF, acute on chronic (HCC)   . Prolonged QT interval   . Sepsis due to pneumonia ([place]) 08/18/2015  . AKI (acute kidney injury) ([place]) 08/18/2015  . Metabolic encephalopathy [id number]  . Thrombocytopenia ([place]) 08/18/2015  . Acute respiratory failure with hypoxia ([place]) 08/18/2015  . Hard of hearing 08/18/2015  . Chronic sinus bradycardia 08/18/2015  . CAP (community acquired pneumonia) 08/18/2015  . Acute pulmonary edema ([place]) 08/18/2015  . Septic shock ([place]) 08/18/2015  . Chronic cough 08/07/2015  . Onychomycosis 07/28/2015  . Injury by nail 07/28/2015  . Coronary artery disease involving native coronary artery of native heart without angina pectoris 06/19/2015  . Obesity 06/19/2015  . Obstructive sleep apnea 06/19/2015  . Other emphysema ([place]) 06/19/2015  . Diabetes mellitus type 2, controlled ([place]) 05/27/2015  . HTN (hypertension) 05/27/2015  . Hyperlipidemia 05/27/2015  . GERD (gastroesophageal reflux disease) 05/27/2015  . Allergic rhinitis  05/27/2015  . Diabetic neuropathy ([place]) 05/27/2015  . CHF (congestive heart failure) ([place]) 05/27/2015    Orientation ACTIVITIES/SOCIAL BLADDER RESPIRATION    Self, Time, Situation, Place  Active Continent O2 (As needed) (5 L/min   CPAP)  BEHAVIORAL SYMPTOMS/MOOD NEUROLOGICAL BOWEL NUTRITION STATUS      Continent Diet (Heart Healthy/Carb Modified)  PHYSICIAN VISITS COMMUNICATION OF NEEDS Height & Weight Skin  30 days Verbally 6\' 1"  (185.4 cm) 276 lbs. Skin abrasions (Abraision L knee)          AMBULATORY STATUS RESPIRATION    Supervision limited O2 (As needed) (5 L/min   CPAP)      Personal Care Assistance Level of Assistance  Bathing, Dressing Bathing Assistance: Limited assistance   Dressing Assistance: Limited assistance      Functional Limitations [place]  PT (By licensed PT), OT (By licensed OT)     PT Frequency: 5 OT Frequency: 5           Additional Factors Info  Code Status, Allergies Code Status Info: Full Allergies Info: Percocet, Sulfa antibiotics           Current Medications (08/26/2015):  This is the current hospital active medication list Current Facility-Administered Medications  Medication Dose Route Frequency Provider Last Rate Last Dose  . albuterol (PROVENTIL) (2.5 MG/3ML) 0.083% nebulizer solution 3 mL  3 mL Inhalation Q2H PRN Dianne Dun, NP   3 mL at 08/24/15 1744  . allopurinol (ZYLOPRIM) tablet 100 mg  100 mg Oral  BID Modena Jansky, MD   100 mg at 08/25/15 2127  . aspirin chewable tablet 81 mg  81 mg Oral Daily Modena Jansky, MD   81 mg at 08/25/15 [id number]  . budesonide (PULMICORT) nebulizer solution 0.5 mg  0.5 mg Nebulization BID Erick Colace, NP   0.5 mg at 08/26/15 0740  . carvedilol (COREG) tablet 6.25 mg  6.25 mg Oral BID WC Modena Jansky, MD   6.25 mg at 08/25/15 1737  . cefTAZidime (FORTAZ) 2 g in dextrose 5 % 50 mL IVPB  2 g Intravenous 3 times per day Rush Farmer, MD   2 g at 08/26/15 0509  . enoxaparin (LOVENOX) injection 40 mg  40 mg Subcutaneous Daily Samella Parr, NP   40 mg at 08/25/15 0944  . feeding supplement (ENSURE ENLIVE) (ENSURE ENLIVE) liquid 237 mL  237 mL Oral BID BM Asencion Islam, RD   237 mL at 08/25/15 1622  . feeding supplement (PRO-STAT SUGAR FREE 64) liquid 30 mL  30 mL Oral TID Modena Jansky, MD   30 mL at 08/25/15 2127  . furosemide (LASIX) injection 40 mg  40 mg Intravenous Daily Pixie Casino, MD      . gabapentin (NEURONTIN) tablet 600 mg  600 mg Oral TID Modena Jansky, MD   600 mg at 08/25/15 2127  . insulin aspart (novoLOG) injection 0-5 Units  0-5 Units Subcutaneous QHS Modena Jansky, MD   3 Units at 08/25/15 2228  . insulin aspart (novoLOG) injection 0-9 Units  0-9 Units Subcutaneous TID WC Modena Jansky, MD      . insulin glargine (LANTUS) injection 10 Units  10 Units Subcutaneous QHS Modena Jansky, MD   10 Units at 08/25/15 2128  . ipratropium-albuterol (DUONEB) 0.5-2.5 (3) MG/3ML nebulizer solution 3 mL  3 mL Nebulization Q4H Brand Males, MD   3 mL at 08/26/15 0740  . methylPREDNISolone sodium succinate (SOLU-MEDROL) 125 mg/2 mL injection 60 mg  60 mg Intravenous Q12H Modena Jansky, MD   60 mg at 08/25/15 2015  . pantoprazole (PROTONIX) EC tablet 40 mg  40 mg Oral Daily Jake Church Masters, RPH   40 mg at 08/25/15 [id number]  . potassium chloride (K-DUR,KLOR-CON) CR tablet 10 mEq  10 mEq Oral BID Modena Jansky, MD   10 mEq at 08/25/15 2127  . simvastatin (ZOCOR) tablet 20 mg  20 mg Oral Daily Modena Jansky, MD   20 mg at 08/25/15 0943  . sodium chloride 0.9 % injection 3 mL  3 mL Intravenous Q12H Samella Parr, NP   3 mL at 08/25/15 2128     Discharge Medications: Please see discharge summary for a list of discharge medications.  Relevant Imaging Results:  Relevant Lab Results:  Recent Labs    Additional Information SSN  [facility] [address], [place], [place]

## 2015-08-26 NOTE — Progress Notes (Signed)
ANTICOAGULATION CONSULT NOTE - FOLLOW UP    HL = 0.26 (goal 0.3 - 0.7 units/mL) Heparin dosing weight = 108 kg   Assessment: 85 YOM with new-onset AFib to continue on IV heparin.  Heparin level is sub-therapeutic.  No issue with infusion per RN.  Patient was bleeding from another IV site and it stopped after RN applied pressure and replaced the dressing.   Plan: - Increase heparin gtt to 1700 units/hr - F/U AM labs - Watch for bleeding    Tassie Pollett D. Mina Marble, PharmD, BCPS 08/26/2015, 7:11 PM

## 2015-08-26 NOTE — Progress Notes (Addendum)
DAILY PROGRESS NOTE  Subjective:  New PAF overnight - CHADSVASC of 5.  States breathing is better today. Creatinine improved today to 1.33 (from 1.5). BNP elevated at 1054. BUN rising - may also be due to steroids.   Objective:  Temp:  [97 F (36.1 C)-97.8 F (36.6 C)] 97.6 F (36.4 C) (12/07 0818) Pulse Rate:  [51-112] 74 (12/07 0818) Resp:  [9-25] 25 (12/07 0818) BP: (107-143)/(48-79) 109/79 mmHg (12/07 0818) SpO2:  [88 %-99 %] 93 % (12/07 0818) Weight change:   Intake/Output from previous day: 12/06 0701 - 12/07 0700 In: 823 [P.O.:720; I.V.:3; IV Piggyback:100] Out: 2111 [Urine:1575]  Intake/Output from this shift: Total I/O In: -  Out: 475 [Urine:475]  Medications: Current Facility-Administered Medications  Medication Dose Route Frequency Provider Last Rate Last Dose  . albuterol (PROVENTIL) (2.5 MG/3ML) 0.083% nebulizer solution 3 mL  3 mL Inhalation Q2H PRN Dianne Dun, NP   3 mL at 08/24/15 1744  . allopurinol (ZYLOPRIM) tablet 100 mg  100 mg Oral BID Modena Jansky, MD   100 mg at 08/25/15 2127  . aspirin chewable tablet 81 mg  81 mg Oral Daily Modena Jansky, MD   81 mg at 08/25/15 5520  . budesonide (PULMICORT) nebulizer solution 0.5 mg  0.5 mg Nebulization BID Erick Colace, NP   0.5 mg at 08/26/15 0740  . carvedilol (COREG) tablet 6.25 mg  6.25 mg Oral BID WC Modena Jansky, MD   6.25 mg at 08/26/15 8022  . enoxaparin (LOVENOX) injection 40 mg  40 mg Subcutaneous Daily Samella Parr, NP   40 mg at 08/25/15 0944  . feeding supplement (ENSURE ENLIVE) (ENSURE ENLIVE) liquid 237 mL  237 mL Oral BID BM Asencion Islam, RD   237 mL at 08/26/15 0824  . feeding supplement (PRO-STAT SUGAR FREE 64) liquid 30 mL  30 mL Oral TID Modena Jansky, MD   30 mL at 08/25/15 2127  . furosemide (LASIX) injection 40 mg  40 mg Intravenous BID Delfina Redwood, MD      . gabapentin (NEURONTIN) tablet 600 mg  600 mg Oral TID Modena Jansky, MD   600 mg at  08/25/15 2127  . insulin aspart (novoLOG) injection 0-5 Units  0-5 Units Subcutaneous QHS Modena Jansky, MD   3 Units at 08/25/15 2228  . insulin aspart (novoLOG) injection 0-9 Units  0-9 Units Subcutaneous TID WC Modena Jansky, MD   2 Units at 08/26/15 [phone]  . insulin glargine (LANTUS) injection 15 Units  15 Units Subcutaneous QHS Delfina Redwood, MD      . ipratropium-albuterol (DUONEB) 0.5-2.5 (3) MG/3ML nebulizer solution 3 mL  3 mL Nebulization Q4H Brand Males, MD   3 mL at 08/26/15 0740  . pantoprazole (PROTONIX) EC tablet 40 mg  40 mg Oral Daily Jake Church Masters, RPH   40 mg at 08/25/15 2244  . potassium chloride (K-DUR,KLOR-CON) CR tablet 10 mEq  10 mEq Oral BID Modena Jansky, MD   10 mEq at 08/25/15 2127  . predniSONE (DELTASONE) tablet 40 mg  40 mg Oral BID WC Delfina Redwood, MD      . simvastatin (ZOCOR) tablet 20 mg  20 mg Oral Daily Modena Jansky, MD   20 mg at 08/25/15 0943  . sodium chloride 0.9 % injection 3 mL  3 mL Intravenous Q12H Samella Parr, NP   3 mL at 08/25/15 2128    Physical  Exam: General appearance: alert, no distress and moderately obese Neck: JVD - 1 cm above sternal notch and no carotid bruit Lungs: diminished breath sounds bilaterally, rales bilaterally and wheezes bilaterally Heart: irregularly irregular rhythm Abdomen: soft, non-tender; bowel sounds normal; no masses,  no organomegaly Extremities: extremities normal, atraumatic, no cyanosis or edema Pulses: 2+ and symmetric Skin: Skin color, texture, turgor normal. No rashes or lesions Neurologic: Grossly normal PSych: Pleasant  Lab Results: Results for orders placed or performed during the hospital encounter of 08/18/15 (from the past 48 hour(s))  Glucose, capillary     Status: Abnormal   Collection Time: 08/24/15  9:34 AM  Result Value Ref Range   Glucose-Capillary 239 (H) 65 - 99 mg/dL  Glucose, capillary     Status: Abnormal   Collection Time: 08/24/15 12:34 PM  Result  Value Ref Range   Glucose-Capillary 293 (H) 65 - 99 mg/dL  Glucose, capillary     Status: Abnormal   Collection Time: 08/24/15  4:22 PM  Result Value Ref Range   Glucose-Capillary 369 (H) 65 - 99 mg/dL  Glucose, capillary     Status: Abnormal   Collection Time: 08/24/15  9:31 PM  Result Value Ref Range   Glucose-Capillary 242 (H) 65 - 99 mg/dL  Basic metabolic panel     Status: Abnormal   Collection Time: 08/25/15  6:23 AM  Result Value Ref Range   Sodium 143 135 - 145 mmol/L   Potassium 4.1 3.5 - 5.1 mmol/L   Chloride 105 101 - 111 mmol/L   CO2 29 22 - 32 mmol/L   Glucose, Bld 252 (H) 65 - 99 mg/dL   BUN 40 (H) 6 - 20 mg/dL   Creatinine, Ser 1.50 (H) 0.61 - 1.24 mg/dL   Calcium 8.5 (L) 8.9 - 10.3 mg/dL   GFR calc non Af Amer 41 (L) >60 mL/min   GFR calc Af Amer 48 (L) >60 mL/min    Comment: (NOTE) The eGFR has been calculated using the CKD EPI equation. This calculation has not been validated in all clinical situations. eGFR's persistently <60 mL/min signify possible Chronic Kidney Disease.    Anion gap 9 5 - 15  CBC     Status: Abnormal   Collection Time: 08/25/15  6:23 AM  Result Value Ref Range   WBC 8.0 4.0 - 10.5 K/uL   RBC 3.52 (L) 4.22 - 5.81 MIL/uL   Hemoglobin 10.5 (L) 13.0 - 17.0 g/dL   HCT 31.9 (L) 39.0 - 52.0 %   MCV 90.6 78.0 - 100.0 fL   MCH 29.8 26.0 - 34.0 pg   MCHC 32.9 30.0 - 36.0 g/dL   RDW 15.9 (H) 11.5 - 15.5 %   Platelets 217 150 - 400 K/uL  Glucose, capillary     Status: Abnormal   Collection Time: 08/25/15  8:30 AM  Result Value Ref Range   Glucose-Capillary 201 (H) 65 - 99 mg/dL  Glucose, capillary     Status: Abnormal   Collection Time: 08/25/15 12:39 PM  Result Value Ref Range   Glucose-Capillary 276 (H) 65 - 99 mg/dL  Glucose, capillary     Status: Abnormal   Collection Time: 08/25/15  4:00 PM  Result Value Ref Range   Glucose-Capillary 269 (H) 65 - 99 mg/dL  Glucose, capillary     Status: Abnormal   Collection Time: 08/25/15 10:19  PM  Result Value Ref Range   Glucose-Capillary 283 (H) 65 - 99 mg/dL  Brain natriuretic peptide  Status: Abnormal   Collection Time: 08/26/15  5:10 AM  Result Value Ref Range   B Natriuretic Peptide 1054.2 (H) 0.0 - 100.0 pg/mL  Basic metabolic panel     Status: Abnormal   Collection Time: 08/26/15  5:10 AM  Result Value Ref Range   Sodium 141 135 - 145 mmol/L   Potassium 4.3 3.5 - 5.1 mmol/L   Chloride 102 101 - 111 mmol/L   CO2 31 22 - 32 mmol/L   Glucose, Bld 223 (H) 65 - 99 mg/dL   BUN 47 (H) 6 - 20 mg/dL   Creatinine, Ser 1.33 (H) 0.61 - 1.24 mg/dL   Calcium 8.4 (L) 8.9 - 10.3 mg/dL   GFR calc non Af Amer 48 (L) >60 mL/min   GFR calc Af Amer 55 (L) >60 mL/min    Comment: (NOTE) The eGFR has been calculated using the CKD EPI equation. This calculation has not been validated in all clinical situations. eGFR's persistently <60 mL/min signify possible Chronic Kidney Disease.    Anion gap 8 5 - 15  Glucose, capillary     Status: Abnormal   Collection Time: 08/26/15  8:16 AM  Result Value Ref Range   Glucose-Capillary 191 (H) 65 - 99 mg/dL    Imaging: Ct Chest Wo Contrast  08/25/2015  CLINICAL DATA:  81 year old male with pneumonia and respiratory distress EXAM: CT CHEST WITHOUT CONTRAST TECHNIQUE: Multidetector CT imaging of the chest was performed following the standard protocol without IV contrast. COMPARISON:  Chest radiograph dated 08/24/2015 FINDINGS: Evaluation of this exam is limited in the absence of intravenous contrast. Bilateral patchy ground-glass airspace opacity with areas of air bronchogram noted most compatible with multifocal pneumonia. The small bilateral pleural effusions with associated partial compressive atelectasis of the lower lobes. There is no pneumothorax. The central airways are patent. There is mild atherosclerotic calcification of the thoracic aorta. The pulmonary arteries are grossly unremarkable. There is cardiomegaly. No pericardial effusion.  There is coronary vascular calcification. No hilar or mediastinal adenopathy. A left subclavian central line with tip in the central SVC. The visualized esophagus and thyroid gland appear grossly unremarkable. There is no axillary adenopathy. The chest wall soft tissues appear unremarkable. There is degenerative changes of the spine. No acute fracture. Cholelithiasis.  The visualized upper abdomen appear unremarkable. IMPRESSION: Patchy ground-glass airspace opacities most compatible with multi focal pneumonia. Clinical correlation and follow-up recommended. Small bilateral pleural effusions. Cardiomegaly. Electronically Signed   By: Anner Crete M.D.   On: 08/25/2015 01:52   Dg Chest Port 1 View  08/25/2015  CLINICAL DATA:  81 year old male with acute respiratory failure. EXAM: PORTABLE CHEST 1 VIEW COMPARISON:  Chest x-ray 08/24/2015. FINDINGS: There is a left-sided subclavian central venous catheter with tip terminating in the distal superior vena cava. Patchy asymmetrically distributed airspace consolidation throughout the lungs bilaterally (right greater than left), with some sparing of the left upper lobe, similar to the prior study, most compatible with severe multilobar bronchopneumonia. Small left pleural effusion. Pulmonary vasculature is obscured. Heart size appears borderline enlarged. The patient is rotated to the left on today's exam, resulting in distortion of the mediastinal contours and reduced diagnostic sensitivity and specificity for mediastinal pathology. IMPRESSION: 1. Appearance of the chest is very similar to the prior study, and based on comparison with chest CT 08/25/2015, this most likely reflects multilobar bronchopneumonia. Electronically Signed   By: Vinnie Langton M.D.   On: 08/25/2015 07:08    Assessment:  Principal Problem:   Sepsis  due to pneumonia [facility]) Active Problems:   Diabetes mellitus type 2, controlled ([place])   HTN (hypertension)   Hyperlipidemia   Diabetic  neuropathy ([place])   Coronary artery disease involving native coronary artery of native heart without angina pectoris   Obesity   Obstructive sleep apnea   AKI (acute kidney injury) ([place])   Metabolic encephalopathy   Thrombocytopenia (HCC)   Acute respiratory failure with hypoxia (HCC)   Hard of hearing   Chronic sinus bradycardia   CAP (community acquired pneumonia)   Acute pulmonary edema (HCC)   Septic shock (HCC)   Acute respiratory failure with hypoxemia (HCC)   Systolic CHF, acute on chronic (HCC)   Prolonged QT interval   ARDS (adult respiratory distress syndrome) (HCC)   Renal failure, acute on chronic (HCC)   Acute systolic CHF (congestive heart failure) (HCC)   PAF (paroxysmal atrial fibrillation) (HCC)   Plan:  New a-fib overnight (onset <48 hrs, no history of a-fib). Persistent at this point. Rate controlled. Start IV heparin as he may need a cath during this hospitalization. Will try to arrange for elective cardioversion tomorrow if he does not spontaneously convert. Net negative -1.1L yesterday. BNP elevated, however creatinine improved today. Diuretics were reduced due to rising creatinine, now on lasix 40 mg IV BID. If we can restore sinus rhythm, will still need an ischemia evaluation later this week. NPO p MN tonight. Ultimately, a good candidate for DOAC such as Eliquis.   Time Spent Directly with Patient:  15 minutes  Length of Stay:  LOS: 8 days   Pixie Casino, MD, [facility] Attending Cardiologist [place] 08/26/2015, 9:03 AM

## 2015-08-26 NOTE — Progress Notes (Signed)
Dr Conley Canal made aware that pt went into New onset At Fib during the night. Pt was told and he said he didn't know - does not feel it. 12 lead EKG was obtained at 7:28 this am shows At Fib

## 2015-08-27 ENCOUNTER — Inpatient Hospital Stay (HOSPITAL_COMMUNITY): Payer: Medicare Other

## 2015-08-27 DIAGNOSIS — I481 Persistent atrial fibrillation: Secondary | ICD-10-CM

## 2015-08-27 LAB — BASIC METABOLIC PANEL
Anion gap: 8 (ref 5–15)
BUN: 52 mg/dL — ABNORMAL HIGH (ref 6–20)
CHLORIDE: 100 mmol/L — AB (ref 101–111)
CO2: 33 mmol/L — ABNORMAL HIGH (ref 22–32)
CREATININE: 1.45 mg/dL — AB (ref 0.61–1.24)
Calcium: 8.4 mg/dL — ABNORMAL LOW (ref 8.9–10.3)
GFR, EST AFRICAN AMERICAN: 50 mL/min — AB (ref >60)
GFR, EST NON AFRICAN AMERICAN: 43 mL/min — AB (ref >60)
Glucose, Bld: 229 mg/dL — ABNORMAL HIGH (ref 65–99)
POTASSIUM: 4.3 mmol/L (ref 3.5–5.1)
SODIUM: 141 mmol/L (ref 135–145)

## 2015-08-27 LAB — GLUCOSE, CAPILLARY
GLUCOSE-CAPILLARY: 183 mg/dL — AB (ref 65–99)
GLUCOSE-CAPILLARY: 350 mg/dL — AB (ref 65–99)
Glucose-Capillary: 191 mg/dL — ABNORMAL HIGH (ref 65–99)
Glucose-Capillary: 243 mg/dL — ABNORMAL HIGH (ref 65–99)

## 2015-08-27 LAB — CBC
HEMATOCRIT: 34.2 % — AB (ref 39.0–52.0)
Hemoglobin: 10.8 g/dL — ABNORMAL LOW (ref 13.0–17.0)
MCH: 28.7 pg (ref 26.0–34.0)
MCHC: 31.6 g/dL (ref 30.0–36.0)
MCV: 91 fL (ref 78.0–100.0)
Platelets: 252 10*3/uL (ref 150–400)
RBC: 3.76 MIL/uL — ABNORMAL LOW (ref 4.22–5.81)
RDW: 15.7 % — AB (ref 11.5–15.5)
WBC: 10.6 10*3/uL — AB (ref 4.0–10.5)

## 2015-08-27 LAB — HEPARIN LEVEL (UNFRACTIONATED)
HEPARIN UNFRACTIONATED: 0.41 [IU]/mL (ref 0.30–0.70)
HEPARIN UNFRACTIONATED: 0.51 [IU]/mL (ref 0.30–0.70)

## 2015-08-27 MED ORDER — PREDNISONE 10 MG PO TABS
30.0000 mg | ORAL_TABLET | Freq: Every day | ORAL | Status: DC
Start: 2015-08-27 — End: 2015-08-28
  Administered 2015-08-27 – 2015-08-28 (×2): 30 mg via ORAL
  Filled 2015-08-27 (×2): qty 1

## 2015-08-27 MED ORDER — IPRATROPIUM-ALBUTEROL 0.5-2.5 (3) MG/3ML IN SOLN
3.0000 mL | Freq: Four times a day (QID) | RESPIRATORY_TRACT | Status: DC
  Administered 2015-08-27 – 2015-09-01 (×18): 3 mL via RESPIRATORY_TRACT
  Filled 2015-08-27 (×18): qty 3

## 2015-08-27 MED ORDER — INSULIN GLARGINE 100 UNIT/ML ~~LOC~~ SOLN
20.0000 [IU] | Freq: Every day | SUBCUTANEOUS | Status: DC
  Administered 2015-08-27: 20 [IU] via SUBCUTANEOUS
  Filled 2015-08-27 (×2): qty 0.2

## 2015-08-27 NOTE — Care Management Important Message (Signed)
Important Message  Patient Details  Name: Christopher Holder MRN: [id number] Date of Birth: 1930-03-05   Medicare Important Message Given:  Yes    Christopher Holder 08/27/2015, 2:03 PM

## 2015-08-27 NOTE — Progress Notes (Signed)
Inpatient Diabetes Program Recommendations  AACE/ADA: New Consensus Statement on Inpatient Glycemic Control (2015)  Target Ranges:  Prepandial:   less than 140 mg/dL      Peak postprandial:   less than 180 mg/dL (1-2 hours)      Critically ill patients:  140 - 180 mg/dL   Review of Glycemic Control  Results for ESTA, RANFT (MRN [id number]) as of 08/27/2015 10:10  Ref. Range 08/26/2015 08:16 08/26/2015 12:19 08/26/2015 17:03 08/26/2015 22:17  Glucose-Capillary Latest Ref Range: 65-99 mg/dL 191 (H) 305 (H) 315 (H) 248 (H)   Post-prandial blood sugars elevated. Would benefit from meal coverage insulin.  Recommendations: Add Novolog 3 units tidwc for meal coverage insulin.  Will follow. Thank you. Lorenda Peck, RD, LDN, CDE Inpatient Diabetes Coordinator [phone]

## 2015-08-27 NOTE — Progress Notes (Signed)
ANTICOAGULATION CONSULT NOTE - Follow Up Consult  Pharmacy Consult for Heparin Indication: New-onset Afib  Allergies  Allergen Reactions  . Percocet [Oxycodone-Acetaminophen] Diarrhea  . Sulfa Antibiotics Diarrhea and Nausea And Vomiting    Patient Measurements: Height: 6' (182.9 cm) Weight: 278 lb 3.5 oz (126.2 kg) IBW/kg (Calculated) : 77.6 Heparin Dosing Weight: 108 kg  Vital Signs: Temp: 97.9 F (36.6 C) (12/08 1221) Temp Source: Oral (12/08 1221) BP: 110/61 mmHg (12/08 1221) Pulse Rate: 57 (12/08 1221)  Labs:  Recent Labs  08/25/15 0623 08/26/15 0510 08/26/15 1850 08/27/15 0455 08/27/15 1137  HGB 10.5*  --   --  10.8*  --   HCT 31.9*  --   --  34.2*  --   PLT 217  --   --  252  --   HEPARINUNFRC  --   --  0.26* 0.41 0.51  CREATININE 1.50* 1.33*  --  1.45*  --     Estimated Creatinine Clearance: 53 mL/min (by C-G formula based on Cr of 1.45).   Medications:  Heparin @ 1700 units/hr (17 ml/hr)  Assessment: 85 YOM with new-onset Afib overnight. CHADS2VASC score~5. Pharmacy was consulted to start heparin on 12/7 for anticoagulation. Cards considering cardioversion if he does not spontaneously convert. Holding off on po anticoagulation for now due to possible need for a cath during this hospitalization.   Heparin level this afternoon remains therapeutic (0.51 << 0.41, goal of 0.3-0.7). CBC stable. No overt bleeding noted.   Goal of Therapy:  Heparin level 0.3-0.7 units/ml Monitor platelets by anticoagulation protocol: Yes   Plan:  1. Continue heparin at 1700 units/hr (17 ml/hr) 2. Will continue to monitor for any signs/symptoms of bleeding and will follow up with heparin level in the a.m.   Alycia Rossetti, PharmD, BCPS Clinical Pharmacist Pager: [phone] 08/27/2015 12:40 PM

## 2015-08-27 NOTE — Progress Notes (Addendum)
PROGRESS NOTE    Christopher Holder [id number] DOB: [birth year] DOA: 08/18/2015 PCP: Mackie Pai, PA-C  HPI/Brief narrative Chart reviewed. 81 y/o wm w/ complicated medical hx including CHF, prob COPD/asked mother, OSA on CPAP, DM, CAD, HTN, extremely hard of hearing, HLD & GERD. Admitted 11/29 w/ CAP and Sepsis. Was volume resuscitated, abx initiated and admitted to the SDU. Shortly after admission developed progressive respiratory distress w/ copious sputum production. Moved to the ICU for progressive respiratory failure. Suspect that this is a mix of progressive ALI vs Pulmonary edema vs mix of the both. Needed mechanical ventilation. Improved. Extubated 08/21/15. CCM transferred care to [facility] and stepdown unit on 12/4. Requested CCM to reevaluate/follow 12/5. Cardiology consulted 12/5.  Developed atrial fibrillation overnight, which appears to be new  Assessment/Plan:  New PAF:  Now on heparin gtt. Possible DCCV per cardiology. chadsvasc 5  Acute hypoxic respiratory failure - Secondary to community acquired pneumonia complicated by either evolving acute lung injury versus pulmonary edema versus mixed of both - S/p VDRF. Extubated 08/21/15. Improving. Will wean oxygen as tolerated.   Probable community-acquired pneumonia Treated with 10 days abx  COPD/asthma Improving. No wheeze. Wean steroids  Severe sepsis - Secondary to community-acquired pneumonia. Improved. Sepsis physiology resolved.  Acute on chronic systolic CHF/cardiomyopathy - 2-D echo 08/21/15: LVEF 25-30 percent. Severe diffuse hypokinesis with no identifiable regional variations. Moderate AR - Complicated by volume resuscitation early on in admission for sepsis. Peak weight 282. 278 lb today. Good diuresis on bid iv lasix - Hold ACEI secondary to acute kidney injury. Creatinine stable  Nonsustained VT On carvedilol  Acute on stage III chronic kidney disease - Baseline creatinine may be in the 1.4 range. Admitted  with creatinine of 1.7. Improved. - Monitor closely while diuretics reinitiated. - Creatinine down to 1.3 today  GERD - Continue PPI  Anemia - Stable.  Thrombocytopenia - Transient and resolved.  Type II DM with peripheral neuropathy - SSI. Uncontrolled now secondary to steroids. Still running high. Will increase lantus to 20 units. Continue gabapentin. hgb a1c 6.2  Gout stable  Extremely hard of hearing  Acute encephalopathy/mild delirium - In the setting of sepsis and hypoxia.  - Improved.   Elevated troponin/CAD Cardiology considering myoview  Hyperlipidemia - Continue statins  ? Right leg cellulitis Resolved  Prolonged QTC - Continue monitoring.  OSA  DVT prophylaxis: Lovenox Code Status: Full Family Communication: Discussed at length with patient's daughter Ms. [name] on 12/5. Disposition Plan: continue SDU, then SNF once cardiac workup complete and more stable   Consultants:  CCM  Cardiology  Procedures:  Extubated 11/2  Left subclavian catheter  Antibiotics:  Rocephin 11/29>>>12/1  Ceftaz 12/2>>>12/7  Azitho 11/29>>>12/2  Zosyn IV 1 dose on admission   Subjective: Breathing not yet at baseline, but improving  Objective: Filed Vitals:   08/27/15 0336 08/27/15 0400 08/27/15 0458 08/27/15 0825  BP:  122/71  114/74  Pulse:  105  99  Temp:  98 F (36.7 C)  97.8 F (36.6 C)  TempSrc:  Oral  Oral  Resp:  22  18  Height:      Weight:   126.2 kg (278 lb 3.5 oz)   SpO2: 96% 97%  94%    Intake/Output Summary (Last 24 hours) at 08/27/15 0835 Last data filed at 08/27/15 0600  Gross per 24 hour  Intake 1743.77 ml  Output   1750 ml  Net  -6.23 ml   Filed Weights   08/24/15 0500 08/26/15 1000  08/27/15 0458  Weight: 125.6 kg (276 lb 14.4 oz) 127.1 kg (280 lb 3.3 oz) 126.2 kg (278 lb 3.5 oz)   Tele: PAF, rate around 105  Exam:  General exam: comfortable. Talkative. Oriented and appropriate Respiratory system:  diminished throughout. No wheeze, rales or rhonchi Cardiovascular system: irreg irreg, no MGR Gastrointestinal system: Abdomen is nondistended, soft and nontender. Normal bowel sounds Extremities: trace edema. No clubbing or cyanosis   Data Reviewed: Basic Metabolic Panel:  Recent Labs Lab 08/21/15 0606 08/22/15 0555 08/23/15 0555 08/24/15 0553 08/25/15 0623 08/26/15 0510 08/27/15 0455  NA 145 144 140 140 143 141 141  K 4.0 3.9 3.6 4.6 4.1 4.3 4.3  CL 106 107 105 104 105 102 100*  CO2 28 28 27 27 29 31  33*  GLUCOSE 128* 152* 137* 197* 252* 223* 229*  BUN 23* 17 14 21* 40* 47* 52*  CREATININE 1.16 1.20 1.29* 1.40* 1.50* 1.33* 1.45*  CALCIUM 8.4* 8.1* 8.1* 8.2* 8.5* 8.4* 8.4*  MG 2.1 2.2 2.2  --   --   --   --   PHOS 3.4 3.2 3.0  --   --   --   --    Liver Function Tests: No results for input(s): AST, ALT, ALKPHOS, BILITOT, PROT, ALBUMIN in the last 168 hours. No results for input(s): LIPASE, AMYLASE in the last 168 hours. No results for input(s): AMMONIA in the last 168 hours. CBC:  Recent Labs Lab 08/21/15 0606 08/22/15 0555 08/23/15 0555 08/25/15 0623 08/27/15 0455  WBC 7.3 9.1 8.5 8.0 10.6*  HGB 10.9* 10.3* 10.1* 10.5* 10.8*  HCT 34.4* 32.9* 32.0* 31.9* 34.2*  MCV 91.5 92.2 91.7 90.6 91.0  PLT 169 156 171 217 252   Cardiac Enzymes: No results for input(s): CKTOTAL, CKMB, CKMBINDEX, TROPONINI in the last 168 hours. BNP (last 3 results) No results for input(s): PROBNP in the last 8760 hours. CBG:  Recent Labs Lab 08/25/15 2219 08/26/15 0816 08/26/15 1219 08/26/15 1703 08/26/15 2217  GLUCAP 283* 191* 305* 315* 248*    Recent Results (from the past 240 hour(s))  Culture, blood (routine x 2)     Status: None   Collection Time: 08/18/15  8:38 AM  Result Value Ref Range Status   Specimen Description BLOOD LEFT ARM  Final   Special Requests BOTTLES DRAWN AEROBIC AND ANAEROBIC 5CC  Final   Culture NO GROWTH 5 DAYS  Final   Report Status 08/23/2015 FINAL   Final  Urine culture     Status: None   Collection Time: 08/18/15  8:38 AM  Result Value Ref Range Status   Specimen Description URINE, CATHETERIZED  Final   Special Requests NONE  Final   Culture NO GROWTH 1 DAY  Final   Report Status 08/19/2015 FINAL  Final  Culture, blood (routine x 2)     Status: None   Collection Time: 08/18/15  9:00 AM  Result Value Ref Range Status   Specimen Description BLOOD RIGHT ARM  Final   Special Requests BOTTLES DRAWN AEROBIC AND ANAEROBIC 10CC  Final   Culture NO GROWTH 5 DAYS  Final   Report Status 08/23/2015 FINAL  Final  MRSA PCR Screening     Status: None   Collection Time: 08/18/15  1:23 PM  Result Value Ref Range Status   MRSA by PCR NEGATIVE NEGATIVE Final    Comment:        The GeneXpert MRSA Assay (FDA approved for NASAL specimens only), is one component of a  comprehensive MRSA colonization surveillance program. It is not intended to diagnose MRSA infection nor to guide or monitor treatment for MRSA infections.   Culture, respiratory (NON-Expectorated)     Status: None   Collection Time: 08/18/15  5:14 PM  Result Value Ref Range Status   Specimen Description TRACHEAL ASPIRATE  Final   Special Requests NONE  Final   Gram Stain   Final    FEW WBC PRESENT,BOTH PMN AND MONONUCLEAR RARE SQUAMOUS EPITHELIAL CELLS PRESENT NO ORGANISMS SEEN Performed at [facility]    Culture   Final    NO GROWTH 2 DAYS Performed at [facility]    Report Status 08/20/2015 FINAL  Final         Studies: No results found.      Scheduled Meds: . allopurinol  100 mg Oral BID  . aspirin  81 mg Oral Daily  . budesonide (PULMICORT) nebulizer solution  0.5 mg Nebulization BID  . carvedilol  6.25 mg Oral BID WC  . feeding supplement (ENSURE ENLIVE)  237 mL Oral BID BM  . feeding supplement (PRO-STAT SUGAR FREE 64)  30 mL Oral TID  . furosemide  40 mg Intravenous BID  . gabapentin  600 mg Oral TID  . insulin aspart  0-5  Units Subcutaneous QHS  . insulin aspart  0-9 Units Subcutaneous TID WC  . insulin glargine  15 Units Subcutaneous QHS  . ipratropium-albuterol  3 mL Nebulization Q4H  . pantoprazole  40 mg Oral Daily  . potassium chloride  10 mEq Oral BID  . predniSONE  40 mg Oral BID WC  . simvastatin  20 mg Oral Daily  . sodium chloride  3 mL Intravenous Q12H   Continuous Infusions: . heparin 1,700 Units/hr (08/26/15 2111)    Principal Problem:   Sepsis due to pneumonia [facility]) Active Problems:   Diabetes mellitus type 2, controlled ([place])   HTN (hypertension)   Hyperlipidemia   Diabetic neuropathy ([place])   Coronary artery disease involving native coronary artery of native heart without angina pectoris   Obesity   Obstructive sleep apnea   AKI (acute kidney injury) ([place])   Metabolic encephalopathy   Thrombocytopenia (HCC)   Acute respiratory failure with hypoxia (HCC)   Hard of hearing   Chronic sinus bradycardia   CAP (community acquired pneumonia)   Acute pulmonary edema (HCC)   Septic shock (HCC)   Acute respiratory failure with hypoxemia (HCC)   Systolic CHF, acute on chronic (HCC)   Prolonged QT interval   ARDS (adult respiratory distress syndrome) (HCC)   Renal failure, acute on chronic (HCC)   Acute systolic CHF (congestive heart failure) (HCC)   Persistent atrial fibrillation (HCC)   Severe sepsis ([place])    Time spent: 25 minutes    Delfina Redwood, MD [facility]  [email or web address] Password [email or web address] 08/27/2015, 8:35 AM    LOS: 9 days

## 2015-08-27 NOTE — Progress Notes (Signed)
Nutrition Follow-up  DOCUMENTATION CODES:   Obesity unspecified  INTERVENTION:    Continue Ensure Enlive po BID, each supplement provides 350 kcal and 20 grams of protein  NUTRITION DIAGNOSIS:   Inadequate oral intake related to lethargy/confusion as evidenced by meal completion < 50%, resolved  GOAL:   Patient will meet greater than or equal to 90% of their needs, met  MONITOR:   PO intake, Supplement acceptance, Diet advancement, Weight trends, Labs  ASSESSMENT:   53 yowm w/ complicated medical hx including CHF, prob COPD and OSA. Admitted 11/28 w/ CAP and Sepsis.   Patient's diet advanced to solids (Heart Healthy).  PO intake good at 100% per flowsheet records.  Receiving Ensure Enlive supplements BID.  COPD/asthma and sepsis improving.  Diet Order:  Diet heart healthy/carb modified Room service appropriate?: Yes; Fluid consistency:: Thin; Fluid restriction:: 1500 mL Fluid  Skin:  Reviewed, no issues  Last BM:  12/7  Height:   Ht Readings from Last 1 Encounters:  08/26/15 6' (1.829 m)   Weight:   Wt Readings from Last 1 Encounters:  08/27/15 278 lb 3.5 oz (126.2 kg)   Ideal Body Weight:  80.9 kg  BMI:  Body mass index is 37.73 kg/(m^2).  Estimated Nutritional Needs:   Kcal:  2000-2200  Protein:  100-115 grams  Fluid:  > 2 L/day  EDUCATION NEEDS:   No education needs identified at this time  Arthur Holms, RD, LDN Pager #: [phone] After-Hours Pager #: [phone]

## 2015-08-27 NOTE — Progress Notes (Signed)
PULMONARY / CRITICAL CARE MEDICINE   Name: Christopher Holder MRN: [id number] DOB: 02-04-1930    ADMISSION DATE:  08/18/2015 CONSULTATION DATE:  11/29 REFERRING MD :  Tyrell Antonio   CHIEF COMPLAINT:  Acute respiratory failure  BRIEF 81 year old gentleman recently moved here from [place], has a past medical history of CAD followed by Dr. Marlou Porch and history of asthma versus COPD currently being worked up by Dr. Lamonte Sakai  Also has h/o diabetes on oral medications, sleep apnea on Nocturnal CPAP, hypertension, diabetic neuropathy, obesity, remote CAD and dyslipidemia. Presents to the ER on 11/29 after 2 separate falls that morning. Upon EMS arrival patient was febrile with a temperature of 101.1 orally and relative hypotension with a BP of 97/37. Patient was given a gram of Tylenol and transported to the hospital. According to the patient's family he had been having diarrhea that morning and had strong smelling urine. In the ER he was given normal saline fluids and was somewhat disoriented and noted to be significantly hard of hearing. He was also found to have a small abrasion on his left knee. In the ER patient's rectal temperature was 102.4, respirations were 25, pulse was 60 and he was in sinus rhythm, BP was 92/44, O2 sats were 88% on room air so oxygen was applied and saturations of subsequently increased to 97%. He  received 3 L of fluid in the ER. He was admitted to the SDU w/ working dx of CAP and sepsis. Not long after arrival to the SDU he was found more confused and hypoxic w/ O2 sats in low 80s on 4 liters. PCCM was asked to eval given concern for impending respiratory arrest 08/18/15  SIGNIFICANT EVENTS: 11/29 intubated. ECHO ef 25% 12/1 extubated 12/2 confused.  - Awake and interactive, very hard of hearing. PCCM signed off. Moved to SDU  08/24/15 - PCCM asked to re-evalaute because of ongoing wheezing despite nebs and lasix. Cards has been called. RN at bedside reports mixture of extreme difficulty  hearing (aidds not working) and some mild hypoactive delirium. They are not able to get his hearing aid working. No fever or rising wbc - on chart freviewd. On Abx. CXR with bilateral diffused  Airspace dz - similar to yesterday but improved from admit  12/616 -cards has cut back on diuresis due to volume contraction on exam and labs. Still wheezing per cards but to me wheeze improved CT with multifocal pneumonia and small bilateral effusion - personally reviewed. On 4L [place] - 98%  SUBJECTIVE/OVERNIGHT/INTERVAL HX 08/27/15 - persistent new a fib since yesterday. On IV heparin. Cards planning cardioversion. Diureis being continued. HE reported to cards he is breathing better. OT recommending SNF - needs 24h assist   VITAL SIGNS: BP 114/74 mmHg  Pulse 99  Temp(Src) 97.8 F (36.6 C) (Oral)  Resp 18  Ht 6' (1.829 m)  Wt 126.2 kg (278 lb 3.5 oz)  BMI 37.73 kg/m2  SpO2 92%  HEMODYNAMICS:    VENTILATOR SETTINGS:  4L [place]  INTAKE / OUTPUT: I/O last 3 completed shifts: In: 1846.8 [P.O.:1454; I.V.:292.8; IV Piggyback:100] Out: 2750 [Urine:2750]  PHYSICAL EXAMINATION: General:  81 year old male, awake and interactive, moving all ext to command.  Looks deconditioned but improved since 08/24/15, 08/25/15 Neuro:  Awake, EXTREME HOH + but despite that hypoactive delirium + - likely resolved/improvedd 08/25/15 and 08/27/15 HEENT:  [place]/AT, no JVD, PERRL, EOM-I and MMM. No wheeze Cardiovascular:  rrr w/out MRG. Lungs:  Wheezing absent Abdomen:  Obese + bowel sounds. Musculoskeletal:  -  edema and -tenderness.  R leg warm to the touch. Skin:  Warm and dry.  LABS:  PULMONARY No results for input(s): PHART, PCO2ART, PO2ART, HCO3, TCO2, O2SAT in the last 168 hours.  Invalid input(s): PCO2, PO2  CBC  Recent Labs Lab 08/23/15 0555 08/25/15 0623 08/27/15 0455  HGB 10.1* 10.5* 10.8*  HCT 32.0* 31.9* 34.2*  WBC 8.5 8.0 10.6*  PLT 171 217 252    COAGULATION No results for input(s): INR in the  last 168 hours.  CARDIAC   No results for input(s): TROPONINI in the last 168 hours. No results for input(s): PROBNP in the last 168 hours.   CHEMISTRY  Recent Labs Lab 08/21/15 0606 08/22/15 0555 08/23/15 0555 08/24/15 0553 08/25/15 0623 08/26/15 0510 08/27/15 0455  NA 145 144 140 140 143 141 141  K 4.0 3.9 3.6 4.6 4.1 4.3 4.3  CL 106 107 105 104 105 102 100*  CO2 28 28 27 27 29 31  33*  GLUCOSE 128* 152* 137* 197* 252* 223* 229*  BUN 23* 17 14 21* 40* 47* 52*  CREATININE 1.16 1.20 1.29* 1.40* 1.50* 1.33* 1.45*  CALCIUM 8.4* 8.1* 8.1* 8.2* 8.5* 8.4* 8.4*  MG 2.1 2.2 2.2  --   --   --   --   PHOS 3.4 3.2 3.0  --   --   --   --    Estimated Creatinine Clearance: 53 mL/min (by C-G formula based on Cr of 1.45).   LIVER No results for input(s): AST, ALT, ALKPHOS, BILITOT, PROT, ALBUMIN, INR in the last 168 hours.   INFECTIOUS No results for input(s): LATICACIDVEN, PROCALCITON in the last 168 hours.   ENDOCRINE CBG (last 3)   Recent Labs  08/26/15 1219 08/26/15 1703 08/26/15 2217  GLUCAP 305* 315* 248*         IMAGING x48h  - image(s) personally visualized  -   highlighted in bold No results found.     DISCUSSION: 54 yowm w/ complicated medical hx including CHF, prob COPD and OSA. Admitted 11/28 w/ CAP and Sepsis. Intubated1229, Extubated 08/20/15 and move to SDU 08/21/15. PCCM reconsutled 08/24/15 for wheezing + persistent bilateral air space dz. Has ongoing deliruim. Has adquate ID rx and no evidence of active infection REcommend diuresis + BiPAP + CT chest  ASSESSMENT / PLAN:  PULMONARY A: Acute hypoxic respiratory failure in setting of CAP c/b either evolving ALI vs pulmonary edema vs mix of both   - improved but not fully resolved. Has ongoing wheeze despite extubatino 08/20/15 and cxr findings but has further improvement 08/25/15 following BiPAP and lasix on 08/24/15.  Furhter improved 08/27/15. Pulse ox 95% on 4L [place]. No CXR in 2 days   P:   BiPAP  to give positiv pressure esp QHS  Repeat CXR Continue prednisone O2 for pulse ox > 88% LASIX - per cards Nebs Q4h   CARDIOVASCULAR A:  Has hx of ch ronic systolic chf Might have acute on chronic systolic chf Long QTc on ekg 08/23/15 NEw A Fib on IV heparin since 08/26/15 P:  Cards consult - DCV pending  RENAL A:   AKI  Due to pneumnia at ad mit  Worse again 08/27/2015 with lasix  P:   Renal dose meds. Strict I&O. F/u chemistry in am. Replace electrolytes as indicated. Monitor with lasix  GASTROINTESTINAL A:   Obesity  H/o GERD P:   PPI. Heart healthy diet.  HEMATOLOGIC A:   Anemia  Mild thrombocytopenia  P:  LMWH  Trend cbc Transfuse per typical protocol   INFECTIOUS   CULTURES: Sputum 11/29>>>NTD BCX2 11/29>>>NTD U strep 11/29>>>Neg U legionella 11/29>>Neg Influenza panel 11/29: neg   A:   Probable CAP Sepsis  Concern for right leg cellulitis P:   STOP at total 8 day abx.  Anti-infectives    Start     Dose/Rate Route Frequency Ordered Stop   08/21/15 1400  cefTAZidime (FORTAZ) 2 g in dextrose 5 % 50 mL IVPB  Status:  Discontinued     2 g 100 mL/hr over 30 Minutes Intravenous 3 times per day 08/21/15 1255 08/26/15 0844   08/21/15 1230  azithromycin (ZITHROMAX) 500 mg in dextrose 5 % 250 mL IVPB  Status:  Discontinued     500 mg 250 mL/hr over 60 Minutes Intravenous Every 24 hours 08/21/15 1217 08/24/15 1146   08/21/15 1200  azithromycin (ZITHROMAX) tablet 500 mg  Status:  Discontinued     500 mg Oral Daily 08/21/15 1026 08/21/15 1217   08/18/15 1200  azithromycin (ZITHROMAX) 500 mg in dextrose 5 % 250 mL IVPB  Status:  Discontinued     500 mg 250 mL/hr over 60 Minutes Intravenous Every 24 hours 08/18/15 1131 08/21/15 1026   08/18/15 1200  cefTRIAXone (ROCEPHIN) 1 g in dextrose 5 % 50 mL IVPB  Status:  Discontinued     1 g 100 mL/hr over 30 Minutes Intravenous Every 24 hours 08/18/15 1131 08/21/15 1255   08/18/15 1045  vancomycin (VANCOCIN)  2,000 mg in sodium chloride 0.9 % 500 mL IVPB  Status:  Discontinued     2,000 mg 250 mL/hr over 120 Minutes Intravenous  Once 08/18/15 1031 08/18/15 1130   08/18/15 1045  piperacillin-tazobactam (ZOSYN) IVPB 3.375 g     3.375 g 100 mL/hr over 30 Minutes Intravenous  Once 08/18/15 1031 08/18/15 1113     ENDOCRINE A:   DM w/ hyperglycemia  P:   SSI protocol   NEUROLOGIC A:   Acute Encephalopathy in setting of sepsis and hypoxia. HYPOACTIVE delirium - mild 08/24/15. Improved 08/25/15 with his hearing aid. Probably resolved 08/27/2015   P:   Hold off all sedation. Treat sepsis. Await QTC correction - then try haldol  FAMILY  - Updates: none at bedside 08/27/2015   - Inter-disciplinary family meet or Palliative Care meeting due by:  12/6   GLOBAL AIm to move to floor /LTAC . Slow medical recovery anticipated. Needs rehab/SNF  PCCM will see again 09/02/15. Call sooner if needed. Will round on chart 08/28/15     Dr. Brand Males, M.D., [facility].C.P Pulmonary and Critical Care Medicine Staff Physician [facility] Pager: [phone], If no answer or between  15:00h - 7:00h: call [phone]  08/27/2015 11:33 AM

## 2015-08-27 NOTE — Progress Notes (Signed)
Pharmacist Heart Failure Core Measure Documentation  Assessment: Christopher Holder has an EF documented as 25-30% on 08/21/15 by ECHO.  Rationale: Heart failure patients with left ventricular systolic dysfunction (LVSD) and an EF < 40% should be prescribed an angiotensin converting enzyme inhibitor (ACEI) or angiotensin receptor blocker (ARB) at discharge unless a contraindication is documented in the medical record.  This patient is not currently on an ACEI or ARB for HF.  This note is being placed in the record in order to provide documentation that a contraindication to the use of these agents is present for this encounter.  ACE Inhibitor or Angiotensin Receptor Blocker is contraindicated (specify all that apply)  []   ACEI allergy AND ARB allergy []   Angioedema []   Moderate or severe aortic stenosis []   Hyperkalemia []   Hypotension []   Renal artery stenosis [x]   Worsening renal function, preexisting renal disease or dysfunction   Lawson Radar 08/27/2015 2:23 PM

## 2015-08-27 NOTE — Progress Notes (Signed)
Occupational Therapy Treatment Patient Details Name: Christopher Holder MRN: [id number] DOB: Jun 29, 1930 Today's Date: 08/27/2015    History of present illness pt presents with PNA and Sepsis.  pt with hx of CAD, COPD, HTN, DM, Neuropathies, and Hearing Loss.   OT comments  This 81 yo male admitted with above presents to acute OT with need need for increased O2 with ambulation to keep O2 sats up in 90's and also pt with increased HR (up to 143--had to have pt stop, sit, and rest (x2) to get it to come back down). Pt moving at a minguard to min A level. He will continue to benefit from acute OT with follow up at SNF.  Follow Up Recommendations  SNF;Supervision/Assistance - 24 hour    Equipment Recommendations   (TBD next venue)       Precautions / Restrictions Precautions Precautions: Fall Precaution Comments: pt very HOH even with Bil Hearing Aides.  Monitor HR and O2 sats Restrictions Weight Bearing Restrictions: No       Mobility Bed Mobility               General bed mobility comments: Pt up in recliner upon arrival  Transfers Overall transfer level: Needs assistance Equipment used: Rolling walker (2 wheeled) Transfers: Sit to/from Stand Sit to Stand: Min guard         General transfer comment: Pt total A +2 min A (for lines) to ambulate with RW 300 feet     Balance Overall balance assessment: Needs assistance Sitting-balance support: No upper extremity supported;Feet supported Sitting balance-Leahy Scale: Good     Standing balance support: Bilateral upper extremity supported Standing balance-Leahy Scale: Poor Standing balance comment: reliant on RW                   ADL Overall ADL's : Needs assistance/impaired                     Lower Body Dressing: Total assistance (to donn socks seated in recliner (he reports that he normally donns them at home by turning sideways on bed and throwing his leg up on bed)   Toilet Transfer: Min  guard;Ambulation;BSC   Toileting- Clothing Manipulation and Hygiene: Moderate assistance (min guard A sit<>stand)                          Cognition   Behavior During Therapy: WFL for tasks assessed/performed Overall Cognitive Status: Within Functional Limits for tasks assessed                  General Comments: Pt did however report that he got himself from bed to recliner today (locking the wheel closest to him, but not the one further away) as well as his lines were rather tangled up when I arrived at room                 Pertinent Vitals/ Pain       Pain Assessment: No/denies pain         Frequency Min 2X/week     Progress Toward Goals  OT Goals(current goals can now be found in the care plan section)  Progress towards OT goals: Progressing toward goals     Plan Discharge plan remains appropriate       End of Session Equipment Utilized During Treatment: Gait belt;Rolling walker;Oxygen (O2 started at 4 liters, turned up to 6 for ambulation, and back to 4 at end  of session)   Activity Tolerance Patient tolerated treatment well   Patient Left in chair;with call bell/phone within reach;with chair alarm set   Nurse Communication          Time: [id number] OT Time Calculation (min): 24 min  Charges: OT General Charges $OT Visit: 1 Procedure OT Treatments $Self Care/Home Management : 23-37 mins  Almon Register [id number] 08/27/2015, 11:09 AM

## 2015-08-27 NOTE — Progress Notes (Signed)
  DAILY PROGRESS NOTE  Subjective:  Persistent a-fib - CHADSVASC of 5.  States breathing is better today. Creatinine climbed slightly with additional diuresis. BNP elevated at 1054. BUN now 52 - attributed to steroids. On IV heparin. Diuresed about 1.6L negative overnight.  Objective:  Temp:  [97.8 F (36.6 C)-98.1 F (36.7 C)] 97.8 F (36.6 C) (12/08 0825) Pulse Rate:  [69-105] 99 (12/08 0825) Resp:  [14-28] 18 (12/08 0825) BP: (106-148)/(51-84) 114/74 mmHg (12/08 0825) SpO2:  [92 %-97 %] 92 % (12/08 0846) Weight:  [278 lb 3.5 oz (126.2 kg)-280 lb 3.3 oz (127.1 kg)] 278 lb 3.5 oz (126.2 kg) (12/08 0458) Weight change:   Intake/Output from previous day: 12/07 0701 - 12/08 0700 In: 1743.8 [P.O.:1454; I.V.:289.8] Out: 2225 [Urine:2225]  Intake/Output from this shift:    Medications: Current Facility-Administered Medications  Medication Dose Route Frequency Provider Last Rate Last Dose  . albuterol (PROVENTIL) (2.5 MG/3ML) 0.083% nebulizer solution 3 mL  3 mL Inhalation Q2H PRN Thomas Michael Callahan, NP   3 mL at 08/24/15 1744  . allopurinol (ZYLOPRIM) tablet 100 mg  100 mg Oral BID Anand D Hongalgi, MD   100 mg at 08/26/15 2121  . aspirin chewable tablet 81 mg  81 mg Oral Daily Anand D Hongalgi, MD   81 mg at 08/26/15 1007  . budesonide (PULMICORT) nebulizer solution 0.5 mg  0.5 mg Nebulization BID Peter E Babcock, NP   0.5 mg at 08/27/15 0844  . carvedilol (COREG) tablet 6.25 mg  6.25 mg Oral BID WC Anand D Hongalgi, MD   6.25 mg at 08/26/15 1739  . feeding supplement (ENSURE ENLIVE) (ENSURE ENLIVE) liquid 237 mL  237 mL Oral BID BM Heather C Pitts, RD   237 mL at 08/26/15 1403  . feeding supplement (PRO-STAT SUGAR FREE 64) liquid 30 mL  30 mL Oral TID Anand D Hongalgi, MD   30 mL at 08/26/15 2121  . furosemide (LASIX) injection 40 mg  40 mg Intravenous BID Corinna L Sullivan, MD   40 mg at 08/26/15 1740  . gabapentin (NEURONTIN) tablet 600 mg  600 mg Oral TID Anand D  Hongalgi, MD   600 mg at 08/26/15 2121  . heparin ADULT infusion 100 units/mL (25000 units/250 mL)  1,700 Units/hr Intravenous Continuous Thuy D Dang, RPH 17 mL/hr at 08/26/15 2111 1,700 Units/hr at 08/26/15 2111  . insulin aspart (novoLOG) injection 0-5 Units  0-5 Units Subcutaneous QHS Anand D Hongalgi, MD   2 Units at 08/26/15 2227  . insulin aspart (novoLOG) injection 0-9 Units  0-9 Units Subcutaneous TID WC Anand D Hongalgi, MD   7 Units at 08/26/15 1741  . insulin glargine (LANTUS) injection 20 Units  20 Units Subcutaneous QHS Corinna L Sullivan, MD      . ipratropium-albuterol (DUONEB) 0.5-2.5 (3) MG/3ML nebulizer solution 3 mL  3 mL Nebulization Q4H Murali Ramaswamy, MD   3 mL at 08/27/15 0843  . pantoprazole (PROTONIX) EC tablet 40 mg  40 mg Oral Daily Alison M Masters, RPH   40 mg at 08/26/15 1007  . potassium chloride (K-DUR,KLOR-CON) CR tablet 10 mEq  10 mEq Oral BID Anand D Hongalgi, MD   10 mEq at 08/26/15 2122  . [START ON 08/28/2015] predniSONE (DELTASONE) tablet 30 mg  30 mg Oral Q breakfast Corinna L Sullivan, MD      . simvastatin (ZOCOR) tablet 20 mg  20 mg Oral Daily Anand D Hongalgi, MD   20 mg at 08/26/15 1007  .   sodium chloride 0.9 % injection 3 mL  3 mL Intravenous Q12H Samella Parr, NP   3 mL at 08/26/15 2123    Physical Exam: General appearance: alert, no distress and moderately obese Neck: JVD - 1 cm above sternal notch and no carotid bruit Lungs: diminished breath sounds bilaterally, rales bilaterally and wheezes bilaterally Heart: irregularly irregular rhythm Abdomen: soft, non-tender; bowel sounds normal; no masses,  no organomegaly Extremities: extremities normal, atraumatic, no cyanosis or edema Pulses: 2+ and symmetric Skin: Skin color, texture, turgor normal. No rashes or lesions Neurologic: Grossly normal PSych: Pleasant  Lab Results: Results for orders placed or performed during the hospital encounter of 08/18/15 (from the past 48 hour(s))  Glucose,  capillary     Status: Abnormal   Collection Time: 08/25/15 12:39 PM  Result Value Ref Range   Glucose-Capillary 276 (H) 65 - 99 mg/dL  Glucose, capillary     Status: Abnormal   Collection Time: 08/25/15  4:00 PM  Result Value Ref Range   Glucose-Capillary 269 (H) 65 - 99 mg/dL  Glucose, capillary     Status: Abnormal   Collection Time: 08/25/15 10:19 PM  Result Value Ref Range   Glucose-Capillary 283 (H) 65 - 99 mg/dL  Brain natriuretic peptide     Status: Abnormal   Collection Time: 08/26/15  5:10 AM  Result Value Ref Range   B Natriuretic Peptide 1054.2 (H) 0.0 - 100.0 pg/mL  Basic metabolic panel     Status: Abnormal   Collection Time: 08/26/15  5:10 AM  Result Value Ref Range   Sodium 141 135 - 145 mmol/L   Potassium 4.3 3.5 - 5.1 mmol/L   Chloride 102 101 - 111 mmol/L   CO2 31 22 - 32 mmol/L   Glucose, Bld 223 (H) 65 - 99 mg/dL   BUN 47 (H) 6 - 20 mg/dL   Creatinine, Ser 1.33 (H) 0.61 - 1.24 mg/dL   Calcium 8.4 (L) 8.9 - 10.3 mg/dL   GFR calc non Af Amer 48 (L) >60 mL/min   GFR calc Af Amer 55 (L) >60 mL/min    Comment: (NOTE) The eGFR has been calculated using the CKD EPI equation. This calculation has not been validated in all clinical situations. eGFR's persistently <60 mL/min signify possible Chronic Kidney Disease.    Anion gap 8 5 - 15  Glucose, capillary     Status: Abnormal   Collection Time: 08/26/15  8:16 AM  Result Value Ref Range   Glucose-Capillary 191 (H) 65 - 99 mg/dL  Glucose, capillary     Status: Abnormal   Collection Time: 08/26/15 12:19 PM  Result Value Ref Range   Glucose-Capillary 305 (H) 65 - 99 mg/dL  Glucose, capillary     Status: Abnormal   Collection Time: 08/26/15  5:03 PM  Result Value Ref Range   Glucose-Capillary 315 (H) 65 - 99 mg/dL  Heparin level (unfractionated)     Status: Abnormal   Collection Time: 08/26/15  6:50 PM  Result Value Ref Range   Heparin Unfractionated 0.26 (L) 0.30 - 0.70 IU/mL    Comment:        IF HEPARIN  RESULTS ARE BELOW EXPECTED VALUES, AND PATIENT DOSAGE HAS BEEN CONFIRMED, SUGGEST FOLLOW UP TESTING OF ANTITHROMBIN III LEVELS.   Glucose, capillary     Status: Abnormal   Collection Time: 08/26/15 10:17 PM  Result Value Ref Range   Glucose-Capillary 248 (H) 65 - 99 mg/dL  Basic metabolic panel  Status: Abnormal   Collection Time: 08/27/15  4:55 AM  Result Value Ref Range   Sodium 141 135 - 145 mmol/L   Potassium 4.3 3.5 - 5.1 mmol/L   Chloride 100 (L) 101 - 111 mmol/L   CO2 33 (H) 22 - 32 mmol/L   Glucose, Bld 229 (H) 65 - 99 mg/dL   BUN 52 (H) 6 - 20 mg/dL   Creatinine, Ser 1.45 (H) 0.61 - 1.24 mg/dL   Calcium 8.4 (L) 8.9 - 10.3 mg/dL   GFR calc non Af Amer 43 (L) >60 mL/min   GFR calc Af Amer 50 (L) >60 mL/min    Comment: (NOTE) The eGFR has been calculated using the CKD EPI equation. This calculation has not been validated in all clinical situations. eGFR's persistently <60 mL/min signify possible Chronic Kidney Disease.    Anion gap 8 5 - 15  Heparin level (unfractionated)     Status: None   Collection Time: 08/27/15  4:55 AM  Result Value Ref Range   Heparin Unfractionated 0.41 0.30 - 0.70 IU/mL    Comment:        IF HEPARIN RESULTS ARE BELOW EXPECTED VALUES, AND PATIENT DOSAGE HAS BEEN CONFIRMED, SUGGEST FOLLOW UP TESTING OF ANTITHROMBIN III LEVELS.   CBC     Status: Abnormal   Collection Time: 08/27/15  4:55 AM  Result Value Ref Range   WBC 10.6 (H) 4.0 - 10.5 K/uL   RBC 3.76 (L) 4.22 - 5.81 MIL/uL   Hemoglobin 10.8 (L) 13.0 - 17.0 g/dL   HCT 34.2 (L) 39.0 - 52.0 %   MCV 91.0 78.0 - 100.0 fL   MCH 28.7 26.0 - 34.0 pg   MCHC 31.6 30.0 - 36.0 g/dL   RDW 15.7 (H) 11.5 - 15.5 %   Platelets 252 150 - 400 K/uL    Imaging: No results found.  Assessment:  Principal Problem:   Sepsis due to pneumonia (HCC) Active Problems:   Diabetes mellitus type 2, controlled (HCC)   HTN (hypertension)   Hyperlipidemia   Diabetic neuropathy (HCC)   Coronary  artery disease involving native coronary artery of native heart without angina pectoris   Obesity   Obstructive sleep apnea   AKI (acute kidney injury) (HCC)   Metabolic encephalopathy   Thrombocytopenia (HCC)   Acute respiratory failure with hypoxia (HCC)   Hard of hearing   Chronic sinus bradycardia   CAP (community acquired pneumonia)   Acute pulmonary edema (HCC)   Septic shock (HCC)   Acute respiratory failure with hypoxemia (HCC)   Systolic CHF, acute on chronic (HCC)   Prolonged QT interval   ARDS (adult respiratory distress syndrome) (HCC)   Renal failure, acute on chronic (HCC)   Acute systolic CHF (congestive heart failure) (HCC)   Persistent atrial fibrillation (HCC)   Severe sepsis (HCC)   Plan:  New a-fib yesterday which has been persistent - also with PVC's on telemetry. ON IV heparin - plan to likely change to DOAC prior to discharge. No room on schedule for cardioversion today. Will optimize further and try to arrange for cardioversion tomorrow.  Continue diuresis again today - creatinine is trending up, but may be around baseline.  Time Spent Directly with Patient:  15 minutes  Length of Stay:  LOS: 9 days    C. , MD, FACC Attending Cardiologist [facility]   C  08/27/2015, 8:56 AM     

## 2015-08-27 NOTE — Progress Notes (Signed)
ANTICOAGULATION CONSULT NOTE - Follow Up Consult  Pharmacy Consult for heparin Indication: atrial fibrillation   Labs:  Recent Labs  08/24/15 0553 08/25/15 0623 08/26/15 0510 08/26/15 1850 08/27/15 0455  HGB  --  10.5*  --   --  10.8*  HCT  --  31.9*  --   --  34.2*  PLT  --  217  --   --  252  HEPARINUNFRC  --   --   --  0.26* 0.41  CREATININE 1.40* 1.50* 1.33*  --   --      Assessment/Plan: 81yo male now therapeutic on heparin after rate change.  Will continue gtt at current rate and confirm stable with additional level.  Wynona Neat, PharmD, BCPS  08/27/2015,5:34 AM

## 2015-08-28 ENCOUNTER — Inpatient Hospital Stay (HOSPITAL_COMMUNITY): Payer: Medicare Other | Admitting: Certified Registered Nurse Anesthetist

## 2015-08-28 ENCOUNTER — Encounter (HOSPITAL_COMMUNITY): Payer: Self-pay | Admitting: Certified Registered Nurse Anesthetist

## 2015-08-28 ENCOUNTER — Encounter (HOSPITAL_COMMUNITY)
Admission: EM | Disposition: A | Discharge status: Skilled Nursing Facility | Payer: Self-pay | Source: Home / Self Care | Attending: Internal Medicine

## 2015-08-28 HISTORY — PX: CARDIOVERSION: SHX1299

## 2015-08-28 LAB — GLUCOSE, CAPILLARY
GLUCOSE-CAPILLARY: 102 mg/dL — AB (ref 65–99)
GLUCOSE-CAPILLARY: 99 mg/dL (ref 65–99)
GLUCOSE-CAPILLARY: 137 mg/dL — AB (ref 65–99)
Glucose-Capillary: 161 mg/dL — ABNORMAL HIGH (ref 65–99)
Glucose-Capillary: 238 mg/dL — ABNORMAL HIGH (ref 65–99)

## 2015-08-28 LAB — BASIC METABOLIC PANEL
ANION GAP: 7 (ref 5–15)
BUN: 50 mg/dL — AB (ref 6–20)
CO2: 35 mmol/L — AB (ref 22–32)
Calcium: 8.3 mg/dL — ABNORMAL LOW (ref 8.9–10.3)
Chloride: 102 mmol/L (ref 101–111)
Creatinine, Ser: 1.32 mg/dL — ABNORMAL HIGH (ref 0.61–1.24)
GFR calc Af Amer: 56 mL/min — ABNORMAL LOW (ref >60)
GFR, EST NON AFRICAN AMERICAN: 48 mL/min — AB (ref >60)
GLUCOSE: 133 mg/dL — AB (ref 65–99)
POTASSIUM: 4.1 mmol/L (ref 3.5–5.1)
Sodium: 144 mmol/L (ref 135–145)

## 2015-08-28 LAB — CBC
HEMATOCRIT: 33.5 % — AB (ref 39.0–52.0)
Hemoglobin: 10.8 g/dL — ABNORMAL LOW (ref 13.0–17.0)
MCH: 29.7 pg (ref 26.0–34.0)
MCHC: 32.2 g/dL (ref 30.0–36.0)
MCV: 92 fL (ref 78.0–100.0)
PLATELETS: 263 10*3/uL (ref 150–400)
RBC: 3.64 MIL/uL — AB (ref 4.22–5.81)
RDW: 16 % — ABNORMAL HIGH (ref 11.5–15.5)
WBC: 10.2 10*3/uL (ref 4.0–10.5)

## 2015-08-28 LAB — PHOSPHORUS: Phosphorus: 3.4 mg/dL (ref 2.5–4.6)

## 2015-08-28 LAB — HEPARIN LEVEL (UNFRACTIONATED): Heparin Unfractionated: 0.54 IU/mL (ref 0.30–0.70)

## 2015-08-28 LAB — MAGNESIUM: Magnesium: 2.6 mg/dL — ABNORMAL HIGH (ref 1.7–2.4)

## 2015-08-28 SURGERY — CARDIOVERSION
Anesthesia: General

## 2015-08-28 MED ORDER — LEVALBUTEROL HCL 1.25 MG/0.5ML IN NEBU
1.2500 mg | INHALATION_SOLUTION | Freq: Once | RESPIRATORY_TRACT | Status: AC
  Administered 2015-08-28: 1.25 mg via RESPIRATORY_TRACT
  Filled 2015-08-28: qty 0.5

## 2015-08-28 MED ORDER — SODIUM CHLORIDE 0.9 % IV SOLN: 250.0000 mL | INTRAVENOUS | Status: DC

## 2015-08-28 MED ORDER — SODIUM CHLORIDE 0.9 % IJ SOLN: 3.0000 mL | INTRAMUSCULAR | Status: DC | PRN

## 2015-08-28 MED ORDER — LIDOCAINE HCL (CARDIAC) 20 MG/ML IV SOLN
INTRAVENOUS | Status: DC | PRN
  Administered 2015-08-28: 60 mg via INTRAVENOUS

## 2015-08-28 MED ORDER — SODIUM CHLORIDE 0.9 % IJ SOLN
3.0000 mL | Freq: Two times a day (BID) | INTRAMUSCULAR | Status: DC
  Administered 2015-08-29 – 2015-09-01 (×7): 3 mL via INTRAVENOUS

## 2015-08-28 MED ORDER — APIXABAN 5 MG PO TABS
5.0000 mg | ORAL_TABLET | Freq: Two times a day (BID) | ORAL | Status: DC
  Administered 2015-08-28 – 2015-09-01 (×8): 5 mg via ORAL
  Filled 2015-08-28 (×8): qty 1

## 2015-08-28 MED ORDER — PROPOFOL 10 MG/ML IV BOLUS
INTRAVENOUS | Status: DC | PRN
  Administered 2015-08-28: 50 mg via INTRAVENOUS

## 2015-08-28 MED ORDER — PREDNISONE 20 MG PO TABS
20.0000 mg | ORAL_TABLET | Freq: Every day | ORAL | Status: DC
Start: 2015-08-29 — End: 2015-08-29
  Administered 2015-08-29: 20 mg via ORAL
  Filled 2015-08-28: qty 1

## 2015-08-28 MED ORDER — INSULIN GLARGINE 100 UNIT/ML ~~LOC~~ SOLN
16.0000 [IU] | Freq: Every day | SUBCUTANEOUS | Status: DC
  Administered 2015-08-28: 16 [IU] via SUBCUTANEOUS
  Filled 2015-08-28 (×2): qty 0.16

## 2015-08-28 MED ORDER — SODIUM CHLORIDE 0.9 % IV SOLN
INTRAVENOUS | Status: DC | PRN
  Administered 2015-08-28: 10:00:00 via INTRAVENOUS

## 2015-08-28 NOTE — Progress Notes (Addendum)
Pt dtr chooses [facility]- they are able to accept if pt is ready over the weekend.  [employer] auth received: [id number]  CSW will continue to follow  Domenica Reamer, [place] Social Worker [phone]

## 2015-08-28 NOTE — Anesthesia Postprocedure Evaluation (Signed)
Anesthesia Post Note  Patient: Roczen Koehnen  Procedure(s) Performed: Procedure(s) (LRB): CARDIOVERSION ([name])  Patient location during evaluation: PACU Anesthesia Type: General Level of consciousness: awake and alert Pain management: pain level controlled Vital Signs Assessment: post-procedure vital signs reviewed and stable Respiratory status: spontaneous breathing, nonlabored ventilation and respiratory function stable Cardiovascular status: blood pressure returned to baseline and stable Postop Assessment: no signs of nausea or vomiting Anesthetic complications: no    Last Vitals:  Filed Vitals:   08/28/15 1024 08/28/15 1025  BP: 97/54   Pulse:  49  Temp:    Resp: 15 13    Last Pain:  Filed Vitals:   08/28/15 1028  PainSc: 0-No pain                 Camala Talwar,W. EDMOND

## 2015-08-28 NOTE — H&P (View-Only) (Signed)
  DAILY PROGRESS NOTE  Subjective:  Persistent a-fib - CHADSVASC of 5.  States breathing is better today. Creatinine climbed slightly with additional diuresis. BNP elevated at 1054. BUN now 52 - attributed to steroids. On IV heparin. Diuresed about 1.6L negative overnight.  Objective:  Temp:  [97.8 F (36.6 C)-98.1 F (36.7 C)] 97.8 F (36.6 C) (12/08 0825) Pulse Rate:  [69-105] 99 (12/08 0825) Resp:  [14-28] 18 (12/08 0825) BP: (106-148)/(51-84) 114/74 mmHg (12/08 0825) SpO2:  [92 %-97 %] 92 % (12/08 0846) Weight:  [278 lb 3.5 oz (126.2 kg)-280 lb 3.3 oz (127.1 kg)] 278 lb 3.5 oz (126.2 kg) (12/08 0458) Weight change:   Intake/Output from previous day: 12/07 0701 - 12/08 0700 In: 1743.8 [P.O.:1454; I.V.:289.8] Out: 2225 [Urine:2225]  Intake/Output from this shift:    Medications: Current Facility-Administered Medications  Medication Dose Route Frequency Provider Last Rate Last Dose  . albuterol (PROVENTIL) (2.5 MG/3ML) 0.083% nebulizer solution 3 mL  3 mL Inhalation Q2H PRN Thomas Michael Callahan, NP   3 mL at 08/24/15 1744  . allopurinol (ZYLOPRIM) tablet 100 mg  100 mg Oral BID Anand D Hongalgi, MD   100 mg at 08/26/15 2121  . aspirin chewable tablet 81 mg  81 mg Oral Daily Anand D Hongalgi, MD   81 mg at 08/26/15 1007  . budesonide (PULMICORT) nebulizer solution 0.5 mg  0.5 mg Nebulization BID Peter E Babcock, NP   0.5 mg at 08/27/15 0844  . carvedilol (COREG) tablet 6.25 mg  6.25 mg Oral BID WC Anand D Hongalgi, MD   6.25 mg at 08/26/15 1739  . feeding supplement (ENSURE ENLIVE) (ENSURE ENLIVE) liquid 237 mL  237 mL Oral BID BM Heather C Pitts, RD   237 mL at 08/26/15 1403  . feeding supplement (PRO-STAT SUGAR FREE 64) liquid 30 mL  30 mL Oral TID Anand D Hongalgi, MD   30 mL at 08/26/15 2121  . furosemide (LASIX) injection 40 mg  40 mg Intravenous BID Corinna L Sullivan, MD   40 mg at 08/26/15 1740  . gabapentin (NEURONTIN) tablet 600 mg  600 mg Oral TID Anand D  Hongalgi, MD   600 mg at 08/26/15 2121  . heparin ADULT infusion 100 units/mL (25000 units/250 mL)  1,700 Units/hr Intravenous Continuous Thuy D Dang, RPH 17 mL/hr at 08/26/15 2111 1,700 Units/hr at 08/26/15 2111  . insulin aspart (novoLOG) injection 0-5 Units  0-5 Units Subcutaneous QHS Anand D Hongalgi, MD   2 Units at 08/26/15 2227  . insulin aspart (novoLOG) injection 0-9 Units  0-9 Units Subcutaneous TID WC Anand D Hongalgi, MD   7 Units at 08/26/15 1741  . insulin glargine (LANTUS) injection 20 Units  20 Units Subcutaneous QHS Corinna L Sullivan, MD      . ipratropium-albuterol (DUONEB) 0.5-2.5 (3) MG/3ML nebulizer solution 3 mL  3 mL Nebulization Q4H Murali Ramaswamy, MD   3 mL at 08/27/15 0843  . pantoprazole (PROTONIX) EC tablet 40 mg  40 mg Oral Daily Alison M Masters, RPH   40 mg at 08/26/15 1007  . potassium chloride (K-DUR,KLOR-CON) CR tablet 10 mEq  10 mEq Oral BID Anand D Hongalgi, MD   10 mEq at 08/26/15 2122  . [START ON 08/28/2015] predniSONE (DELTASONE) tablet 30 mg  30 mg Oral Q breakfast Corinna L Sullivan, MD      . simvastatin (ZOCOR) tablet 20 mg  20 mg Oral Daily Anand D Hongalgi, MD   20 mg at 08/26/15 1007  .   sodium chloride 0.9 % injection 3 mL  3 mL Intravenous Q12H Allison L Ellis, NP   3 mL at 08/26/15 2123    Physical Exam: General appearance: alert, no distress and moderately obese Neck: JVD - 1 cm above sternal notch and no carotid bruit Lungs: diminished breath sounds bilaterally, rales bilaterally and wheezes bilaterally Heart: irregularly irregular rhythm Abdomen: soft, non-tender; bowel sounds normal; no masses,  no organomegaly Extremities: extremities normal, atraumatic, no cyanosis or edema Pulses: 2+ and symmetric Skin: Skin color, texture, turgor normal. No rashes or lesions Neurologic: Grossly normal PSych: Pleasant  Lab Results: Results for orders placed or performed during the hospital encounter of 08/18/15 (from the past 48 hour(s))  Glucose,  capillary     Status: Abnormal   Collection Time: 08/25/15 12:39 PM  Result Value Ref Range   Glucose-Capillary 276 (H) 65 - 99 mg/dL  Glucose, capillary     Status: Abnormal   Collection Time: 08/25/15  4:00 PM  Result Value Ref Range   Glucose-Capillary 269 (H) 65 - 99 mg/dL  Glucose, capillary     Status: Abnormal   Collection Time: 08/25/15 10:19 PM  Result Value Ref Range   Glucose-Capillary 283 (H) 65 - 99 mg/dL  Brain natriuretic peptide     Status: Abnormal   Collection Time: 08/26/15  5:10 AM  Result Value Ref Range   B Natriuretic Peptide 1054.2 (H) 0.0 - 100.0 pg/mL  Basic metabolic panel     Status: Abnormal   Collection Time: 08/26/15  5:10 AM  Result Value Ref Range   Sodium 141 135 - 145 mmol/L   Potassium 4.3 3.5 - 5.1 mmol/L   Chloride 102 101 - 111 mmol/L   CO2 31 22 - 32 mmol/L   Glucose, Bld 223 (H) 65 - 99 mg/dL   BUN 47 (H) 6 - 20 mg/dL   Creatinine, Ser 1.33 (H) 0.61 - 1.24 mg/dL   Calcium 8.4 (L) 8.9 - 10.3 mg/dL   GFR calc non Af Amer 48 (L) >60 mL/min   GFR calc Af Amer 55 (L) >60 mL/min    Comment: (NOTE) The eGFR has been calculated using the CKD EPI equation. This calculation has not been validated in all clinical situations. eGFR's persistently <60 mL/min signify possible Chronic Kidney Disease.    Anion gap 8 5 - 15  Glucose, capillary     Status: Abnormal   Collection Time: 08/26/15  8:16 AM  Result Value Ref Range   Glucose-Capillary 191 (H) 65 - 99 mg/dL  Glucose, capillary     Status: Abnormal   Collection Time: 08/26/15 12:19 PM  Result Value Ref Range   Glucose-Capillary 305 (H) 65 - 99 mg/dL  Glucose, capillary     Status: Abnormal   Collection Time: 08/26/15  5:03 PM  Result Value Ref Range   Glucose-Capillary 315 (H) 65 - 99 mg/dL  Heparin level (unfractionated)     Status: Abnormal   Collection Time: 08/26/15  6:50 PM  Result Value Ref Range   Heparin Unfractionated 0.26 (L) 0.30 - 0.70 IU/mL    Comment:        IF HEPARIN  RESULTS ARE BELOW EXPECTED VALUES, AND PATIENT DOSAGE HAS BEEN CONFIRMED, SUGGEST FOLLOW UP TESTING OF ANTITHROMBIN III LEVELS.   Glucose, capillary     Status: Abnormal   Collection Time: 08/26/15 10:17 PM  Result Value Ref Range   Glucose-Capillary 248 (H) 65 - 99 mg/dL  Basic metabolic panel       Status: Abnormal   Collection Time: 08/27/15  4:55 AM  Result Value Ref Range   Sodium 141 135 - 145 mmol/L   Potassium 4.3 3.5 - 5.1 mmol/L   Chloride 100 (L) 101 - 111 mmol/L   CO2 33 (H) 22 - 32 mmol/L   Glucose, Bld 229 (H) 65 - 99 mg/dL   BUN 52 (H) 6 - 20 mg/dL   Creatinine, Ser 1.45 (H) 0.61 - 1.24 mg/dL   Calcium 8.4 (L) 8.9 - 10.3 mg/dL   GFR calc non Af Amer 43 (L) >60 mL/min   GFR calc Af Amer 50 (L) >60 mL/min    Comment: (NOTE) The eGFR has been calculated using the CKD EPI equation. This calculation has not been validated in all clinical situations. eGFR's persistently <60 mL/min signify possible Chronic Kidney Disease.    Anion gap 8 5 - 15  Heparin level (unfractionated)     Status: None   Collection Time: 08/27/15  4:55 AM  Result Value Ref Range   Heparin Unfractionated 0.41 0.30 - 0.70 IU/mL    Comment:        IF HEPARIN RESULTS ARE BELOW EXPECTED VALUES, AND PATIENT DOSAGE HAS BEEN CONFIRMED, SUGGEST FOLLOW UP TESTING OF ANTITHROMBIN III LEVELS.   CBC     Status: Abnormal   Collection Time: 08/27/15  4:55 AM  Result Value Ref Range   WBC 10.6 (H) 4.0 - 10.5 K/uL   RBC 3.76 (L) 4.22 - 5.81 MIL/uL   Hemoglobin 10.8 (L) 13.0 - 17.0 g/dL   HCT 34.2 (L) 39.0 - 52.0 %   MCV 91.0 78.0 - 100.0 fL   MCH 28.7 26.0 - 34.0 pg   MCHC 31.6 30.0 - 36.0 g/dL   RDW 15.7 (H) 11.5 - 15.5 %   Platelets 252 150 - 400 K/uL    Imaging: No results found.  Assessment:  Principal Problem:   Sepsis due to pneumonia (HCC) Active Problems:   Diabetes mellitus type 2, controlled (HCC)   HTN (hypertension)   Hyperlipidemia   Diabetic neuropathy (HCC)   Coronary  artery disease involving native coronary artery of native heart without angina pectoris   Obesity   Obstructive sleep apnea   AKI (acute kidney injury) (HCC)   Metabolic encephalopathy   Thrombocytopenia (HCC)   Acute respiratory failure with hypoxia (HCC)   Hard of hearing   Chronic sinus bradycardia   CAP (community acquired pneumonia)   Acute pulmonary edema (HCC)   Septic shock (HCC)   Acute respiratory failure with hypoxemia (HCC)   Systolic CHF, acute on chronic (HCC)   Prolonged QT interval   ARDS (adult respiratory distress syndrome) (HCC)   Renal failure, acute on chronic (HCC)   Acute systolic CHF (congestive heart failure) (HCC)   Persistent atrial fibrillation (HCC)   Severe sepsis (HCC)   Plan:  New a-fib yesterday which has been persistent - also with PVC's on telemetry. ON IV heparin - plan to likely change to DOAC prior to discharge. No room on schedule for cardioversion today. Will optimize further and try to arrange for cardioversion tomorrow.  Continue diuresis again today - creatinine is trending up, but may be around baseline.  Time Spent Directly with Patient:  15 minutes  Length of Stay:  LOS: 9 days   Kenneth C. Hilty, MD, FACC Attending Cardiologist [facility]  Kenneth C Hilty 08/27/2015, 8:56 AM     

## 2015-08-28 NOTE — Progress Notes (Signed)
RN called and stated he had assisted patient with putting on CPAP. RT stopped by to check everything and patient was resting comfortably and everything was good with CPAP.

## 2015-08-28 NOTE — Anesthesia Preprocedure Evaluation (Addendum)
Anesthesia Evaluation  Patient identified by MRN, date of birth, ID band Patient awake    Reviewed: Allergy & Precautions, H&P , NPO status , Patient's Chart, lab work & pertinent test results, reviewed documented beta blocker date and time   Airway Mallampati: II  TM Distance: >3 FB Neck ROM: Full    Dental no notable dental hx. (+) Teeth Intact, Dental Advisory Given   Pulmonary asthma , sleep apnea and Continuous Positive Airway Pressure Ventilation , COPD,  COPD inhaler,    Pulmonary exam normal breath sounds clear to auscultation       Cardiovascular hypertension, Pt. on medications and Pt. on home beta blockers + CAD, + Past MI, + Cardiac Stents and +CHF   Rhythm:Irregular Rate:Normal     Neuro/Psych negative neurological ROS  negative psych ROS   GI/Hepatic Neg liver ROS, GERD  ,  Endo/Other  diabetes, Type 2, Oral Hypoglycemic AgentsMorbid obesity  Renal/GU Renal disease  negative genitourinary   Musculoskeletal   Abdominal   Peds  Hematology negative hematology ROS (+)   Anesthesia Other Findings   Reproductive/Obstetrics negative OB ROS                            Anesthesia Physical Anesthesia Plan  ASA: III  Anesthesia Plan: General   Post-op Pain Management:    Induction: Intravenous  Airway Management Planned: Mask  Additional Equipment:   Intra-op Plan:   Post-operative Plan:   Informed Consent: I have reviewed the patients History and Physical, chart, labs and discussed the procedure including the risks, benefits and alternatives for the proposed anesthesia with the patient or authorized representative who has indicated his/her understanding and acceptance.   Dental advisory given  Plan Discussed with: CRNA  Anesthesia Plan Comments:         Anesthesia Quick Evaluation

## 2015-08-28 NOTE — CV Procedure (Signed)
    Electrical Cardioversion Procedure Note Christopher Holder [id number] 12-30-1929  Procedure: Electrical Cardioversion Indications:  Atrial Fibrillation  Time Out: Verified patient identification, verified procedure,medications/allergies/relevent history reviewed, required imaging and test results available.  Performed  Procedure Details  The patient was NPO after midnight. Anesthesia was administered at the beside  by Dr. Therisa Doyne with 60mg  of propofol.  Cardioversion was performed with synchronized biphasic defibrillation via AP pads with 120 joules.  1 attempt(s) were performed.  The patient converted to normal sinus rhythm. Frequent PVC's at first, occasional junctional escape. When leaving the room, HR was 52 in sinus.  The patient tolerated the procedure well   IMPRESSION:  Successful cardioversion of atrial fibrillation.     SKAINS, [place] 08/28/2015, 10:15 AM

## 2015-08-28 NOTE — Progress Notes (Addendum)
ANTICOAGULATION CONSULT NOTE - Follow Up Consult  Pharmacy Consult for Heparin Indication: New-onset Afib  Allergies  Allergen Reactions  . Percocet [Oxycodone-Acetaminophen] Diarrhea  . Sulfa Antibiotics Diarrhea and Nausea And Vomiting    Patient Measurements: Height: 6' (182.9 cm) Weight: 277 lb (125.646 kg) IBW/kg (Calculated) : 77.6 Heparin Dosing Weight: 108 kg  Vital Signs: Temp: 97.3 F (36.3 C) (12/09 1050) Temp Source: Oral (12/09 1050) BP: 115/52 mmHg (12/09 1050) Pulse Rate: 54 (12/09 1050)  Labs:  Recent Labs  08/26/15 0510  08/27/15 0455 08/27/15 1137 08/28/15 0515 08/28/15 0525  HGB  --   --  10.8*  --   --  10.8*  HCT  --   --  34.2*  --   --  33.5*  PLT  --   --  252  --   --  263  HEPARINUNFRC  --   < > 0.41 0.51 0.54  --   CREATININE 1.33*  --  1.45*  --   --  1.32*  < > = values in this interval not displayed.  Estimated Creatinine Clearance: 58.1 mL/min (by C-G formula based on Cr of 1.32).   Medications:  Heparin @ 1700 units/hr (17 ml/hr)  Assessment: 85 YOM with new-onset Afib overnight. CHADS2VASC score~5. Pharmacy was consulted to start heparin on 12/7 for anticoagulation. S/p cardioversion today. Holding off on po anticoagulation for now due to possible need for a cath during this hospitalization.   Heparin level this afternoon remains therapeutic (0.54 goal of 0.3-0.7). CBC stable. No overt bleeding noted.   Goal of Therapy:  Heparin level 0.3-0.7 units/ml Monitor platelets by anticoagulation protocol: Yes   Plan:  1. Continue heparin at 1700 units/hr (17 ml/hr) 2. Will continue to monitor for any signs/symptoms of bleeding and will follow up with heparin level in the a.m.   Eudelia Bunch, Pharm.D. [id number] 08/28/2015 2:46 PM

## 2015-08-28 NOTE — Interval H&P Note (Signed)
History and Physical Interval Note:  08/28/2015 9:59 AM  Christopher Holder  has presented today for surgery, with the diagnosis of A FIB  The various methods of treatment have been discussed with the patient and family. After consideration of risks, benefits and other options for treatment, the patient has consented to  Procedure(s): CARDIOVERSION ([name]) as a surgical intervention .  The patient's history has been reviewed, patient examined, no change in status, stable for surgery.  I have reviewed the patient's chart and labs.  Questions were answered to the patient's satisfaction.     SKAINS, MARK

## 2015-08-28 NOTE — Progress Notes (Signed)
PROGRESS NOTE    Christopher Holder [id number] DOB: Oct 02, 1929 DOA: 08/18/2015 PCP: Mackie Pai, PA-C  HPI/Brief narrative Chart reviewed. 81 y/o wm w/ complicated medical hx including CHF, prob COPD/asked mother, OSA on CPAP, DM, CAD, HTN, extremely hard of hearing, HLD & GERD. Admitted 11/29 w/ CAP and Sepsis. Was volume resuscitated, abx initiated and admitted to the SDU. Shortly after admission developed progressive respiratory distress w/ copious sputum production. Moved to the ICU for progressive respiratory failure. Suspect that this is a mix of progressive ALI vs Pulmonary edema vs mix of the both. Needed mechanical ventilation. Improved. Extubated 08/21/15. CCM transferred care to [facility] and stepdown unit on 12/4. Requested CCM to reevaluate/follow 12/5. Cardiology consulted 12/5.  Developed atrial fibrillation overnight, which appears to be new  Assessment/Plan:  New PAF:  S/p cardioversion. Will transition heparin gtt to eliquis. chadsvasc 5  Acute hypoxic respiratory failure - Secondary to community acquired pneumonia complicated by either evolving acute lung injury versus pulmonary edema versus mixed of both - S/p VDRF. Extubated 08/21/15. Improving. Will wean oxygen as tolerated.   Probable community-acquired pneumonia Treated with 10 days abx  COPD/asthma Improving. No wheeze. Wean steroids  Severe sepsis - Secondary to community-acquired pneumonia. Improved. Sepsis physiology resolved.  Acute on chronic systolic CHF/cardiomyopathy Continue iv lasix. Monitor bmets  Nonsustained VT On carvedilol  Acute on stage III chronic kidney disease Stable  GERD - Continue PPI  Anemia - Stable.  Thrombocytopenia - Transient and resolved.  Type II DM with peripheral neuropathy Better controlled.  Elevated troponin/CAD Per cardiology  Hyperlipidemia - Continue statins  ? Right leg cellulitis Resolved  Prolonged QTC - Continue monitoring.  OSA  DVT  prophylaxis: Lovenox Code Status: Full Family Communication:  Disposition Plan: SNF when ok with cardiology   Consultants:  CCM  Cardiology  Procedures:  Extubated 11/2  Left subclavian catheter  Antibiotics:  Rocephin 11/29>>>12/1  Ceftaz 12/2>>>12/7  Azitho 11/29>>>12/2  Zosyn IV 1 dose on admission   Subjective: No new complaints  Objective: Filed Vitals:   08/28/15 1024 08/28/15 1025 08/28/15 1050 08/28/15 1144  BP: 97/54  115/52   Pulse:  49 54   Temp:   97.3 F (36.3 C)   TempSrc:   Oral   Resp: 15 13 15    Height:      Weight:      SpO2: 95% 95% 96% 93%    Intake/Output Summary (Last 24 hours) at 08/28/15 1514 Last data filed at 08/28/15 1300  Gross per 24 hour  Intake    423 ml  Output   2825 ml  Net  -2402 ml   Filed Weights   08/27/15 0458 08/28/15 0500 08/28/15 0908  Weight: 126.2 kg (278 lb 3.5 oz) 125.9 kg (277 lb 9 oz) 125.646 kg (277 lb)   Tele: SB rate 50  Exam:  General exam: comfortable. Talkative. Oriented and appropriate Respiratory system: diminished throughout. No wheeze, rales or rhonchi Cardiovascular system: RR slow no MGR Gastrointestinal system: Abdomen is nondistended, soft and nontender. Normal bowel sounds Extremities: trace edema. No clubbing or cyanosis   Data Reviewed: Basic Metabolic Panel:  Recent Labs Lab 08/22/15 0555 08/23/15 0555 08/24/15 0553 08/25/15 0623 08/26/15 0510 08/27/15 0455 08/28/15 0525  NA 144 140 140 143 141 141 144  K 3.9 3.6 4.6 4.1 4.3 4.3 4.1  CL 107 105 104 105 102 100* 102  CO2 28 27 27 29 31  33* 35*  GLUCOSE 152* 137* 197* 252* 223* 229* 133*  BUN 17 14 21* 40* 47* 52* 50*  CREATININE 1.20 1.29* 1.40* 1.50* 1.33* 1.45* 1.32*  CALCIUM 8.1* 8.1* 8.2* 8.5* 8.4* 8.4* 8.3*  MG 2.2 2.2  --   --   --   --  2.6*  PHOS 3.2 3.0  --   --   --   --  3.4   Liver Function Tests: No results for input(s): AST, ALT, ALKPHOS, BILITOT, PROT, ALBUMIN in the last 168 hours. No results  for input(s): LIPASE, AMYLASE in the last 168 hours. No results for input(s): AMMONIA in the last 168 hours. CBC:  Recent Labs Lab 08/22/15 0555 08/23/15 0555 08/25/15 0623 08/27/15 0455 08/28/15 0525  WBC 9.1 8.5 8.0 10.6* 10.2  HGB 10.3* 10.1* 10.5* 10.8* 10.8*  HCT 32.9* 32.0* 31.9* 34.2* 33.5*  MCV 92.2 91.7 90.6 91.0 92.0  PLT 156 171 217 252 263   Cardiac Enzymes: No results for input(s): CKTOTAL, CKMB, CKMBINDEX, TROPONINI in the last 168 hours. BNP (last 3 results) No results for input(s): PROBNP in the last 8760 hours. CBG:  Recent Labs Lab 08/27/15 1709 08/27/15 2218 08/28/15 0804 08/28/15 0906 08/28/15 1212  GLUCAP 243* 350* 102* 99 137*    Recent Results (from the past 240 hour(s))  Culture, respiratory (NON-Expectorated)     Status: None   Collection Time: 08/18/15  5:14 PM  Result Value Ref Range Status   Specimen Description TRACHEAL ASPIRATE  Final   Special Requests NONE  Final   Gram Stain   Final    FEW WBC PRESENT,BOTH PMN AND MONONUCLEAR RARE SQUAMOUS EPITHELIAL CELLS PRESENT NO ORGANISMS SEEN Performed at [facility]    Culture   Final    NO GROWTH 2 DAYS Performed at [facility]    Report Status 08/20/2015 FINAL  Final         Studies: Dg Chest Port 1 View  08/27/2015  CLINICAL DATA:  Shortness of breath with exertion. Recent respiratory failure. EXAM: PORTABLE CHEST 1 VIEW COMPARISON:  08/25/2015 chest radiograph and CT chest. FINDINGS: Trachea is midline. Left subclavian central line tip projects over the SVC. Heart size stable. There has been some interval improvement in diffuse patchy bilateral airspace opacification. Residual small left pleural effusion. IMPRESSION: Improving diffuse patchy bilateral airspace opacification and bilateral effusions. Time course suggests improving congestive heart failure. Pneumonia is not excluded. Electronically Signed   By: Lorin Picket M.D.   On: 08/27/2015 14:59         Scheduled Meds: . allopurinol  100 mg Oral BID  . aspirin  81 mg Oral Daily  . budesonide (PULMICORT) nebulizer solution  0.5 mg Nebulization BID  . carvedilol  6.25 mg Oral BID WC  . feeding supplement (ENSURE ENLIVE)  237 mL Oral BID BM  . feeding supplement (PRO-STAT SUGAR FREE 64)  30 mL Oral TID  . furosemide  40 mg Intravenous BID  . gabapentin  600 mg Oral TID  . insulin aspart  0-5 Units Subcutaneous QHS  . insulin aspart  0-9 Units Subcutaneous TID WC  . insulin glargine  20 Units Subcutaneous QHS  . ipratropium-albuterol  3 mL Nebulization QID  . pantoprazole  40 mg Oral Daily  . potassium chloride  10 mEq Oral BID  . predniSONE  30 mg Oral Q breakfast  . simvastatin  20 mg Oral Daily  . sodium chloride  3 mL Intravenous Q12H  . sodium chloride  3 mL Intravenous Q12H   Continuous Infusions: . sodium  chloride    . heparin 1,700 Units/hr (08/28/15 0317)    Principal Problem:   Sepsis due to pneumonia [facility]) Active Problems:   Diabetes mellitus type 2, controlled ([place])   HTN (hypertension)   Hyperlipidemia   Diabetic neuropathy ([place])   Coronary artery disease involving native coronary artery of native heart without angina pectoris   Obesity   Obstructive sleep apnea   AKI (acute kidney injury) ([place])   Metabolic encephalopathy   Thrombocytopenia (HCC)   Acute respiratory failure with hypoxia (HCC)   Hard of hearing   Chronic sinus bradycardia   CAP (community acquired pneumonia)   Acute pulmonary edema (HCC)   Septic shock (HCC)   Acute respiratory failure with hypoxemia (HCC)   Systolic CHF, acute on chronic (HCC)   Prolonged QT interval   ARDS (adult respiratory distress syndrome) (HCC)   Renal failure, acute on chronic (HCC)   Acute systolic CHF (congestive heart failure) (HCC)   Persistent atrial fibrillation (HCC)   Severe sepsis ([place])    Time spent: 25 minutes    Delfina Redwood, MD [facility]  [email or web address] Password  [email or web address] 08/28/2015, 3:14 PM    LOS: 10 days

## 2015-08-28 NOTE — Progress Notes (Signed)
Pt transferred to 3W-26 via bed on [facility] with NAx2. Pt transferred with belongings. Report called to Williams, RN with all questions answered per receiving RN, Chrissy.

## 2015-08-28 NOTE — Progress Notes (Signed)
ANTICOAGULATION CONSULT NOTE - Follow Up Consult  Pharmacy Consult for Heparin Indication: New-onset Afib  Allergies  Allergen Reactions  . Percocet [Oxycodone-Acetaminophen] Diarrhea  . Sulfa Antibiotics Diarrhea and Nausea And Vomiting    Patient Measurements: Height: 6' (182.9 cm) Weight: 277 lb (125.646 kg) IBW/kg (Calculated) : 77.6 Heparin Dosing Weight: 108 kg  Vital Signs: Temp: 98.2 F (36.8 C) (12/09 1544) Temp Source: Oral (12/09 1544) BP: 128/72 mmHg (12/09 1800) Pulse Rate: 57 (12/09 1800)  Labs:  Recent Labs  08/26/15 0510  08/27/15 0455 08/27/15 1137 08/28/15 0515 08/28/15 0525  HGB  --   --  10.8*  --   --  10.8*  HCT  --   --  34.2*  --   --  33.5*  PLT  --   --  252  --   --  263  HEPARINUNFRC  --   < > 0.41 0.51 0.54  --   CREATININE 1.33*  --  1.45*  --   --  1.32*  < > = values in this interval not displayed.  Estimated Creatinine Clearance: 58.1 mL/min (by C-G formula based on Cr of 1.32).   Medications:   Assessment: 12 YOM with new-onset Afib overnight. CHADS2VASC score~5. She was started on IV heparin. Rx was asked to transition her to apixaban tonight. Her age >40 but wt is >60 kg. Will use standard dose.   Goal of Therapy:  Heparin level 0.3-0.7 units/ml Monitor platelets by anticoagulation protocol: Yes   Plan:   Dc heparin Start apixaban 5mg  PO BID F/u CBC  Onnie Boer, PharmD Pager: [phone] 08/28/2015 6:42 PM

## 2015-08-28 NOTE — Transfer of Care (Signed)
Immediate Anesthesia Transfer of Care Note  Patient: Christopher Holder  Procedure(s) Performed: Procedure(s): CARDIOVERSION ([name])  Patient Location: PACU and Endoscopy Unit  Anesthesia Type:General  Level of Consciousness: awake, alert , patient cooperative and responds to stimulation  Airway & Oxygen Therapy: Patient Spontanous Breathing and Patient connected to nasal cannula oxygen  Post-op Assessment: Report given to RN, Post -op Vital signs reviewed and stable and Patient moving all extremities X 4  Post vital signs: Reviewed and stable  Last Vitals:  Filed Vitals:   08/28/15 0759 08/28/15 0908  BP: 124/95 132/42  Pulse: 75   Temp: 36.4 C 36.6 C  Resp: 17 20    Complications: No apparent anesthesia complications

## 2015-08-29 ENCOUNTER — Inpatient Hospital Stay (HOSPITAL_COMMUNITY): Payer: Medicare Other

## 2015-08-29 DIAGNOSIS — R042 Hemoptysis: Secondary | ICD-10-CM | POA: Diagnosis present

## 2015-08-29 LAB — BASIC METABOLIC PANEL
ANION GAP: 8 (ref 5–15)
BUN: 48 mg/dL — ABNORMAL HIGH (ref 6–20)
CHLORIDE: 99 mmol/L — AB (ref 101–111)
CO2: 38 mmol/L — AB (ref 22–32)
Calcium: 8.3 mg/dL — ABNORMAL LOW (ref 8.9–10.3)
Creatinine, Ser: 1.23 mg/dL (ref 0.61–1.24)
GFR calc non Af Amer: 52 mL/min — ABNORMAL LOW (ref >60)
GLUCOSE: 103 mg/dL — AB (ref 65–99)
Potassium: 4 mmol/L (ref 3.5–5.1)
Sodium: 145 mmol/L (ref 135–145)

## 2015-08-29 LAB — CBC
HEMATOCRIT: 34.5 % — AB (ref 39.0–52.0)
HEMOGLOBIN: 11 g/dL — AB (ref 13.0–17.0)
MCH: 29.6 pg (ref 26.0–34.0)
MCHC: 31.9 g/dL (ref 30.0–36.0)
MCV: 92.7 fL (ref 78.0–100.0)
Platelets: 276 10*3/uL (ref 150–400)
RBC: 3.72 MIL/uL — ABNORMAL LOW (ref 4.22–5.81)
RDW: 16.2 % — ABNORMAL HIGH (ref 11.5–15.5)
WBC: 10.1 10*3/uL (ref 4.0–10.5)

## 2015-08-29 LAB — GLUCOSE, CAPILLARY
GLUCOSE-CAPILLARY: 91 mg/dL (ref 65–99)
GLUCOSE-CAPILLARY: 183 mg/dL — AB (ref 65–99)
GLUCOSE-CAPILLARY: 227 mg/dL — AB (ref 65–99)
Glucose-Capillary: 161 mg/dL — ABNORMAL HIGH (ref 65–99)

## 2015-08-29 LAB — PHOSPHORUS: PHOSPHORUS: 3.5 mg/dL (ref 2.5–4.6)

## 2015-08-29 LAB — MAGNESIUM: Magnesium: 2.7 mg/dL — ABNORMAL HIGH (ref 1.7–2.4)

## 2015-08-29 MED ORDER — BENZONATATE 100 MG PO CAPS
200.0000 mg | ORAL_CAPSULE | Freq: Three times a day (TID) | ORAL | Status: DC
  Administered 2015-08-29 – 2015-09-02 (×12): 200 mg via ORAL
  Filled 2015-08-29 (×12): qty 2

## 2015-08-29 MED ORDER — PREDNISONE 10 MG PO TABS: 10.0000 mg | ORAL_TABLET | Freq: Every day | ORAL | Status: DC

## 2015-08-29 MED ORDER — GUAIFENESIN-DM 100-10 MG/5ML PO SYRP
5.0000 mL | ORAL_SOLUTION | ORAL | Status: DC | PRN
Start: 2015-08-29 — End: 2015-09-02
  Administered 2015-08-29: 5 mL via ORAL
  Filled 2015-08-29: qty 5

## 2015-08-29 MED ORDER — FUROSEMIDE 40 MG PO TABS
40.0000 mg | ORAL_TABLET | Freq: Two times a day (BID) | ORAL | Status: DC
  Administered 2015-08-29 – 2015-09-02 (×8): 40 mg via ORAL
  Filled 2015-08-29 (×8): qty 1

## 2015-08-29 MED ORDER — PHENOL 1.4 % MT LIQD
1.0000 | OROMUCOSAL | Status: DC | PRN
Start: 2015-08-29 — End: 2015-09-02
  Administered 2015-08-29: 1 via OROMUCOSAL
  Filled 2015-08-29: qty 177

## 2015-08-29 MED ORDER — INSULIN GLARGINE 100 UNIT/ML ~~LOC~~ SOLN
14.0000 [IU] | Freq: Every day | SUBCUTANEOUS | Status: DC
  Administered 2015-08-29: 14 [IU] via SUBCUTANEOUS
  Filled 2015-08-29 (×2): qty 0.14

## 2015-08-29 NOTE — Progress Notes (Addendum)
PROGRESS NOTE    Christopher Holder [id number] DOB: 1929/12/05 DOA: 08/18/2015 PCP: Mackie Pai, PA-C  HPI/Brief narrative Chart reviewed. 81 y/o wm w/ complicated medical hx including CHF, prob COPD/asked mother, OSA on CPAP, DM, CAD, HTN, extremely hard of hearing, HLD & GERD. Admitted 11/29 w/ CAP and Sepsis. Was volume resuscitated, abx initiated and admitted to the SDU. Shortly after admission developed progressive respiratory distress w/ copious sputum production. Moved to the ICU for progressive respiratory failure. Suspect that this is a mix of progressive ALI vs Pulmonary edema vs mix of the both. Needed mechanical ventilation. Improved. Extubated 08/21/15. CCM transferred care to [facility] and stepdown unit on 12/4. Requested CCM to reevaluate/follow 12/5. Cardiology consulted 12/5.  Developed atrial fibrillation, started on heparin gtt and DCCV 12/9  Assessment/Plan:  New PAF:  S/p cardioversion. Now on eliquis chadsvasc 5  Acute hypoxic respiratory failure - Secondary to community acquired pneumonia complicated by either evolving acute lung injury versus pulmonary edema versus mixed of both. Change lasix to po - S/p VDRF. Extubated 08/21/15. Improving. Will wean oxygen as tolerated.  Hemoptysis: started yesterday before first dose eliquis. Scant. Will repeat CXR. Has completed pna abx course. Add tessalon.  Probable community-acquired pneumonia Treated with 10 days abx  COPD/asthma Improving. No wheeze. Wean steroids  Severe sepsis - Secondary to community-acquired pneumonia. Improved. Sepsis physiology resolved.  Acute on chronic systolic CHF/cardiomyopathy Continue iv lasix. Monitor bmets  Nonsustained VT On carvedilol  Acute on stage III chronic kidney disease Improving daily  GERD - Continue PPI  Anemia - Stable.  Thrombocytopenia - Transient and resolved.  Type II DM with peripheral neuropathy Decrease lantus to avoid hypoglycemia with taper of  pred  Elevated troponin/CAD Per cardiology  Hyperlipidemia - Continue statins  ? Right leg cellulitis Resolved  Prolonged QTC - Continue monitoring.  OSA  DVT prophylaxis: Lovenox Code Status: Full Family Communication:  Disposition Plan: SNF when stable, hopefully 1-2 days   Consultants:  CCM  Cardiology  Procedures:  Extubated 11/2  Left subclavian catheter  DCCV  Antibiotics:  Rocephin 11/29>>>12/1  Ceftaz 12/2>>>12/7  Azitho 11/29>>>12/2  Zosyn IV 1 dose on admission   Subjective: C/o hemoptysis.   Objective: Filed Vitals:   08/28/15 1937 08/28/15 2101 08/28/15 2105 08/29/15 0411  BP: 118/42   133/52  Pulse: 56   50  Temp: 98.1 F (36.7 C)   98.3 F (36.8 C)  TempSrc: Oral   Axillary  Resp:      Height:      Weight:    123.378 kg (272 lb)  SpO2: 96% 94% 95% 96%    Intake/Output Summary (Last 24 hours) at 08/29/15 1051 Last data filed at 08/29/15 0747  Gross per 24 hour  Intake    840 ml  Output   1460 ml  Net   -620 ml   Filed Weights   08/28/15 0500 08/28/15 0908 08/29/15 0411  Weight: 125.9 kg (277 lb 9 oz) 125.646 kg (277 lb) 123.378 kg (272 lb)   Tele: SB rate 50  Exam:  General exam: comfortable. Talkative. Oriented and appropriate. Tablespoon hemoptysis at bedside. HEENT: very dry MM. No thrush. No blood noted Respiratory system: diminished throughout. No wheeze, rales or rhonchi Cardiovascular system: RR slow no MGR Gastrointestinal system: Abdomen is nondistended, soft and nontender. Normal bowel sounds Extremities: no edema. No clubbing or cyanosis   Data Reviewed: Basic Metabolic Panel:  Recent Labs Lab 08/23/15 0555  08/25/15 [id number] 08/26/15 0510 08/27/15 0455 08/28/15 [id number]  08/29/15 0445  NA 140  < > 143 141 141 144 145  K 3.6  < > 4.1 4.3 4.3 4.1 4.0  CL 105  < > 105 102 100* 102 99*  CO2 27  < > 29 31 33* 35* 38*  GLUCOSE 137*  < > 252* 223* 229* 133* 103*  BUN 14  < > 40* 47* 52* 50* 48*   CREATININE 1.29*  < > 1.50* 1.33* 1.45* 1.32* 1.23  CALCIUM 8.1*  < > 8.5* 8.4* 8.4* 8.3* 8.3*  MG 2.2  --   --   --   --  2.6* 2.7*  PHOS 3.0  --   --   --   --  3.4 3.5  < > = values in this interval not displayed. Liver Function Tests: No results for input(s): AST, ALT, ALKPHOS, BILITOT, PROT, ALBUMIN in the last 168 hours. No results for input(s): LIPASE, AMYLASE in the last 168 hours. No results for input(s): AMMONIA in the last 168 hours. CBC:  Recent Labs Lab 08/23/15 0555 08/25/15 0623 08/27/15 0455 08/28/15 0525 08/29/15 0445  WBC 8.5 8.0 10.6* 10.2 10.1  HGB 10.1* 10.5* 10.8* 10.8* 11.0*  HCT 32.0* 31.9* 34.2* 33.5* 34.5*  MCV 91.7 90.6 91.0 92.0 92.7  PLT 171 217 252 263 276   Cardiac Enzymes: No results for input(s): CKTOTAL, CKMB, CKMBINDEX, TROPONINI in the last 168 hours. BNP (last 3 results) No results for input(s): PROBNP in the last 8760 hours. CBG:  Recent Labs Lab 08/28/15 0906 08/28/15 1212 08/28/15 1541 08/28/15 2049 08/29/15 0723  GLUCAP 99 137* 161* 238* 91    No results found for this or any previous visit (from the past 240 hour(s)).       Studies: Dg Chest Port 1 View  08/27/2015  CLINICAL DATA:  Shortness of breath with exertion. Recent respiratory failure. EXAM: PORTABLE CHEST 1 VIEW COMPARISON:  08/25/2015 chest radiograph and CT chest. FINDINGS: Trachea is midline. Left subclavian central line tip projects over the SVC. Heart size stable. There has been some interval improvement in diffuse patchy bilateral airspace opacification. Residual small left pleural effusion. IMPRESSION: Improving diffuse patchy bilateral airspace opacification and bilateral effusions. Time course suggests improving congestive heart failure. Pneumonia is not excluded. Electronically Signed   By: Lorin Picket M.D.   On: 08/27/2015 14:59        Scheduled Meds: . allopurinol  100 mg Oral BID  . apixaban  5 mg Oral Q12H  . aspirin  81 mg Oral Daily  .  benzonatate  200 mg Oral TID  . budesonide (PULMICORT) nebulizer solution  0.5 mg Nebulization BID  . carvedilol  6.25 mg Oral BID WC  . feeding supplement (ENSURE ENLIVE)  237 mL Oral BID BM  . feeding supplement (PRO-STAT SUGAR FREE 64)  30 mL Oral TID  . furosemide  40 mg Intravenous BID  . gabapentin  600 mg Oral TID  . insulin aspart  0-5 Units Subcutaneous QHS  . insulin aspart  0-9 Units Subcutaneous TID WC  . insulin glargine  16 Units Subcutaneous QHS  . ipratropium-albuterol  3 mL Nebulization QID  . pantoprazole  40 mg Oral Daily  . potassium chloride  10 mEq Oral BID  . [START ON 08/30/2015] predniSONE  10 mg Oral Q breakfast  . simvastatin  20 mg Oral Daily  . sodium chloride  3 mL Intravenous Q12H  . sodium chloride  3 mL Intravenous Q12H   Continuous Infusions: . sodium  chloride      Principal Problem:   Sepsis due to pneumonia [facility]) Active Problems:   Diabetes mellitus type 2, controlled ([place])   HTN (hypertension)   Hyperlipidemia   Diabetic neuropathy (HCC)   Coronary artery disease involving native coronary artery of native heart without angina pectoris   Obesity   Obstructive sleep apnea   AKI (acute kidney injury) ([place])   Metabolic encephalopathy   Thrombocytopenia (HCC)   Acute respiratory failure with hypoxia (HCC)   Hard of hearing   Chronic sinus bradycardia   CAP (community acquired pneumonia)   Acute pulmonary edema (HCC)   Septic shock (HCC)   Acute respiratory failure with hypoxemia (HCC)   Systolic CHF, acute on chronic (HCC)   Prolonged QT interval   ARDS (adult respiratory distress syndrome) (HCC)   Renal failure, acute on chronic (HCC)   Acute systolic CHF (congestive heart failure) (HCC)   Persistent atrial fibrillation (HCC)   Severe sepsis ([place])    Time spent: 25 minutes    Delfina Redwood, MD [facility]  [email or web address] Password [email or web address] 08/29/2015, 10:51 AM    LOS: 11 days

## 2015-08-29 NOTE — Progress Notes (Signed)
Paged the on call triad NP Rogue Bussing about the client awaking up having a coughing spell, throat hurting, and blood in his sputum every time his cough was productive. Received orders for 25ml of guaifenesin-dextromethorphan PRN Q4 and Chloraseptic spray as needed. Will continue to monitor the client closely.

## 2015-08-29 NOTE — Progress Notes (Signed)
DAILY PROGRESS NOTE  Subjective:   81 year old gentleman with a history of diabetes mellitus, congestive heart failure, coronary artery disease. He was admitted with pneumonia and sepsis syndrome. He was found to have atrial fibrillation. He was successfully cardioverted yesterday. He is maintaining normal sinus rhythm/sinus bradycardia.  Persistent a-fib - CHADSVASC of 5.  States breathing is better today. Creatinine climbed slightly with additional diuresis. BNP elevated at 1054. BUN now 52 - attributed to steroids. On IV heparin.   He has been started on Eliquis . He's having some scant hemoptysis which seems to started before starting the anticoagulation.  Objective:  Temp:  [97.3 F (36.3 C)-98.3 F (36.8 C)] 98.3 F (36.8 C) (12/10 0411) Pulse Rate:  [45-65] 50 (12/10 0411) Resp:  [13-20] 19 (12/09 1800) BP: (97-133)/(42-72) 133/52 mmHg (12/10 0411) SpO2:  [91 %-96 %] 96 % (12/10 0411) Weight:  [272 lb (123.378 kg)] 272 lb (123.378 kg) (12/10 0411) Weight change: -9 oz (-0.254 kg)  Intake/Output from previous day: 12/09 0701 - 12/10 0700 In: 1090 [P.O.:840; I.V.:250] Out: 1525 [Urine:1525]  Intake/Output from this shift: Total I/O In: -  Out: 210 [Urine:210]  Medications: Current Facility-Administered Medications  Medication Dose Route Frequency Provider Last Rate Last Dose  . 0.9 %  sodium chloride infusion  250 mL Intravenous Continuous Pixie Casino, MD   250 mL at 08/28/15 0900  . albuterol (PROVENTIL) (2.5 MG/3ML) 0.083% nebulizer solution 3 mL  3 mL Inhalation Q2H PRN Dianne Dun, NP   3 mL at 08/24/15 1744  . allopurinol (ZYLOPRIM) tablet 100 mg  100 mg Oral BID Modena Jansky, MD   100 mg at 08/29/15 0902  . apixaban (ELIQUIS) tablet 5 mg  5 mg Oral Q12H Delfina Redwood, MD   5 mg at 08/29/15 0656  . aspirin chewable tablet 81 mg  81 mg Oral Daily Modena Jansky, MD   81 mg at 08/29/15 0902  . budesonide (PULMICORT) nebulizer solution  0.5 mg  0.5 mg Nebulization BID Erick Colace, NP   0.5 mg at 08/29/15 0732  . carvedilol (COREG) tablet 6.25 mg  6.25 mg Oral BID WC Modena Jansky, MD   6.25 mg at 08/29/15 0901  . feeding supplement (ENSURE ENLIVE) (ENSURE ENLIVE) liquid 237 mL  237 mL Oral BID BM Asencion Islam, RD   237 mL at 08/28/15 1437  . feeding supplement (PRO-STAT SUGAR FREE 64) liquid 30 mL  30 mL Oral TID Modena Jansky, MD   30 mL at 08/29/15 0902  . furosemide (LASIX) injection 40 mg  40 mg Intravenous BID Delfina Redwood, MD   40 mg at 08/29/15 0902  . gabapentin (NEURONTIN) tablet 600 mg  600 mg Oral TID Modena Jansky, MD   600 mg at 08/29/15 0902  . guaiFENesin-dextromethorphan (ROBITUSSIN DM) 100-10 MG/5ML syrup 5 mL  5 mL Oral Q4H PRN Dianne Dun, NP   5 mL at 08/29/15 0258  . insulin aspart (novoLOG) injection 0-5 Units  0-5 Units Subcutaneous QHS Modena Jansky, MD   2 Units at 08/28/15 2128  . insulin aspart (novoLOG) injection 0-9 Units  0-9 Units Subcutaneous TID WC Modena Jansky, MD   2 Units at 08/28/15 1618  . insulin glargine (LANTUS) injection 16 Units  16 Units Subcutaneous QHS Delfina Redwood, MD   16 Units at 08/28/15 2134  . ipratropium-albuterol (DUONEB) 0.5-2.5 (3) MG/3ML nebulizer solution 3 mL  3 mL Nebulization  QID Delfina Redwood, MD   3 mL at 08/29/15 0734  . pantoprazole (PROTONIX) EC tablet 40 mg  40 mg Oral Daily Jake Church Masters, RPH   40 mg at 08/29/15 0901  . phenol (CHLORASEPTIC) mouth spray 1 spray  1 spray Mouth/Throat PRN Dianne Dun, NP   1 spray at 08/29/15 0258  . potassium chloride (K-DUR,KLOR-CON) CR tablet 10 mEq  10 mEq Oral BID Modena Jansky, MD   10 mEq at 08/29/15 0902  . predniSONE (DELTASONE) tablet 20 mg  20 mg Oral Q breakfast Delfina Redwood, MD   20 mg at 08/29/15 0902  . simvastatin (ZOCOR) tablet 20 mg  20 mg Oral Daily Modena Jansky, MD   20 mg at 08/29/15 0902  . sodium chloride 0.9 % injection 3 mL  3 mL  Intravenous Q12H Samella Parr, NP   3 mL at 08/28/15 2200  . sodium chloride 0.9 % injection 3 mL  3 mL Intravenous Q12H Pixie Casino, MD      . sodium chloride 0.9 % injection 3 mL  3 mL Intravenous PRN Pixie Casino, MD        Physical Exam: General appearance: alert, no distress and moderately obese Neck: JVD - 1 cm above sternal notch and no carotid bruit Lungs: diminished breath sounds bilaterally, rales bilaterally and wheezes bilaterally Heart: RR  Abdomen: soft, non-tender; bowel sounds normal; no masses,  no organomegaly Extremities: extremities normal, atraumatic, no cyanosis or edema Pulses: 2+ and symmetric Skin: Skin color, texture, turgor normal. No rashes or lesions Neurologic: Grossly normal PSych: Pleasant  Lab Results: Results for orders placed or performed during the hospital encounter of 08/18/15 (from the past 48 hour(s))  Heparin level (unfractionated)     Status: None   Collection Time: 08/27/15 11:37 AM  Result Value Ref Range   Heparin Unfractionated 0.51 0.30 - 0.70 IU/mL    Comment:        IF HEPARIN RESULTS ARE BELOW EXPECTED VALUES, AND PATIENT DOSAGE HAS BEEN CONFIRMED, SUGGEST FOLLOW UP TESTING OF ANTITHROMBIN III LEVELS.   Glucose, capillary     Status: Abnormal   Collection Time: 08/27/15 12:18 PM  Result Value Ref Range   Glucose-Capillary 191 (H) 65 - 99 mg/dL  Glucose, capillary     Status: Abnormal   Collection Time: 08/27/15  5:09 PM  Result Value Ref Range   Glucose-Capillary 243 (H) 65 - 99 mg/dL  Glucose, capillary     Status: Abnormal   Collection Time: 08/27/15 10:18 PM  Result Value Ref Range   Glucose-Capillary 350 (H) 65 - 99 mg/dL  Heparin level (unfractionated)     Status: None   Collection Time: 08/28/15  5:15 AM  Result Value Ref Range   Heparin Unfractionated 0.54 0.30 - 0.70 IU/mL    Comment:        IF HEPARIN RESULTS ARE BELOW EXPECTED VALUES, AND PATIENT DOSAGE HAS BEEN CONFIRMED, SUGGEST FOLLOW UP  TESTING OF ANTITHROMBIN III LEVELS.   CBC     Status: Abnormal   Collection Time: 08/28/15  5:25 AM  Result Value Ref Range   WBC 10.2 4.0 - 10.5 K/uL   RBC 3.64 (L) 4.22 - 5.81 MIL/uL   Hemoglobin 10.8 (L) 13.0 - 17.0 g/dL   HCT 33.5 (L) 39.0 - 52.0 %   MCV 92.0 78.0 - 100.0 fL   MCH 29.7 26.0 - 34.0 pg   MCHC 32.2 30.0 - 36.0 g/dL  RDW 16.0 (H) 11.5 - 15.5 %   Platelets 263 150 - 400 K/uL  Basic metabolic panel     Status: Abnormal   Collection Time: 08/28/15  5:25 AM  Result Value Ref Range   Sodium 144 135 - 145 mmol/L   Potassium 4.1 3.5 - 5.1 mmol/L   Chloride 102 101 - 111 mmol/L   CO2 35 (H) 22 - 32 mmol/L   Glucose, Bld 133 (H) 65 - 99 mg/dL   BUN 50 (H) 6 - 20 mg/dL   Creatinine, Ser 1.32 (H) 0.61 - 1.24 mg/dL   Calcium 8.3 (L) 8.9 - 10.3 mg/dL   GFR calc non Af Amer 48 (L) >60 mL/min   GFR calc Af Amer 56 (L) >60 mL/min    Comment: (NOTE) The eGFR has been calculated using the CKD EPI equation. This calculation has not been validated in all clinical situations. eGFR's persistently <60 mL/min signify possible Chronic Kidney Disease.    Anion gap 7 5 - 15  Magnesium     Status: Abnormal   Collection Time: 08/28/15  5:25 AM  Result Value Ref Range   Magnesium 2.6 (H) 1.7 - 2.4 mg/dL  Phosphorus     Status: None   Collection Time: 08/28/15  5:25 AM  Result Value Ref Range   Phosphorus 3.4 2.5 - 4.6 mg/dL  Glucose, capillary     Status: Abnormal   Collection Time: 08/28/15  8:04 AM  Result Value Ref Range   Glucose-Capillary 102 (H) 65 - 99 mg/dL  Glucose, capillary     Status: None   Collection Time: 08/28/15  9:06 AM  Result Value Ref Range   Glucose-Capillary 99 65 - 99 mg/dL  Glucose, capillary     Status: Abnormal   Collection Time: 08/28/15 12:12 PM  Result Value Ref Range   Glucose-Capillary 137 (H) 65 - 99 mg/dL  Glucose, capillary     Status: Abnormal   Collection Time: 08/28/15  3:41 PM  Result Value Ref Range   Glucose-Capillary 161 (H) 65 -  99 mg/dL  Glucose, capillary     Status: Abnormal   Collection Time: 08/28/15  8:49 PM  Result Value Ref Range   Glucose-Capillary 238 (H) 65 - 99 mg/dL  CBC     Status: Abnormal   Collection Time: 08/29/15  4:45 AM  Result Value Ref Range   WBC 10.1 4.0 - 10.5 K/uL   RBC 3.72 (L) 4.22 - 5.81 MIL/uL   Hemoglobin 11.0 (L) 13.0 - 17.0 g/dL   HCT 34.5 (L) 39.0 - 52.0 %   MCV 92.7 78.0 - 100.0 fL   MCH 29.6 26.0 - 34.0 pg   MCHC 31.9 30.0 - 36.0 g/dL   RDW 16.2 (H) 11.5 - 15.5 %   Platelets 276 150 - 400 K/uL  Basic metabolic panel     Status: Abnormal   Collection Time: 08/29/15  4:45 AM  Result Value Ref Range   Sodium 145 135 - 145 mmol/L   Potassium 4.0 3.5 - 5.1 mmol/L   Chloride 99 (L) 101 - 111 mmol/L   CO2 38 (H) 22 - 32 mmol/L   Glucose, Bld 103 (H) 65 - 99 mg/dL   BUN 48 (H) 6 - 20 mg/dL   Creatinine, Ser 1.23 0.61 - 1.24 mg/dL   Calcium 8.3 (L) 8.9 - 10.3 mg/dL   GFR calc non Af Amer 52 (L) >60 mL/min   GFR calc Af Amer >60 >60 mL/min  Comment: (NOTE) The eGFR has been calculated using the CKD EPI equation. This calculation has not been validated in all clinical situations. eGFR's persistently <60 mL/min signify possible Chronic Kidney Disease.    Anion gap 8 5 - 15  Magnesium     Status: Abnormal   Collection Time: 08/29/15  4:45 AM  Result Value Ref Range   Magnesium 2.7 (H) 1.7 - 2.4 mg/dL  Phosphorus     Status: None   Collection Time: 08/29/15  4:45 AM  Result Value Ref Range   Phosphorus 3.5 2.5 - 4.6 mg/dL  Glucose, capillary     Status: None   Collection Time: 08/29/15  7:23 AM  Result Value Ref Range   Glucose-Capillary 91 65 - 99 mg/dL    Imaging: Dg Chest Port 1 View  08/27/2015  CLINICAL DATA:  Shortness of breath with exertion. Recent respiratory failure. EXAM: PORTABLE CHEST 1 VIEW COMPARISON:  08/25/2015 chest radiograph and CT chest. FINDINGS: Trachea is midline. Left subclavian central line tip projects over the SVC. Heart size stable.  There has been some interval improvement in diffuse patchy bilateral airspace opacification. Residual small left pleural effusion. IMPRESSION: Improving diffuse patchy bilateral airspace opacification and bilateral effusions. Time course suggests improving congestive heart failure. Pneumonia is not excluded. Electronically Signed   By: Lorin Picket M.D.   On: 08/27/2015 14:59    Assessment:/ Plan   1. Atrial fibrillation: He seems to be maintaining normal sinus rhythm. I suspect this was due to his pneumonia. 2. Coronary artery disease: Stable. He's not having any angina 3. Community-acquired pneumonia: The patient is having some hemoptysis. Further plans per internal medicine team.     Dakari Stabler, Wonda Cheng, MD  08/29/2015 10:16 AM    [facility] [place],  [place], [place]  [address] Pager [phone] Phone: [phone]; Fax: [phone]   [facility]  [address] [place], [place]  [address] [phone]   Fax [phone]

## 2015-08-29 NOTE — Progress Notes (Signed)
Additional ambulation at 1430 08/29/15 for 210 feet with O2 per N/C at 3 LPM . Ambulation tolerated well.

## 2015-08-30 DIAGNOSIS — I480 Paroxysmal atrial fibrillation: Secondary | ICD-10-CM | POA: Insufficient documentation

## 2015-08-30 LAB — CBC
HCT: 35.5 % — ABNORMAL LOW (ref 39.0–52.0)
Hemoglobin: 11.2 g/dL — ABNORMAL LOW (ref 13.0–17.0)
MCH: 29.5 pg (ref 26.0–34.0)
MCHC: 31.5 g/dL (ref 30.0–36.0)
MCV: 93.4 fL (ref 78.0–100.0)
PLATELETS: 297 10*3/uL (ref 150–400)
RBC: 3.8 MIL/uL — ABNORMAL LOW (ref 4.22–5.81)
RDW: 16.3 % — AB (ref 11.5–15.5)
WBC: 9.3 10*3/uL (ref 4.0–10.5)

## 2015-08-30 LAB — PHOSPHORUS: PHOSPHORUS: 4.1 mg/dL (ref 2.5–4.6)

## 2015-08-30 LAB — GLUCOSE, CAPILLARY
GLUCOSE-CAPILLARY: 137 mg/dL — AB (ref 65–99)
Glucose-Capillary: 141 mg/dL — ABNORMAL HIGH (ref 65–99)
Glucose-Capillary: 189 mg/dL — ABNORMAL HIGH (ref 65–99)
Glucose-Capillary: 190 mg/dL — ABNORMAL HIGH (ref 65–99)

## 2015-08-30 LAB — BASIC METABOLIC PANEL
Anion gap: 7 (ref 5–15)
BUN: 46 mg/dL — ABNORMAL HIGH (ref 6–20)
CALCIUM: 8.3 mg/dL — AB (ref 8.9–10.3)
CO2: 37 mmol/L — ABNORMAL HIGH (ref 22–32)
CREATININE: 1.23 mg/dL (ref 0.61–1.24)
Chloride: 100 mmol/L — ABNORMAL LOW (ref 101–111)
GFR, EST NON AFRICAN AMERICAN: 52 mL/min — AB (ref >60)
Glucose, Bld: 123 mg/dL — ABNORMAL HIGH (ref 65–99)
Potassium: 4.2 mmol/L (ref 3.5–5.1)
SODIUM: 144 mmol/L (ref 135–145)

## 2015-08-30 LAB — MAGNESIUM: MAGNESIUM: 2.7 mg/dL — AB (ref 1.7–2.4)

## 2015-08-30 MED ORDER — CARVEDILOL 3.125 MG PO TABS
3.1250 mg | ORAL_TABLET | Freq: Two times a day (BID) | ORAL | Status: DC
  Administered 2015-08-30 – 2015-08-31 (×2): 3.125 mg via ORAL
  Filled 2015-08-30 (×2): qty 1

## 2015-08-30 MED ORDER — INSULIN GLARGINE 100 UNIT/ML ~~LOC~~ SOLN
10.0000 [IU] | Freq: Every day | SUBCUTANEOUS | Status: DC
  Administered 2015-08-30 – 2015-09-01 (×3): 10 [IU] via SUBCUTANEOUS
  Filled 2015-08-30 (×5): qty 0.1

## 2015-08-30 NOTE — Discharge Instructions (Signed)
Apixaban oral tablets °What is this medicine? °APIXABAN (a PIX a ban) is an anticoagulant (blood thinner). It is used to lower the chance of stroke in people with a medical condition called atrial fibrillation. It is also used to treat or prevent blood clots in the lungs or in the veins. °This medicine may be used for other purposes; ask your health care provider or pharmacist if you have questions. °What should I tell my health care provider before I take this medicine? °They need to know if you have any of these conditions: °-bleeding disorders °-bleeding in the brain °-blood in your stools (black or tarry stools) or if you have blood in your vomit °-history of stomach bleeding °-kidney disease °-liver disease °-mechanical heart valve °-an unusual or allergic reaction to apixaban, other medicines, foods, dyes, or preservatives °-pregnant or trying to get pregnant °-breast-feeding °How should I use this medicine? °Take this medicine by mouth with a glass of water. Follow the directions on the prescription label. You can take it with or without food. If it upsets your stomach, take it with food. Take your medicine at regular intervals. Do not take it more often than directed. Do not stop taking except on your doctor's advice. Stopping this medicine may increase your risk of a blot clot. Be sure to refill your prescription before you run out of medicine. °Talk to your pediatrician regarding the use of this medicine in children. Special care may be needed. °Overdosage: If you think you have taken too much of this medicine contact a poison control center or emergency room at once. °NOTE: This medicine is only for you. Do not share this medicine with others. °What if I miss a dose? °If you miss a dose, take it as soon as you can. If it is almost time for your next dose, take only that dose. Do not take double or extra doses. °What may interact with this medicine? °This medicine may interact with the following: °-aspirin  and aspirin-like medicines °-certain medicines for fungal infections like ketoconazole and itraconazole °-certain medicines for seizures like carbamazepine and phenytoin °-certain medicines that treat or prevent blood clots like warfarin, enoxaparin, and dalteparin °-clarithromycin °-NSAIDs, medicines for pain and inflammation, like ibuprofen or naproxen °-rifampin °-ritonavir °-St. John's wort °This list may not describe all possible interactions. Give your health care provider a list of all the medicines, herbs, non-prescription drugs, or dietary supplements you use. Also tell them if you smoke, drink alcohol, or use illegal drugs. Some items may interact with your medicine. °What should I watch for while using this medicine? °Notify your doctor or health care professional and seek emergency treatment if you develop breathing problems; changes in vision; chest pain; severe, sudden headache; pain, swelling, warmth in the leg; trouble speaking; sudden numbness or weakness of the face, arm, or leg. These can be signs that your condition has gotten worse. °If you are going to have surgery, tell your doctor or health care professional that you are taking this medicine. °Tell your health care professional that you use this medicine before you have a spinal or epidural procedure. Sometimes people who take this medicine have bleeding problems around the spine when they have a spinal or epidural procedure. This bleeding is very rare. If you have a spinal or epidural procedure while on this medicine, call your health care professional immediately if you have back pain, numbness or tingling (especially in your legs and feet), muscle weakness, paralysis, or loss of bladder or bowel   control. °Avoid sports and activities that might cause injury while you are using this medicine. Severe falls or injuries can cause unseen bleeding. Be careful when using sharp tools or knives. Consider using an electric razor. Take special care  brushing or flossing your teeth. Report any injuries, bruising, or red spots on the skin to your doctor or health care professional. °What side effects may I notice from receiving this medicine? °Side effects that you should report to your doctor or health care professional as soon as possible: °-allergic reactions like skin rash, itching or hives, swelling of the face, lips, or tongue °-signs and symptoms of bleeding such as bloody or black, tarry stools; red or dark-brown urine; spitting up blood or brown material that looks like coffee grounds; red spots on the skin; unusual bruising or bleeding from the eye, gums, or nose °This list may not describe all possible side effects. Call your doctor for medical advice about side effects. You may report side effects to FDA at 1-800-FDA-1088. °Where should I keep my medicine? °Keep out of the reach of children. °Store at room temperature between 20 and 25 degrees C (68 and 77 degrees F). Throw away any unused medicine after the expiration date. °NOTE: This sheet is a summary. It may not cover all possible information. If you have questions about this medicine, talk to your doctor, pharmacist, or health care provider. °  °© 2016, [employer]. (2013-05-10 11:59:24) ° °

## 2015-08-30 NOTE — Progress Notes (Signed)
DAILY PROGRESS NOTE  Subjective:   81 year old gentleman with a history of diabetes mellitus, congestive heart failure, coronary artery disease. He was admitted with pneumonia and sepsis syndrome. He was found to have atrial fibrillation. He was successfully cardioverted yesterday. He is maintaining normal sinus rhythm/sinus bradycardia.  Persistent a-fib - CHADSVASC of 5.  States breathing is better today. Creatinine climbed slightly with additional diuresis. BNP elevated at 1054. BUN now 52 - attributed to steroids. On IV heparin.   He has been started on Eliquis . He's having some scant hemoptysis which seems to started before starting the anticoagulation.  Objective:  Temp:  [97.5 F (36.4 C)-98.1 F (36.7 C)] 98 F (36.7 C) (12/11 0428) Pulse Rate:  [46-55] 50 (12/11 0428) Resp:  [14-18] 14 (12/10 2044) BP: (104-124)/(38-91) 104/91 mmHg (12/11 0428) SpO2:  [93 %-97 %] 93 % (12/11 0428) Weight:  [271 lb 14.4 oz (123.333 kg)] 271 lb 14.4 oz (123.333 kg) (12/11 0428) Weight change: -5 lb 1.6 oz (-2.313 kg)  Intake/Output from previous day: 12/10 0701 - 12/11 0700 In: 930 [P.O.:930] Out: 1135 [Urine:1135]  Intake/Output from this shift: Total I/O In: 360 [P.O.:360] Out: -   Medications: Current Facility-Administered Medications  Medication Dose Route Frequency Provider Last Rate Last Dose  . 0.9 %  sodium chloride infusion  250 mL Intravenous Continuous Pixie Casino, MD   250 mL at 08/28/15 0900  . albuterol (PROVENTIL) (2.5 MG/3ML) 0.083% nebulizer solution 3 mL  3 mL Inhalation Q2H PRN Dianne Dun, NP   3 mL at 08/24/15 1744  . allopurinol (ZYLOPRIM) tablet 100 mg  100 mg Oral BID Modena Jansky, MD   100 mg at 08/30/15 0954  . apixaban (ELIQUIS) tablet 5 mg  5 mg Oral Q12H Delfina Redwood, MD   5 mg at 08/30/15 [phone]  . aspirin chewable tablet 81 mg  81 mg Oral Daily Modena Jansky, MD   81 mg at 08/30/15 0954  . benzonatate (TESSALON) capsule  200 mg  200 mg Oral TID Delfina Redwood, MD   200 mg at 08/30/15 0955  . budesonide (PULMICORT) nebulizer solution 0.5 mg  0.5 mg Nebulization BID Erick Colace, NP   0.5 mg at 08/30/15 2951  . carvedilol (COREG) tablet 6.25 mg  6.25 mg Oral BID WC Modena Jansky, MD   6.25 mg at 08/30/15 0955  . feeding supplement (ENSURE ENLIVE) (ENSURE ENLIVE) liquid 237 mL  237 mL Oral BID BM Asencion Islam, RD   237 mL at 08/29/15 1400  . feeding supplement (PRO-STAT SUGAR FREE 64) liquid 30 mL  30 mL Oral TID Modena Jansky, MD   30 mL at 08/30/15 0954  . furosemide (LASIX) tablet 40 mg  40 mg Oral BID Delfina Redwood, MD   40 mg at 08/30/15 0955  . gabapentin (NEURONTIN) tablet 600 mg  600 mg Oral TID Modena Jansky, MD   600 mg at 08/30/15 0955  . guaiFENesin-dextromethorphan (ROBITUSSIN DM) 100-10 MG/5ML syrup 5 mL  5 mL Oral Q4H PRN Dianne Dun, NP   5 mL at 08/29/15 0258  . insulin aspart (novoLOG) injection 0-5 Units  0-5 Units Subcutaneous QHS Modena Jansky, MD   2 Units at 08/29/15 2147  . insulin aspart (novoLOG) injection 0-9 Units  0-9 Units Subcutaneous TID WC Modena Jansky, MD   1 Units at 08/30/15 0700  . insulin glargine (LANTUS) injection 10 Units  10 Units  Subcutaneous QHS Delfina Redwood, MD      . ipratropium-albuterol (DUONEB) 0.5-2.5 (3) MG/3ML nebulizer solution 3 mL  3 mL Nebulization QID Delfina Redwood, MD   3 mL at 08/30/15 0815  . pantoprazole (PROTONIX) EC tablet 40 mg  40 mg Oral Daily Jake Church Masters, RPH   40 mg at 08/30/15 0955  . phenol (CHLORASEPTIC) mouth spray 1 spray  1 spray Mouth/Throat PRN Dianne Dun, NP   1 spray at 08/29/15 0258  . potassium chloride (K-DUR,KLOR-CON) CR tablet 10 mEq  10 mEq Oral BID Modena Jansky, MD   10 mEq at 08/30/15 0955  . simvastatin (ZOCOR) tablet 20 mg  20 mg Oral Daily Modena Jansky, MD   20 mg at 08/30/15 0955  . sodium chloride 0.9 % injection 3 mL  3 mL Intravenous Q12H Samella Parr, NP   3 mL at 08/30/15 0053  . sodium chloride 0.9 % injection 3 mL  3 mL Intravenous Q12H Pixie Casino, MD   3 mL at 08/29/15 2210  . sodium chloride 0.9 % injection 3 mL  3 mL Intravenous PRN Pixie Casino, MD        Physical Exam: BP 104/91 mmHg  Pulse 50  Temp(Src) 98 F (36.7 C) (Oral)  Resp 14  Ht 6' (1.829 m)  Wt 271 lb 14.4 oz (123.333 kg)  BMI 36.87 kg/m2  SpO2 93%  General appearance: alert, no distress and moderately obese Neck: JVD - 1 cm above sternal notch and no carotid bruit Lungs: diminished breath sounds bilaterally, rales bilaterally and wheezes bilaterally Heart: RR  Abdomen: soft, non-tender; bowel sounds normal; no masses,  no organomegaly Extremities: extremities normal, atraumatic, no cyanosis or edema Pulses: 2+ and symmetric Skin: Skin color, texture, turgor normal. No rashes or lesions Neurologic: Grossly normal PSych: Pleasant  Lab Results: Results for orders placed or performed during the hospital encounter of 08/18/15 (from the past 48 hour(s))  Glucose, capillary     Status: Abnormal   Collection Time: 08/28/15 12:12 PM  Result Value Ref Range   Glucose-Capillary 137 (H) 65 - 99 mg/dL  Glucose, capillary     Status: Abnormal   Collection Time: 08/28/15  3:41 PM  Result Value Ref Range   Glucose-Capillary 161 (H) 65 - 99 mg/dL  Glucose, capillary     Status: Abnormal   Collection Time: 08/28/15  8:49 PM  Result Value Ref Range   Glucose-Capillary 238 (H) 65 - 99 mg/dL  CBC     Status: Abnormal   Collection Time: 08/29/15  4:45 AM  Result Value Ref Range   WBC 10.1 4.0 - 10.5 K/uL   RBC 3.72 (L) 4.22 - 5.81 MIL/uL   Hemoglobin 11.0 (L) 13.0 - 17.0 g/dL   HCT 34.5 (L) 39.0 - 52.0 %   MCV 92.7 78.0 - 100.0 fL   MCH 29.6 26.0 - 34.0 pg   MCHC 31.9 30.0 - 36.0 g/dL   RDW 16.2 (H) 11.5 - 15.5 %   Platelets 276 150 - 400 K/uL  Basic metabolic panel     Status: Abnormal   Collection Time: 08/29/15  4:45 AM  Result Value Ref Range    Sodium 145 135 - 145 mmol/L   Potassium 4.0 3.5 - 5.1 mmol/L   Chloride 99 (L) 101 - 111 mmol/L   CO2 38 (H) 22 - 32 mmol/L   Glucose, Bld 103 (H) 65 - 99 mg/dL   BUN 48 (  H) 6 - 20 mg/dL   Creatinine, Ser 1.23 0.61 - 1.24 mg/dL   Calcium 8.3 (L) 8.9 - 10.3 mg/dL   GFR calc non Af Amer 52 (L) >60 mL/min   GFR calc Af Amer >60 >60 mL/min    Comment: (NOTE) The eGFR has been calculated using the CKD EPI equation. This calculation has not been validated in all clinical situations. eGFR's persistently <60 mL/min signify possible Chronic Kidney Disease.    Anion gap 8 5 - 15  Magnesium     Status: Abnormal   Collection Time: 08/29/15  4:45 AM  Result Value Ref Range   Magnesium 2.7 (H) 1.7 - 2.4 mg/dL  Phosphorus     Status: None   Collection Time: 08/29/15  4:45 AM  Result Value Ref Range   Phosphorus 3.5 2.5 - 4.6 mg/dL  Glucose, capillary     Status: None   Collection Time: 08/29/15  7:23 AM  Result Value Ref Range   Glucose-Capillary 91 65 - 99 mg/dL  Glucose, capillary     Status: Abnormal   Collection Time: 08/29/15 11:15 AM  Result Value Ref Range   Glucose-Capillary 161 (H) 65 - 99 mg/dL  Glucose, capillary     Status: Abnormal   Collection Time: 08/29/15  4:34 PM  Result Value Ref Range   Glucose-Capillary 183 (H) 65 - 99 mg/dL  Glucose, capillary     Status: Abnormal   Collection Time: 08/29/15  8:08 PM  Result Value Ref Range   Glucose-Capillary 227 (H) 65 - 99 mg/dL  CBC     Status: Abnormal   Collection Time: 08/30/15  5:00 AM  Result Value Ref Range   WBC 9.3 4.0 - 10.5 K/uL   RBC 3.80 (L) 4.22 - 5.81 MIL/uL   Hemoglobin 11.2 (L) 13.0 - 17.0 g/dL   HCT 35.5 (L) 39.0 - 52.0 %   MCV 93.4 78.0 - 100.0 fL   MCH 29.5 26.0 - 34.0 pg   MCHC 31.5 30.0 - 36.0 g/dL   RDW 16.3 (H) 11.5 - 15.5 %   Platelets 297 150 - 400 K/uL  Basic metabolic panel     Status: Abnormal   Collection Time: 08/30/15  5:00 AM  Result Value Ref Range   Sodium 144 135 - 145 mmol/L    Potassium 4.2 3.5 - 5.1 mmol/L   Chloride 100 (L) 101 - 111 mmol/L   CO2 37 (H) 22 - 32 mmol/L   Glucose, Bld 123 (H) 65 - 99 mg/dL   BUN 46 (H) 6 - 20 mg/dL   Creatinine, Ser 1.23 0.61 - 1.24 mg/dL   Calcium 8.3 (L) 8.9 - 10.3 mg/dL   GFR calc non Af Amer 52 (L) >60 mL/min   GFR calc Af Amer >60 >60 mL/min    Comment: (NOTE) The eGFR has been calculated using the CKD EPI equation. This calculation has not been validated in all clinical situations. eGFR's persistently <60 mL/min signify possible Chronic Kidney Disease.    Anion gap 7 5 - 15  Magnesium     Status: Abnormal   Collection Time: 08/30/15  5:00 AM  Result Value Ref Range   Magnesium 2.7 (H) 1.7 - 2.4 mg/dL  Phosphorus     Status: None   Collection Time: 08/30/15  5:00 AM  Result Value Ref Range   Phosphorus 4.1 2.5 - 4.6 mg/dL  Glucose, capillary     Status: Abnormal   Collection Time: 08/30/15  8:47 AM  Result  Value Ref Range   Glucose-Capillary 141 (H) 65 - 99 mg/dL   Comment 1 Notify RN    Comment 2 Document in Chart     Imaging: Dg Chest 2 View  08/29/2015  CLINICAL DATA:  Pneumonia, coughing up copious amounts of blood EXAM: CHEST  2 VIEW COMPARISON:  Radiograph 08/27/2015 FINDINGS: Normal cardiac silhouette. LEFT central venous line unchanged. There is patchy bilateral airspace opacities not significantly changed. No pneumothorax. Continuous osteophytosis of the spine. IMPRESSION: Patchy bilateral airspace opacities concerning for multifocal pneumonia. Pulmonary alveolar hemorrhage or edema could have similar pattern. Electronically Signed   By: Suzy Bouchard M.D.   On: 08/29/2015 14:03    Assessment:/ Plan   1. Atrial fibrillation: He seems to be maintaining normal sinus rhythm. I suspect this was due to his pneumonia.  2. Coronary artery disease: Stable. He's not having any angina 3. Community-acquired pneumonia: The patient is having some hemoptysis. Further plans per internal medicine team.  4.  Chronic systolic CHF:   His EF is 25 -30%. Unfortunately, he has become bradycardiac and mildly hypotensive  Will decrease coreg to 3.125 bid    Jesilyn Easom, Wonda Cheng, MD  08/30/2015 10:50 AM    [facility],  [place], [place]  [address] Pager [phone] Phone: [phone]; Fax: [phone]   [facility]  [address] [facility] [place], [place]  [address] [phone]   Fax [phone]

## 2015-08-30 NOTE — Progress Notes (Signed)
Pt ambulated for 230 ft this AM on 3 liters of O2 per nasal cannula. Had 3 stops to rest legs  while ambulating in the hallway. Pt states " I feel a lot better and getting stronger everyday". Will continue to monitor.

## 2015-08-30 NOTE — Progress Notes (Signed)
PROGRESS NOTE    Christopher Holder [id number] DOB: 1930/08/28 DOA: 08/18/2015 PCP: Mackie Pai, PA-C  HPI/Brief narrative Chart reviewed. 81 y/o wm w/ complicated medical hx including CHF, prob COPD/asked mother, OSA on CPAP, DM, CAD, HTN, extremely hard of hearing, HLD & GERD. Admitted 11/29 w/ CAP and Sepsis. Was volume resuscitated, abx initiated and admitted to the SDU. Shortly after admission developed progressive respiratory distress w/ copious sputum production. Moved to the ICU for progressive respiratory failure. Suspect that this is a mix of progressive ALI vs Pulmonary edema vs mix of the both. Needed mechanical ventilation. Improved. Extubated 08/21/15. CCM transferred care to [facility] and stepdown unit on 12/4. Requested CCM to reevaluate/follow 12/5. Cardiology consulted 12/5.  Developed atrial fibrillation, started on heparin gtt and DCCV 12/9  Assessment/Plan:  New PAF:  S/p cardioversion. Now on eliquis chadsvasc 5. HR ranging 40s- 60s. Defer any adjustments in BB to cardiology  Acute hypoxic respiratory failure - Secondary to community acquired pneumonia complicated by either evolving acute lung injury versus pulmonary edema versus mixed of both. Change lasix to po - S/p VDRF. Extubated 08/21/15. Improving. Will wean oxygen as tolerated.  Hemoptysis: seems to be slowing. Now mainly currant jelly sputum.  CXR essentially unchanged, which is not unexpected. CXR clearing may take several weeks  Probable community-acquired pneumonia Treated with 10 days abx  COPD/asthma Improving. No wheeze. Wean steroids  Severe sepsis - Secondary to community-acquired pneumonia. Improved. Sepsis physiology resolved.  Acute on chronic systolic CHF/cardiomyopathy Continue  lasix. Weight 272. Had been as high as 282.  Nonsustained VT On carvedilol  Acute on stage III chronic kidney disease Improving daily  GERD - Continue PPI  Anemia - Stable.  Thrombocytopenia - Transient and  resolved.  Type II DM with peripheral neuropathy CBGs good  Elevated troponin/CAD Per cardiology  Hyperlipidemia - Continue statins  ? Right leg cellulitis Resolved  Prolonged QTC - Continue monitoring.  OSA  DVT prophylaxis: Lovenox Code Status: Full Family Communication:  Disposition Plan: SNF when stable, hopefully 1-2 days   Consultants:  CCM  Cardiology  Procedures:  Extubated 11/2  Left subclavian catheter  DCCV  Antibiotics:  Rocephin 11/29>>>12/1  Ceftaz 12/2>>>12/7  Azitho 11/29>>>12/2  Zosyn IV 1 dose on admission   Subjective: Still with scant hemoptysis. Breathing easier. "I feel better"  Objective: Filed Vitals:   08/29/15 2009 08/29/15 2044 08/29/15 2047 08/30/15 0428  BP: 124/43   104/91  Pulse: 55 49  50  Temp: 97.5 F (36.4 C)   98 F (36.7 C)  TempSrc: Axillary   Oral  Resp:  14    Height:      Weight:    123.333 kg (271 lb 14.4 oz)  SpO2: 96% 95% 96% 93%    Intake/Output Summary (Last 24 hours) at 08/30/15 0921 Last data filed at 08/30/15 0600  Gross per 24 hour  Intake    690 ml  Output    925 ml  Net   -235 ml   Filed Weights   08/28/15 0908 08/29/15 0411 08/30/15 0428  Weight: 125.646 kg (277 lb) 123.378 kg (272 lb) 123.333 kg (271 lb 14.4 oz)   Tele: SB rate 50  Exam:  General exam: comfortable. Occasional cough. 1/4 teaspoon sized sputum in cup. Currant jelly HEENT: very dry MM. No thrush. No blood noted Respiratory system: diminished throughout. No wheeze, rales or rhonchi Cardiovascular system: RR slow no MGR Gastrointestinal system: Abdomen is nondistended, soft and nontender. Normal bowel sounds Extremities: trace  edema. No clubbing or cyanosis   Data Reviewed: Basic Metabolic Panel:  Recent Labs Lab 08/26/15 0510 08/27/15 0455 08/28/15 0525 08/29/15 0445 08/30/15 0500  NA 141 141 144 145 144  K 4.3 4.3 4.1 4.0 4.2  CL 102 100* 102 99* 100*  CO2 31 33* 35* 38* 37*  GLUCOSE 223* 229*  133* 103* 123*  BUN 47* 52* 50* 48* 46*  CREATININE 1.33* 1.45* 1.32* 1.23 1.23  CALCIUM 8.4* 8.4* 8.3* 8.3* 8.3*  MG  --   --  2.6* 2.7* 2.7*  PHOS  --   --  3.4 3.5 4.1   Liver Function Tests: No results for input(s): AST, ALT, ALKPHOS, BILITOT, PROT, ALBUMIN in the last 168 hours. No results for input(s): LIPASE, AMYLASE in the last 168 hours. No results for input(s): AMMONIA in the last 168 hours. CBC:  Recent Labs Lab 08/25/15 0623 08/27/15 0455 08/28/15 0525 08/29/15 0445 08/30/15 0500  WBC 8.0 10.6* 10.2 10.1 9.3  HGB 10.5* 10.8* 10.8* 11.0* 11.2*  HCT 31.9* 34.2* 33.5* 34.5* 35.5*  MCV 90.6 91.0 92.0 92.7 93.4  PLT 217 252 263 276 297   Cardiac Enzymes: No results for input(s): CKTOTAL, CKMB, CKMBINDEX, TROPONINI in the last 168 hours. BNP (last 3 results) No results for input(s): PROBNP in the last 8760 hours. CBG:  Recent Labs Lab 08/29/15 0723 08/29/15 1115 08/29/15 1634 08/29/15 2008 08/30/15 0847  GLUCAP 91 161* 183* 227* 141*    No results found for this or any previous visit (from the past 240 hour(s)).       Studies: Dg Chest 2 View  08/29/2015  CLINICAL DATA:  Pneumonia, coughing up copious amounts of blood EXAM: CHEST  2 VIEW COMPARISON:  Radiograph 08/27/2015 FINDINGS: Normal cardiac silhouette. LEFT central venous line unchanged. There is patchy bilateral airspace opacities not significantly changed. No pneumothorax. Continuous osteophytosis of the spine. IMPRESSION: Patchy bilateral airspace opacities concerning for multifocal pneumonia. Pulmonary alveolar hemorrhage or edema could have similar pattern. Electronically Signed   By: Suzy Bouchard M.D.   On: 08/29/2015 14:03        Scheduled Meds: . allopurinol  100 mg Oral BID  . apixaban  5 mg Oral Q12H  . aspirin  81 mg Oral Daily  . benzonatate  200 mg Oral TID  . budesonide (PULMICORT) nebulizer solution  0.5 mg Nebulization BID  . carvedilol  6.25 mg Oral BID WC  . feeding  supplement (ENSURE ENLIVE)  237 mL Oral BID BM  . feeding supplement (PRO-STAT SUGAR FREE 64)  30 mL Oral TID  . furosemide  40 mg Oral BID  . gabapentin  600 mg Oral TID  . insulin aspart  0-5 Units Subcutaneous QHS  . insulin aspart  0-9 Units Subcutaneous TID WC  . insulin glargine  14 Units Subcutaneous QHS  . ipratropium-albuterol  3 mL Nebulization QID  . pantoprazole  40 mg Oral Daily  . potassium chloride  10 mEq Oral BID  . predniSONE  10 mg Oral Q breakfast  . simvastatin  20 mg Oral Daily  . sodium chloride  3 mL Intravenous Q12H  . sodium chloride  3 mL Intravenous Q12H   Continuous Infusions: . sodium chloride      Principal Problem:   Sepsis due to pneumonia [facility]) Active Problems:   Diabetes mellitus type 2, controlled ([place])   HTN (hypertension)   Hyperlipidemia   Diabetic neuropathy ([place])   Coronary artery disease involving native coronary artery of native  heart without angina pectoris   Obesity   Obstructive sleep apnea   AKI (acute kidney injury) ([place])   Metabolic encephalopathy   Thrombocytopenia ([facility])   Acute respiratory failure with hypoxia (HCC)   Hard of hearing   Chronic sinus bradycardia   CAP (community acquired pneumonia)   Acute pulmonary edema (HCC)   Septic shock (HCC)   Acute respiratory failure with hypoxemia (HCC)   Systolic CHF, acute on chronic (HCC)   Prolonged QT interval   ARDS (adult respiratory distress syndrome) (HCC)   Renal failure, acute on chronic (HCC)   Acute systolic CHF (congestive heart failure) (HCC)   Persistent atrial fibrillation (HCC)   Severe sepsis (HCC)   Hemoptysis    Time spent: 25 minutes    Delfina Redwood, MD [facility]  [email or web address] Password [email or web address] 08/30/2015, 9:21 AM    LOS: 12 days

## 2015-08-31 ENCOUNTER — Inpatient Hospital Stay (HOSPITAL_COMMUNITY): Payer: Medicare Other

## 2015-08-31 ENCOUNTER — Encounter (HOSPITAL_COMMUNITY): Payer: Self-pay | Admitting: Cardiology

## 2015-08-31 DIAGNOSIS — R042 Hemoptysis: Secondary | ICD-10-CM

## 2015-08-31 DIAGNOSIS — G4733 Obstructive sleep apnea (adult) (pediatric): Secondary | ICD-10-CM

## 2015-08-31 DIAGNOSIS — I5022 Chronic systolic (congestive) heart failure: Secondary | ICD-10-CM | POA: Insufficient documentation

## 2015-08-31 DIAGNOSIS — R918 Other nonspecific abnormal finding of lung field: Secondary | ICD-10-CM

## 2015-08-31 DIAGNOSIS — Z9989 Dependence on other enabling machines and devices: Secondary | ICD-10-CM

## 2015-08-31 LAB — GLUCOSE, CAPILLARY
GLUCOSE-CAPILLARY: 137 mg/dL — AB (ref 65–99)
GLUCOSE-CAPILLARY: 142 mg/dL — AB (ref 65–99)
Glucose-Capillary: 125 mg/dL — ABNORMAL HIGH (ref 65–99)
Glucose-Capillary: 181 mg/dL — ABNORMAL HIGH (ref 65–99)

## 2015-08-31 MED ORDER — MOMETASONE FURO-FORMOTEROL FUM 200-5 MCG/ACT IN AERO
2.0000 | INHALATION_SPRAY | Freq: Two times a day (BID) | RESPIRATORY_TRACT | Status: DC
  Administered 2015-09-01: 2 via RESPIRATORY_TRACT
  Filled 2015-08-31 (×2): qty 8.8

## 2015-08-31 MED ORDER — FLUTICASONE PROPIONATE 50 MCG/ACT NA SUSP
2.0000 | Freq: Every day | NASAL | Status: DC
  Administered 2015-08-31 – 2015-09-02 (×3): 2 via NASAL
  Filled 2015-08-31: qty 16

## 2015-08-31 NOTE — Clinical Social Work Note (Signed)
Clinical Social Worker continuing to follow patient and family for support and discharge planning needs.  Patient has chosen a bed at [facility] once medically ready for discharge.  CSW contacted facility to provide update and potential discharge date.  CSW to provide patient insurance with updated therapy notes once closer to discharge.  CSW remains available for support and to facilitate patient discharge needs once medically stable.  Barbette Or, [place]

## 2015-08-31 NOTE — Progress Notes (Signed)
DAILY PROGRESS NOTE  Subjective:   81 year old gentleman with a history of diabetes mellitus, congestive heart failure, coronary artery disease. He was admitted with pneumonia and sepsis syndrome. He was found to have atrial fibrillation. He was successfully cardioverted yesterday. He is maintaining normal sinus rhythm/sinus bradycardia.  Persistent a-fib - CHADSVASC of 5.  States breathing is better today. Creatinine climbed slightly with additional diuresis. BNP elevated at 1054. BUN now 52 - attributed to steroids. On IV heparin.   He has been started on Eliquis . He's having some scant hemoptysis which seems to started before starting the anticoagulation.  Objective:  Temp:  [97.3 F (36.3 C)-98.7 F (37.1 C)] 97.3 F (36.3 C) (12/12 0542) Pulse Rate:  [49-53] 49 (12/12 0542) Resp:  [17-18] 17 (12/12 0542) BP: (103-121)/(37-74) 121/45 mmHg (12/12 0542) SpO2:  [93 %-96 %] 94 % (12/12 0748) Weight:  [253 lb 11.2 oz (115.078 kg)] 253 lb 11.2 oz (115.078 kg) (12/12 0542) Weight change: -18 lb 3.2 oz (-8.255 kg)  Intake/Output from previous day: 12/11 0701 - 12/12 0700 In: 2423 [P.O.:1170] Out: 1715 [Urine:1715]  Intake/Output from this shift: Total I/O In: 360 [P.O.:360] Out: 350 [Urine:350]  Medications: Current Facility-Administered Medications  Medication Dose Route Frequency Provider Last Rate Last Dose  . 0.9 %  sodium chloride infusion  250 mL Intravenous Continuous Pixie Casino, MD   250 mL at 08/28/15 0900  . albuterol (PROVENTIL) (2.5 MG/3ML) 0.083% nebulizer solution 3 mL  3 mL Inhalation Q2H PRN Dianne Dun, NP   3 mL at 08/24/15 1744  . allopurinol (ZYLOPRIM) tablet 100 mg  100 mg Oral BID Modena Jansky, MD   100 mg at 08/31/15 5361  . apixaban (ELIQUIS) tablet 5 mg  5 mg Oral Q12H Delfina Redwood, MD   5 mg at 08/31/15 4431  . aspirin chewable tablet 81 mg  81 mg Oral Daily Modena Jansky, MD   81 mg at 08/31/15 5400  . benzonatate  (TESSALON) capsule 200 mg  200 mg Oral TID Delfina Redwood, MD   200 mg at 08/31/15 0826  . budesonide (PULMICORT) nebulizer solution 0.5 mg  0.5 mg Nebulization BID Erick Colace, NP   0.5 mg at 08/31/15 0748  . carvedilol (COREG) tablet 3.125 mg  3.125 mg Oral BID WC Thayer Headings, MD   3.125 mg at 08/31/15 8676  . feeding supplement (ENSURE ENLIVE) (ENSURE ENLIVE) liquid 237 mL  237 mL Oral BID BM Asencion Islam, RD   237 mL at 08/31/15 0828  . feeding supplement (PRO-STAT SUGAR FREE 64) liquid 30 mL  30 mL Oral TID Modena Jansky, MD   30 mL at 08/31/15 0827  . fluticasone (FLONASE) 50 MCG/ACT nasal spray 2 spray  2 spray Each Nare Daily Delfina Redwood, MD      . furosemide (LASIX) tablet 40 mg  40 mg Oral BID Delfina Redwood, MD   40 mg at 08/31/15 0826  . gabapentin (NEURONTIN) tablet 600 mg  600 mg Oral TID Modena Jansky, MD   600 mg at 08/31/15 0827  . guaiFENesin-dextromethorphan (ROBITUSSIN DM) 100-10 MG/5ML syrup 5 mL  5 mL Oral Q4H PRN Dianne Dun, NP   5 mL at 08/29/15 0258  . insulin aspart (novoLOG) injection 0-5 Units  0-5 Units Subcutaneous QHS Modena Jansky, MD   2 Units at 08/29/15 2147  . insulin aspart (novoLOG) injection 0-9 Units  0-9 Units Subcutaneous  TID WC Modena Jansky, MD   1 Units at 08/31/15 0827  . insulin glargine (LANTUS) injection 10 Units  10 Units Subcutaneous QHS Delfina Redwood, MD   10 Units at 08/30/15 2123  . ipratropium-albuterol (DUONEB) 0.5-2.5 (3) MG/3ML nebulizer solution 3 mL  3 mL Nebulization QID Delfina Redwood, MD   3 mL at 08/31/15 0748  . mometasone-formoterol (DULERA) 200-5 MCG/ACT inhaler 2 puff  2 puff Inhalation BID Delfina Redwood, MD      . pantoprazole (PROTONIX) EC tablet 40 mg  40 mg Oral Daily Jake Church Masters, RPH   40 mg at 08/31/15 0825  . phenol (CHLORASEPTIC) mouth spray 1 spray  1 spray Mouth/Throat PRN Dianne Dun, NP   1 spray at 08/29/15 0258  . potassium chloride  (K-DUR,KLOR-CON) CR tablet 10 mEq  10 mEq Oral BID Modena Jansky, MD   10 mEq at 08/31/15 0826  . simvastatin (ZOCOR) tablet 20 mg  20 mg Oral Daily Modena Jansky, MD   20 mg at 08/31/15 0826  . sodium chloride 0.9 % injection 3 mL  3 mL Intravenous Q12H Samella Parr, NP   3 mL at 08/30/15 1000  . sodium chloride 0.9 % injection 3 mL  3 mL Intravenous Q12H Pixie Casino, MD   3 mL at 08/31/15 0828  . sodium chloride 0.9 % injection 3 mL  3 mL Intravenous PRN Pixie Casino, MD        Physical Exam: BP 121/45 mmHg  Pulse 49  Temp(Src) 97.3 F (36.3 C) (Axillary)  Resp 17  Ht 6' (1.829 m)  Wt 253 lb 11.2 oz (115.078 kg)  BMI 34.40 kg/m2  SpO2 94%  General appearance: alert, no distress and moderately obese Neck: JVD - 1 cm above sternal notch and no carotid bruit Lungs: diminished breath sounds bilaterally, rales bilaterally and wheezes bilaterally Heart: RR  Abdomen: soft, non-tender; bowel sounds normal; no masses,  no organomegaly Extremities: extremities normal, atraumatic, no cyanosis or edema Pulses: 2+ and symmetric Skin: Skin color, texture, turgor normal. No rashes or lesions Neurologic: Grossly normal PSych: Pleasant  Lab Results: Results for orders placed or performed during the hospital encounter of 08/18/15 (from the past 48 hour(s))  Glucose, capillary     Status: Abnormal   Collection Time: 08/29/15  4:34 PM  Result Value Ref Range   Glucose-Capillary 183 (H) 65 - 99 mg/dL  Glucose, capillary     Status: Abnormal   Collection Time: 08/29/15  8:08 PM  Result Value Ref Range   Glucose-Capillary 227 (H) 65 - 99 mg/dL  CBC     Status: Abnormal   Collection Time: 08/30/15  5:00 AM  Result Value Ref Range   WBC 9.3 4.0 - 10.5 K/uL   RBC 3.80 (L) 4.22 - 5.81 MIL/uL   Hemoglobin 11.2 (L) 13.0 - 17.0 g/dL   HCT 35.5 (L) 39.0 - 52.0 %   MCV 93.4 78.0 - 100.0 fL   MCH 29.5 26.0 - 34.0 pg   MCHC 31.5 30.0 - 36.0 g/dL   RDW 16.3 (H) 11.5 - 15.5 %    Platelets 297 150 - 400 K/uL  Basic metabolic panel     Status: Abnormal   Collection Time: 08/30/15  5:00 AM  Result Value Ref Range   Sodium 144 135 - 145 mmol/L   Potassium 4.2 3.5 - 5.1 mmol/L   Chloride 100 (L) 101 - 111 mmol/L   CO2 37 (  H) 22 - 32 mmol/L   Glucose, Bld 123 (H) 65 - 99 mg/dL   BUN 46 (H) 6 - 20 mg/dL   Creatinine, Ser 1.23 0.61 - 1.24 mg/dL   Calcium 8.3 (L) 8.9 - 10.3 mg/dL   GFR calc non Af Amer 52 (L) >60 mL/min   GFR calc Af Amer >60 >60 mL/min    Comment: (NOTE) The eGFR has been calculated using the CKD EPI equation. This calculation has not been validated in all clinical situations. eGFR's persistently <60 mL/min signify possible Chronic Kidney Disease.    Anion gap 7 5 - 15  Magnesium     Status: Abnormal   Collection Time: 08/30/15  5:00 AM  Result Value Ref Range   Magnesium 2.7 (H) 1.7 - 2.4 mg/dL  Phosphorus     Status: None   Collection Time: 08/30/15  5:00 AM  Result Value Ref Range   Phosphorus 4.1 2.5 - 4.6 mg/dL  Glucose, capillary     Status: Abnormal   Collection Time: 08/30/15  8:47 AM  Result Value Ref Range   Glucose-Capillary 141 (H) 65 - 99 mg/dL   Comment 1 Notify RN    Comment 2 Document in Chart   Glucose, capillary     Status: Abnormal   Collection Time: 08/30/15 11:21 AM  Result Value Ref Range   Glucose-Capillary 137 (H) 65 - 99 mg/dL  Glucose, capillary     Status: Abnormal   Collection Time: 08/30/15  4:51 PM  Result Value Ref Range   Glucose-Capillary 189 (H) 65 - 99 mg/dL  Glucose, capillary     Status: Abnormal   Collection Time: 08/30/15  8:55 PM  Result Value Ref Range   Glucose-Capillary 190 (H) 65 - 99 mg/dL  Glucose, capillary     Status: Abnormal   Collection Time: 08/31/15  7:28 AM  Result Value Ref Range   Glucose-Capillary 125 (H) 65 - 99 mg/dL  Glucose, capillary     Status: Abnormal   Collection Time: 08/31/15 11:05 AM  Result Value Ref Range   Glucose-Capillary 137 (H) 65 - 99 mg/dL     Imaging: Dg Chest 2 View  08/29/2015  CLINICAL DATA:  Pneumonia, coughing up copious amounts of blood EXAM: CHEST  2 VIEW COMPARISON:  Radiograph 08/27/2015 FINDINGS: Normal cardiac silhouette. LEFT central venous line unchanged. There is patchy bilateral airspace opacities not significantly changed. No pneumothorax. Continuous osteophytosis of the spine. IMPRESSION: Patchy bilateral airspace opacities concerning for multifocal pneumonia. Pulmonary alveolar hemorrhage or edema could have similar pattern. Electronically Signed   By: Suzy Bouchard M.D.   On: 08/29/2015 14:03    Assessment:/ Plan   1. Atrial fibrillation: He seems to be maintaining normal sinus rhythm. I suspect this was due to his pneumonia. On Eliquis.  2. Coronary artery disease: Stable. He's not having any angina 3. Community-acquired pneumonia: The patient is having some hemoptysis. Further plans per internal medicine team.  4. Chronic systolic CHF:   His EF is 25 -30%. Unfortunately, he has become bradycardiac .   I decreased coreg yesterday, will DC completely today   No additional recs. Will sign off. He can follow up with Dr. Marlou Porch. Call for questions .    Onyx Schirmer, Wonda Cheng, MD  08/31/2015 12:25 PM    [facility] [address],  [place] [place], [place]  [address] Pager [phone] Phone: [phone]; Fax: [phone]   [address]  [place], [place]  [address] [phone]   Fax [phone]

## 2015-08-31 NOTE — Progress Notes (Addendum)
PROGRESS NOTE    Christopher Holder [id number] DOB: September 02, 1930 DOA: 08/18/2015 PCP: Mackie Pai, PA-C  HPI/Brief narrative Chart reviewed. 81 y/o wm w/ complicated medical hx including CHF, prob COPD/asked mother, OSA on CPAP, DM, CAD, HTN, extremely hard of hearing, HLD & GERD. Admitted 11/29 w/ CAP and Sepsis. Was volume resuscitated, abx initiated and admitted to the SDU. Shortly after admission developed progressive respiratory distress w/ copious sputum production. Moved to the ICU for progressive respiratory failure. Suspect that this is a mix of progressive ALI vs Pulmonary edema vs mix of the both. Needed mechanical ventilation. Improved. Extubated 08/21/15. CCM transferred care to [facility] and stepdown unit on 12/4. Requested CCM to reevaluate/follow 12/5. Cardiology consulted 12/5.  Developed atrial fibrillation, started on heparin gtt and DCCV 12/9  Assessment/Plan:  New PAF:  S/p cardioversion. Now on eliquis chadsvasc 5. HR ranging 40s- 60s, SB. Defer any adjustments in BB to cardiology  Acute hypoxic respiratory failure - Secondary to community acquired pneumonia complicated by either evolving acute lung injury versus pulmonary edema versus mixed of both. Changed lasix to po - S/p VDRF. Extubated 08/21/15. Weight wrong? Trending down, though  Hemoptysis: continues. Discussed with Dr. Unknown Jim who will evaluate. Continue antitussives. Completed abx course  Probable community-acquired pneumonia Treated with 10 days abx  COPD/asthma Improving. No wheeze. Wean steroids  Severe sepsis - Secondary to community-acquired pneumonia. Improved. Sepsis physiology resolved.  Acute on chronic systolic CHF/cardiomyopathy Continue  lasix. Weight 272?Marland Kitchen Had been as high as 282.  Nonsustained VT On carvedilol  Acute on stage III chronic kidney disease Improving daily  GERD - Continue PPI  Anemia - Stable.  Thrombocytopenia - Transient and resolved.  Type II DM with peripheral  neuropathy CBGs good  Elevated troponin/CAD Per cardiology  Hyperlipidemia - Continue statin  ? Right leg cellulitis Resolved  OSA on CPAP  DVT prophylaxis: Lovenox Code Status: Full Family Communication: daughter by phone Disposition Plan: SNF when stable, hopefully 1-2 days   Consultants:  CCM  Cardiology  Procedures:  Extubated 11/2  Left subclavian catheter  DCCV  Antibiotics:  Rocephin 11/29>>>12/1  Ceftaz 12/2>>>12/7  Azitho 11/29>>>12/2  Zosyn IV 1 dose on admission   Subjective: Hemoptysis continues. Breathing easier. "I feel better"  Objective: Filed Vitals:   08/30/15 2055 08/30/15 2226 08/31/15 0542 08/31/15 0748  BP: 103/74  121/45   Pulse: 53  49   Temp: 98.7 F (37.1 C)  97.3 F (36.3 C)   TempSrc: Oral  Axillary   Resp:  17 17   Height:      Weight:   115.078 kg (253 lb 11.2 oz)   SpO2: 93%  96% 94%    Intake/Output Summary (Last 24 hours) at 08/31/15 1110 Last data filed at 08/31/15 0937  Gross per 24 hour  Intake   1170 ml  Output   2065 ml  Net   -895 ml   Filed Weights   08/29/15 0411 08/30/15 0428 08/31/15 0542  Weight: 123.378 kg (272 lb) 123.333 kg (271 lb 14.4 oz) 115.078 kg (253 lb 11.2 oz)   Tele: SB rate 50  Exam:  General exam: comfortable. Occasional cough. 2 tbsp sputum in cup. Currant jelly and bright red HEENT: very dry MM. No thrush. No blood noted Respiratory system: diminished throughout. No wheeze, rales or rhonchi Cardiovascular system: RR slow no MGR Gastrointestinal system: Abdomen is nondistended, soft and nontender. Normal bowel sounds Extremities: trace edema. No clubbing or cyanosis   Data Reviewed: Basic Metabolic Panel:  Recent Labs Lab 08/26/15 0510 08/27/15 0455 08/28/15 0525 08/29/15 0445 08/30/15 0500  NA 141 141 144 145 144  K 4.3 4.3 4.1 4.0 4.2  CL 102 100* 102 99* 100*  CO2 31 33* 35* 38* 37*  GLUCOSE 223* 229* 133* 103* 123*  BUN 47* 52* 50* 48* 46*  CREATININE  1.33* 1.45* 1.32* 1.23 1.23  CALCIUM 8.4* 8.4* 8.3* 8.3* 8.3*  MG  --   --  2.6* 2.7* 2.7*  PHOS  --   --  3.4 3.5 4.1   Liver Function Tests: No results for input(s): AST, ALT, ALKPHOS, BILITOT, PROT, ALBUMIN in the last 168 hours. No results for input(s): LIPASE, AMYLASE in the last 168 hours. No results for input(s): AMMONIA in the last 168 hours. CBC:  Recent Labs Lab 08/25/15 0623 08/27/15 0455 08/28/15 0525 08/29/15 0445 08/30/15 0500  WBC 8.0 10.6* 10.2 10.1 9.3  HGB 10.5* 10.8* 10.8* 11.0* 11.2*  HCT 31.9* 34.2* 33.5* 34.5* 35.5*  MCV 90.6 91.0 92.0 92.7 93.4  PLT 217 252 263 276 297   Cardiac Enzymes: No results for input(s): CKTOTAL, CKMB, CKMBINDEX, TROPONINI in the last 168 hours. BNP (last 3 results) No results for input(s): PROBNP in the last 8760 hours. CBG:  Recent Labs Lab 08/30/15 0847 08/30/15 1121 08/30/15 1651 08/30/15 2055 08/31/15 0728  GLUCAP 141* 137* 189* 190* 125*    No results found for this or any previous visit (from the past 240 hour(s)).       Studies: Dg Chest 2 View  08/29/2015  CLINICAL DATA:  Pneumonia, coughing up copious amounts of blood EXAM: CHEST  2 VIEW COMPARISON:  Radiograph 08/27/2015 FINDINGS: Normal cardiac silhouette. LEFT central venous line unchanged. There is patchy bilateral airspace opacities not significantly changed. No pneumothorax. Continuous osteophytosis of the spine. IMPRESSION: Patchy bilateral airspace opacities concerning for multifocal pneumonia. Pulmonary alveolar hemorrhage or edema could have similar pattern. Electronically Signed   By: Suzy Bouchard M.D.   On: 08/29/2015 14:03        Scheduled Meds: . allopurinol  100 mg Oral BID  . apixaban  5 mg Oral Q12H  . aspirin  81 mg Oral Daily  . benzonatate  200 mg Oral TID  . budesonide (PULMICORT) nebulizer solution  0.5 mg Nebulization BID  . carvedilol  3.125 mg Oral BID WC  . feeding supplement (ENSURE ENLIVE)  237 mL Oral BID BM  .  feeding supplement (PRO-STAT SUGAR FREE 64)  30 mL Oral TID  . furosemide  40 mg Oral BID  . gabapentin  600 mg Oral TID  . insulin aspart  0-5 Units Subcutaneous QHS  . insulin aspart  0-9 Units Subcutaneous TID WC  . insulin glargine  10 Units Subcutaneous QHS  . ipratropium-albuterol  3 mL Nebulization QID  . pantoprazole  40 mg Oral Daily  . potassium chloride  10 mEq Oral BID  . simvastatin  20 mg Oral Daily  . sodium chloride  3 mL Intravenous Q12H  . sodium chloride  3 mL Intravenous Q12H   Continuous Infusions: . sodium chloride      Principal Problem:   Sepsis due to pneumonia [facility]) Active Problems:   Diabetes mellitus type 2, controlled ([place])   HTN (hypertension)   Hyperlipidemia   Diabetic neuropathy ([place])   Coronary artery disease involving native coronary artery of native heart without angina pectoris   Obesity   Obstructive sleep apnea   AKI (acute kidney injury) ([place])   Metabolic  encephalopathy   Thrombocytopenia (HCC)   Acute respiratory failure with hypoxia (HCC)   Hard of hearing   Chronic sinus bradycardia   CAP (community acquired pneumonia)   Acute pulmonary edema (HCC)   Septic shock (HCC)   Acute respiratory failure with hypoxemia (HCC)   Systolic CHF, acute on chronic (HCC)   Prolonged QT interval   ARDS (adult respiratory distress syndrome) (HCC)   Renal failure, acute on chronic (HCC)   Acute systolic CHF (congestive heart failure) (HCC)   Persistent atrial fibrillation (HCC)   Severe sepsis (HCC)   Hemoptysis   Paroxysmal atrial fibrillation ([place])    Time spent: 25 minutes    Delfina Redwood, MD [facility]  [email or web address] Password [email or web address] 08/31/2015, 11:10 AM    LOS: 13 days

## 2015-08-31 NOTE — Procedures (Signed)
Pt placed on CPAP with oxygen for the night. Pt is comfortable and tolerating well.

## 2015-08-31 NOTE — Progress Notes (Signed)
Physical Therapy Treatment Patient Details Name: Christopher Holder MRN: [id number] DOB: May 17, 1930 Today's Date: 08/31/2015    History of Present Illness pt presents with PNA and Sepsis.  pt with hx of CAD, COPD, HTN, DM, Neuropathies, and Hearing Loss.    PT Comments    Patient progressing well towards PT goals. Improved ambulation distance today. Continues to demonstrate impaired endurance and DOE with drop in Sp02 to 86% on 3L/min 02. Cues provided for pursed lip breathing. Pt highly motivated to walk and return to PLOF. Goals updated due to improvement in mobility. Will follow acutely.   Follow Up Recommendations  SNF     Equipment Recommendations  None recommended by PT    Recommendations for Other Services       Precautions / Restrictions Precautions Precautions: Fall Precaution Comments: pt very HOH even with Bil Hearing Aides.  Monitor HR and O2 sats Restrictions Weight Bearing Restrictions: No    Mobility  Bed Mobility Overal bed mobility: Needs Assistance Bed Mobility: Sit to Supine       Sit to supine: Min guard   General bed mobility comments: Able to bring BLEs into bed without assist. HOB flat.  Transfers Overall transfer level: Needs assistance Equipment used: Rolling walker (2 wheeled) Transfers: Sit to/from Stand Sit to Stand: Min guard         General transfer comment: Min guard to boost from chair with cues for hand placement/technique. Multiple attempts to stand from low recliner.  Ambulation/Gait Ambulation/Gait assistance: Min guard Ambulation Distance (Feet): 200 Feet Assistive device: Rolling walker (2 wheeled) Gait Pattern/deviations: Step-through pattern;Decreased stride length;Wide base of support   Gait velocity interpretation: Below normal speed for age/gender General Gait Details: Close Min guard for safety. 2 standing rest breaks due to fatigue, SOB. 2/4 dyspnea. Sp02 decreased to 86% on 3L/min 02. Cues for RW  management.   Stairs            Wheelchair Mobility    Modified Rankin (Stroke Patients Only)       Balance Overall balance assessment: Needs assistance Sitting-balance support: Feet supported;No upper extremity supported Sitting balance-Leahy Scale: Good     Standing balance support: During functional activity Standing balance-Leahy Scale: Poor Standing balance comment: Relient on RW.                    Cognition Arousal/Alertness: Awake/alert Behavior During Therapy: WFL for tasks assessed/performed Overall Cognitive Status: Within Functional Limits for tasks assessed                      Exercises General Exercises - Lower Extremity Ankle Circles/Pumps: Both;10 reps;Seated Heel Raises: Both;10 reps;Seated    General Comments        Pertinent Vitals/Pain Pain Assessment: Faces Faces Pain Scale: Hurts a little bit Pain Location: bil feet Pain Descriptors / Indicators: Burning Pain Intervention(s): Monitored during session;Repositioned    Home Living                      Prior Function            PT Goals (current goals can now be found in the care plan section) Progress towards PT goals: Progressing toward goals    Frequency  Min 2X/week    PT Plan Current plan remains appropriate    Co-evaluation             End of Session Equipment Utilized During Treatment: Gait belt;Oxygen Activity Tolerance: Patient  limited by fatigue;Patient tolerated treatment well Patient left: in bed;with call bell/phone within reach;with bed alarm set;with nursing/sitter in room     Time: 1326-1345 PT Time Calculation (min) (ACUTE ONLY): 19 min  Charges:  $Gait Training: 8-22 mins                    G Codes:      Samaira Holzworth A Breydan [place] 08/31/2015, 2:19 PM Wray Kearns, [place], DPT [phone]

## 2015-08-31 NOTE — Progress Notes (Signed)
Name: Christopher Holder MRN: [id number] DOB: 02-03-1930    ADMISSION DATE:  08/18/2015 CONSULTATION DATE:  11/29  REFERRING MD :  Tyrell Antonio   CHIEF COMPLAINT:  Acute respiratory failure, CAP   BRIEF PATIENT DESCRIPTION:  81yo male with hx CAD, asthma v COPD, DM, OSA on CPAP initially admitted 11/29 with sepsis, CAP and acute respiratory failure ultimately requiring intubation 11/29.   Suspect respiratory failure was multifactorial r/t CAP + pulmonary edema v ALI.  He was diuresed, improved and extubated 12/2.  Moved to SDU and to [facility].  On 12/5 developed Afib with RVR, dyspnea and PCCM reconsulted.  Now on floor, improving slowly.     SIGNIFICANT EVENTS: 11/29 intubated. ECHO ef 25% 12/1 extubated 12/2 confused. - Awake and interactive, very hard of hearing. PCCM signed off. Moved to SDU  08/24/15 - PCCM asked to re-evalaute because of ongoing wheezing despite nebs and lasix. Cards has been called. RN at bedside reports mixture of extreme difficulty hearing (aidds not working) and some mild hypoactive delirium. They are not able to get his hearing aid working. No fever or rising wbc - on chart freviewd. On Abx. CXR with bilateral diffused Airspace dz - similar to yesterday but improved from admit  12/616 -cards has cut back on diuresis due to volume contraction on exam and labs. Still wheezing per cards but to me wheeze improved CT with multifocal pneumonia and small bilateral effusion - personally reviewed. On 4L [place] - 98%  STUDIES:  CT chest 12/6>>>Patchy ground-glass airspace opacities most compatible with multi focal pneumonia.   Small bilateral pleural effusions.   SUBJECTIVE:   Feeling much better.  Sitting up in chair.  Denies SOB.  Still some cough with mild intermittent hemoptysis.  C/o constipation.   VITAL SIGNS: Temp:  [97.3 F (36.3 C)-98.7 F (37.1 C)] 97.3 F (36.3 C) (12/12 0542) Pulse Rate:  [49-53] 49 (12/12 0542) Resp:  [17-18] 17 (12/12 0542) BP: (103-121)/(37-74)  121/45 mmHg (12/12 0542) SpO2:  [93 %-96 %] 94 % (12/12 0748) Weight:  [253 lb 11.2 oz (115.078 kg)] 253 lb 11.2 oz (115.078 kg) (12/12 0542)  PHYSICAL EXAMINATION: General:  Chronically ill appearing male, NAD sitting OOB in chair  Neuro:  Awake, alert, appropriate, MAE HEENT:  Mm moist, no JVD  Cardiovascular:  s1s2 irreg Lungs:  resps even non labored on [place], intermittent wet cough, scattered mild exp wheeze  Abdomen:  Round, mildly distended, non tender  Musculoskeletal:  Warm and dry, scant BLE edema    Recent Labs Lab 08/28/15 0525 08/29/15 0445 08/30/15 0500  NA 144 145 144  K 4.1 4.0 4.2  CL 102 99* 100*  CO2 35* 38* 37*  BUN 50* 48* 46*  CREATININE 1.32* 1.23 1.23  GLUCOSE 133* 103* 123*    Recent Labs Lab 08/28/15 0525 08/29/15 0445 08/30/15 0500  HGB 10.8* 11.0* 11.2*  HCT 33.5* 34.5* 35.5*  WBC 10.2 10.1 9.3  PLT 263 276 297   Dg Chest 2 View  08/29/2015  CLINICAL DATA:  Pneumonia, coughing up copious amounts of blood EXAM: CHEST  2 VIEW COMPARISON:  Radiograph 08/27/2015 FINDINGS: Normal cardiac silhouette. LEFT central venous line unchanged. There is patchy bilateral airspace opacities not significantly changed. No pneumothorax. Continuous osteophytosis of the spine. IMPRESSION: Patchy bilateral airspace opacities concerning for multifocal pneumonia. Pulmonary alveolar hemorrhage or edema could have similar pattern. Electronically Signed   By: Suzy Bouchard M.D.   On: 08/29/2015 14:03    Discussion:  81yo male  with acute respiratory failure r/t CAP, ALI v pulmonary edema c/b AFib with RVR.  Intubated 11/29-12/2.  Now improving on floor with diuresis, abx, HR control.   ASSESSMENT / PLAN:  Acute hypoxic respiratory failure in setting CAP c/b ALI v pulmonary edema.  Improving slowly, still mild wheeze.   PLAN -  S/p 10 days abx  F/u CXR today - clearing slowly  Continue gentle diuresis with PO lasix  Supplemental O2 as needed   Hemoptysis -  improving.   PLAN -  Cough suppression  F/u CBC  Continue eliquis for now as long as hemoptysis improving F/u CXR as above   Hx COPD  Hx OSA PLAN -  Continue qhs CPAP  Continue BD   Nickolas Madrid, NP 08/31/2015  10:02 AM Pager: [phone] or [phone]  Attending Note:  81 year old male with CAP and respiratory failure with edema who was being treated in the ICU, developed a-fib and was started on anti-coagulants.  Was transferred out of the ICU to floor and to [facility].  On the floors patient was noted to have hemoptysis.  PCCM called back for hemoptysis.  I reviewed CXR myself, bilateral airspace opacity noted.  Discussed with TRH-MD.  Hemoptysis: with hemoptysis due to coughing and anti-coagulation.  Per patient improving.  - No bronch for now.  - Observation.  - If worsens will consider stopping anti-coag and bronchoscopy.  Pulmonary infiltrate: likely related to previously treated PNA but alveolar hemorrhage is a consideration.  - F/U CXR ordered.  - Continue abx.  OSA by history.  - CPAP at night.  - Titrate O2 for sat of 88-92%.  COPD by history.  - Bronchodilators as ordered.  - IS per RT protocol.  - Ambulation.  PCCM will follow on consultation.  Patient seen and examined, agree with above note.  I dictated the care and orders written for this patient under my direction.  Rush Farmer, MD [phone]

## 2015-09-01 DIAGNOSIS — J449 Chronic obstructive pulmonary disease, unspecified: Secondary | ICD-10-CM

## 2015-09-01 DIAGNOSIS — I10 Essential (primary) hypertension: Secondary | ICD-10-CM

## 2015-09-01 DIAGNOSIS — E0842 Diabetes mellitus due to underlying condition with diabetic polyneuropathy: Secondary | ICD-10-CM

## 2015-09-01 DIAGNOSIS — A419 Sepsis, unspecified organism: Secondary | ICD-10-CM | POA: Insufficient documentation

## 2015-09-01 DIAGNOSIS — J189 Pneumonia, unspecified organism: Secondary | ICD-10-CM | POA: Insufficient documentation

## 2015-09-01 DIAGNOSIS — J9600 Acute respiratory failure, unspecified whether with hypoxia or hypercapnia: Secondary | ICD-10-CM | POA: Insufficient documentation

## 2015-09-01 DIAGNOSIS — E1140 Type 2 diabetes mellitus with diabetic neuropathy, unspecified: Secondary | ICD-10-CM

## 2015-09-01 DIAGNOSIS — R042 Hemoptysis: Secondary | ICD-10-CM | POA: Insufficient documentation

## 2015-09-01 DIAGNOSIS — D696 Thrombocytopenia, unspecified: Secondary | ICD-10-CM

## 2015-09-01 DIAGNOSIS — E785 Hyperlipidemia, unspecified: Secondary | ICD-10-CM

## 2015-09-01 DIAGNOSIS — J45909 Unspecified asthma, uncomplicated: Secondary | ICD-10-CM

## 2015-09-01 LAB — GLUCOSE, CAPILLARY
GLUCOSE-CAPILLARY: 166 mg/dL — AB (ref 65–99)
GLUCOSE-CAPILLARY: 82 mg/dL (ref 65–99)
Glucose-Capillary: 111 mg/dL — ABNORMAL HIGH (ref 65–99)
Glucose-Capillary: 208 mg/dL — ABNORMAL HIGH (ref 65–99)

## 2015-09-01 MED ORDER — POLYETHYLENE GLYCOL 3350 17 G PO PACK
17.0000 g | PACK | Freq: Every day | ORAL | Status: DC
  Administered 2015-09-01 – 2015-09-02 (×2): 17 g via ORAL
  Filled 2015-09-01: qty 1

## 2015-09-01 MED ORDER — TIOTROPIUM BROMIDE MONOHYDRATE 18 MCG IN CAPS
18.0000 ug | ORAL_CAPSULE | Freq: Every day | RESPIRATORY_TRACT | Status: DC
  Administered 2015-09-02: 18 ug via RESPIRATORY_TRACT
  Filled 2015-09-01: qty 5

## 2015-09-01 MED ORDER — ARFORMOTEROL TARTRATE 15 MCG/2ML IN NEBU
15.0000 ug | INHALATION_SOLUTION | Freq: Two times a day (BID) | RESPIRATORY_TRACT | Status: DC
  Administered 2015-09-01 – 2015-09-02 (×2): 15 ug via RESPIRATORY_TRACT
  Filled 2015-09-01 (×2): qty 2

## 2015-09-01 NOTE — Plan of Care (Signed)
Problem: Fluid Volume: Goal: Ability to maintain a balanced intake and output will improve Outcome: Progressing Patient is maintaining his 1500 cc fluid restriction. Patient is using his urinal so that output can be strictly measured.   Problem: Respiratory: Goal: Respiratory status will improve Outcome: Progressing Patient is currently maintaining sats on 4LNC w/ CPAP at night. Patient has not complained of any shortness of breath. Patient is still experiencing some hemoptysis.    Problem: Education: Goal: Ability to demonstrate managment of disease process will improve Outcome: Progressing Patient verbalizes understanding of the need to restrict fluids, measure urine output, and weigh on the standing scale daily.

## 2015-09-01 NOTE — Progress Notes (Signed)
Removed patient's central line per order at 2305. Line was removed with patient flat on his back while he held his breath. Tip of catheter was intact; patient tolerated removal well. Pressure was held for 10 minutes. No evidence of bleeding seen. Will continue to monitor.

## 2015-09-01 NOTE — Progress Notes (Signed)
Utilization review completed.  

## 2015-09-01 NOTE — Progress Notes (Signed)
[facility]                                                                              Patient Demographics  Christopher Holder, is a 81 y.o. male, DOB - 02-01-1930, [id number]  Admit date - 08/18/2015   Admitting Physician Elmarie Shiley, MD  Outpatient Primary MD for the patient is Holder, Christopher Pert  LOS - 37   Chief Complaint  Patient presents with  . Fever      HPI on 08/18/2015 by Ms. Erin Hearing, NP/ Dr. Niel Hummer This is an 81 year old gentleman recently moved here from [facility], has a past medical history of CAD followed by Dr. Marlou Porch and history of asthma versus COPD currently being worked up by Dr. Lamonte Sakai of pulmonology; he also has history of diabetes on oral medications, sleep apnea on Nocturnal CPAP, hypertension, diabetic neuropathy, obesity, remote CAD and dyslipidemia. He is also very hard of hearing and at baseline without hearing aids has 2% hearing with limited improvement of his hearing with hearing aids in place. She presented to the ER after 2 separate falls this morning. Apparently after the first fall EMS was called out but he did not apparently meet requirements for coming to the hospital or he refused. He called them back later after second fall. Upon EMS arrival patient was febrile with a temperature of 101.1 orally and relative hypotension with a BP of 97/37. Patient was given a gram of Tylenol and transported to the hospital. According to the patient's family he had been having diarrhea this morning and had strong smelling urine. She was given normal saline fluids and was somewhat disoriented and noted to be significantly hard of hearing. He was also found to have a small abrasion on his left knee. Obtaining history from the patient is quite difficult giving his hearing issues. He reports transient lower abdominal pain that initially denied to me having any diarrhea. He has a chronic cough which has changed become increased in frequency and more wet  and productive now with rest colored sputum. He is unclear as to when his fever started. He denies emesis.  In the ER patient's rectal temperature was 102.4, respirations were 25, pulse was 60 and he was in sinus rhythm, BP was 92/44, O2 sats were 88% on room air so oxygen was applied and saturations of subsequently increased to 97%. Just on clinical exam he appeared to be septic so sepsis protocol was initiated. Routine labs including blood cultures were obtained. Because of his relative hypotension fluid volume resuscitation was initiated and patient has subsequently received 3 L of fluid in the ER. Actiq acid was elevated at 2.19, patient appeared to have acute renal insufficiency with a BUN of 25 and a creatinine of 1.75 noting a previous baseline in September 2016 of BUN 30 and creatinine 1.48. He had leukocytosis 11,900 with neutrophils 88% and absolute neutrophils 10.5%, he had thrombocytopenia which is new with platelets 143,000, albumin was low at 3, total bilirubin was mildly elevated at 1.7 with normal AST and ALT, glucose was slightly elevated 167. Again during my evaluation of the patient there was no family at the bedside to assist with history  Interim history Patient developed progressive respiratory distress with copious sputum production and was monitored in the ICU for progressive respiratory failure. Patient did need mechanical ventilation and he was successfully extubated on 08/21/2015. 2. She then resumed care. Patient was found to have atrial fibrillation and started on heparin GTT and had cardioversion version on 08/28/2015.   Assessment & Plan   New proximal atrial fibrillation -Status post cardioversion -CHADSVASC 5, was placed on a liquid however discontinued by pulmonology as patient developed hemoptysis -Patient developed bradycardia, Coreg was discontinued on 08/31/2015 -Continues to be in sinus rhythm -Will need follow-up with Dr. Marlou Porch  Acute hypoxic respiratory  failure -Resolved -Thought to be secondary to community-acquired pneumonia with pulmonary edema versus acute lung injury -Patient did require intubation and was extubated successfully on 08/21/2015 -Continue supplemental oxygen  Hemoptysis -Continue to monitor, continue antitussives -PCCM consult is appreciated  Community-acquired pneumonia -Patient received 10 days of antibiotic treatment  COPD/asthma -Improving, weaning steroids -Continue albuterol as needed, Pulmicort, Brovana, Spiriva  Acute on chronic systolic heart failure/cardiomyopathy -Stable -Continue to monitor intake and output, daily weight -Continue Lasix -Echocardiogram 08/22/2015 shows EF of 25-30%  Nonsustained V. Tach -Coreg discontinued due to bradycardia  Acute on chronic kidney disease, stage III -Resolved, continue to monitor BMP  GERD -Continue PPI  Thrombocytopenia -Resolved  Type 2 diabetes mellitus, with neuropathy -Continue insulin sliding scale CBG monitoring and Lantus -Continue gabapentin for neuropathy  Hyperlipidemia -Continue statin  ?Right leg cellulitis -Resolved  Obstructive sleep apnea -CPAP  Code Status: Full  Family Communication: None at bedside.  Disposition Plan: Admitted.  Time Spent in minutes   30 minutes  Procedures  Intubation/extubation Left subclavian catheter DCCV Echocardiogram  Consults   PCCM Cardiology  DVT Prophylaxis  Was on Eliquis, held.   Lab Results  Component Value Date   PLT 297 08/30/2015    Medications  Scheduled Meds: . allopurinol  100 mg Oral BID  . arformoterol  15 mcg Nebulization BID  . benzonatate  200 mg Oral TID  . budesonide (PULMICORT) nebulizer solution  0.5 mg Nebulization BID  . feeding supplement (ENSURE ENLIVE)  237 mL Oral BID BM  . feeding supplement (PRO-STAT SUGAR FREE 64)  30 mL Oral TID  . fluticasone  2 spray Each Nare Daily  . furosemide  40 mg Oral BID  . gabapentin  600 mg Oral TID  . insulin  aspart  0-5 Units Subcutaneous QHS  . insulin aspart  0-9 Units Subcutaneous TID WC  . insulin glargine  10 Units Subcutaneous QHS  . pantoprazole  40 mg Oral Daily  . polyethylene glycol  17 g Oral Daily  . potassium chloride  10 mEq Oral BID  . simvastatin  20 mg Oral Daily  . sodium chloride  3 mL Intravenous Q12H  . sodium chloride  3 mL Intravenous Q12H  . tiotropium  18 mcg Inhalation Daily   Continuous Infusions: . sodium chloride     PRN Meds:.albuterol, guaiFENesin-dextromethorphan, phenol, sodium chloride  Antibiotics    Anti-infectives    Start     Dose/Rate Route Frequency Ordered Stop   08/21/15 1400  cefTAZidime (FORTAZ) 2 g in dextrose 5 % 50 mL IVPB  Status:  Discontinued     2 g 100 mL/hr over 30 Minutes Intravenous 3 times per day 08/21/15 1255 08/26/15 0844   08/21/15 1230  azithromycin (ZITHROMAX) 500 mg in dextrose 5 % 250 mL IVPB  Status:  Discontinued     500 mg 250  mL/hr over 60 Minutes Intravenous Every 24 hours 08/21/15 1217 08/24/15 1146   08/21/15 1200  azithromycin (ZITHROMAX) tablet 500 mg  Status:  Discontinued     500 mg Oral Daily 08/21/15 1026 08/21/15 1217   08/18/15 1200  azithromycin (ZITHROMAX) 500 mg in dextrose 5 % 250 mL IVPB  Status:  Discontinued     500 mg 250 mL/hr over 60 Minutes Intravenous Every 24 hours 08/18/15 1131 08/21/15 1026   08/18/15 1200  cefTRIAXone (ROCEPHIN) 1 g in dextrose 5 % 50 mL IVPB  Status:  Discontinued     1 g 100 mL/hr over 30 Minutes Intravenous Every 24 hours 08/18/15 1131 08/21/15 1255   08/18/15 1045  vancomycin (VANCOCIN) 2,000 mg in sodium chloride 0.9 % 500 mL IVPB  Status:  Discontinued     2,000 mg 250 mL/hr over 120 Minutes Intravenous  Once 08/18/15 1031 08/18/15 1130   08/18/15 1045  piperacillin-tazobactam (ZOSYN) IVPB 3.375 g     3.375 g 100 mL/hr over 30 Minutes Intravenous  Once 08/18/15 1031 08/18/15 1113        Subjective:   Rolinda Roan seen and examined today.  Patient states he's  feeling much better. He denies any chest pain, shortness of breath, abdominal pain at this time. Continues to complain of bloody sputum.  Objective:   Filed Vitals:   08/31/15 2120 09/01/15 0500 09/01/15 0800 09/01/15 0839  BP:  135/49    Pulse:  58    Temp:  97.8 F (36.6 C)    TempSrc:  Oral    Resp:      Height:      Weight:  122.97 kg (271 lb 1.6 oz)    SpO2: 94% 96% 95% 92%    Wt Readings from Last 3 Encounters:  09/01/15 122.97 kg (271 lb 1.6 oz)  08/07/15 127.824 kg (281 lb 12.8 oz)  07/28/15 131.316 kg (289 lb 8 oz)     Intake/Output Summary (Last 24 hours) at 09/01/15 1401 Last data filed at 09/01/15 [id number]  Gross per 24 hour  Intake   1080 ml  Output   1375 ml  Net   -295 ml    Exam  General: Well developed, well nourished, NAD, appears stated age  65: NCAT,mucous membranes moist.   Cardiovascular: S1 S2 auscultated, 2/6 SEM  Respiratory: Clear to auscultation, no wheezing   Abdomen: Soft, nontender, nondistended, + bowel sounds  Extremities: warm dry without cyanosis clubbing or edema  Neuro: AAOx3, nonfocal  Psych: Normal affect and demeanor   Data Review   Micro Results No results found for this or any previous visit (from the past 240 hour(s)).  Radiology Reports Dg Chest 2 View  08/31/2015  CLINICAL DATA:  Shortness of breath with cough and weakness. EXAM: CHEST  2 VIEW COMPARISON:  08/29/2015 FINDINGS: There is a left subclavian catheter with tip in the projection of the SVC. Moderate cardiac enlargement noted. There are bilateral multifocal airspace and interstitial opacities. Small bilateral pleural effusions persist. IMPRESSION: 1. No change in aeration a lungs compared with previous exam. 2. Persistent bilateral pleural effusions. Electronically Signed   By: Kerby Moors M.D.   On: 08/31/2015 14:14   Dg Chest 2 View  08/29/2015  CLINICAL DATA:  Pneumonia, coughing up copious amounts of blood EXAM: CHEST  2 VIEW COMPARISON:   Radiograph 08/27/2015 FINDINGS: Normal cardiac silhouette. LEFT central venous line unchanged. There is patchy bilateral airspace opacities not significantly changed. No pneumothorax. Continuous osteophytosis of the  spine. IMPRESSION: Patchy bilateral airspace opacities concerning for multifocal pneumonia. Pulmonary alveolar hemorrhage or edema could have similar pattern. Electronically Signed   By: Suzy Bouchard M.D.   On: 08/29/2015 14:03   Ct Chest Wo Contrast  08/25/2015  CLINICAL DATA:  81 year old male with pneumonia and respiratory distress EXAM: CT CHEST WITHOUT CONTRAST TECHNIQUE: Multidetector CT imaging of the chest was performed following the standard protocol without IV contrast. COMPARISON:  Chest radiograph dated 08/24/2015 FINDINGS: Evaluation of this exam is limited in the absence of intravenous contrast. Bilateral patchy ground-glass airspace opacity with areas of air bronchogram noted most compatible with multifocal pneumonia. The small bilateral pleural effusions with associated partial compressive atelectasis of the lower lobes. There is no pneumothorax. The central airways are patent. There is mild atherosclerotic calcification of the thoracic aorta. The pulmonary arteries are grossly unremarkable. There is cardiomegaly. No pericardial effusion. There is coronary vascular calcification. No hilar or mediastinal adenopathy. A left subclavian central line with tip in the central SVC. The visualized esophagus and thyroid gland appear grossly unremarkable. There is no axillary adenopathy. The chest wall soft tissues appear unremarkable. There is degenerative changes of the spine. No acute fracture. Cholelithiasis.  The visualized upper abdomen appear unremarkable. IMPRESSION: Patchy ground-glass airspace opacities most compatible with multi focal pneumonia. Clinical correlation and follow-up recommended. Small bilateral pleural effusions. Cardiomegaly. Electronically Signed   By: Anner Crete M.D.   On: 08/25/2015 01:52   Dg Chest Port 1 View  08/27/2015  CLINICAL DATA:  Shortness of breath with exertion. Recent respiratory failure. EXAM: PORTABLE CHEST 1 VIEW COMPARISON:  08/25/2015 chest radiograph and CT chest. FINDINGS: Trachea is midline. Left subclavian central line tip projects over the SVC. Heart size stable. There has been some interval improvement in diffuse patchy bilateral airspace opacification. Residual small left pleural effusion. IMPRESSION: Improving diffuse patchy bilateral airspace opacification and bilateral effusions. Time course suggests improving congestive heart failure. Pneumonia is not excluded. Electronically Signed   By: Lorin Picket M.D.   On: 08/27/2015 14:59   Dg Chest Port 1 View  08/25/2015  CLINICAL DATA:  81 year old male with acute respiratory failure. EXAM: PORTABLE CHEST 1 VIEW COMPARISON:  Chest x-ray 08/24/2015. FINDINGS: There is a left-sided subclavian central venous catheter with tip terminating in the distal superior vena cava. Patchy asymmetrically distributed airspace consolidation throughout the lungs bilaterally (right greater than left), with some sparing of the left upper lobe, similar to the prior study, most compatible with severe multilobar bronchopneumonia. Small left pleural effusion. Pulmonary vasculature is obscured. Heart size appears borderline enlarged. The patient is rotated to the left on today's exam, resulting in distortion of the mediastinal contours and reduced diagnostic sensitivity and specificity for mediastinal pathology. IMPRESSION: 1. Appearance of the chest is very similar to the prior study, and based on comparison with chest CT 08/25/2015, this most likely reflects multilobar bronchopneumonia. Electronically Signed   By: Vinnie Langton M.D.   On: 08/25/2015 07:08   Dg Chest Port 1 View  08/24/2015  CLINICAL DATA:  81 year old male with shortness of breath EXAM: PORTABLE CHEST 1 VIEW COMPARISON:  Radiograph  dated 08/22/2015 FINDINGS: Single-view of the chest demonstrate bilateral patchy airspace opacity, grossly stable or minimally increased compared to the prior study. Small right pleural effusion may be present. There is no pneumothorax. Stable cardiomegaly. Left subclavian central line with tip in stable positioning. The osseous structures are grossly unremarkable. IMPRESSION: Bilateral patchy airspace opacities, grossly stable or minimally increased since the prior  study. Follow-up recommended. Electronically Signed   By: Anner Crete M.D.   On: 08/24/2015 02:42   Dg Chest Port 1 View  08/22/2015  CLINICAL DATA:  Acute respiratory failure with hypoxemia EXAM: PORTABLE CHEST 1 VIEW COMPARISON:  2 days ago FINDINGS: Bilateral airspace opacity has become progressively patchy, more typical of pneumonia. Inflation is stable to improved after tracheal and esophageal extubation. Stable left subclavian central line, tip at the SVC level. Unchanged cardiomegaly and vascular hilar enlargement. IMPRESSION: 1. Similar degree of bilateral pneumonia. 2. Probable superimposed edema. 3. Stable to improved inflation after extubation. Electronically Signed   By: Monte Fantasia M.D.   On: 08/22/2015 08:24   Dg Chest Port 1 View  08/20/2015  CLINICAL DATA:  Endotracheal tube.  Sepsis.  History of CHF. EXAM: PORTABLE CHEST 1 VIEW COMPARISON:  Yesterday FINDINGS: Endotracheal tube tip between the clavicular heads and carina, stable from prior. There is a new orogastric tube which at least reaches the stomach. Left subclavian central line, tip at the SVC level. Unchanged patchy airspace disease of the bilateral chest, with hazy appearance of the left base which may be layering fluid. No evidence of pneumothorax. Stable cardiomegaly and vascular pedicle widening. IMPRESSION: 1. New orogastric tube is in good position. Stable positioning of pre-existing support apparatus. 2. Unchanged diffuse airspace disease, likely pulmonary  edema and pneumonia. Electronically Signed   By: Monte Fantasia M.D.   On: 08/20/2015 07:25   Portable Chest Xray  08/19/2015  CLINICAL DATA:  Acute respiratory failure with hypoxia, intubated patient, coronary artery disease, sepsis, acute renal insufficiency. EXAM: PORTABLE CHEST 1 VIEW COMPARISON:  Portable chest x-ray of August 18, 2015 FINDINGS: The lungs are adequately inflated. The alveolar opacities bilaterally are slightly less conspicuous today. The cardiac silhouette remains enlarged. The pulmonary vascularity is engorged. The endotracheal tube tip lies approximately 3.5 cm above the carina. The left subclavian venous catheter tip projects over the midportion of the SVC. IMPRESSION: Slight interval improvement in the appearance of the pulmonary parenchyma with some resolution of interstitial edema or pneumonia. Significant abnormalities remain. The support tubes are in reasonable position. Electronically Signed   By: David  Martinique M.D.   On: 08/19/2015 07:25   Portable Chest Xray  08/18/2015  CLINICAL DATA:  Central line placement EXAM: PORTABLE CHEST 1 VIEW COMPARISON:  08/18/2015 FINDINGS: Endotracheal tube has been placed with tip 2.6 cm above the carina. Left subclavian central line tip projects over the superior vena cava. No pneumothorax. Extensive bilateral hazy and interstitial opacities suggesting edema. Cardiac enlargement. IMPRESSION: Increased severity of bilateral airspace disease. Support devices as described . Electronically Signed   By: Skipper Cliche M.D.   On: 08/18/2015 16:37   Dg Chest Port 1 View  08/18/2015  CLINICAL DATA:  Fever.  Multiple recent falls. EXAM: PORTABLE CHEST 1 VIEW COMPARISON:  None. FINDINGS: There is moderate interstitial edema with cardiomegaly and pulmonary venous hypertension. The small left effusion. There is no airspace consolidation. No adenopathy. No pneumothorax. No bone lesions. IMPRESSION: Congestive heart failure. No airspace  consolidation. No pneumothorax. Electronically Signed   By: Lowella Grip III M.D.   On: 08/18/2015 09:32   Dg Abd Portable 1v  08/19/2015  CLINICAL DATA:  Orogastric tube placement EXAM: PORTABLE ABDOMEN - 1 VIEW COMPARISON:  None. FINDINGS: Orogastric tube has been placed and projects with tip just to the right of midline. IMPRESSION: Orogastric tube tip projects over the anticipated position of the distal body/ antrum of the stomach. Electronically  Signed   By: Skipper Cliche M.D.   On: 08/19/2015 10:41    CBC  Recent Labs Lab 08/27/15 0455 08/28/15 0525 08/29/15 0445 08/30/15 0500  WBC 10.6* 10.2 10.1 9.3  HGB 10.8* 10.8* 11.0* 11.2*  HCT 34.2* 33.5* 34.5* 35.5*  PLT 252 263 276 297  MCV 91.0 92.0 92.7 93.4  MCH 28.7 29.7 29.6 29.5  MCHC 31.6 32.2 31.9 31.5  RDW 15.7* 16.0* 16.2* 16.3*    Chemistries   Recent Labs Lab 08/26/15 0510 08/27/15 0455 08/28/15 0525 08/29/15 0445 08/30/15 0500  NA 141 141 144 145 144  K 4.3 4.3 4.1 4.0 4.2  CL 102 100* 102 99* 100*  CO2 31 33* 35* 38* 37*  GLUCOSE 223* 229* 133* 103* 123*  BUN 47* 52* 50* 48* 46*  CREATININE 1.33* 1.45* 1.32* 1.23 1.23  CALCIUM 8.4* 8.4* 8.3* 8.3* 8.3*  MG  --   --  2.6* 2.7* 2.7*   ------------------------------------------------------------------------------------------------------------------ estimated creatinine clearance is 61.7 mL/min (by C-G formula based on Cr of 1.23). ------------------------------------------------------------------------------------------------------------------ No results for input(s): HGBA1C in the last 72 hours. ------------------------------------------------------------------------------------------------------------------ No results for input(s): CHOL, HDL, LDLCALC, TRIG, CHOLHDL, LDLDIRECT in the last 72 hours. ------------------------------------------------------------------------------------------------------------------ No results for input(s): TSH, T4TOTAL,  T3FREE, THYROIDAB in the last 72 hours.  Invalid input(s): FREET3 ------------------------------------------------------------------------------------------------------------------ No results for input(s): VITAMINB12, FOLATE, FERRITIN, TIBC, IRON, RETICCTPCT in the last 72 hours.  Coagulation profile No results for input(s): INR, PROTIME in the last 168 hours.  No results for input(s): DDIMER in the last 72 hours.  Cardiac Enzymes No results for input(s): CKMB, TROPONINI, MYOGLOBIN in the last 168 hours.  Invalid input(s): CK ------------------------------------------------------------------------------------------------------------------ Invalid input(s): POCBNP    Brooklen Runquist D.O. on 09/01/2015 at 2:01 PM  Between 7am to 7pm - Pager - [phone]  After 7pm go to [email or web address] - password [email or web address]  And look for the night coverage person covering for me after hours  [facility] Office  [phone]

## 2015-09-01 NOTE — Progress Notes (Signed)
Name: Christopher Holder MRN: [id number] DOB: 02/24/1930    ADMISSION DATE:  08/18/2015 CONSULTATION DATE:  08/18/2015  REFERRING MD :  Tyrell Antonio   CHIEF COMPLAINT:  Acute respiratory failure, CAP   SUBJECTIVE:    Still has cough with sputum and blood mixed in.  Denies chest pain, wheeze, epistaxis.  VITAL SIGNS: Temp:  [97.5 F (36.4 C)-98.2 F (36.8 C)] 97.8 F (36.6 C) (12/13 0500) Pulse Rate:  [51-58] 58 (12/13 0500) Resp:  [18-20] 20 (12/12 2023) BP: (88-135)/(29-49) 135/49 mmHg (12/13 0500) SpO2:  [90 %-96 %] 92 % (12/13 0839) Weight:  [271 lb 1.6 oz (122.97 kg)] 271 lb 1.6 oz (122.97 kg) (12/13 0500)  PHYSICAL EXAMINATION: General: pleasant Neuro: normal strength HEENT: no oral lesions, no sinus tenderness Cardiovascular: irregular, 2/6 murmur Lungs: no wheeze Abdomen: soft, non tender Musculoskeletal: no edema   CBC Recent Labs     08/30/15  0500  WBC  9.3  HGB  11.2*  HCT  35.5*  PLT  297    BMET Recent Labs     08/30/15  0500  NA  144  K  4.2  CL  100*  CO2  37*  BUN  46*  CREATININE  1.23  GLUCOSE  123*    Electrolytes Recent Labs     08/30/15  0500  CALCIUM  8.3*  MG  2.7*  PHOS  4.1    Glucose Recent Labs     08/30/15  2055  08/31/15  0728  08/31/15  1105  08/31/15  1631  08/31/15  2107  09/01/15  0709  GLUCAP  190*  125*  137*  142*  181*  111*    Imaging Dg Chest 2 View  08/31/2015  CLINICAL DATA:  Shortness of breath with cough and weakness. EXAM: CHEST  2 VIEW COMPARISON:  08/29/2015 FINDINGS: There is a left subclavian catheter with tip in the projection of the SVC. Moderate cardiac enlargement noted. There are bilateral multifocal airspace and interstitial opacities. Small bilateral pleural effusions persist. IMPRESSION: 1. No change in aeration a lungs compared with previous exam. 2. Persistent bilateral pleural effusions. Electronically Signed   By: Kerby Moors M.D.   On: 08/31/2015 14:14    SIGNIFICANT  EVENTS: 11/29 VDRF 12/01 Extubated 12/02 To SDU 12/05 PCCM asked to re-evaluate persistent wheezing 12/12 PCCM asked to re-evaluate for hemoptysis  STUDIES:  11/29 Echo >> EF 25%, mod AI, PAS 41 mmHg 12/06 CT chest >> patch GGO, small b/l effusions  CULTURES: 11/29 Pneumococcal Ag >> negative 11/29 Blood >> negative 11/29 Sputum >> negative  ANTIBIOTICS: 11/29 Zosyn >> 11/29 11/29 Rocephin >> 12/01 11/29 Zithromax >> 12/04 12/02 Tressie Ellis >> 12/06  DISCUSSION:  81 yo male with acute hypoxic respiratory failure 2nd to sepsis, CAP, ALI, and acute pulmonary edema.  Course complicated by A fib with RVR.  PCCM asked to re-assess 12/12 due to hemoptysis.  He has hx of CAD, COPD with asthma, DM, OSA on CPAP.  ASSESSMENT / PLAN:  Hemoptysis. Plan: - hold eliquis, ASA for now - f/u CXR intermittently - if no improvement, then consider bronchoscopy >> he is very reluctant to do this - cough suppressants  Hx of COPD with asthma. Plan: - pulmicort, brovana, spiriva - prn albuterol - bronchial hygiene  Acute hypoxic respiratory failure 2nd to CAP, ALI, acute pulmonary edema >> completed Abx 12/06. Plan: - adjust oxygen to keep SpO2 90 to 95%  Hx of OSA. Plan: - CPAP qhs   Buena Boehm  Halford Chessman, MD [place] 09/01/2015, 9:44 AM Pager:  [phone] After 3pm call: [phone]

## 2015-09-01 NOTE — Progress Notes (Addendum)
Occupational Therapy Treatment Patient Details Name: Christopher Holder MRN: [id number] DOB: 1930-06-04 Today's Date: 09/01/2015    History of present illness pt presents with PNA and Sepsis.  pt with hx of CAD, COPD, HTN, DM, Neuropathies, and Hearing Loss.   OT comments  Pt progressing. Education provided in session. Continue to recommend SNF. Will continue to follow pt acutely.  Follow Up Recommendations  SNF;Supervision/Assistance - 24 hour    Equipment Recommendations  Other (comment) (defer to next venue)    Recommendations for Other Services      Precautions / Restrictions Precautions Precautions: Fall Precaution Comments: pt very HOH even with bilateral Hearing Aides.  Monitor HR and O2 sats Restrictions Weight Bearing Restrictions: No       Mobility Bed Mobility Overal bed mobility: Needs Assistance Bed Mobility: Supine to Sit;Sit to Supine     Supine to sit: Supervision Sit to supine: Supervision   General bed mobility comments: Pt able to pull himself to [facility] with bed in trendelenburg position.   Transfers Overall transfer level: Needs assistance Equipment used: Rolling walker (2 wheeled) Transfers: Sit to/from Stand Sit to Stand: Min guard         General transfer comment: cues for hand placement.    Balance    No LOB in session. Able to perform functional tasks while standing with UE supported without LOB.                                ADL Overall ADL's : Needs assistance/impaired     Grooming: Wash/dry face;Oral care;Applying deodorant;Set up;Supervision/safety;Sitting;Standing;Brushing hair   Upper Body Bathing: Set up;Supervision/ safety;Standing   Lower Body Bathing: Minimal assistance;Sit to/from stand Lower Body Bathing Details (indicate cue type and reason): washed most of LB body   Upper Body Dressing Details (indicate cue type and reason): OT assisted pt with gown-pt with lines Lower Body Dressing: Moderate  assistance;Sitting/lateral leans Lower Body Dressing Details (indicate cue type and reason): doffed socks Toilet Transfer: Min guard;Ambulation;RW (sit to stand from chair)           Functional mobility during ADLs: Min guard;Rolling walker General ADL Comments: Educated on energy conservation and deep breathing technique. Discussed AE. Pt able to reach Rt foot to doff sock and wash foot and OT helped with left foot, but pt reports he can get to left foot by putting it up on bed on the left side.       Vision                     Perception     Praxis      Cognition  Awake/Alert Behavior During Therapy: WFL for tasks assessed/performed Overall Cognitive Status: Within Functional Limits for tasks assessed                       Extremity/Trunk Assessment               Exercises     Shoulder Instructions       General Comments      Pertinent Vitals/ Pain       Pain Assessment: 0-10 Pain Score: 9  Pain Location: right toe when squeezing it Pain Intervention(s): Monitored during session   Pt on a little over 2L of O2 in session and O2 sats dropped in 70s-80s at times in session according to pulse oximeter, but unsure of accuracy at times.  O2 trended up to 90s.   Home Living                                          Prior Functioning/Environment              Frequency Min 2X/week     Progress Toward Goals  OT Goals(current goals can now be found in the care plan section)  Progress towards OT goals: Progressing toward goals-updated a goal  Acute Rehab OT Goals Patient Stated Goal: not stated OT Goal Formulation: Patient unable to participate in goal setting Time For Goal Achievement: 09/04/15 Potential to Achieve Goals: Good ADL Goals  Plan Discharge plan remains appropriate    Co-evaluation                 End of Session Equipment Utilized During Treatment: Gait belt;Rolling walker;Oxygen   Activity  Tolerance Patient tolerated treatment well   Patient Left in bed;with call bell/phone within reach   Nurse Communication Other (comment) (bathed in session;bed alarm looks broken; O2 sats)        Time: [id number] OT Time Calculation (min): 31 min  Charges: OT General Charges $OT Visit: 1 Procedure OT Treatments $Self Care/Home Management : 23-37 mins  Benito Mccreedy OTR/L [id number] 09/01/2015, 2:41 PM

## 2015-09-02 DIAGNOSIS — G9341 Metabolic encephalopathy: Secondary | ICD-10-CM

## 2015-09-02 LAB — BASIC METABOLIC PANEL
Anion gap: 6 (ref 5–15)
BUN: 31 mg/dL — ABNORMAL HIGH (ref 6–20)
CALCIUM: 8.1 mg/dL — AB (ref 8.9–10.3)
CO2: 34 mmol/L — AB (ref 22–32)
CREATININE: 1.4 mg/dL — AB (ref 0.61–1.24)
Chloride: 100 mmol/L — ABNORMAL LOW (ref 101–111)
GFR calc Af Amer: 52 mL/min — ABNORMAL LOW (ref >60)
GFR calc non Af Amer: 45 mL/min — ABNORMAL LOW (ref >60)
GLUCOSE: 110 mg/dL — AB (ref 65–99)
Potassium: 4.9 mmol/L (ref 3.5–5.1)
Sodium: 140 mmol/L (ref 135–145)

## 2015-09-02 LAB — CBC
HEMATOCRIT: 33.8 % — AB (ref 39.0–52.0)
Hemoglobin: 10.6 g/dL — ABNORMAL LOW (ref 13.0–17.0)
MCH: 29 pg (ref 26.0–34.0)
MCHC: 31.4 g/dL (ref 30.0–36.0)
MCV: 92.6 fL (ref 78.0–100.0)
Platelets: 239 10*3/uL (ref 150–400)
RBC: 3.65 MIL/uL — ABNORMAL LOW (ref 4.22–5.81)
RDW: 16.3 % — AB (ref 11.5–15.5)
WBC: 5.6 10*3/uL (ref 4.0–10.5)

## 2015-09-02 LAB — GLUCOSE, CAPILLARY
GLUCOSE-CAPILLARY: 166 mg/dL — AB (ref 65–99)
Glucose-Capillary: 110 mg/dL — ABNORMAL HIGH (ref 65–99)

## 2015-09-02 MED ORDER — BUDESONIDE 0.5 MG/2ML IN SUSP: 0.5000 mg | Freq: Two times a day (BID) | RESPIRATORY_TRACT | Status: DC

## 2015-09-02 MED ORDER — GUAIFENESIN-DM 100-10 MG/5ML PO SYRP: 5.0000 mL | ORAL_SOLUTION | ORAL | Status: DC | PRN

## 2015-09-02 MED ORDER — BENZONATATE 200 MG PO CAPS: 200.0000 mg | ORAL_CAPSULE | Freq: Three times a day (TID) | ORAL | Status: DC

## 2015-09-02 MED ORDER — PRO-STAT SUGAR FREE PO LIQD: 30.0000 mL | Freq: Three times a day (TID) | ORAL | Status: DC

## 2015-09-02 MED ORDER — APIXABAN 5 MG PO TABS: 5.0000 mg | ORAL_TABLET | Freq: Two times a day (BID) | ORAL | Status: DC

## 2015-09-02 MED ORDER — ENSURE ENLIVE PO LIQD: 237.0000 mL | Freq: Two times a day (BID) | ORAL | Status: DC

## 2015-09-02 MED ORDER — TIOTROPIUM BROMIDE MONOHYDRATE 18 MCG IN CAPS: 18.0000 ug | ORAL_CAPSULE | Freq: Every day | RESPIRATORY_TRACT | Status: DC

## 2015-09-02 MED ORDER — PHENOL 1.4 % MT LIQD: 1.0000 | OROMUCOSAL | Status: DC | PRN

## 2015-09-02 MED ORDER — FUROSEMIDE 40 MG PO TABS: 40.0000 mg | ORAL_TABLET | Freq: Two times a day (BID) | ORAL | Status: DC

## 2015-09-02 MED ORDER — ARFORMOTEROL TARTRATE 15 MCG/2ML IN NEBU: 15.0000 ug | INHALATION_SOLUTION | Freq: Two times a day (BID) | RESPIRATORY_TRACT | Status: DC

## 2015-09-02 MED ORDER — POLYETHYLENE GLYCOL 3350 17 G PO PACK: 17.0000 g | PACK | Freq: Every day | ORAL | Status: DC | PRN

## 2015-09-02 NOTE — Progress Notes (Signed)
Physical Therapy Treatment Patient Details Name: Christopher Holder MRN: [id number] DOB: March 22, 1930 Today's Date: 09/02/2015    History of Present Illness pt presents with PNA and Sepsis.  pt with hx of CAD, COPD, HTN, DM, Neuropathies, and Hearing Loss.    PT Comments    Christopher Holder ambulated 200 ft w/ min guard assist, requiring 3 LPM O2 to maintain SpO2 above 89%.  Pt will benefit from continued skilled PT services to increase functional independence and safety.  Follow Up Recommendations  SNF;Supervision for mobility/OOB     Equipment Recommendations  None recommended by PT    Recommendations for Other Services       Precautions / Restrictions Precautions Precautions: Fall Precaution Comments: pt very HOH even with Bil Hearing Aides.  Monitor HR and O2 sats Restrictions Weight Bearing Restrictions: No    Mobility  Bed Mobility Overal bed mobility: Needs Assistance Bed Mobility: Supine to Sit     Supine to sit: Supervision     General bed mobility comments: Heavy use of bed rail and HOB elevated.  Increased time.  Instructed on proper breathing technique as pt demonstrates valsalva maneuver.  Transfers Overall transfer level: Needs assistance Equipment used: Rolling walker (2 wheeled) Transfers: Sit to/from Stand Sit to Stand: Min guard         General transfer comment: Min guard for pt's safety.  Pt w/ safe technique and hand placement.    Ambulation/Gait Ambulation/Gait assistance: Min guard Ambulation Distance (Feet): 200 Feet Assistive device: Rolling walker (2 wheeled) Gait Pattern/deviations: Step-through pattern;Decreased stride length;Trunk flexed   Gait velocity interpretation: Below normal speed for age/gender General Gait Details: Close Min guard for safety. 2 standing rest breaks due to fatigue, SOB. 2/4 dyspnea. Sp02 decreased to 82% on 2L/min 02 but remained above 89% on 3LPM O2.   Stairs            Wheelchair Mobility    Modified Rankin  (Stroke Patients Only)       Balance Overall balance assessment: Needs assistance Sitting-balance support: Feet supported Sitting balance-Leahy Scale: Good     Standing balance support: During functional activity;Bilateral upper extremity supported Standing balance-Leahy Scale: Poor Standing balance comment: Relies on RW for support                    Cognition Arousal/Alertness: Awake/alert Behavior During Therapy: WFL for tasks assessed/performed Overall Cognitive Status: Within Functional Limits for tasks assessed                      Exercises General Exercises - Lower Extremity Ankle Circles/Pumps: Both;10 reps;Seated;AROM Long Arc Quad: AROM;Both;10 reps;Seated Hip Flexion/Marching: AROM;Both;10 reps;Seated    General Comments        Pertinent Vitals/Pain Pain Assessment: No/denies pain Pain Intervention(s): Limited activity within patient's tolerance;Monitored during session    Home Living                      Prior Function            PT Goals (current goals can now be found in the care plan section) Acute Rehab PT Goals Patient Stated Goal: not stated Progress towards PT goals: Progressing toward goals    Frequency  Min 2X/week    PT Plan Current plan remains appropriate    Co-evaluation             End of Session Equipment Utilized During Treatment: Gait belt;Oxygen Activity Tolerance: Patient limited by fatigue;Patient  tolerated treatment well Patient left: in chair;with call bell/phone within reach;with chair alarm set     Time: 1203-1218 PT Time Calculation (min) (ACUTE ONLY): 15 min  Charges:  $Gait Training: 8-22 mins                    G Codes:      Joslyn Hy PT, [place] [id number] Pager: [phone] 09/02/2015, 12:47 PM

## 2015-09-02 NOTE — Discharge Summary (Signed)
Physician Discharge Summary  Christopher Holder [id number] DOB: [birth year] DOA: 08/18/2015  PCP: Mackie Pai, PA-C  Admit date: 08/18/2015 Discharge date: 09/02/2015  Time spent: 45 minutes  Recommendations for Outpatient Follow-up:  Patient will be discharged to skilled nursing facility.  Continue physical and occupational therapy as recommended by the facility.  Patient will need to follow up with primary care provider within one week of discharge and have repeat CBC and BMP.  Follow up with Dr. Marlou Porch, cardiology.  Patient may restart Eliquis 24hours after he is no longer experiencing blood with his cough- this could be 09/03/2015.  Patient should continue medications as prescribed.  Patient should follow a heart healthy/carb modified diet.   Discharge Diagnoses:  Paroxysmal atrial fibrillation Acute hypoxic respiratory failure Hemoptysis Community-acquired pneumonia COPD/asthma Acute on chronic systolic heart failure/cardiomyopathy Nonsustained V. tach Acute on chronic kidney disease, stage III Thrombocytopenia GERD Type 2 diabetes mellitus with neuropathy Hyperlipidemia Right leg cellulitis Obstructive sleep apnea  Discharge Condition: Stable  Diet recommendation: Heart healthy/ Carb modified  Filed Weights   08/31/15 0542 09/01/15 0500 09/02/15 0600  Weight: 115.078 kg (253 lb 11.2 oz) 122.97 kg (271 lb 1.6 oz) 121.564 kg (268 lb)    History of present illness:  on 08/18/2015 by Ms. Erin Hearing, NP/ Dr. Niel Hummer This is an 81 year old gentleman recently moved here from [facility], has a past medical history of CAD followed by Dr. Marlou Porch and history of asthma versus COPD currently being worked up by Dr. Lamonte Sakai of pulmonology; he also has history of diabetes on oral medications, sleep apnea on Nocturnal CPAP, hypertension, diabetic neuropathy, obesity, remote CAD and dyslipidemia. He is also very hard of hearing and at baseline without hearing aids has 2% hearing  with limited improvement of his hearing with hearing aids in place. She presented to the ER after 2 separate falls this morning. Apparently after the first fall EMS was called out but he did not apparently meet requirements for coming to the hospital or he refused. He called them back later after second fall. Upon EMS arrival patient was febrile with a temperature of 101.1 orally and relative hypotension with a BP of 97/37. Patient was given a gram of Tylenol and transported to the hospital. According to the patient's family he had been having diarrhea this morning and had strong smelling urine. She was given normal saline fluids and was somewhat disoriented and noted to be significantly hard of hearing. He was also found to have a small abrasion on his left knee. Obtaining history from the patient is quite difficult giving his hearing issues. He reports transient lower abdominal pain that initially denied to me having any diarrhea. He has a chronic cough which has changed become increased in frequency and more wet and productive now with rest colored sputum. He is unclear as to when his fever started. He denies emesis.  In the ER patient's rectal temperature was 102.4, respirations were 25, pulse was 60 and he was in sinus rhythm, BP was 92/44, O2 sats were 88% on room air so oxygen was applied and saturations of subsequently increased to 97%. Just on clinical exam he appeared to be septic so sepsis protocol was initiated. Routine labs including blood cultures were obtained. Because of his relative hypotension fluid volume resuscitation was initiated and patient has subsequently received 3 L of fluid in the ER. Actiq acid was elevated at 2.19, patient appeared to have acute renal insufficiency with a BUN of 25 and a creatinine of 1.75  noting a previous baseline in September 2016 of BUN 30 and creatinine 1.48. He had leukocytosis 11,900 with neutrophils 88% and absolute neutrophils 10.5%, he had thrombocytopenia  which is new with platelets 143,000, albumin was low at 3, total bilirubin was mildly elevated at 1.7 with normal AST and ALT, glucose was slightly elevated 167. Again during my evaluation of the patient there was no family at the bedside to assist with history  Hospital Course:  Interim history Patient developed progressive respiratory distress with copious sputum production and was monitored in the ICU for progressive respiratory failure. Patient did need mechanical ventilation and he was successfully extubated on 08/21/2015. 2. She then resumed care. Patient was found to have atrial fibrillation and started on heparin GTT and had cardioversion version on 08/28/2015.  New proximal atrial fibrillation -Status post cardioversion -CHADSVASC 5, was placed on a liquid however discontinued by pulmonology as patient developed hemoptysis -Patient developed bradycardia, Coreg was discontinued on 08/31/2015 -Continues to be in sinus rhythm -Will need follow-up with Dr. Marlou Porch  Acute hypoxic respiratory failure -Resolved -Thought to be secondary to community-acquired pneumonia with pulmonary edema versus acute lung injury -Patient did require intubation and was extubated successfully on 08/21/2015 -Continue supplemental oxygen  Hemoptysis -Continue to monitor, continue antitussives -possibly secondary to airway friability  -PCCM consult is appreciated and recommended to continue holding Eliquis until patient has had 24 hours of no further hemoptysis. -Viewed sputum today, no blood noted -If patient is restarted on Eliquis and develops hemoptyis again, may follow up with pulmonology as an outpatient.  Community-acquired pneumonia -Patient received 10 days of antibiotic treatment  COPD/asthma -Improving, weaning steroids -Continue albuterol as needed, Pulmicort, Brovana, Spiriva  Acute on chronic systolic heart failure/cardiomyopathy -Stable -Continue to monitor intake and output, daily  weight -Continue Lasix -Echocardiogram 08/22/2015 shows EF of 25-30% -No ACEI due to CKD  Nonsustained V. Tach -Coreg discontinued due to bradycardia  Acute on chronic kidney disease, stage III -Resolved, continue to monitor BMP  GERD -Continue PPI  Thrombocytopenia -Resolved  Type 2 diabetes mellitus, with neuropathy -Continue insulin sliding scale CBG monitoring and Lantus -Continue gabapentin for neuropathy  Hyperlipidemia -Continue statin  ?Right leg cellulitis -Resolved  Obstructive sleep apnea -CPAP  Procedures  Intubation/extubation Left subclavian catheter DCCV Echocardiogram  Consults  PCCM Cardiology  Discharge Exam: Filed Vitals:   09/01/15 2206 09/02/15 0600  BP:  116/44  Pulse: 61   Temp:  97.8 F (36.6 C)  Resp: 17 16   Exam  General: Well developed, well nourished, NAD, appears stated age  HEENT: NCAT,mucous membranes moist.   Cardiovascular: S1 S2 auscultated, 2/6 SEM  Respiratory: Clear to auscultation, no wheezing  Abdomen: Soft, nontender, nondistended, + bowel sounds  Extremities: warm dry without cyanosis clubbing or edema  Neuro: AAOx3, nonfocal  Psych: Normal affect and demeanor, pleasant  Discharge Instructions      Discharge Instructions    Discharge instructions    Complete by:  As directed   Patient will be discharged to skilled nursing facility.  Continue physical and occupational therapy as recommended by the facility.  Patient will need to follow up with primary care provider within one week of discharge and have repeat CBC and BMP.  Follow up with Dr. Marlou Porch, cardiology.  Patient may restart Eliquis 24hours after he is no longer experiencing blood with his cough- this could be 09/03/2015.  Patient should continue medications as prescribed.  Patient should follow a heart healthy/carb modified diet.  Medication List    STOP taking these medications        bumetanide 2 MG tablet  Commonly  known as:  BUMEX     carvedilol 6.25 MG tablet  Commonly known as:  COREG     ramipril 2.5 MG capsule  Commonly known as:  ALTACE      TAKE these medications        albuterol 108 (90 BASE) MCG/ACT inhaler  Commonly known as:  PROVENTIL HFA;VENTOLIN HFA  Inhale 2 puffs into the lungs every 4 (four) hours as needed for wheezing or shortness of breath.     allopurinol 100 MG tablet  Commonly known as:  ZYLOPRIM  Take 100 mg by mouth 2 (two) times daily.     arformoterol 15 MCG/2ML Nebu  Commonly known as:  BROVANA  Take 2 mLs (15 mcg total) by nebulization 2 (two) times daily.     aspirin 81 MG tablet  Take 81 mg by mouth daily.     benzonatate 200 MG capsule  Commonly known as:  TESSALON  Take 1 capsule (200 mg total) by mouth 3 (three) times daily.     budesonide 0.5 MG/2ML nebulizer solution  Commonly known as:  PULMICORT  Take 2 mLs (0.5 mg total) by nebulization 2 (two) times daily.     feeding supplement (ENSURE ENLIVE) Liqd  Take 237 mLs by mouth 2 (two) times daily between meals.     feeding supplement (PRO-STAT SUGAR FREE 64) Liqd  Take 30 mLs by mouth 3 (three) times daily.     fluticasone 50 MCG/ACT nasal spray  Commonly known as:  FLONASE  Place 2 sprays into both nostrils daily.     furosemide 40 MG tablet  Commonly known as:  LASIX  Take 1 tablet (40 mg total) by mouth 2 (two) times daily.     gabapentin 600 MG tablet  Commonly known as:  NEURONTIN  1 tab po tid     glimepiride 2 MG tablet  Commonly known as:  AMARYL  Take 1 tablet (2 mg total) by mouth daily.     guaiFENesin-dextromethorphan 100-10 MG/5ML syrup  Commonly known as:  ROBITUSSIN DM  Take 5 mLs by mouth every 4 (four) hours as needed for cough.     mometasone-formoterol 200-5 MCG/ACT Aero  Commonly known as:  DULERA  Inhale 2 puffs into the lungs 2 (two) times daily.     montelukast 10 MG tablet  Commonly known as:  SINGULAIR  Take 1 tablet (10 mg total) by mouth at bedtime.       NON FORMULARY  Take 15 each by mouth Nightly. CPAP at night     omeprazole 20 MG capsule  Commonly known as:  PRILOSEC  Take 1 capsule (20 mg total) by mouth daily.     phenol 1.4 % Liqd  Commonly known as:  CHLORASEPTIC  Use as directed 1 spray in the mouth or throat as needed for throat irritation / pain.     polyethylene glycol packet  Commonly known as:  MIRALAX / GLYCOLAX  Take 17 g by mouth daily as needed.     potassium chloride 10 MEQ tablet  Commonly known as:  K-DUR,KLOR-CON  Take 10 mEq by mouth 2 (two) times daily.     simvastatin 20 MG tablet  Commonly known as:  ZOCOR  Take 20 mg by mouth daily.     tiotropium 18 MCG inhalation capsule  Commonly known as:  [place] 1  capsule (18 mcg total) into inhaler and inhale daily.       Allergies  Allergen Reactions  . Percocet [Oxycodone-Acetaminophen] Diarrhea  . Sulfa Antibiotics Diarrhea and Nausea And Vomiting   Follow-up Information    Follow up with [name], Percell Miller, PA-C.   Specialties:  Internal Medicine, Family Medicine   Contact information:   [address] [place] [address] [phone]       Follow up with Candee Furbish, MD In 2 weeks.   Specialty:  Cardiology   Why:  Hospital follow up, A fib   Contact information:   [address] [place] [address] [phone]       Go to Chesley Mires, MD.   Specialty:  Pulmonary Disease   Why:  As needed- if blood sputum returns   Contact information:   [address] [place] [address] [phone]        The results of significant diagnostics from this hospitalization (including imaging, microbiology, ancillary and laboratory) are listed below for reference.    Significant Diagnostic Studies: Dg Chest 2 View  08/31/2015  CLINICAL DATA:  Shortness of breath with cough and weakness. EXAM: CHEST  2 VIEW COMPARISON:  08/29/2015 FINDINGS: There is a left subclavian catheter with tip in the  projection of the SVC. Moderate cardiac enlargement noted. There are bilateral multifocal airspace and interstitial opacities. Small bilateral pleural effusions persist. IMPRESSION: 1. No change in aeration a lungs compared with previous exam. 2. Persistent bilateral pleural effusions. Electronically Signed   By: Kerby Moors M.D.   On: 08/31/2015 14:14   Dg Chest 2 View  08/29/2015  CLINICAL DATA:  Pneumonia, coughing up copious amounts of blood EXAM: CHEST  2 VIEW COMPARISON:  Radiograph 08/27/2015 FINDINGS: Normal cardiac silhouette. LEFT central venous line unchanged. There is patchy bilateral airspace opacities not significantly changed. No pneumothorax. Continuous osteophytosis of the spine. IMPRESSION: Patchy bilateral airspace opacities concerning for multifocal pneumonia. Pulmonary alveolar hemorrhage or edema could have similar pattern. Electronically Signed   By: Suzy Bouchard M.D.   On: 08/29/2015 14:03   Ct Chest Wo Contrast  08/25/2015  CLINICAL DATA:  81 year old male with pneumonia and respiratory distress EXAM: CT CHEST WITHOUT CONTRAST TECHNIQUE: Multidetector CT imaging of the chest was performed following the standard protocol without IV contrast. COMPARISON:  Chest radiograph dated 08/24/2015 FINDINGS: Evaluation of this exam is limited in the absence of intravenous contrast. Bilateral patchy ground-glass airspace opacity with areas of air bronchogram noted most compatible with multifocal pneumonia. The small bilateral pleural effusions with associated partial compressive atelectasis of the lower lobes. There is no pneumothorax. The central airways are patent. There is mild atherosclerotic calcification of the thoracic aorta. The pulmonary arteries are grossly unremarkable. There is cardiomegaly. No pericardial effusion. There is coronary vascular calcification. No hilar or mediastinal adenopathy. A left subclavian central line with tip in the central SVC. The visualized esophagus  and thyroid gland appear grossly unremarkable. There is no axillary adenopathy. The chest wall soft tissues appear unremarkable. There is degenerative changes of the spine. No acute fracture. Cholelithiasis.  The visualized upper abdomen appear unremarkable. IMPRESSION: Patchy ground-glass airspace opacities most compatible with multi focal pneumonia. Clinical correlation and follow-up recommended. Small bilateral pleural effusions. Cardiomegaly. Electronically Signed   By: Anner Crete M.D.   On: 08/25/2015 01:52   Dg Chest Port 1 View  08/27/2015  CLINICAL DATA:  Shortness of breath with exertion. Recent respiratory failure. EXAM: PORTABLE  CHEST 1 VIEW COMPARISON:  08/25/2015 chest radiograph and CT chest. FINDINGS: Trachea is midline. Left subclavian central line tip projects over the SVC. Heart size stable. There has been some interval improvement in diffuse patchy bilateral airspace opacification. Residual small left pleural effusion. IMPRESSION: Improving diffuse patchy bilateral airspace opacification and bilateral effusions. Time course suggests improving congestive heart failure. Pneumonia is not excluded. Electronically Signed   By: Lorin Picket M.D.   On: 08/27/2015 14:59   Dg Chest Port 1 View  08/25/2015  CLINICAL DATA:  81 year old male with acute respiratory failure. EXAM: PORTABLE CHEST 1 VIEW COMPARISON:  Chest x-ray 08/24/2015. FINDINGS: There is a left-sided subclavian central venous catheter with tip terminating in the distal superior vena cava. Patchy asymmetrically distributed airspace consolidation throughout the lungs bilaterally (right greater than left), with some sparing of the left upper lobe, similar to the prior study, most compatible with severe multilobar bronchopneumonia. Small left pleural effusion. Pulmonary vasculature is obscured. Heart size appears borderline enlarged. The patient is rotated to the left on today's exam, resulting in distortion of the mediastinal  contours and reduced diagnostic sensitivity and specificity for mediastinal pathology. IMPRESSION: 1. Appearance of the chest is very similar to the prior study, and based on comparison with chest CT 08/25/2015, this most likely reflects multilobar bronchopneumonia. Electronically Signed   By: Vinnie Langton M.D.   On: 08/25/2015 07:08   Dg Chest Port 1 View  08/24/2015  CLINICAL DATA:  81 year old male with shortness of breath EXAM: PORTABLE CHEST 1 VIEW COMPARISON:  Radiograph dated 08/22/2015 FINDINGS: Single-view of the chest demonstrate bilateral patchy airspace opacity, grossly stable or minimally increased compared to the prior study. Small right pleural effusion may be present. There is no pneumothorax. Stable cardiomegaly. Left subclavian central line with tip in stable positioning. The osseous structures are grossly unremarkable. IMPRESSION: Bilateral patchy airspace opacities, grossly stable or minimally increased since the prior study. Follow-up recommended. Electronically Signed   By: Anner Crete M.D.   On: 08/24/2015 02:42   Dg Chest Port 1 View  08/22/2015  CLINICAL DATA:  Acute respiratory failure with hypoxemia EXAM: PORTABLE CHEST 1 VIEW COMPARISON:  2 days ago FINDINGS: Bilateral airspace opacity has become progressively patchy, more typical of pneumonia. Inflation is stable to improved after tracheal and esophageal extubation. Stable left subclavian central line, tip at the SVC level. Unchanged cardiomegaly and vascular hilar enlargement. IMPRESSION: 1. Similar degree of bilateral pneumonia. 2. Probable superimposed edema. 3. Stable to improved inflation after extubation. Electronically Signed   By: Monte Fantasia M.D.   On: 08/22/2015 08:24   Dg Chest Port 1 View  08/20/2015  CLINICAL DATA:  Endotracheal tube.  Sepsis.  History of CHF. EXAM: PORTABLE CHEST 1 VIEW COMPARISON:  Yesterday FINDINGS: Endotracheal tube tip between the clavicular heads and carina, stable from prior.  There is a new orogastric tube which at least reaches the stomach. Left subclavian central line, tip at the SVC level. Unchanged patchy airspace disease of the bilateral chest, with hazy appearance of the left base which may be layering fluid. No evidence of pneumothorax. Stable cardiomegaly and vascular pedicle widening. IMPRESSION: 1. New orogastric tube is in good position. Stable positioning of pre-existing support apparatus. 2. Unchanged diffuse airspace disease, likely pulmonary edema and pneumonia. Electronically Signed   By: Monte Fantasia M.D.   On: 08/20/2015 07:25   Portable Chest Xray  08/19/2015  CLINICAL DATA:  Acute respiratory failure with hypoxia, intubated patient, coronary artery disease, sepsis, acute  renal insufficiency. EXAM: PORTABLE CHEST 1 VIEW COMPARISON:  Portable chest x-ray of August 18, 2015 FINDINGS: The lungs are adequately inflated. The alveolar opacities bilaterally are slightly less conspicuous today. The cardiac silhouette remains enlarged. The pulmonary vascularity is engorged. The endotracheal tube tip lies approximately 3.5 cm above the carina. The left subclavian venous catheter tip projects over the midportion of the SVC. IMPRESSION: Slight interval improvement in the appearance of the pulmonary parenchyma with some resolution of interstitial edema or pneumonia. Significant abnormalities remain. The support tubes are in reasonable position. Electronically Signed   By: David  Martinique M.D.   On: 08/19/2015 07:25   Portable Chest Xray  08/18/2015  CLINICAL DATA:  Central line placement EXAM: PORTABLE CHEST 1 VIEW COMPARISON:  08/18/2015 FINDINGS: Endotracheal tube has been placed with tip 2.6 cm above the carina. Left subclavian central line tip projects over the superior vena cava. No pneumothorax. Extensive bilateral hazy and interstitial opacities suggesting edema. Cardiac enlargement. IMPRESSION: Increased severity of bilateral airspace disease. Support devices as  described . Electronically Signed   By: Skipper Cliche M.D.   On: 08/18/2015 16:37   Dg Chest Port 1 View  08/18/2015  CLINICAL DATA:  Fever.  Multiple recent falls. EXAM: PORTABLE CHEST 1 VIEW COMPARISON:  None. FINDINGS: There is moderate interstitial edema with cardiomegaly and pulmonary venous hypertension. The small left effusion. There is no airspace consolidation. No adenopathy. No pneumothorax. No bone lesions. IMPRESSION: Congestive heart failure. No airspace consolidation. No pneumothorax. Electronically Signed   By: Lowella Grip III M.D.   On: 08/18/2015 09:32   Dg Abd Portable 1v  08/19/2015  CLINICAL DATA:  Orogastric tube placement EXAM: PORTABLE ABDOMEN - 1 VIEW COMPARISON:  None. FINDINGS: Orogastric tube has been placed and projects with tip just to the right of midline. IMPRESSION: Orogastric tube tip projects over the anticipated position of the distal body/ antrum of the stomach. Electronically Signed   By: Skipper Cliche M.D.   On: 08/19/2015 10:41    Microbiology: No results found for this or any previous visit (from the past 240 hour(s)).   Labs: Basic Metabolic Panel:  Recent Labs Lab 08/27/15 0455 08/28/15 0525 08/29/15 0445 08/30/15 0500 09/02/15 0240  NA 141 144 145 144 140  K 4.3 4.1 4.0 4.2 4.9  CL 100* 102 99* 100* 100*  CO2 33* 35* 38* 37* 34*  GLUCOSE 229* 133* 103* 123* 110*  BUN 52* 50* 48* 46* 31*  CREATININE 1.45* 1.32* 1.23 1.23 1.40*  CALCIUM 8.4* 8.3* 8.3* 8.3* 8.1*  MG  --  2.6* 2.7* 2.7*  --   PHOS  --  3.4 3.5 4.1  --    Liver Function Tests: No results for input(s): AST, ALT, ALKPHOS, BILITOT, PROT, ALBUMIN in the last 168 hours. No results for input(s): LIPASE, AMYLASE in the last 168 hours. No results for input(s): AMMONIA in the last 168 hours. CBC:  Recent Labs Lab 08/27/15 0455 08/28/15 0525 08/29/15 0445 08/30/15 0500 09/02/15 0240  WBC 10.6* 10.2 10.1 9.3 5.6  HGB 10.8* 10.8* 11.0* 11.2* 10.6*  HCT 34.2* 33.5*  34.5* 35.5* 33.8*  MCV 91.0 92.0 92.7 93.4 92.6  PLT 252 263 276 297 239   Cardiac Enzymes: No results for input(s): CKTOTAL, CKMB, CKMBINDEX, TROPONINI in the last 168 hours. BNP: BNP (last 3 results)  Recent Labs  08/18/15 1718 08/26/15 0510  BNP 830.0* 1054.2*    ProBNP (last 3 results) No results for input(s): PROBNP in the last 8760  hours.  CBG:  Recent Labs Lab 09/01/15 0709 09/01/15 1154 09/01/15 1648 09/01/15 2120 09/02/15 0733  GLUCAP 111* 166* 82 208* 110*       Signed:  Sherly Brodbeck  [facility] 09/02/2015, 10:52 AM

## 2015-09-02 NOTE — Plan of Care (Signed)
Problem: Education: Goal: Knowledge of disease or condition will improve Outcome: Adequate for Discharge Pt discharging to SNF

## 2015-09-02 NOTE — Plan of Care (Signed)
Problem: Bowel/Gastric: Goal: Will not experience complications related to bowel motility Outcome: Completed/Met Date Met:  09/02/15 After some complaints of constipation yesterday,  patient received mirilax on 12/13 and was able to have a normal bowel movement.   Problem: Respiratory: Goal: Respiratory status will improve Outcome: Progressing Patient is now able to maintain adequate oxygen saturation levels on 2LNC. Patient has had no complaints of shortness of breath. Patient is still having some hemoptysis; eliquis and aspirin on hold for this reason.

## 2015-09-02 NOTE — Progress Notes (Signed)
Name: Christopher Holder MRN: [id number] DOB: 08-16-1930    ADMISSION DATE:  08/18/2015 CONSULTATION DATE:  08/18/2015  REFERRING MD :  Tyrell Antonio   CHIEF COMPLAINT:  Acute respiratory failure, CAP   SUBJECTIVE:    Still has cough with sputum, but no blood this AM.  VITAL SIGNS: Temp:  [97.8 F (36.6 C)-98.3 F (36.8 C)] 97.8 F (36.6 C) (12/14 0600) Pulse Rate:  [54-61] 61 (12/13 2206) Resp:  [16-17] 16 (12/14 0600) BP: (106-124)/(34-57) 116/44 mmHg (12/14 0600) SpO2:  [94 %-98 %] 98 % (12/14 0905) Weight:  [268 lb (121.564 kg)] 268 lb (121.564 kg) (12/14 0600)  PHYSICAL EXAMINATION: General: pleasant Neuro: normal strength HEENT: no oral lesions, no sinus tenderness Cardiovascular: irregular, 2/6 murmur Lungs: no wheeze Abdomen: soft, non tender Musculoskeletal: no edema   CBC Recent Labs     09/02/15  0240  WBC  5.6  HGB  10.6*  HCT  33.8*  PLT  239    BMET Recent Labs     09/02/15  0240  NA  140  K  4.9  CL  100*  CO2  34*  BUN  31*  CREATININE  1.40*  GLUCOSE  110*    Electrolytes Recent Labs     09/02/15  0240  CALCIUM  8.1*    Glucose Recent Labs     08/31/15  2107  09/01/15  0709  09/01/15  1154  09/01/15  1648  09/01/15  2120  09/02/15  0733  GLUCAP  181*  111*  166*  82  208*  110*    Imaging Dg Chest 2 View  08/31/2015  CLINICAL DATA:  Shortness of breath with cough and weakness. EXAM: CHEST  2 VIEW COMPARISON:  08/29/2015 FINDINGS: There is a left subclavian catheter with tip in the projection of the SVC. Moderate cardiac enlargement noted. There are bilateral multifocal airspace and interstitial opacities. Small bilateral pleural effusions persist. IMPRESSION: 1. No change in aeration a lungs compared with previous exam. 2. Persistent bilateral pleural effusions. Electronically Signed   By: Kerby Moors M.D.   On: 08/31/2015 14:14    SIGNIFICANT EVENTS: 11/29 VDRF 12/01 Extubated 12/02 To SDU 12/05 PCCM asked to  re-evaluate persistent wheezing 12/12 PCCM asked to re-evaluate for hemoptysis 12/13 Hold eliquis, ASA  STUDIES:  11/29 Echo >> EF 25%, mod AI, PAS 41 mmHg 12/06 CT chest >> patch GGO, small b/l effusions  CULTURES: 11/29 Pneumococcal Ag >> negative 11/29 Blood >> negative 11/29 Sputum >> negative  ANTIBIOTICS: 11/29 Zosyn >> 11/29 11/29 Rocephin >> 12/01 11/29 Zithromax >> 12/04 12/02 Tressie Ellis >> 12/06  DISCUSSION:  81 yo male with acute hypoxic respiratory failure 2nd to sepsis, CAP, ALI, and acute pulmonary edema.  Course complicated by A fib with RVR.  PCCM asked to re-assess 12/12 due to hemoptysis >> likely related to airway friability from pneumonia while on ASA and eliquis.  He has hx of CAD, COPD with asthma, DM, OSA on CPAP.  ASSESSMENT / PLAN:  Hemoptysis. Plan: - hold eliquis, ASA for now >> consider resuming 24 hours after last episode of hemoptysis - f/u CXR intermittently - if no improvement, then consider bronchoscopy >> he is very reluctant to do this >> seems less likely he will need bronchoscopy - cough suppressants  Hx of COPD with asthma. Plan: - pulmicort, brovana, spiriva - prn albuterol - bronchial hygiene  Acute hypoxic respiratory failure 2nd to CAP, ALI, acute pulmonary edema >> completed Abx 12/06. Plan: - adjust  oxygen to keep SpO2 90 to 95%  Hx of OSA. Plan: - CPAP qhs   Chesley Mires, MD [place] 09/02/2015, 10:18 AM Pager:  [phone] After 3pm call: [phone]

## 2015-09-03 NOTE — Clinical Social Work Placement (Signed)
   CLINICAL SOCIAL WORK PLACEMENT  NOTE  Date:  09/03/2015  Patient Details  Name: Christopher Holder MRN: [id number] Date of Birth: [birth year]  Clinical Social Work is seeking post-discharge placement for this patient at the [place] level of care (*CSW will initial, date and re-position this form in  chart as items are completed):  Yes   Patient/family provided with [place] Work Department's list of facilities offering this level of care within the geographic area requested by the patient (or if unable, by the patient's family).  Yes   Patient/family informed of their freedom to choose among providers that offer the needed level of care, that participate in Medicare, Medicaid or managed care program needed by the patient, have an available bed and are willing to accept the patient.  Yes   Patient/family informed of [facility]'s ownership interest in [facility] and [facility], as well as of the fact that they are under no obligation to receive care at these facilities.  PASRR submitted to EDS on       PASRR number received on       Existing PASRR number confirmed on 09/03/15     FL2 transmitted to all facilities in geographic area requested by pt/family on 08/31/15     FL2 transmitted to all facilities within larger geographic area on       Patient informed that his/her managed care company has contracts with or will negotiate with certain facilities, including the following:        Yes   Patient/family informed of bed offers received.  Patient chooses bed at [facility]     Physician recommends and patient chooses bed at      Patient to be transferred to [facility] on 09/03/15.  Patient to be transferred to facility by Ambulance     Patient family notified on 09/03/15 of transfer.  Name of family member notified:  Janett Billow     PHYSICIAN       Additional Comment:   Per MD patient ready for DC to [facility] RN, patient,  patient's family, and facility notified of DC. RN given number for report. DC packet on chart. Ambulance transport requested for patient. CSW signing off.  _______________________________________________ Rigoberto Noel, LCSW 09/03/2015, 11:35 AM

## 2015-09-08 ENCOUNTER — Telehealth: Payer: Self-pay | Admitting: Cardiology

## 2015-09-08 NOTE — Telephone Encounter (Signed)
Called Daniel Mabe back at # listed.  NA - LM to CB to discuss pt.  He does not have a follow up appt scheduled at this time.

## 2015-09-08 NOTE — Telephone Encounter (Signed)
New message     Pt recently had a cardioversion. Calling to report that his HR is jumping in the 130's with mild activity.  Please advise

## 2015-09-10 NOTE — Telephone Encounter (Signed)
Pt has an appt 12/29 with [name].

## 2015-09-17 ENCOUNTER — Ambulatory Visit (INDEPENDENT_AMBULATORY_CARE_PROVIDER_SITE_OTHER): Payer: Medicare Other | Admitting: Cardiology

## 2015-09-17 ENCOUNTER — Encounter: Payer: Self-pay | Admitting: Cardiology

## 2015-09-17 VITALS — BP 102/60 | HR 69 | Ht 72.0 in | Wt 279.0 lb

## 2015-09-17 DIAGNOSIS — I5022 Chronic systolic (congestive) heart failure: Secondary | ICD-10-CM

## 2015-09-17 DIAGNOSIS — I5021 Acute systolic (congestive) heart failure: Secondary | ICD-10-CM | POA: Diagnosis not present

## 2015-09-17 DIAGNOSIS — R931 Abnormal findings on diagnostic imaging of heart and coronary circulation: Secondary | ICD-10-CM

## 2015-09-17 DIAGNOSIS — I480 Paroxysmal atrial fibrillation: Secondary | ICD-10-CM

## 2015-09-17 DIAGNOSIS — I10 Essential (primary) hypertension: Secondary | ICD-10-CM

## 2015-09-17 DIAGNOSIS — I2510 Atherosclerotic heart disease of native coronary artery without angina pectoris: Secondary | ICD-10-CM | POA: Diagnosis not present

## 2015-09-17 DIAGNOSIS — J438 Other emphysema: Secondary | ICD-10-CM

## 2015-09-17 DIAGNOSIS — G4733 Obstructive sleep apnea (adult) (pediatric): Secondary | ICD-10-CM

## 2015-09-17 LAB — CBC WITH DIFFERENTIAL/PLATELET
BASOS ABS: 0 10*3/uL (ref 0.0–0.1)
Basophils Relative: 0 % (ref 0–1)
EOS ABS: 0.3 10*3/uL (ref 0.0–0.7)
EOS PCT: 4 % (ref 0–5)
HCT: 37 % — ABNORMAL LOW (ref 39.0–52.0)
Hemoglobin: 12.3 g/dL — ABNORMAL LOW (ref 13.0–17.0)
LYMPHS PCT: 31 % (ref 12–46)
Lymphs Abs: 2.3 10*3/uL (ref 0.7–4.0)
MCH: 29.3 pg (ref 26.0–34.0)
MCHC: 33.2 g/dL (ref 30.0–36.0)
MCV: 88.1 fL (ref 78.0–100.0)
MPV: 9.6 fL (ref 8.6–12.4)
Monocytes Absolute: 0.7 10*3/uL (ref 0.1–1.0)
Monocytes Relative: 9 % (ref 3–12)
NEUTROS PCT: 56 % (ref 43–77)
Neutro Abs: 4.1 10*3/uL (ref 1.7–7.7)
PLATELETS: 230 10*3/uL (ref 150–400)
RBC: 4.2 MIL/uL — AB (ref 4.22–5.81)
RDW: 17.3 % — AB (ref 11.5–15.5)
WBC: 7.3 10*3/uL (ref 4.0–10.5)

## 2015-09-17 LAB — COMPREHENSIVE METABOLIC PANEL
ALT: 17 U/L (ref 9–46)
AST: 19 U/L (ref 10–35)
Albumin: 3.6 g/dL (ref 3.6–5.1)
Alkaline Phosphatase: 80 U/L (ref 40–115)
BILIRUBIN TOTAL: 0.5 mg/dL (ref 0.2–1.2)
BUN: 17 mg/dL (ref 7–25)
CALCIUM: 8.8 mg/dL (ref 8.6–10.3)
CO2: 26 mmol/L (ref 20–31)
Chloride: 103 mmol/L (ref 98–110)
Creat: 1.19 mg/dL — ABNORMAL HIGH (ref 0.70–1.11)
GLUCOSE: 85 mg/dL (ref 65–99)
Potassium: 4.3 mmol/L (ref 3.5–5.3)
Sodium: 143 mmol/L (ref 135–146)
Total Protein: 6.9 g/dL (ref 6.1–8.1)

## 2015-09-17 NOTE — Patient Instructions (Addendum)
Medication Instructions:  Your physician recommends that you continue on your current medications as directed. Please refer to the Current Medication list given to you today.   Labwork: TODAY:  BNP                 CBC W/DIFF                CMP                  Testing/Procedures: Your physician has requested that you have a lexiscan myoview. For further information please visit [email or web address]. Please follow instruction sheet, as given.    Follow-Up: Your physician recommends that you schedule a follow-up appointment with a EP Specialist 1ST AVAILABLE PROVIDER FOR AFIB.   Your physician recommends that you schedule a follow-up appointment in: [address] [place]   Any Other Special Instructions Will Be Listed Below (If Applicable).  Pharmacologic Stress Electrocardiogram A pharmacologic stress electrocardiogram is a heart (cardiac) test that uses nuclear imaging to evaluate the blood supply to your heart. This test may also be called a pharmacologic stress electrocardiography. Pharmacologic means that a medicine is used to increase your heart rate and blood pressure.  This stress test is done to find areas of poor blood flow to the heart by determining the extent of coronary artery disease (CAD). Some people exercise on a treadmill, which naturally increases the blood flow to the heart. For those people unable to exercise on a treadmill, a medicine is used. This medicine stimulates your heart and will cause your heart to beat harder and more quickly, as if you were exercising.  Pharmacologic stress tests can help determine:  The adequacy of blood flow to your heart during increased levels of activity in order to clear you for discharge home.  The extent of coronary artery blockage caused by CAD.  Your prognosis if you have suffered a heart attack.  The effectiveness of cardiac procedures done, such as an angioplasty, which can increase the circulation in your coronary  arteries.  Causes of chest pain or pressure. LET [facility] CARE PROVIDER KNOW ABOUT:  Any allergies you have.  All medicines you are taking, including vitamins, herbs, eye drops, creams, and over-the-counter medicines.  Previous problems you or members of your family have had with the use of anesthetics.  Any blood disorders you have.  Previous surgeries you have had.  Medical conditions you have.  Possibility of pregnancy, if this applies.  If you are currently breastfeeding. RISKS AND COMPLICATIONS Generally, this is a safe procedure. However, as with any procedure, complications can occur. Possible complications include:  You develop pain or pressure in the following areas:  Chest.  Jaw or neck.  Between your shoulder blades.  Radiating down your left arm.  Headache.  Dizziness or light-headedness.  Shortness of breath.  Increased or irregular heartbeat.  Low blood pressure.  Nausea or vomiting.  Flushing.  Redness going up the arm and slight pain during injection of medicine.  Heart attack (rare). BEFORE THE PROCEDURE   Avoid all forms of caffeine for 24 hours before your test or as directed by your health care provider. This includes coffee, tea (even decaffeinated tea), caffeinated sodas, chocolate, cocoa, and certain pain medicines.  Follow your health care provider's instructions regarding eating and drinking before the test.  Take your medicines as directed at regular times with water unless instructed otherwise. Exceptions may include:  If you have diabetes, ask how you are to  take your insulin or pills. It is common to adjust insulin dosing the morning of the test.  If you are taking beta-blocker medicines, it is important to talk to your health care provider about these medicines well before the date of your test. Taking beta-blocker medicines may interfere with the test. In some cases, these medicines need to be changed or stopped 24 hours or  more before the test.  If you wear a nitroglycerin patch, it may need to be removed prior to the test. Ask your health care provider if the patch should be removed before the test.  If you use an inhaler for any breathing condition, bring it with you to the test.  If you are an outpatient, bring a snack so you can eat right after the stress phase of the test.  Do not smoke for 4 hours prior to the test or as directed by your health care provider.  Do not apply lotions, powders, creams, or oils on your chest prior to the test.  Wear comfortable shoes and clothing. Let your health care provider know if you were unable to complete or follow the preparations for your test. PROCEDURE   Multiple patches (electrodes) will be put on your chest. If needed, small areas of your chest may be shaved to get better contact with the electrodes. Once the electrodes are attached to your body, multiple wires will be attached to the electrodes, and your heart rate will be monitored.  An IV access will be started. A nuclear trace (isotope) is given. The isotope may be given intravenously, or it may be swallowed. Nuclear refers to several types of radioactive isotopes, and the nuclear isotope lights up the arteries so that the nuclear images are clear. The isotope is absorbed by your body. This results in low radiation exposure.  A resting nuclear image is taken to show how your heart functions at rest.  A medicine is given through the IV access.  A second scan is done about 1 hour after the medicine injection and determines how your heart functions under stress.  During this stress phase, you will be connected to an electrocardiogram machine. Your blood pressure and oxygen levels will be monitored. AFTER THE PROCEDURE   Your heart rate and blood pressure will be monitored after the test.  You may return to your normal schedule, including diet,activities, and medicines, unless your health care provider  tells you otherwise.   This information is not intended to replace advice given to you by your health care provider. Make sure you discuss any questions you have with your health care provider.   Document Released: 01/22/2009 Document Revised: 09/10/2013 Document Reviewed: 05/13/2013 Elsevier Interactive Patient Education [employer].   If you need a refill on your cardiac medications before your next appointment, please call your pharmacy.

## 2015-09-17 NOTE — Progress Notes (Signed)
Cardiology Office Note   Date:  09/17/2015   ID:  Christopher Holder, DOB 09-12-1930, MRN [id number]  PCP:  Mackie Pai, PA-C  Cardiologist:    Dr. Marlou Porch  Chief Complaint  Patient presents with  . Hospitalization Follow-up    no chest pain      History of Present Illness: Tregg Etters is a 81 y.o. male who presents for post hospital follow up with PAF and new decrease in EF, troponins of pk 77.    81 y/o male with known CAD s/p anterior MI in 2004 with stenting to diagonal branch.   + chronic systolic CHF- with reported EF of 35-40% , HTN, HLD, DM, CKD, OSA -with CPAP and COPD admitted for sepsis and presumed CAP leading to acute respiratory failure requiring intubation 08/18/15.  Echo with EF now 25-30%.  Severe diffuse hypokinesis. Moderate AR, ascending aorta was mildly dilated. PA pk pressure was 41 mmHg.  He had a fib and underwent DCCV 08/28/15.   A fib that began 08/26/15 - CHADSVASC of 5.  He was placed on Eliquis.  Prior to discharge in in [place] he developed S brady and coreg was stopped.  Troponin peaked at 0.40. D/c h/h 10.6/33.8.  Cr/ 1.40    Also he developed hemoptysis and eliquis held.  To be resumed after hemoptysis clears. Eliquis resumed on 09/03/15.   He has had no further hemoptysis.  He has no chest pain and no SOB.  No lightheadedness. .  He has mild edema that is gone in am in ankles but returns by late in the day.    He is back in a flutter but rate is controlled.  Pt not aware of any a fib/ irregular HR. Marland Kitchen        Past Medical History  Diagnosis Date  . Diabetes mellitus without complication ([place])   . CHF (congestive heart failure) ([place])   . Coronary artery disease   . COPD (chronic obstructive pulmonary disease) ([place])   . Asthma   . Hypertension   . HOH (hard of hearing)   . Sleep apnea   . Myocardial infarction ([place])   . Hyperlipidemia 05/27/2015  . GERD (gastroesophageal reflux disease) 05/27/2015  . AKI (acute kidney injury) ([place]) 08/18/2015    Past  Surgical History  Procedure Laterality Date  . Coronary angioplasty with stent placement    . Knee surgery    . Knee surgery      Knee replacement x2  . Cardioversion [name] 08/28/2015    Procedure: CARDIOVERSION;  Surgeon: Jerline Pain, MD;  Location[facility] ENDOSCOPY;  Service: Cardiovascular;  Laterality: [name];     Current Outpatient Prescriptions  Medication Sig Dispense Refill  . acetaminophen (TYLENOL) 325 MG tablet Take 650 mg by mouth every 6 (six) hours as needed for mild pain or headache.    . albuterol (PROVENTIL HFA;VENTOLIN HFA) 108 (90 BASE) MCG/ACT inhaler Inhale 2 puffs into the lungs every 4 (four) hours as needed for wheezing or shortness of breath.     . allopurinol (ZYLOPRIM) 100 MG tablet Take 100 mg by mouth 2 (two) times daily.     . Amino Acids-Protein Hydrolys (FEEDING SUPPLEMENT, PRO-STAT SUGAR FREE 64,) LIQD Take 30 mLs by mouth 3 (three) times daily. 900 mL 0  . apixaban (ELIQUIS) 2.5 MG TABS tablet Take 2.5 mg by mouth 2 (two) times daily.    . benzonatate (TESSALON) 200 MG capsule Take 1 capsule (200 mg total) by mouth 3 (three) times daily.  20 capsule 0  . fluticasone (FLONASE) 50 MCG/ACT nasal spray Place 2 sprays into both nostrils daily.    . furosemide (LASIX) 40 MG tablet Take 1 tablet (40 mg total) by mouth 2 (two) times daily. 60 tablet 0  . gabapentin (NEURONTIN) 600 MG tablet Take 600 mg by mouth 3 (three) times daily.    Marland Kitchen glimepiride (AMARYL) 2 MG tablet Take 1 tablet (2 mg total) by mouth daily. 30 tablet 1  . mometasone-formoterol (DULERA) 200-5 MCG/ACT AERO Inhale 2 puffs into the lungs 2 (two) times daily.    . montelukast (SINGULAIR) 10 MG tablet Take 1 tablet (10 mg total) by mouth at bedtime. 30 tablet 3  . NON FORMULARY Take 15 each by mouth Nightly. CPAP at night    . NON FORMULARY Take by mouth 3 (three) times daily. MED PASS    . omeprazole (PRILOSEC) 20 MG capsule Take 1 capsule (20 mg total) by mouth daily. 30 capsule 1  . polyethylene glycol  (MIRALAX / GLYCOLAX) packet Take 17 g by mouth daily as needed. 14 each 0  . potassium chloride (K-DUR,KLOR-CON) 10 MEQ tablet Take 10 mEq by mouth 2 (two) times daily.    . simvastatin (ZOCOR) 20 MG tablet Take 20 mg by mouth daily.    Marland Kitchen tiotropium (SPIRIVA) 18 MCG inhalation capsule Place 1 capsule (18 mcg total) into inhaler and inhale daily. 30 capsule 12   No current facility-administered medications for this visit.    Allergies:   Percocet and Sulfa antibiotics    Social History:  The patient  reports that he has never smoked. He has never used smokeless tobacco. He reports that he does not drink alcohol or use illicit drugs.   Family History:  The patient's family history includes Cancer in his mother; Diabetes in his father; Heart attack in his brother and sister.    ROS:  General:no colds or fevers,  weight is up from d/c but no SOB.  Different scales and has his clothes on.  He does not add salt to food. Skin:no rashes or ulcers HEENT:no blurred vision, no congestion CV:see HPI PUL:see HPI GI:no diarrhea constipation or melena, no indigestion GU:no hematuria, no dysuria MS:no joint pain, no claudication Neuro:no syncope, no lightheadedness Endo:+ diabetes, no thyroid disease  Wt Readings from Last 3 Encounters:  09/17/15 279 lb (126.554 kg)  09/02/15 268 lb (121.564 kg)  08/07/15 281 lb 12.8 oz (127.824 kg)     PHYSICAL EXAM: VS:  BP 102/60 mmHg  Pulse 69  Ht 6' (1.829 m)  Wt 279 lb (126.554 kg)  BMI 37.83 kg/m2 , BMI Body mass index is 37.83 kg/(m^2). General:Pleasant affect, NAD Skin:Warm and dry, brisk capillary refill HEENT:normocephalic, sclera clear, mucus membranes moist Neck:supple, no JVD, no bruits  Heart:irreg irreg,  without murmur, gallup, rub or click Lungs:clear without rales, rhonchi, or wheezes [id number], soft, non tender, + BS, do not palpate liver spleen or masses Ext:tr to 1+ lower ext edema, 2+ post. tib pulses, 2+ radial  pulses Neuro:alert and oriented X 3, MAE, follows commands, + facial symmetry    EKG:  EKG is ordered today. The ekg ordered today demonstrates a flutter with rate control 69, LBBB and T wave inversions in II and III resolved.     Recent Labs: 08/19/2015: ALT 13* 08/26/2015: B Natriuretic Peptide 1054.2* 08/30/2015: Magnesium 2.7* 09/02/2015: BUN 31*; Creatinine, Ser 1.40*; Hemoglobin 10.6*; Platelets 239; Potassium 4.9; Sodium 140    Lipid Panel No results found for:  CHOL, TRIG, HDL, CHOLHDL, VLDL, LDLCALC, LDLDIRECT     Other studies Reviewed: Additional studies/ records that were reviewed today include: echo, DCCV, hospital notes, d/c summary.   ASSESSMENT AND PLAN:  1.  Cardiomyopathy combined syustolic and diastolic rule out ischemic- decreased from previous of 35-40% now 25%.  No ACE per pulmonary per cough.  Have not placed on ARB due to borderline BP.    2. PAF with PNA and respiratory failure with DCCV that was successful, but now back in a fib.-- pt did not tolerate BB-coreg due to Bradycardia.to 39 50 at times.  Discussed with Dr. Burt Knack with issues of bradycardia before adding antiarrythmic will send for EP consult  3.  CAD with hx of stent 2004 to diag with MI, with acute illness demand ischemic bump in troponin to 0.40 and post DCCV EKG with deep T wave inversions in II and III.  No chest pain today will plan for [facility] with chronic LBBB and pt unable to walk on treadmill due to weakness using walker  4. SSS- EP consult.    5. Hemoptysis- resolved back on Eliquis since the 15th.   4.  OSA -cpap- uses every night  5. COPD with asthma. Followed by pulmonary.   Current medicines are reviewed with the patient today.  The patient Has no concerns regarding medicines.  The following changes have been made:  See above Labs/ tests ordered today include:see above  Disposition:   FU:  see above  Signed, Isaiah Serge, NP  09/17/2015 11:09 AM    [facility] [place], [place] [place], [place] Phone: [phone]; Fax: [phone]

## 2015-09-18 LAB — BRAIN NATRIURETIC PEPTIDE: BRAIN NATRIURETIC PEPTIDE: 291.1 pg/mL — AB (ref 0.0–100.0)

## 2015-09-22 ENCOUNTER — Other Ambulatory Visit: Payer: Self-pay

## 2015-09-22 ENCOUNTER — Telehealth: Payer: Self-pay | Admitting: Medical

## 2015-09-22 ENCOUNTER — Telehealth: Payer: Self-pay | Admitting: *Deleted

## 2015-09-22 DIAGNOSIS — I480 Paroxysmal atrial fibrillation: Secondary | ICD-10-CM | POA: Diagnosis not present

## 2015-09-22 DIAGNOSIS — R262 Difficulty in walking, not elsewhere classified: Secondary | ICD-10-CM | POA: Diagnosis not present

## 2015-09-22 DIAGNOSIS — M6281 Muscle weakness (generalized): Secondary | ICD-10-CM | POA: Diagnosis not present

## 2015-09-22 DIAGNOSIS — J449 Chronic obstructive pulmonary disease, unspecified: Secondary | ICD-10-CM | POA: Diagnosis not present

## 2015-09-22 MED ORDER — GLIMEPIRIDE 2 MG PO TABS
2.0000 mg | ORAL_TABLET | Freq: Every day | ORAL | Status: DC
Start: 2015-09-22 — End: 2015-09-29

## 2015-09-22 MED ORDER — POTASSIUM CHLORIDE CRYS ER 10 MEQ PO TBCR: 10.0000 meq | EXTENDED_RELEASE_TABLET | Freq: Two times a day (BID) | ORAL | Status: DC

## 2015-09-22 MED ORDER — GABAPENTIN 600 MG PO TABS: 600.0000 mg | ORAL_TABLET | Freq: Three times a day (TID) | ORAL | Status: DC

## 2015-09-22 MED ORDER — ALLOPURINOL 100 MG PO TABS: 100.0000 mg | ORAL_TABLET | Freq: Two times a day (BID) | ORAL | Status: DC

## 2015-09-22 MED ORDER — FUROSEMIDE 40 MG PO TABS: 40.0000 mg | ORAL_TABLET | Freq: Two times a day (BID) | ORAL | Status: DC

## 2015-09-22 MED ORDER — SIMVASTATIN 20 MG PO TABS: 20.0000 mg | ORAL_TABLET | Freq: Every day | ORAL | Status: DC

## 2015-09-22 MED ORDER — MONTELUKAST SODIUM 10 MG PO TABS: 10.0000 mg | ORAL_TABLET | Freq: Every day | ORAL | Status: DC

## 2015-09-22 MED ORDER — TIOTROPIUM BROMIDE MONOHYDRATE 18 MCG IN CAPS: 18.0000 ug | ORAL_CAPSULE | Freq: Every day | RESPIRATORY_TRACT | Status: AC

## 2015-09-22 MED ORDER — OMEPRAZOLE 20 MG PO CPDR: 20.0000 mg | DELAYED_RELEASE_CAPSULE | Freq: Every day | ORAL | Status: DC

## 2015-09-22 NOTE — Telephone Encounter (Signed)
-----   Message from Isaiah Serge, NP sent at 09/18/2015 11:01 AM EST ----- Labs improved.  Keep appts as planned.

## 2015-09-22 NOTE — Telephone Encounter (Signed)
Caller name:Jennifer w/ [name]  Relationship to patient: Home Health  Can be reached: [phone] Pharmacy:  Reason for call:Requesting Home Health order for PT 2x a week for 7wks and a aid for 1x this week, then starting next week; 2x for weeks. DX. Weakness and unsafe while standing. Requesting verbal orders.

## 2015-09-22 NOTE — Telephone Encounter (Signed)
Called pt, per [name], NP, to let him know that his labs have improved and to just keep his appts as planned.  Pt verbalized understanding.

## 2015-09-22 NOTE — Telephone Encounter (Signed)
Edward please advise on VO for note below.

## 2015-09-23 ENCOUNTER — Telehealth: Payer: Self-pay

## 2015-09-23 ENCOUNTER — Telehealth (HOSPITAL_COMMUNITY): Payer: Self-pay | Admitting: *Deleted

## 2015-09-23 NOTE — Telephone Encounter (Signed)
[name] calling again, Requesting verbal order

## 2015-09-23 NOTE — Telephone Encounter (Signed)
Edward please advise on VO from note below.

## 2015-09-23 NOTE — Telephone Encounter (Signed)
Prior auth request for Eliquis 5mg  sent to [facility]

## 2015-09-23 NOTE — Telephone Encounter (Signed)
Christopher Holder at [facility] verbal authorization for PT. She states will send written plan of care over for me to sign.

## 2015-09-23 NOTE — Telephone Encounter (Signed)
Patient given detailed instructions per Myocardial Perfusion Study Information Sheet for the test on 09/25/15 at 915. Patient notified to arrive 15 minutes early and that it is imperative to arrive on time for appointment to keep from having the test rescheduled.  If you need to cancel or reschedule your appointment, please call the office within 24 hours of your appointment. Failure to do so may result in a cancellation of your appointment, and a $50 no show fee. Patient verbalized understanding. Hubbard Robinson, RN

## 2015-09-24 ENCOUNTER — Other Ambulatory Visit: Payer: Self-pay

## 2015-09-24 DIAGNOSIS — J449 Chronic obstructive pulmonary disease, unspecified: Secondary | ICD-10-CM | POA: Diagnosis not present

## 2015-09-24 DIAGNOSIS — M6281 Muscle weakness (generalized): Secondary | ICD-10-CM | POA: Diagnosis not present

## 2015-09-24 DIAGNOSIS — I480 Paroxysmal atrial fibrillation: Secondary | ICD-10-CM | POA: Diagnosis not present

## 2015-09-24 DIAGNOSIS — R262 Difficulty in walking, not elsewhere classified: Secondary | ICD-10-CM | POA: Diagnosis not present

## 2015-09-24 MED ORDER — MONTELUKAST SODIUM 10 MG PO TABS: 10.0000 mg | ORAL_TABLET | Freq: Every day | ORAL | Status: DC

## 2015-09-24 MED ORDER — APIXABAN 2.5 MG PO TABS: 2.5000 mg | ORAL_TABLET | Freq: Two times a day (BID) | ORAL | Status: DC

## 2015-09-24 MED ORDER — MOMETASONE FURO-FORMOTEROL FUM 200-5 MCG/ACT IN AERO: 2.0000 | INHALATION_SPRAY | Freq: Two times a day (BID) | RESPIRATORY_TRACT | Status: DC

## 2015-09-24 MED ORDER — FLUTICASONE PROPIONATE 50 MCG/ACT NA SUSP: 2.0000 | Freq: Every day | NASAL | Status: DC

## 2015-09-24 NOTE — Telephone Encounter (Signed)
Rx sent to pharmacy   

## 2015-09-25 ENCOUNTER — Ambulatory Visit (HOSPITAL_COMMUNITY): Payer: Medicare Other | Attending: Cardiology

## 2015-09-25 ENCOUNTER — Telehealth: Payer: Self-pay

## 2015-09-25 ENCOUNTER — Telehealth: Payer: Self-pay | Admitting: Medical

## 2015-09-25 DIAGNOSIS — R9439 Abnormal result of other cardiovascular function study: Secondary | ICD-10-CM | POA: Diagnosis not present

## 2015-09-25 DIAGNOSIS — I10 Essential (primary) hypertension: Secondary | ICD-10-CM | POA: Insufficient documentation

## 2015-09-25 DIAGNOSIS — I509 Heart failure, unspecified: Secondary | ICD-10-CM | POA: Insufficient documentation

## 2015-09-25 DIAGNOSIS — R5383 Other fatigue: Secondary | ICD-10-CM | POA: Diagnosis not present

## 2015-09-25 DIAGNOSIS — I5022 Chronic systolic (congestive) heart failure: Secondary | ICD-10-CM | POA: Diagnosis not present

## 2015-09-25 DIAGNOSIS — R931 Abnormal findings on diagnostic imaging of heart and coronary circulation: Secondary | ICD-10-CM | POA: Diagnosis not present

## 2015-09-25 DIAGNOSIS — R079 Chest pain, unspecified: Secondary | ICD-10-CM | POA: Diagnosis not present

## 2015-09-25 DIAGNOSIS — I447 Left bundle-branch block, unspecified: Secondary | ICD-10-CM | POA: Insufficient documentation

## 2015-09-25 LAB — MYOCARDIAL PERFUSION IMAGING
CHL CUP NUCLEAR SDS: 5
CSEPPHR: 106 {beats}/min
LHR: 0.49
LV sys vol: 165 mL
LVDIAVOL: 256 mL
Rest HR: 75 {beats}/min
SRS: 5
SSS: 10
TID: 1.05

## 2015-09-25 MED ORDER — TECHNETIUM TC 99M SESTAMIBI GENERIC - CARDIOLITE
33.0000 | Freq: Once | INTRAVENOUS | Status: AC | PRN
  Administered 2015-09-25: 33 via INTRAVENOUS

## 2015-09-25 MED ORDER — REGADENOSON 0.4 MG/5ML IV SOLN
0.4000 mg | Freq: Once | INTRAVENOUS | Status: AC
  Administered 2015-09-25: 0.4 mg via INTRAVENOUS

## 2015-09-25 MED ORDER — TECHNETIUM TC 99M SESTAMIBI GENERIC - CARDIOLITE
10.9000 | Freq: Once | INTRAVENOUS | Status: AC | PRN
  Administered 2015-09-25: 10.9 via INTRAVENOUS

## 2015-09-25 NOTE — Telephone Encounter (Signed)
PA initiated through [facility] ([facility]), awaiting determination. JG//CMA

## 2015-09-25 NOTE — Telephone Encounter (Signed)
Caller name:Jessica Thomason Relation to OK:[id number] daughter Call back [phone] Pharmacy:  Reason for call: pt is needing a PA for his rx mometasone-formoterol (DULERA) 200-5 MCG/ACT AERO states pharmacy informed pt that PA is needing

## 2015-09-25 NOTE — Telephone Encounter (Signed)
Eliquis  Approved through 09/18/2016. PA # [id number].

## 2015-09-29 ENCOUNTER — Telehealth: Payer: Self-pay | Admitting: Medical

## 2015-09-29 ENCOUNTER — Telehealth: Payer: Self-pay | Admitting: Cardiology

## 2015-09-29 ENCOUNTER — Other Ambulatory Visit: Payer: Self-pay | Admitting: Medical

## 2015-09-29 ENCOUNTER — Telehealth: Payer: Self-pay

## 2015-09-29 ENCOUNTER — Other Ambulatory Visit (INDEPENDENT_AMBULATORY_CARE_PROVIDER_SITE_OTHER): Payer: Medicare Other | Admitting: *Deleted

## 2015-09-29 ENCOUNTER — Telehealth: Payer: Self-pay | Admitting: *Deleted

## 2015-09-29 ENCOUNTER — Encounter: Payer: Self-pay | Admitting: *Deleted

## 2015-09-29 ENCOUNTER — Ambulatory Visit: Payer: Medicare Other | Admitting: Medical

## 2015-09-29 DIAGNOSIS — Z01812 Encounter for preprocedural laboratory examination: Secondary | ICD-10-CM | POA: Diagnosis not present

## 2015-09-29 DIAGNOSIS — I2510 Atherosclerotic heart disease of native coronary artery without angina pectoris: Secondary | ICD-10-CM | POA: Diagnosis not present

## 2015-09-29 LAB — CBC WITH DIFFERENTIAL/PLATELET
BASOS ABS: 0 10*3/uL (ref 0.0–0.1)
BASOS PCT: 0 % (ref 0–1)
EOS ABS: 0.1 10*3/uL (ref 0.0–0.7)
EOS PCT: 1 % (ref 0–5)
HCT: 37.1 % — ABNORMAL LOW (ref 39.0–52.0)
Hemoglobin: 12 g/dL — ABNORMAL LOW (ref 13.0–17.0)
Lymphocytes Relative: 30 % (ref 12–46)
Lymphs Abs: 2.3 10*3/uL (ref 0.7–4.0)
MCH: 29.1 pg (ref 26.0–34.0)
MCHC: 32.3 g/dL (ref 30.0–36.0)
MCV: 89.8 fL (ref 78.0–100.0)
MPV: 9.6 fL (ref 8.6–12.4)
Monocytes Absolute: 0.5 10*3/uL (ref 0.1–1.0)
Monocytes Relative: 7 % (ref 3–12)
Neutro Abs: 4.8 10*3/uL (ref 1.7–7.7)
Neutrophils Relative %: 62 % (ref 43–77)
PLATELETS: 222 10*3/uL (ref 150–400)
RBC: 4.13 MIL/uL — AB (ref 4.22–5.81)
RDW: 16.7 % — AB (ref 11.5–15.5)
WBC: 7.7 10*3/uL (ref 4.0–10.5)

## 2015-09-29 LAB — BASIC METABOLIC PANEL
BUN: 26 mg/dL — ABNORMAL HIGH (ref 7–25)
CHLORIDE: 104 mmol/L (ref 98–110)
CO2: 29 mmol/L (ref 20–31)
CREATININE: 1.27 mg/dL — AB (ref 0.70–1.11)
Calcium: 8.9 mg/dL (ref 8.6–10.3)
Glucose, Bld: 105 mg/dL — ABNORMAL HIGH (ref 65–99)
POTASSIUM: 3.9 mmol/L (ref 3.5–5.3)
Sodium: 141 mmol/L (ref 135–146)

## 2015-09-29 MED ORDER — GLIMEPIRIDE 2 MG PO TABS: 2.0000 mg | ORAL_TABLET | Freq: Every day | ORAL | Status: DC

## 2015-09-29 MED ORDER — POTASSIUM CHLORIDE CRYS ER 10 MEQ PO TBCR
10.0000 meq | EXTENDED_RELEASE_TABLET | Freq: Two times a day (BID) | ORAL | Status: DC
Start: 2015-09-29 — End: 2015-11-13

## 2015-09-29 MED ORDER — MONTELUKAST SODIUM 10 MG PO TABS: 10.0000 mg | ORAL_TABLET | Freq: Every day | ORAL | Status: DC

## 2015-09-29 MED ORDER — GABAPENTIN 600 MG PO TABS: 600.0000 mg | ORAL_TABLET | Freq: Three times a day (TID) | ORAL | Status: DC

## 2015-09-29 MED FILL — GLIMEPIRIDE 2 MG TABLET: 2 | 30 days supply | Qty: 30 | Fill #0

## 2015-09-29 MED FILL — GABAPENTIN 600 MG TABLET: 600 | 10 days supply | Qty: 30 | Fill #0

## 2015-09-29 MED FILL — POTASSIUM CL 10 MEQ TAB SA: 10 | 15 days supply | Qty: 30 | Fill #0

## 2015-09-29 MED FILL — MONTELUKAST SOD 10 MG TAB: 10 | 30 days supply | Qty: 30 | Fill #0

## 2015-09-29 NOTE — Telephone Encounter (Signed)
Spoke with pt and informed him of cath on Friday. Scheduled labs for today. Went over instructions for cath with pt and granddaughter. Both verbalized understanding and was in agreement with this plan.

## 2015-09-29 NOTE — Telephone Encounter (Signed)
-----   Message from Isaiah Serge, NP sent at 09/28/2015  5:08 PM EST ----- Please arrange for pt to have cardiac cath.  His nuc study was positive,  This week or next.  We did the study for signs of cardiac stress in the hospital. If he or family have questions I will be glad to call.

## 2015-09-29 NOTE — Telephone Encounter (Signed)
Per granddaughter pt has not had a reaction to the glimepride due to the possible reaction popping up in system. Per VO from ES ok to fill until pt can get the medication from the mail order pharmacy. I will advise pt's granddaughter that Rx will be ready at pharmacy for pick up today.//HSM.

## 2015-09-29 NOTE — Telephone Encounter (Signed)
Received Approval on Dulera, sent to scan/SLS

## 2015-09-29 NOTE — Telephone Encounter (Signed)
Discussed with pt and daughter cardiac cath and reason with posititve nuc study. Dr. Marlou Porch has reviewed.   The patient understands that risks included but are not limited to stroke (1 in 1000), death (1 in 82), kidney failure [usually temporary] (1 in 500), bleeding (1 in 200), allergic reaction [possibly serious] (1 in 200).   will plan for Friday.

## 2015-09-29 NOTE — Telephone Encounter (Signed)
Pt and grandaughter and pt report no prior reaction to amaryl. So will refill the medication. Talked with pt grand daughter also. Will you send in mail order 3 months worth and 3 refills. And please send to local pharmacy one month

## 2015-09-29 NOTE — Telephone Encounter (Signed)
Spoke with pt and she voices understanding. She will pick up medications.

## 2015-09-29 NOTE — Telephone Encounter (Signed)
Caller name:Jessica Relationship to patient:grand daughter Can be [phone] Pharmacy:[facility]   Reason for call:He needs an emergency fill on gabapentin, montelukast, glimpiride, klor-kon  He gets his meds from a mail order pharmacy and they are delayed due to the weather

## 2015-09-30 ENCOUNTER — Telehealth: Payer: Self-pay | Admitting: *Deleted

## 2015-09-30 DIAGNOSIS — J449 Chronic obstructive pulmonary disease, unspecified: Secondary | ICD-10-CM | POA: Diagnosis not present

## 2015-09-30 DIAGNOSIS — I480 Paroxysmal atrial fibrillation: Secondary | ICD-10-CM | POA: Diagnosis not present

## 2015-09-30 DIAGNOSIS — M6281 Muscle weakness (generalized): Secondary | ICD-10-CM | POA: Diagnosis not present

## 2015-09-30 DIAGNOSIS — R262 Difficulty in walking, not elsewhere classified: Secondary | ICD-10-CM | POA: Diagnosis not present

## 2015-09-30 LAB — PROTIME-INR
INR: 1.26 (ref <1.50)
Prothrombin Time: 16 seconds — ABNORMAL HIGH (ref 11.6–15.2)

## 2015-09-30 NOTE — Telephone Encounter (Signed)
Per [name], NP, pt has been contact re: his labs and has been advised to stop the lasix and potassium until after his procedure.  Pt verbalized understanding.

## 2015-09-30 NOTE — Telephone Encounter (Signed)
-----   Message from Isaiah Serge, NP sent at 09/30/2015  3:07 PM EST ----- On Mr. Linville please have him hold his K+ also.

## 2015-10-01 DIAGNOSIS — J449 Chronic obstructive pulmonary disease, unspecified: Secondary | ICD-10-CM | POA: Diagnosis not present

## 2015-10-01 DIAGNOSIS — R9439 Abnormal result of other cardiovascular function study: Secondary | ICD-10-CM | POA: Diagnosis present

## 2015-10-01 DIAGNOSIS — R262 Difficulty in walking, not elsewhere classified: Secondary | ICD-10-CM | POA: Diagnosis not present

## 2015-10-01 DIAGNOSIS — I480 Paroxysmal atrial fibrillation: Secondary | ICD-10-CM | POA: Diagnosis not present

## 2015-10-01 DIAGNOSIS — M6281 Muscle weakness (generalized): Secondary | ICD-10-CM | POA: Diagnosis not present

## 2015-10-02 ENCOUNTER — Ambulatory Visit (HOSPITAL_COMMUNITY)
Admission: RE | Admit: 2015-10-02 | Discharge: 2015-10-02 | Disposition: A | Discharge status: Home / Self Care | Payer: Medicare Other | Source: Ambulatory Visit | Attending: Interventional Cardiology | Admitting: Interventional Cardiology

## 2015-10-02 ENCOUNTER — Encounter (HOSPITAL_COMMUNITY): Payer: Self-pay | Admitting: Cardiology

## 2015-10-02 ENCOUNTER — Encounter (HOSPITAL_COMMUNITY)
Admission: RE | Disposition: A | Discharge status: Home / Self Care | Payer: Self-pay | Source: Ambulatory Visit | Attending: Interventional Cardiology

## 2015-10-02 ENCOUNTER — Other Ambulatory Visit: Payer: Self-pay | Admitting: Medical

## 2015-10-02 DIAGNOSIS — R9439 Abnormal result of other cardiovascular function study: Secondary | ICD-10-CM | POA: Diagnosis present

## 2015-10-02 DIAGNOSIS — Z7901 Long term (current) use of anticoagulants: Secondary | ICD-10-CM | POA: Diagnosis not present

## 2015-10-02 DIAGNOSIS — I2510 Atherosclerotic heart disease of native coronary artery without angina pectoris: Secondary | ICD-10-CM | POA: Diagnosis not present

## 2015-10-02 DIAGNOSIS — K219 Gastro-esophageal reflux disease without esophagitis: Secondary | ICD-10-CM | POA: Diagnosis not present

## 2015-10-02 DIAGNOSIS — Z955 Presence of coronary angioplasty implant and graft: Secondary | ICD-10-CM | POA: Insufficient documentation

## 2015-10-02 DIAGNOSIS — I5042 Chronic combined systolic (congestive) and diastolic (congestive) heart failure: Secondary | ICD-10-CM | POA: Diagnosis not present

## 2015-10-02 DIAGNOSIS — J45909 Unspecified asthma, uncomplicated: Secondary | ICD-10-CM | POA: Diagnosis not present

## 2015-10-02 DIAGNOSIS — I480 Paroxysmal atrial fibrillation: Secondary | ICD-10-CM | POA: Diagnosis not present

## 2015-10-02 DIAGNOSIS — E1122 Type 2 diabetes mellitus with diabetic chronic kidney disease: Secondary | ICD-10-CM | POA: Insufficient documentation

## 2015-10-02 DIAGNOSIS — I130 Hypertensive heart and chronic kidney disease with heart failure and stage 1 through stage 4 chronic kidney disease, or unspecified chronic kidney disease: Secondary | ICD-10-CM | POA: Insufficient documentation

## 2015-10-02 DIAGNOSIS — I420 Dilated cardiomyopathy: Secondary | ICD-10-CM | POA: Diagnosis not present

## 2015-10-02 DIAGNOSIS — R931 Abnormal findings on diagnostic imaging of heart and coronary circulation: Secondary | ICD-10-CM

## 2015-10-02 DIAGNOSIS — N189 Chronic kidney disease, unspecified: Secondary | ICD-10-CM | POA: Diagnosis not present

## 2015-10-02 DIAGNOSIS — G4733 Obstructive sleep apnea (adult) (pediatric): Secondary | ICD-10-CM | POA: Diagnosis not present

## 2015-10-02 DIAGNOSIS — I495 Sick sinus syndrome: Secondary | ICD-10-CM | POA: Diagnosis not present

## 2015-10-02 DIAGNOSIS — I252 Old myocardial infarction: Secondary | ICD-10-CM | POA: Insufficient documentation

## 2015-10-02 DIAGNOSIS — E785 Hyperlipidemia, unspecified: Secondary | ICD-10-CM | POA: Diagnosis not present

## 2015-10-02 DIAGNOSIS — I428 Other cardiomyopathies: Secondary | ICD-10-CM

## 2015-10-02 DIAGNOSIS — E119 Type 2 diabetes mellitus without complications: Secondary | ICD-10-CM

## 2015-10-02 DIAGNOSIS — J449 Chronic obstructive pulmonary disease, unspecified: Secondary | ICD-10-CM | POA: Insufficient documentation

## 2015-10-02 DIAGNOSIS — I5022 Chronic systolic (congestive) heart failure: Secondary | ICD-10-CM | POA: Diagnosis present

## 2015-10-02 HISTORY — PX: CARDIAC CATHETERIZATION: SHX172

## 2015-10-02 LAB — BASIC METABOLIC PANEL
Anion gap: 9 (ref 5–15)
BUN: 19 mg/dL (ref 6–20)
CHLORIDE: 108 mmol/L (ref 101–111)
CO2: 27 mmol/L (ref 22–32)
CREATININE: 1.32 mg/dL — AB (ref 0.61–1.24)
Calcium: 9.1 mg/dL (ref 8.9–10.3)
GFR calc Af Amer: 56 mL/min — ABNORMAL LOW (ref >60)
GFR calc non Af Amer: 48 mL/min — ABNORMAL LOW (ref >60)
GLUCOSE: 101 mg/dL — AB (ref 65–99)
POTASSIUM: 3.9 mmol/L (ref 3.5–5.1)
Sodium: 144 mmol/L (ref 135–145)

## 2015-10-02 LAB — GLUCOSE, CAPILLARY
Glucose-Capillary: 84 mg/dL (ref 65–99)
Glucose-Capillary: 80 mg/dL (ref 65–99)

## 2015-10-02 SURGERY — LEFT HEART CATH AND CORONARY ANGIOGRAPHY

## 2015-10-02 MED ORDER — SODIUM CHLORIDE 0.9 % IJ SOLN: 3.0000 mL | Freq: Two times a day (BID) | INTRAMUSCULAR | Status: DC

## 2015-10-02 MED ORDER — HEPARIN SODIUM (PORCINE) 1000 UNIT/ML IJ SOLN
INTRAMUSCULAR | Status: AC
  Filled 2015-10-02: qty 1

## 2015-10-02 MED ORDER — HEPARIN SODIUM (PORCINE) 1000 UNIT/ML IJ SOLN
INTRAMUSCULAR | Status: DC | PRN
  Administered 2015-10-02: 6000 [IU] via INTRAVENOUS

## 2015-10-02 MED ORDER — SODIUM CHLORIDE 0.9 % IJ SOLN: 3.0000 mL | INTRAMUSCULAR | Status: DC | PRN

## 2015-10-02 MED ORDER — SODIUM CHLORIDE 0.9 % IV SOLN: 250.0000 mL | INTRAVENOUS | Status: DC | PRN

## 2015-10-02 MED ORDER — HEPARIN (PORCINE) IN NACL 2-0.9 UNIT/ML-% IJ SOLN
INTRAMUSCULAR | Status: AC
  Filled 2015-10-02: qty 500

## 2015-10-02 MED ORDER — FENTANYL CITRATE (PF) 100 MCG/2ML IJ SOLN
INTRAMUSCULAR | Status: DC | PRN
  Administered 2015-10-02 (×2): 25 ug via INTRAVENOUS

## 2015-10-02 MED ORDER — VERAPAMIL HCL 2.5 MG/ML IV SOLN
INTRAVENOUS | Status: DC | PRN
  Administered 2015-10-02: 10:00:00 via INTRA_ARTERIAL

## 2015-10-02 MED ORDER — IOHEXOL 350 MG/ML SOLN
INTRAVENOUS | Status: DC | PRN
  Administered 2015-10-02: 90 mL via INTRA_ARTERIAL

## 2015-10-02 MED ORDER — ONDANSETRON HCL 4 MG/2ML IJ SOLN: 4.0000 mg | Freq: Four times a day (QID) | INTRAMUSCULAR | Status: DC | PRN

## 2015-10-02 MED ORDER — FENTANYL CITRATE (PF) 100 MCG/2ML IJ SOLN
INTRAMUSCULAR | Status: AC
  Filled 2015-10-02: qty 2

## 2015-10-02 MED ORDER — LIDOCAINE HCL (PF) 1 % IJ SOLN
INTRAMUSCULAR | Status: DC | PRN
  Administered 2015-10-02: 10:00:00

## 2015-10-02 MED ORDER — SODIUM CHLORIDE 0.9 % WEIGHT BASED INFUSION: 1.0000 mL/kg/h | INTRAVENOUS | Status: DC

## 2015-10-02 MED ORDER — SODIUM CHLORIDE 0.9 % WEIGHT BASED INFUSION: 3.0000 mL/kg/h | INTRAVENOUS | Status: DC

## 2015-10-02 MED ORDER — LIDOCAINE HCL (PF) 1 % IJ SOLN
INTRAMUSCULAR | Status: AC
  Filled 2015-10-02: qty 30

## 2015-10-02 MED ORDER — ASPIRIN 81 MG PO CHEW: 81.0000 mg | CHEWABLE_TABLET | ORAL | Status: DC

## 2015-10-02 MED ORDER — VERAPAMIL HCL 2.5 MG/ML IV SOLN
INTRAVENOUS | Status: AC
  Filled 2015-10-02: qty 2

## 2015-10-02 SURGICAL SUPPLY — 12 items

## 2015-10-02 NOTE — Discharge Instructions (Signed)
Radial Site Care °Refer to this sheet in the next few weeks. These instructions provide you with information about caring for yourself after your procedure. Your health care provider may also give you more specific instructions. Your treatment has been planned according to current medical practices, but problems sometimes occur. Call your health care provider if you have any problems or questions after your procedure. °WHAT TO EXPECT AFTER THE PROCEDURE °After your procedure, it is typical to have the following: °· Bruising at the radial site that usually fades within 1-2 weeks. °· Blood collecting in the tissue (hematoma) that may be painful to the touch. It should usually decrease in size and tenderness within 1-2 weeks. °HOME CARE INSTRUCTIONS °· Take medicines only as directed by your health care provider. °· You may shower 24-48 hours after the procedure or as directed by your health care provider. Remove the bandage (dressing) and gently wash the site with plain soap and water. Pat the area dry with a clean towel. Do not rub the site, because this may cause bleeding. °· Do not take baths, swim, or use a hot tub until your health care provider approves. °· Check your insertion site every day for redness, swelling, or drainage. °· Do not apply powder or lotion to the site. °· Do not flex or bend the affected arm for 24 hours or as directed by your health care provider. °· Do not push or pull heavy objects with the affected arm for 24 hours or as directed by your health care provider. °· Do not lift over 10 lb (4.5 kg) for 5 days after your procedure or as directed by your health care provider. °· Ask your health care provider when it is okay to: °¨ Return to work or school. °¨ Resume usual physical activities or sports. °¨ Resume sexual activity. °· Do not drive home if you are discharged the same day as the procedure. Have someone else drive you. °· You may drive 24 hours after the procedure unless otherwise  instructed by your health care provider. °· Do not operate machinery or power tools for 24 hours after the procedure. °· If your procedure was done as an outpatient procedure, which means that you went home the same day as your procedure, a responsible adult should be with you for the first 24 hours after you arrive home. °· Keep all follow-up visits as directed by your health care provider. This is important. °SEEK MEDICAL CARE IF: °· You have a fever. °· You have chills. °· You have increased bleeding from the radial site. Hold pressure on the site and call 911. °SEEK IMMEDIATE MEDICAL CARE IF: °· You have unusual pain at the radial site. °· You have redness, warmth, or swelling at the radial site. °· You have drainage (other than a small amount of blood on the dressing) from the radial site. °· The radial site is bleeding, and the bleeding does not stop after 30 minutes of holding steady pressure on the site. °· Your arm or hand becomes pale, cool, tingly, or numb. °  °This information is not intended to replace advice given to you by your health care provider. Make sure you discuss any questions you have with your health care provider. °  °Document Released: 10/08/2010 Document Revised: 09/26/2014 Document Reviewed: 03/24/2014 °Elsevier Interactive Patient Education ©2016 [employer] Inc[employer] ° °

## 2015-10-02 NOTE — H&P (View-Only) (Signed)
Cardiology Office Note   Date:  09/17/2015   ID:  Christopher Holder, DOB 01-15-1930, MRN [id number]  PCP:  Mackie Pai, PA-C  Cardiologist:    Dr. Marlou Porch  Chief Complaint  Patient presents with  . Hospitalization Follow-up    no chest pain      History of Present Illness: Christopher Holder is a 82 y.o. male who presents for post hospital follow up with PAF and new decrease in EF, troponins of pk 52.    82 y/o male with known CAD s/p anterior MI in 2004 with stenting to diagonal branch.   + chronic systolic CHF- with reported EF of 35-40% , HTN, HLD, DM, CKD, OSA -with CPAP and COPD admitted for sepsis and presumed CAP leading to acute respiratory failure requiring intubation 08/18/15.  Echo with EF now 25-30%.  Severe diffuse hypokinesis. Moderate AR, ascending aorta was mildly dilated. PA pk pressure was 41 mmHg.  He had a fib and underwent DCCV 08/28/15.   A fib that began 08/26/15 - CHADSVASC of 5.  He was placed on Eliquis.  Prior to discharge in in [place] he developed S brady and coreg was stopped.  Troponin peaked at 0.40. D/c h/h 10.6/33.8.  Cr/ 1.40    Also he developed hemoptysis and eliquis held.  To be resumed after hemoptysis clears. Eliquis resumed on 09/03/15.   He has had no further hemoptysis.  He has no chest pain and no SOB.  No lightheadedness. .  He has mild edema that is gone in am in ankles but returns by late in the day.    He is back in a flutter but rate is controlled.  Pt not aware of any a fib/ irregular HR. Marland Kitchen        Past Medical History  Diagnosis Date  . Diabetes mellitus without complication ([place])   . CHF (congestive heart failure) ([place])   . Coronary artery disease   . COPD (chronic obstructive pulmonary disease) ([place])   . Asthma   . Hypertension   . HOH (hard of hearing)   . Sleep apnea   . Myocardial infarction ([place])   . Hyperlipidemia 05/27/2015  . GERD (gastroesophageal reflux disease) 05/27/2015  . AKI (acute kidney injury) ([place]) 08/18/2015    Past  Surgical History  Procedure Laterality Date  . Coronary angioplasty with stent placement    . Knee surgery    . Knee surgery      Knee replacement x2  . Cardioversion [name] 08/28/2015    Procedure: CARDIOVERSION;  Surgeon: Jerline Pain, MD;  Location: [facility];  Service: Cardiovascular;  Laterality: [name];     Current Outpatient Prescriptions  Medication Sig Dispense Refill  . acetaminophen (TYLENOL) 325 MG tablet Take 650 mg by mouth every 6 (six) hours as needed for mild pain or headache.    . albuterol (PROVENTIL HFA;VENTOLIN HFA) 108 (90 BASE) MCG/ACT inhaler Inhale 2 puffs into the lungs every 4 (four) hours as needed for wheezing or shortness of breath.     . allopurinol (ZYLOPRIM) 100 MG tablet Take 100 mg by mouth 2 (two) times daily.     . Amino Acids-Protein Hydrolys (FEEDING SUPPLEMENT, PRO-STAT SUGAR FREE 64,) LIQD Take 30 mLs by mouth 3 (three) times daily. 900 mL 0  . apixaban (ELIQUIS) 2.5 MG TABS tablet Take 2.5 mg by mouth 2 (two) times daily.    . benzonatate (TESSALON) 200 MG capsule Take 1 capsule (200 mg total) by mouth 3 (three) times daily.  20 capsule 0  . fluticasone (FLONASE) 50 MCG/ACT nasal spray Place 2 sprays into both nostrils daily.    . furosemide (LASIX) 40 MG tablet Take 1 tablet (40 mg total) by mouth 2 (two) times daily. 60 tablet 0  . gabapentin (NEURONTIN) 600 MG tablet Take 600 mg by mouth 3 (three) times daily.    Marland Kitchen glimepiride (AMARYL) 2 MG tablet Take 1 tablet (2 mg total) by mouth daily. 30 tablet 1  . mometasone-formoterol (DULERA) 200-5 MCG/ACT AERO Inhale 2 puffs into the lungs 2 (two) times daily.    . montelukast (SINGULAIR) 10 MG tablet Take 1 tablet (10 mg total) by mouth at bedtime. 30 tablet 3  . NON FORMULARY Take 15 each by mouth Nightly. CPAP at night    . NON FORMULARY Take by mouth 3 (three) times daily. MED PASS    . omeprazole (PRILOSEC) 20 MG capsule Take 1 capsule (20 mg total) by mouth daily. 30 capsule 1  . polyethylene glycol  (MIRALAX / GLYCOLAX) packet Take 17 g by mouth daily as needed. 14 each 0  . potassium chloride (K-DUR,KLOR-CON) 10 MEQ tablet Take 10 mEq by mouth 2 (two) times daily.    . simvastatin (ZOCOR) 20 MG tablet Take 20 mg by mouth daily.    Marland Kitchen tiotropium (SPIRIVA) 18 MCG inhalation capsule Place 1 capsule (18 mcg total) into inhaler and inhale daily. 30 capsule 12   No current facility-administered medications for this visit.    Allergies:   Percocet and Sulfa antibiotics    Social History:  The patient  reports that he has never smoked. He has never used smokeless tobacco. He reports that he does not drink alcohol or use illicit drugs.   Family History:  The patient's family history includes Cancer in his mother; Diabetes in his father; Heart attack in his brother and sister.    ROS:  General:no colds or fevers,  weight is up from d/c but no SOB.  Different scales and has his clothes on.  He does not add salt to food. Skin:no rashes or ulcers HEENT:no blurred vision, no congestion CV:see HPI PUL:see HPI GI:no diarrhea constipation or melena, no indigestion GU:no hematuria, no dysuria MS:no joint pain, no claudication Neuro:no syncope, no lightheadedness Endo:+ diabetes, no thyroid disease  Wt Readings from Last 3 Encounters:  09/17/15 279 lb (126.554 kg)  09/02/15 268 lb (121.564 kg)  08/07/15 281 lb 12.8 oz (127.824 kg)     PHYSICAL EXAM: VS:  BP 102/60 mmHg  Pulse 69  Ht 6' (1.829 m)  Wt 279 lb (126.554 kg)  BMI 37.83 kg/m2 , BMI Body mass index is 37.83 kg/(m^2). General:Pleasant affect, NAD Skin:Warm and dry, brisk capillary refill HEENT:normocephalic, sclera clear, mucus membranes moist Neck:supple, no JVD, no bruits  Heart:irreg irreg,  without murmur, gallup, rub or click Lungs:clear without rales, rhonchi, or wheezes [id number], soft, non tender, + BS, do not palpate liver spleen or masses Ext:tr to 1+ lower ext edema, 2+ post. tib pulses, 2+ radial  pulses Neuro:alert and oriented X 3, MAE, follows commands, + facial symmetry    EKG:  EKG is ordered today. The ekg ordered today demonstrates a flutter with rate control 69, LBBB and T wave inversions in II and III resolved.     Recent Labs: 08/19/2015: ALT 13* 08/26/2015: B Natriuretic Peptide 1054.2* 08/30/2015: Magnesium 2.7* 09/02/2015: BUN 31*; Creatinine, Ser 1.40*; Hemoglobin 10.6*; Platelets 239; Potassium 4.9; Sodium 140    Lipid Panel No results found for:  CHOL, TRIG, HDL, CHOLHDL, VLDL, LDLCALC, LDLDIRECT     Other studies Reviewed: Additional studies/ records that were reviewed today include: echo, DCCV, hospital notes, d/c summary.   ASSESSMENT AND PLAN:  1.  Cardiomyopathy combined syustolic and diastolic rule out ischemic- decreased from previous of 35-40% now 25%.  No ACE per pulmonary per cough.  Have not placed on ARB due to borderline BP.    2. PAF with PNA and respiratory failure with DCCV that was successful, but now back in a fib.-- pt did not tolerate BB-coreg due to Bradycardia.to 39 50 at times.  Discussed with Dr. Burt Knack with issues of bradycardia before adding antiarrythmic will send for EP consult  3.  CAD with hx of stent 2004 to diag with MI, with acute illness demand ischemic bump in troponin to 0.40 and post DCCV EKG with deep T wave inversions in II and III.  No chest pain today will plan for [facility] with chronic LBBB and pt unable to walk on treadmill due to weakness using walker  4. SSS- EP consult.    5. Hemoptysis- resolved back on Eliquis since the 15th.   4.  OSA -cpap- uses every night  5. COPD with asthma. Followed by pulmonary.   Current medicines are reviewed with the patient today.  The patient Has no concerns regarding medicines.  The following changes have been made:  See above Labs/ tests ordered today include:see above  Disposition:   FU:  see above  Signed, Isaiah Serge, NP  09/17/2015 11:09 AM    [facility] [place] [place], [place] Phone: [phone]; Fax: [phone]

## 2015-10-02 NOTE — Interval H&P Note (Signed)
History and Physical Interval Note:  10/02/2015 8:06 AM  Christopher Holder  has presented today for surgery, with the diagnosis of adnormal stress test with known CAD-PCI.   The various methods of treatment have been discussed with the patient and family. After consideration of risks, benefits and other options for treatment, the patient has consented to  Procedure(s): Left Heart Cath and Coronary Angiography ([name]) with Possible Percutaneous Intervention as a surgical intervention .  The patient's history has been reviewed, patient examined, no change in status, stable for surgery.  I have reviewed the patient's chart and labs.  Questions were answered to the patient's satisfaction.     Cath Lab Visit (complete for each Cath Lab visit)  Clinical Evaluation Leading to the Procedure:   ACS: No.  Non-ACS:    Anginal Classification: CCS III  Anti-ischemic medical therapy: Minimal Therapy (1 class of medications)  Non-Invasive Test Results: High-risk stress test findings: cardiac mortality >3%/year  Prior CABG: No previous CABG        HARDING, DAVID W

## 2015-10-05 DIAGNOSIS — I480 Paroxysmal atrial fibrillation: Secondary | ICD-10-CM | POA: Diagnosis not present

## 2015-10-05 DIAGNOSIS — J449 Chronic obstructive pulmonary disease, unspecified: Secondary | ICD-10-CM | POA: Diagnosis not present

## 2015-10-05 DIAGNOSIS — R262 Difficulty in walking, not elsewhere classified: Secondary | ICD-10-CM | POA: Diagnosis not present

## 2015-10-05 DIAGNOSIS — M6281 Muscle weakness (generalized): Secondary | ICD-10-CM | POA: Diagnosis not present

## 2015-10-06 ENCOUNTER — Ambulatory Visit (HOSPITAL_BASED_OUTPATIENT_CLINIC_OR_DEPARTMENT_OTHER)
Admission: RE | Admit: 2015-10-06 | Discharge: 2015-10-06 | Disposition: A | Discharge status: Home / Self Care | Payer: Medicare Other | Source: Ambulatory Visit | Attending: Medical | Admitting: Medical

## 2015-10-06 ENCOUNTER — Ambulatory Visit (INDEPENDENT_AMBULATORY_CARE_PROVIDER_SITE_OTHER): Payer: Medicare Other | Admitting: Medical

## 2015-10-06 ENCOUNTER — Encounter: Payer: Self-pay | Admitting: Medical

## 2015-10-06 ENCOUNTER — Telehealth: Payer: Self-pay | Admitting: Medical

## 2015-10-06 ENCOUNTER — Ambulatory Visit: Payer: Medicare Other | Admitting: Medical

## 2015-10-06 VITALS — BP 118/70 | HR 86 | Temp 98.1°F | Ht 72.0 in | Wt 281.4 lb

## 2015-10-06 DIAGNOSIS — I5022 Chronic systolic (congestive) heart failure: Secondary | ICD-10-CM | POA: Diagnosis not present

## 2015-10-06 DIAGNOSIS — E119 Type 2 diabetes mellitus without complications: Secondary | ICD-10-CM

## 2015-10-06 DIAGNOSIS — Z23 Encounter for immunization: Secondary | ICD-10-CM | POA: Diagnosis not present

## 2015-10-06 DIAGNOSIS — R918 Other nonspecific abnormal finding of lung field: Secondary | ICD-10-CM | POA: Diagnosis not present

## 2015-10-06 DIAGNOSIS — J441 Chronic obstructive pulmonary disease with (acute) exacerbation: Secondary | ICD-10-CM

## 2015-10-06 DIAGNOSIS — J189 Pneumonia, unspecified organism: Secondary | ICD-10-CM | POA: Diagnosis not present

## 2015-10-06 DIAGNOSIS — J449 Chronic obstructive pulmonary disease, unspecified: Secondary | ICD-10-CM | POA: Diagnosis not present

## 2015-10-06 DIAGNOSIS — R0602 Shortness of breath: Secondary | ICD-10-CM | POA: Insufficient documentation

## 2015-10-06 DIAGNOSIS — R0600 Dyspnea, unspecified: Secondary | ICD-10-CM

## 2015-10-06 DIAGNOSIS — I480 Paroxysmal atrial fibrillation: Secondary | ICD-10-CM | POA: Diagnosis not present

## 2015-10-06 DIAGNOSIS — M6281 Muscle weakness (generalized): Secondary | ICD-10-CM | POA: Diagnosis not present

## 2015-10-06 DIAGNOSIS — R262 Difficulty in walking, not elsewhere classified: Secondary | ICD-10-CM | POA: Diagnosis not present

## 2015-10-06 LAB — COMPREHENSIVE METABOLIC PANEL
ALT: 16 U/L (ref 0–53)
AST: 18 U/L (ref 0–37)
Albumin: 3.6 g/dL (ref 3.5–5.2)
Alkaline Phosphatase: 84 U/L (ref 39–117)
BUN: 15 mg/dL (ref 6–23)
CALCIUM: 8.9 mg/dL (ref 8.4–10.5)
CHLORIDE: 107 meq/L (ref 96–112)
CO2: 31 meq/L (ref 19–32)
CREATININE: 1.09 mg/dL (ref 0.40–1.50)
GFR: 68.62 mL/min (ref >60.00)
Glucose, Bld: 68 mg/dL — ABNORMAL LOW (ref 70–99)
POTASSIUM: 4.2 meq/L (ref 3.5–5.1)
Sodium: 144 mEq/L (ref 135–145)
Total Bilirubin: 0.6 mg/dL (ref 0.2–1.2)
Total Protein: 6.7 g/dL (ref 6.0–8.3)

## 2015-10-06 LAB — BRAIN NATRIURETIC PEPTIDE: Pro B Natriuretic peptide (BNP): 474 pg/mL — ABNORMAL HIGH (ref 0.0–100.0)

## 2015-10-06 MED ORDER — AZITHROMYCIN 250 MG PO TABS: ORAL_TABLET | ORAL | Status: DC

## 2015-10-06 NOTE — Patient Instructions (Addendum)
We did give you tdap and psv 23 vaccine today.  Pneumonia resolved.  Will get labs today to see if heart failure flare. Also get chest xray.  For diabetes get urine microalbumin. Last a1-c 6.2 end of november.  Continue current medications. Follow up with pulmonologist. Consider use of 02 at home since you declined on discharge.  Follow up in 2-3 weeks or as needed

## 2015-10-06 NOTE — Progress Notes (Signed)
Pre visit review using our clinic review tool, if applicable. No additional management support is needed unless otherwise documented below in the visit note. 

## 2015-10-06 NOTE — Telephone Encounter (Signed)
Azithromycin sent to his pharmacy

## 2015-10-06 NOTE — Progress Notes (Signed)
Subjective:    Patient ID: Christopher Holder, male    DOB: 06/18/1930, 82 y.o.   MRN: [id number]  HPI Hospital Course:  Interim history Patient developed progressive respiratory distress with copious sputum production and was monitored in the ICU for progressive respiratory failure. Patient did need mechanical ventilation and he was successfully extubated on 08/21/2015. 2. She then resumed care. Patient was found to have atrial fibrillation and started on heparin GTT and had cardioversion version on 08/28/2015.  New proximal atrial fibrillation -Status post cardioversion -CHADSVASC 5, was placed on a liquid however discontinued by pulmonology as patient developed hemoptysis -Patient developed bradycardia, Coreg was discontinued on 08/31/2015 -Continues to be in sinus rhythm -Will need follow-up with Dr. Marlou Porch  Acute hypoxic respiratory failure -Resolved -Thought to be secondary to community-acquired pneumonia with pulmonary edema versus acute lung injury -Patient did require intubation and was extubated successfully on 08/21/2015 -Continue supplemental oxygen  Hemoptysis -Continue to monitor, continue antitussives -possibly secondary to airway friability  -PCCM consult is appreciated and recommended to continue holding Eliquis until patient has had 24 hours of no further hemoptysis. -Viewed sputum today, no blood noted -If patient is restarted on Eliquis and develops hemoptyis again, may follow up with pulmonology as an outpatient.  Community-acquired pneumonia -Patient received 10 days of antibiotic treatment  COPD/asthma -Improving, weaning steroids -Continue albuterol as needed, Pulmicort, Brovana, Spiriva  Acute on chronic systolic heart failure/cardiomyopathy -Stable -Continue to monitor intake and output, daily weight -Continue Lasix -Echocardiogram 08/22/2015 shows EF of 25-30% -No ACEI due to CKD  Nonsustained V. Tach -Coreg discontinued due to bradycardia  Acute  on chronic kidney disease, stage III -Resolved, continue to monitor BMP  GERD -Continue PPI  Thrombocytopenia -Resolved  Type 2 diabetes mellitus, with neuropathy -Continue insulin sliding scale CBG monitoring and Lantus -Continue gabapentin for neuropathy  Hyperlipidemia -Continue statin  ?Right leg cellulitis -Resolved  Obstructive sleep apnea -CPAP  Above is discharge summary from hospitalization. Pt followed up with cardiologist and I reviewed those notes. Pt had cardiac catheterization just past Friday and no blockages. Stents is still in place.  Pt since discharge still feels some fatigue. He will get short of breath easily. Pt does have hx of bnp elevation. Also hx of new atrial fibrillation. Pt is on lasix 40 mg a day. Pt has been put on coreg to control for rate control.     Review of Systems  Constitutional: Negative for chills and fatigue.  Respiratory: Negative for chest tightness, shortness of breath and wheezing.        Dyspnea with activity.  Cardiovascular: Negative for chest pain and palpitations.  Genitourinary: Negative for dysuria, flank pain, discharge, scrotal swelling and genital sores.  Neurological: Negative for dizziness and headaches.  Psychiatric/Behavioral: Negative for behavioral problems and confusion.     Past Medical History  Diagnosis Date  . Diabetes mellitus without complication ([place])   . CHF (congestive heart failure) ([place])   . Coronary artery disease   . COPD (chronic obstructive pulmonary disease) ([place])   . Asthma   . Hypertension   . HOH (hard of hearing)   . Sleep apnea   . Myocardial infarction ([place])   . Hyperlipidemia 05/27/2015  . GERD (gastroesophageal reflux disease) 05/27/2015  . AKI (acute kidney injury) ([place]) 08/18/2015    Social History   Social History  . Marital Status: Widowed    Spouse Name: [name]  . Number of Children: [name]  . Years of Education: [name]   Occupational  History  . Not on file.   Social  History Main Topics  . Smoking status: Never Smoker   . Smokeless tobacco: Never Used  . Alcohol Use: No  . Drug Use: No  . Sexual Activity: Not on file   Other Topics Concern  . Not on file   Social History Narrative    Past Surgical History  Procedure Laterality Date  . Coronary angioplasty with stent placement    . Knee surgery    . Knee surgery      Knee replacement x2  . Cardioversion [name] 08/28/2015    Procedure: CARDIOVERSION;  Surgeon: Jerline Pain, MD;  Location: [facility];  Service: Cardiovascular;  Laterality: [name];  . Cardiac catheterization [name] 10/02/2015    Procedure: Left Heart Cath and Coronary Angiography;  Surgeon: Leonie Man, MD;  Location: [facility];  Service: Cardiovascular;  Laterality: [name];    Family History  Problem Relation Age of Onset  . Cancer Mother   . Diabetes Father   . Heart attack Brother   . Heart attack Sister     Allergies  Allergen Reactions  . Percocet [Oxycodone-Acetaminophen] Diarrhea, Nausea And Vomiting and Nausea Only  . Sulfa Antibiotics Diarrhea and Nausea And Vomiting    Current Outpatient Prescriptions on File Prior to Visit  Medication Sig Dispense Refill  . acetaminophen (TYLENOL) 325 MG tablet Take 650 mg by mouth every 6 (six) hours as needed for mild pain or headache.    . albuterol (PROVENTIL HFA;VENTOLIN HFA) 108 (90 BASE) MCG/ACT inhaler Inhale 2 puffs into the lungs every 4 (four) hours as needed for wheezing or shortness of breath.     . allopurinol (ZYLOPRIM) 100 MG tablet Take 1 tablet (100 mg total) by mouth 2 (two) times daily. 60 tablet 1  . aspirin EC 81 MG tablet Take 81 mg by mouth daily.    Marland Kitchen ELIQUIS 5 MG TABS tablet Take 5 mg by mouth 2 (two) times daily.  0  . fluticasone (FLONASE) 50 MCG/ACT nasal spray Place 2 sprays into both nostrils daily. 16 g 1  . furosemide (LASIX) 40 MG tablet Take 1 tablet by mouth two  times daily 60 tablet 1  . gabapentin (NEURONTIN) 600 MG tablet Take 1  tablet (600 mg total) by mouth 3 (three) times daily. 30 tablet 0  . glimepiride (AMARYL) 2 MG tablet Take 1 tablet (2 mg total) by mouth daily. 30 tablet 3  . mometasone-formoterol (DULERA) 200-5 MCG/ACT AERO Inhale 2 puffs into the lungs 2 (two) times daily. 13 g 1  . montelukast (SINGULAIR) 10 MG tablet Take 1 tablet (10 mg total) by mouth at bedtime. 30 tablet 0  . NON FORMULARY Take 15 each by mouth Nightly. CPAP at night    . omeprazole (PRILOSEC) 20 MG capsule Take 1 capsule (20 mg total) by mouth daily. 30 capsule 1  . potassium chloride (K-DUR) 10 MEQ tablet Take 1 tablet by mouth (10  mEq) two times daily 60 tablet 1  . potassium chloride (K-DUR,KLOR-CON) 10 MEQ tablet Take 1 tablet (10 mEq total) by mouth 2 (two) times daily. 30 tablet 0  . simvastatin (ZOCOR) 20 MG tablet Take 1 tablet (20 mg total) by mouth daily. 30 tablet 1  . tiotropium (SPIRIVA) 18 MCG inhalation capsule Place 1 capsule (18 mcg total) into inhaler and inhale daily. 30 capsule 12   No current facility-administered medications on file prior to visit.    BP 118/70 mmHg  Pulse 86  Temp(Src) 98.1 F (36.7 C) (Oral)  Ht 6' (1.829 m)  Wt 281 lb 6.4 oz (127.642 kg)  BMI 38.16 kg/m2  SpO2 96%       Objective:   Physical Exam  General Mental Status- Alert. General Appearance- Not in acute distress.   Skin General: Color- Normal Color. Moisture- Normal Moisture.  Neck Carotid Arteries- Normal color. Moisture- Normal Moisture. No carotid bruits. No JVD.  Chest and Lung Exam Auscultation: Breath Sounds: even unlabored. Mild shallow  Cardiovascular Auscultation:Rythm- Regular.(did not sound to be fibrillation when I auscultated) Murmurs & Other Heart Sounds:Auscultation of the heart reveals- No Murmurs.  Abdomen Inspection:-Inspeection Normal. Palpation/Percussion:Note:No mass. Palpation and Percussion of the abdomen reveal- Non Tender, Non Distended + BS, no rebound or  guarding.    Neurologic Cranial Nerve exam:- CN III-XII intact(No nystagmus), symmetric smile. Strength:- 5/5 equal and symmetric strength both upper and lower extremities.  Lower ext-  1+ pedal edema bilateral . Negative homans sign.      Assessment & Plan:  We did give you tdap and psv 23 vaccine today.  Pneumonia resolved.  Will get labs today to see if heart failure flare. Also get chest xray.  For diabetes get urine microalbumin. Last a1-c 6.2 end of november.  Continue current medications. Follow up with pulmonologist. Consider use of 02 at home since you declined on discharge.  Follow up in 2-3 weeks or as needed

## 2015-10-06 NOTE — Telephone Encounter (Signed)
On Christopher Holder. Want him to come in on Friday before weekend in light of xray read possible pneumonia.  Early am appointment.

## 2015-10-07 ENCOUNTER — Ambulatory Visit (INDEPENDENT_AMBULATORY_CARE_PROVIDER_SITE_OTHER): Payer: Medicare Other | Admitting: Behavioral Health

## 2015-10-07 ENCOUNTER — Telehealth: Payer: Self-pay | Admitting: Medical

## 2015-10-07 ENCOUNTER — Encounter: Payer: Self-pay | Admitting: Acute Care

## 2015-10-07 ENCOUNTER — Ambulatory Visit (INDEPENDENT_AMBULATORY_CARE_PROVIDER_SITE_OTHER): Payer: Medicare Other | Admitting: Acute Care

## 2015-10-07 VITALS — BP 104/68 | HR 61 | Temp 97.9°F | Ht 72.0 in | Wt 281.0 lb

## 2015-10-07 DIAGNOSIS — J189 Pneumonia, unspecified organism: Secondary | ICD-10-CM | POA: Diagnosis not present

## 2015-10-07 DIAGNOSIS — J438 Other emphysema: Secondary | ICD-10-CM | POA: Diagnosis not present

## 2015-10-07 DIAGNOSIS — G4733 Obstructive sleep apnea (adult) (pediatric): Secondary | ICD-10-CM

## 2015-10-07 LAB — MICROALBUMIN, URINE: Microalb, Ur: 0.5 mg/dL

## 2015-10-07 MED ORDER — AZITHROMYCIN 250 MG PO TABS: ORAL_TABLET | ORAL | Status: DC

## 2015-10-07 MED ORDER — CEFTRIAXONE SODIUM 1 G IJ SOLR
1.0000 g | Freq: Once | INTRAMUSCULAR | Status: AC
  Administered 2015-10-07: 1 g via INTRAMUSCULAR

## 2015-10-07 MED FILL — AZITHROMYCIN 250 MG TABLET: 250 | 5 days supply | Qty: 6 | Fill #0

## 2015-10-07 NOTE — Assessment & Plan Note (Signed)
Emphysema continues to be controlled with Singulair, Spiriva, and Dulera.  Pt. States he is taking them every day as prescribed .Continues to take Prilosec daily. Pt has been taking his Pro Air every 4 hours as a scheduled med, not as a rescue medication.  Plan: Educated patient on the difference between maintenance medications and rescue medications. Explained that the [employer] is his rescue med and should be taken when needed for shortness of breath. Please contact office for sooner follow up if symptoms do not improve or worsen or seek emergency care

## 2015-10-07 NOTE — Progress Notes (Signed)
Pre visit review using our clinic review tool, if applicable. No additional management support is needed unless otherwise documented below in the visit note.  Pt in for Rocephin injection. Pt tolerated injection well.

## 2015-10-07 NOTE — Patient Instructions (Addendum)
Continue the Singulair/ Spiriva/ and Dulera every day as prescribed. These are your maintenance medicines and should be taken every day without fail. Take your Pro Air as needed for shortness of breath.  This is your rescue medicine. We are going to walk you in the office today to check your oxygen levels with activity. Your oxygen levels did not drop below 95% on room air, so we will not order home oxygen today. Continue using your CPAP every night, for at least 4-6 hours. Follow up with Mackie Pai, PA for antibiotic injection today at 1:30, as your pneumonia has not completely cleared. Continue your physical therapy. Call if there is any sign of fever or worsening of respiratory status so you can be seen sooner. Follow up with Dr. Lamonte Sakai in 4 weeks , sooner if there is any worsening in your condition. Please contact office for sooner follow up if symptoms do not improve or worsen or seek emergency care

## 2015-10-07 NOTE — Assessment & Plan Note (Signed)
Continues to use CPAP every night. Feels better in the morning after using CPAP. Pt. Clearly benefiting from treatment. Plan:  Continue on CPAP at bedtime. You appear to be benefiting from the treatment Goal is to wear for at least 4-6 hours each night for maximal clinical benefit. Continue to work on weight loss, as the link between excess weight  and sleep apnea is well established.  Referral to sleep doctor to establish care since patient has relocated from [place] and needs to get established with MD in [place].

## 2015-10-07 NOTE — Telephone Encounter (Signed)
Pt coming in today 1:30 for inj with nurse.

## 2015-10-07 NOTE — Telephone Encounter (Signed)
Pt is going to get azithromycin downstairs today since I accidentally sent in rx mail order. He will get rocephin 1 gram today. Also over next 2 days he will take lasix 1 tablet twice daily. He states he has only been taking one tab a day. He will update [place] on Friday how he is doing with sob on ambulating. Plan to get cxr in 2 weeks. This is recommended by his pulmonologist. But may get xray sooner depending on how he is doing.

## 2015-10-07 NOTE — Telephone Encounter (Signed)
Please have pt come in for an appointment this Friday morning if possible.

## 2015-10-07 NOTE — Progress Notes (Signed)
Subjective:    Patient ID: Christopher Holder, male    DOB: Dec 16, 1929, 82 y.o.   MRN: [id number]  HPI 82 yo male with recent hospital admission for  PAF and new decrease in EF, with troponin peak of 0000000 + chronic systolic CHF- with reported EF of 35-40% , HTN, HLD, DM, CKD stage III, OSA -with CPAP, asthma vs. COPD admitted for sepsis and presumed CAP leading to acute respiratory failure requiring intubation 08/18/15 with extubation 08/21/15.Marland Kitchen Echo with EF now 25-30%.   Significant Studies Events: 08/18/15-09/02/2015 : Hospital admission for Acute hypoxic respiratory failure ,Community-acquired pneumonia, COPD/asthma requiring intubation 08/18/15-08/21/15 08/22/2015 Echocardiogram  shows EF of 25-30% 08/28/2015: Cardioversion 09/02/15: Discharged to a skilled Nursing facility 10/06/2015: CXR   Again noted bilateral interstitial prominence. Mild interstitial edema or pneumonitis cannot be excluded. There is streaky superimposed airspace opacification right mid lung, bilateral lower lobe and left upper lobe peripheral. These are highly suspicious for superimposed infiltrate/ pneumonia.   10/07/15 Hospital Follow Up: Patient present to the office today stating continued slow improvement of acute respiratory failure with multi factorial etiology( COPD/ asthma, CAP/Acute on chronic systolic heart failure/cardiomyopathy). He continues to have shortness of breath on exertion. We walked him in the office today on room air, and he maintained his SATS with a low of 95%. He visited his PCP yesterday and chest x-ray is suspicious for pneumonia. He has an appointment today at [facility] office for antibiotic injection to be followed by oral dosing and follow up CXR in 2 weeks.He is wearing his CPAP each night.He has an appointment with Dr. Lamonte Sakai 11/16/15 with PFT's prior.   has a past medical history of Diabetes mellitus without complication ([place]); CHF (congestive heart failure) ([place]); Coronary artery  disease; COPD (chronic obstructive pulmonary disease) ([place]); Asthma; Hypertension; HOH (hard of hearing); Sleep apnea; Myocardial infarction ([place]); Hyperlipidemia (05/27/2015); GERD (gastroesophageal reflux disease) (05/27/2015); and AKI (acute kidney injury) ([place]) (08/18/2015).    Current outpatient prescriptions:  .  acetaminophen (TYLENOL) 325 MG tablet, Take 650 mg by mouth every 6 (six) hours as needed for mild pain or headache., Disp: , Rfl:  .  albuterol (PROVENTIL HFA;VENTOLIN HFA) 108 (90 BASE) MCG/ACT inhaler, Inhale 2 puffs into the lungs every 4 (four) hours as needed for wheezing or shortness of breath. , Disp: , Rfl:  .  allopurinol (ZYLOPRIM) 100 MG tablet, Take 1 tablet (100 mg total) by mouth 2 (two) times daily., Disp: 60 tablet, Rfl: 1 .  aspirin EC 81 MG tablet, Take 81 mg by mouth daily., Disp: , Rfl:  .  ELIQUIS 5 MG TABS tablet, Take 5 mg by mouth 2 (two) times daily., Disp: , Rfl: 0 .  fluticasone (FLONASE) 50 MCG/ACT nasal spray, Place 2 sprays into both nostrils daily., Disp: 16 g, Rfl: 1 .  furosemide (LASIX) 40 MG tablet, Take 1 tablet by mouth two  times daily, Disp: 60 tablet, Rfl: 1 .  gabapentin (NEURONTIN) 600 MG tablet, Take 1 tablet (600 mg total) by mouth 3 (three) times daily., Disp: 30 tablet, Rfl: 0 .  glimepiride (AMARYL) 2 MG tablet, Take 1 tablet (2 mg total) by mouth daily., Disp: 30 tablet, Rfl: 3 .  mometasone-formoterol (DULERA) 200-5 MCG/ACT AERO, Inhale 2 puffs into the lungs 2 (two) times daily., Disp: 13 g, Rfl: 1 .  montelukast (SINGULAIR) 10 MG tablet, Take 1 tablet (10 mg total) by mouth at bedtime., Disp: 30 tablet, Rfl: 0 .  NON FORMULARY, Take 15 each by mouth  Nightly. CPAP at night, Disp: , Rfl:  .  omeprazole (PRILOSEC) 20 MG capsule, Take 1 capsule (20 mg total) by mouth daily., Disp: 30 capsule, Rfl: 1 .  potassium chloride (K-DUR,KLOR-CON) 10 MEQ tablet, Take 1 tablet (10 mEq total) by mouth 2 (two) times daily., Disp: 30 tablet, Rfl: 0 .   simvastatin (ZOCOR) 20 MG tablet, Take 1 tablet (20 mg total) by mouth daily., Disp: 30 tablet, Rfl: 1 .  tiotropium (SPIRIVA) 18 MCG inhalation capsule, Place 1 capsule (18 mcg total) into inhaler and inhale daily., Disp: 30 capsule, Rfl: 12 .  azithromycin (ZITHROMAX) 250 MG tablet, Take 2 tablets by mouth on day 1, followed by 1 tablet by mouth daily for 4 days., Disp: 6 tablet, Rfl: 0  Allergies  Allergen Reactions  . Percocet [Oxycodone-Acetaminophen] Diarrhea, Nausea And Vomiting and Nausea Only  . Sulfa Antibiotics Diarrhea and Nausea And Vomiting     Review of Systems    .Constitutional:   No  weight loss, night sweats,  Fevers, chills, fatigue, or  lassitude.  HEENT:   No headaches,  Difficulty swallowing,  Tooth/dental problems, or  Sore throat,                No sneezing, itching, ear ache, nasal congestion, post nasal drip,   CV:  No chest pain,  Orthopnea, PND, slight swelling in lower extremities, no anasarca, dizziness, palpitations, syncope.   GI  No heartburn, indigestion, abdominal pain, nausea, vomiting, diarrhea, change in bowel habits, loss of appetite, bloody stools.   Resp: + shortness of breath with exertion not at rest.  No excess mucus, no productive cough,  No non-productive cough,  No coughing up of blood.  No change in color of mucus.  No wheezing.  No chest wall deformity  Skin: no rash or lesions.  GU: no dysuria, change in color of urine, no urgency or frequency.  No flank pain, no hematuria   MS:  No joint pain or swelling.  No decreased range of motion.  No back pain.  Psych:  No change in mood or affect. No depression or anxiety.  No memory loss.     Objective:   Physical Exam  BP 104/68 mmHg  Pulse 61  Temp(Src) 97.9 F (36.6 C) (Oral)  Ht 6' (1.829 m)  Wt 281 lb (127.461 kg)  BMI 38.10 kg/m2  SpO2 95% Physical Exam:  General Disposition; no distress  A&Ox3 ENT: No sinus tenderness, TM clear, pale nasal mucosa, no oral exudate,no post  nasal drip, no LAN Cardiac: S1, S2, regular rate and rhythm, no murmur Chest: No wheeze/ rales/  +dullness bases; no accessory muscle use, no nasal flaring, no sternal retractions, exertional dyspnea Abd.: Soft Non-tender Ext: 1+ edema Neuro:  deconditioned Skin: No rashes, warm and dry Psych: normal mood and behavior         Assessment & Plan:

## 2015-10-07 NOTE — Telephone Encounter (Signed)
Regarding me sending his azithromycin to his mail order pharmacy. Let pt know that I can send the prescription down stairs to our pharmacy. It would cost $9 dollars. Since mail order already sent out azithromycin they won't pay. But pharmacy price cheaper than normal due to circumstances. Let him know this when he comes in for rocephin today. Regarding the one coming in mail. You could have that on hand and we could advise him to use in near future if other infection arises. I just don't want him to go without antibiotic in light of recent hospitalization  for pneumonia. When he comes in for rocephin please explain this. When he is in for injection I can explain to pt and his grandaughter if you want.

## 2015-10-07 NOTE — Assessment & Plan Note (Addendum)
Continued streaky superimposed airspace opacification right mid lung, bilateral lower lobe and left upper lobe peripheral.  These are highly suspicious for superimposed infiltrate/ pneumonia. Continued SOB with exertion. Plan: Follow up with Mackie Pai, PA for antibiotic injection today at 1:30 as scheduled, and continued antibiotic treatment  as your pneumonia has not completely cleared. Call for any increase in cough, colorful sputum, fever, worsening shortness of breath. Walk in office today to check for desaturations, assess need for home oxygen. Call if there is any sign of fever or worsening of respiratory status so you can be seen sooner. Follow up with Mackie Pai, PA  in 2 weeks for follow up CXR, sooner if patient does not improve. Follow up with Dr. Lamonte Sakai in 4 weeks , sooner if there is any worsening in your condition.

## 2015-10-08 DIAGNOSIS — J449 Chronic obstructive pulmonary disease, unspecified: Secondary | ICD-10-CM | POA: Diagnosis not present

## 2015-10-08 DIAGNOSIS — M6281 Muscle weakness (generalized): Secondary | ICD-10-CM | POA: Diagnosis not present

## 2015-10-08 DIAGNOSIS — R262 Difficulty in walking, not elsewhere classified: Secondary | ICD-10-CM | POA: Diagnosis not present

## 2015-10-08 DIAGNOSIS — I480 Paroxysmal atrial fibrillation: Secondary | ICD-10-CM | POA: Diagnosis not present

## 2015-10-09 ENCOUNTER — Ambulatory Visit (INDEPENDENT_AMBULATORY_CARE_PROVIDER_SITE_OTHER): Payer: Medicare Other | Admitting: Internal Medicine

## 2015-10-09 ENCOUNTER — Encounter: Payer: Self-pay | Admitting: Internal Medicine

## 2015-10-09 VITALS — BP 110/42 | HR 60 | Ht 72.0 in | Wt 277.0 lb

## 2015-10-09 DIAGNOSIS — I429 Cardiomyopathy, unspecified: Secondary | ICD-10-CM | POA: Diagnosis not present

## 2015-10-09 DIAGNOSIS — R001 Bradycardia, unspecified: Secondary | ICD-10-CM | POA: Diagnosis not present

## 2015-10-09 DIAGNOSIS — R262 Difficulty in walking, not elsewhere classified: Secondary | ICD-10-CM | POA: Diagnosis not present

## 2015-10-09 DIAGNOSIS — I5022 Chronic systolic (congestive) heart failure: Secondary | ICD-10-CM

## 2015-10-09 DIAGNOSIS — I428 Other cardiomyopathies: Secondary | ICD-10-CM

## 2015-10-09 DIAGNOSIS — M6281 Muscle weakness (generalized): Secondary | ICD-10-CM | POA: Diagnosis not present

## 2015-10-09 DIAGNOSIS — I480 Paroxysmal atrial fibrillation: Secondary | ICD-10-CM | POA: Diagnosis not present

## 2015-10-09 DIAGNOSIS — J449 Chronic obstructive pulmonary disease, unspecified: Secondary | ICD-10-CM | POA: Diagnosis not present

## 2015-10-09 NOTE — Patient Instructions (Signed)
Medication Instructions:  Your physician has recommended you make the following change in your medication:  1) STOP Aspirin   Labwork: None ordered   Testing/Procedures: None ordered   Follow-Up: Your physician wants you to follow-up in: 11/06/15 with Dr. Marlou Porch and as needed with Dr. Rayann Heman  Any Other Special Instructions Will Be Listed Below (If Applicable).     If you need a refill on your cardiac medications before your next appointment, please call your pharmacy.

## 2015-10-09 NOTE — Progress Notes (Signed)
Electrophysiology Office Note   Date:  10/09/2015   ID:  Christopher Holder, DOB [birth year], MRN [id number]  PCP:  Mackie Pai, PA-C  Cardiologist:  Dr Marlou Porch (referring) Primary Electrophysiologist: Thompson Grayer, MD    Chief Complaint  Patient presents with  . PAF     History of Present Illness: Christopher Holder is a 82 y.o. male who presents today for electrophysiology evaluation.   The patient has nonischemic CM, chronic lung disease, and recently diagnosed atrial fibrillation and atrial flutter.  He is unsteady and walks with a walker.  He also has poor hearing.  He has advanced age.   He recently has had both atrial fibrillation and atrial flutter for which he has largely been asymptomatic.  He is presently anticoagulated with eliquis.  He previously did have some hemoptysis which has resolved.  He is followed in pulmonary clinic for chronic lung disease.  He was previously on beta blockers but did have some sinus tachycardia for which they were discontinued.  He is presently without any symptoms of bradycardia. Today, he denies symptoms of palpitations, chest pain,  orthopnea, PND,  claudication, dizziness, presyncope, syncope, bleeding, or neurologic sequela.  He has chronic stable edema.  He also has chronic lung disease with exertional SOB.  The patient is tolerating medications without difficulties and is otherwise without complaint today.    Past Medical History  Diagnosis Date  . Diabetes mellitus without complication ([place])   . CHF (congestive heart failure) ([place])   . Coronary artery disease   . COPD (chronic obstructive pulmonary disease) ([place])   . Asthma   . Hypertension   . HOH (hard of hearing)   . Sleep apnea   . Myocardial infarction ([place])   . Hyperlipidemia 05/27/2015  . GERD (gastroesophageal reflux disease) 05/27/2015  . AKI (acute kidney injury) ([place]) 08/18/2015   Past Surgical History  Procedure Laterality Date  . Coronary angioplasty with stent placement    .  Knee surgery    . Knee surgery      Knee replacement x2  . Cardioversion [name] 08/28/2015    Procedure: CARDIOVERSION;  Surgeon: Jerline Pain, MD;  Location: [facility];  Service: Cardiovascular;  Laterality: [name];  . Cardiac catheterization [name] 10/02/2015    Procedure: Left Heart Cath and Coronary Angiography;  Surgeon: Leonie Man, MD;  Location: [facility];  Service: Cardiovascular;  Laterality: [name];     Current Outpatient Prescriptions  Medication Sig Dispense Refill  . acetaminophen (TYLENOL) 325 MG tablet Take 650 mg by mouth every 6 (six) hours as needed for mild pain or headache.    . albuterol (PROVENTIL HFA;VENTOLIN HFA) 108 (90 BASE) MCG/ACT inhaler Inhale 2 puffs into the lungs every 4 (four) hours as needed for wheezing or shortness of breath.     . allopurinol (ZYLOPRIM) 100 MG tablet Take 1 tablet (100 mg total) by mouth 2 (two) times daily. 60 tablet 1  . aspirin EC 81 MG tablet Take 81 mg by mouth daily.    Marland Kitchen azithromycin (ZITHROMAX) 250 MG tablet Take 2 tablets by mouth on day 1, followed by 1 tablet by mouth daily for 4 days. 6 tablet 0  . ELIQUIS 5 MG TABS tablet Take 5 mg by mouth 2 (two) times daily.  0  . fluticasone (FLONASE) 50 MCG/ACT nasal spray Place 2 sprays into both nostrils daily. 16 g 1  . furosemide (LASIX) 40 MG tablet Take 1 tablet by mouth two  times daily 60  tablet 1  . gabapentin (NEURONTIN) 600 MG tablet Take 1 tablet (600 mg total) by mouth 3 (three) times daily. 30 tablet 0  . glimepiride (AMARYL) 2 MG tablet Take 1 tablet (2 mg total) by mouth daily. 30 tablet 3  . mometasone-formoterol (DULERA) 200-5 MCG/ACT AERO Inhale 2 puffs into the lungs 2 (two) times daily. 13 g 1  . montelukast (SINGULAIR) 10 MG tablet Take 1 tablet (10 mg total) by mouth at bedtime. 30 tablet 0  . NON FORMULARY Take 15 each by mouth Nightly. CPAP at night    . omeprazole (PRILOSEC) 20 MG capsule Take 1 capsule (20 mg total) by mouth daily. 30 capsule 1  .  potassium chloride (K-DUR,KLOR-CON) 10 MEQ tablet Take 1 tablet (10 mEq total) by mouth 2 (two) times daily. 30 tablet 0  . simvastatin (ZOCOR) 20 MG tablet Take 1 tablet (20 mg total) by mouth daily. 30 tablet 1  . tiotropium (SPIRIVA) 18 MCG inhalation capsule Place 1 capsule (18 mcg total) into inhaler and inhale daily. 30 capsule 12   No current facility-administered medications for this visit.    Allergies:   Percocet and Sulfa antibiotics   Social History:  The patient  reports that he has never smoked. He has never used smokeless tobacco. He reports that he does not drink alcohol or use illicit drugs.   Family History:  The patient's  family history includes Cancer in his mother; Diabetes in his father; Heart attack in his brother and sister.    ROS:  Please see the history of present illness.   All other systems are reviewed and negative.    PHYSICAL EXAM: VS:  BP 110/42 mmHg  Pulse 60  Ht 6' (1.829 m)  Wt 277 lb (125.646 kg)  BMI 37.56 kg/m2 , BMI Body mass index is 37.56 kg/(m^2). GEN: elderly, overweight, in no acute distress HEENT: normalexcept for very poor hearing Neck: no JVD, carotid bruits, or masses Cardiac: RRR; +2 chronic edema Respiratory:  Prolonged expiratory phase, few basilar rales,  normal work of breathing GI: soft, nontender, nondistended, + BS MS: no deformity or atrophy Skin: warm and dry  Neuro:  Strength and sensation are intact, poor hearing Psych: euthymic mood, full affect Walks slowly with a walker  EKG:  EKG is ordered today. The ekg ordered today shows sinus rhythm with PVCs, PR 204, LBB (QRS 120-130)   Recent Labs: 08/26/2015: B Natriuretic Peptide 1054.2* 08/30/2015: Magnesium 2.7* 09/29/2015: Hemoglobin 12.0*; Platelets 222 10/06/2015: ALT 16; BUN 15; Creatinine, Ser 1.09; Potassium 4.2; Pro B Natriuretic peptide (BNP) 474.0*; Sodium 144    Lipid Panel  No results found for: CHOL, TRIG, HDL, CHOLHDL, VLDL, LDLCALC, LDLDIRECT   Wt  Readings from Last 3 Encounters:  10/09/15 277 lb (125.646 kg)  10/07/15 281 lb (127.461 kg)  10/06/15 281 lb 6.4 oz (127.642 kg)      Other studies Reviewed: Additional studies/ records that were reviewed today include: Dietrich Pates note, prior cath note, recent echo Review of the above records today demonstrates: no CAD by recent cath, moderate AI, EF 25%   ASSESSMENT AND PLAN:  1.  Paroxysmal atrial fibrillation and atrial flutter Minimally symptomatic and well rate controlled chads2vasc score is at least 6.  He is appropriate anticoagulated with eliquis Stop ASA I do not feel that currently he would receive additional benefit from AAD therapy No changes today  2. Sinus bradycardia Apparently he did have some bradycardia on beta blockers Currently heart rates appear  good.  He has no symptoms of bradycardia.  He does have advanced conduction system disease on ekg. Though he may one day need a pacemaker, I do not feel that this is currently indicated Would follow conservatively with Dr Marlou Porch  3. Chronic systolic dysfunction Medical mangement per Dr Marlou Porch Not a candidate for an ICD due to advanced age Currently, I do not feel that with QRS of only 120-130 msec that he would receive any benefit from CRT  No EP procedures advised at this time Return to see me as needed  Follow-up with Dr Marlou Porch as scheduled  Current medicines are reviewed at length with the patient today.   The patient does not have concerns regarding his medicines.  The following changes were made today:  none  Signed, Thompson Grayer, MD  10/09/2015 10:33 AM     [facility] [address] [place]  [address] [phone] (office) [phone] (fax)

## 2015-10-12 ENCOUNTER — Other Ambulatory Visit: Payer: Self-pay

## 2015-10-12 DIAGNOSIS — R262 Difficulty in walking, not elsewhere classified: Secondary | ICD-10-CM | POA: Diagnosis not present

## 2015-10-12 DIAGNOSIS — M6281 Muscle weakness (generalized): Secondary | ICD-10-CM | POA: Diagnosis not present

## 2015-10-12 DIAGNOSIS — I480 Paroxysmal atrial fibrillation: Secondary | ICD-10-CM | POA: Diagnosis not present

## 2015-10-12 DIAGNOSIS — J449 Chronic obstructive pulmonary disease, unspecified: Secondary | ICD-10-CM | POA: Diagnosis not present

## 2015-10-12 MED ORDER — ELIQUIS 5 MG PO TABS: 5.0000 mg | ORAL_TABLET | Freq: Two times a day (BID) | ORAL | Status: AC

## 2015-10-12 NOTE — Telephone Encounter (Signed)
Rx filled 10/12/15.

## 2015-10-13 DIAGNOSIS — R262 Difficulty in walking, not elsewhere classified: Secondary | ICD-10-CM | POA: Diagnosis not present

## 2015-10-13 DIAGNOSIS — I480 Paroxysmal atrial fibrillation: Secondary | ICD-10-CM | POA: Diagnosis not present

## 2015-10-13 DIAGNOSIS — J449 Chronic obstructive pulmonary disease, unspecified: Secondary | ICD-10-CM | POA: Diagnosis not present

## 2015-10-13 DIAGNOSIS — M6281 Muscle weakness (generalized): Secondary | ICD-10-CM | POA: Diagnosis not present

## 2015-10-15 ENCOUNTER — Ambulatory Visit: Payer: Medicare Other | Admitting: Emergency Medicine

## 2015-10-15 DIAGNOSIS — J449 Chronic obstructive pulmonary disease, unspecified: Secondary | ICD-10-CM | POA: Diagnosis not present

## 2015-10-15 DIAGNOSIS — I480 Paroxysmal atrial fibrillation: Secondary | ICD-10-CM | POA: Diagnosis not present

## 2015-10-15 DIAGNOSIS — R262 Difficulty in walking, not elsewhere classified: Secondary | ICD-10-CM | POA: Diagnosis not present

## 2015-10-15 DIAGNOSIS — M6281 Muscle weakness (generalized): Secondary | ICD-10-CM | POA: Diagnosis not present

## 2015-10-16 DIAGNOSIS — J449 Chronic obstructive pulmonary disease, unspecified: Secondary | ICD-10-CM | POA: Diagnosis not present

## 2015-10-16 DIAGNOSIS — I480 Paroxysmal atrial fibrillation: Secondary | ICD-10-CM | POA: Diagnosis not present

## 2015-10-16 DIAGNOSIS — R262 Difficulty in walking, not elsewhere classified: Secondary | ICD-10-CM | POA: Diagnosis not present

## 2015-10-16 DIAGNOSIS — M6281 Muscle weakness (generalized): Secondary | ICD-10-CM | POA: Diagnosis not present

## 2015-10-19 DIAGNOSIS — R262 Difficulty in walking, not elsewhere classified: Secondary | ICD-10-CM | POA: Diagnosis not present

## 2015-10-19 DIAGNOSIS — J449 Chronic obstructive pulmonary disease, unspecified: Secondary | ICD-10-CM | POA: Diagnosis not present

## 2015-10-19 DIAGNOSIS — I480 Paroxysmal atrial fibrillation: Secondary | ICD-10-CM | POA: Diagnosis not present

## 2015-10-19 DIAGNOSIS — M6281 Muscle weakness (generalized): Secondary | ICD-10-CM | POA: Diagnosis not present

## 2015-10-20 ENCOUNTER — Other Ambulatory Visit: Payer: Self-pay | Admitting: Medical

## 2015-10-20 DIAGNOSIS — R262 Difficulty in walking, not elsewhere classified: Secondary | ICD-10-CM | POA: Diagnosis not present

## 2015-10-20 DIAGNOSIS — M6281 Muscle weakness (generalized): Secondary | ICD-10-CM | POA: Diagnosis not present

## 2015-10-20 DIAGNOSIS — I480 Paroxysmal atrial fibrillation: Secondary | ICD-10-CM | POA: Diagnosis not present

## 2015-10-20 DIAGNOSIS — J449 Chronic obstructive pulmonary disease, unspecified: Secondary | ICD-10-CM | POA: Diagnosis not present

## 2015-10-21 ENCOUNTER — Ambulatory Visit (HOSPITAL_BASED_OUTPATIENT_CLINIC_OR_DEPARTMENT_OTHER)
Admission: RE | Admit: 2015-10-21 | Discharge: 2015-10-21 | Disposition: A | Discharge status: Home / Self Care | Payer: Medicare Other | Source: Ambulatory Visit | Attending: Medical | Admitting: Medical

## 2015-10-21 ENCOUNTER — Telehealth: Payer: Self-pay | Admitting: Medical

## 2015-10-21 ENCOUNTER — Encounter: Payer: Self-pay | Admitting: Medical

## 2015-10-21 ENCOUNTER — Ambulatory Visit (INDEPENDENT_AMBULATORY_CARE_PROVIDER_SITE_OTHER): Payer: Medicare Other | Admitting: Medical

## 2015-10-21 VITALS — BP 118/78 | HR 78 | Temp 97.7°F | Ht 72.0 in | Wt 276.6 lb

## 2015-10-21 DIAGNOSIS — R0602 Shortness of breath: Secondary | ICD-10-CM | POA: Insufficient documentation

## 2015-10-21 DIAGNOSIS — J189 Pneumonia, unspecified organism: Secondary | ICD-10-CM

## 2015-10-21 DIAGNOSIS — R42 Dizziness and giddiness: Secondary | ICD-10-CM | POA: Diagnosis not present

## 2015-10-21 DIAGNOSIS — I5022 Chronic systolic (congestive) heart failure: Secondary | ICD-10-CM | POA: Diagnosis not present

## 2015-10-21 DIAGNOSIS — I517 Cardiomegaly: Secondary | ICD-10-CM | POA: Insufficient documentation

## 2015-10-21 DIAGNOSIS — R0600 Dyspnea, unspecified: Secondary | ICD-10-CM

## 2015-10-21 LAB — COMPREHENSIVE METABOLIC PANEL
ALK PHOS: 78 U/L (ref 39–117)
ALT: 17 U/L (ref 0–53)
AST: 18 U/L (ref 0–37)
Albumin: 3.8 g/dL (ref 3.5–5.2)
BILIRUBIN TOTAL: 0.5 mg/dL (ref 0.2–1.2)
BUN: 24 mg/dL — AB (ref 6–23)
CO2: 30 mEq/L (ref 19–32)
Calcium: 9.1 mg/dL (ref 8.4–10.5)
Chloride: 103 mEq/L (ref 96–112)
Creatinine, Ser: 1.37 mg/dL (ref 0.40–1.50)
GFR: 52.7 mL/min — ABNORMAL LOW (ref >60.00)
GLUCOSE: 83 mg/dL (ref 70–99)
Potassium: 3.8 mEq/L (ref 3.5–5.1)
SODIUM: 142 meq/L (ref 135–145)
TOTAL PROTEIN: 6.9 g/dL (ref 6.0–8.3)

## 2015-10-21 LAB — BRAIN NATRIURETIC PEPTIDE: Pro B Natriuretic peptide (BNP): 318 pg/mL — ABNORMAL HIGH (ref 0.0–100.0)

## 2015-10-21 NOTE — Telephone Encounter (Signed)
Edward see note below and advise.

## 2015-10-21 NOTE — Progress Notes (Signed)
Pre visit review using our clinic review tool, if applicable. No additional management support is needed unless otherwise documented below in the visit note. 

## 2015-10-21 NOTE — Telephone Encounter (Signed)
Gave verbal approval for skilled nursing(talked with Anderson Malta at [facility]). With his chf ,education on disease management and follow up good idea. She send over order and I will sign.

## 2015-10-21 NOTE — Patient Instructions (Addendum)
Will get cxr to evaluate if any residual pneumonia.  For hx of chf will get cxr as well. Will get cmp and bnp today. Then determine if any changes on dose oh his  meds.  You appear alot better clinically.  Continue to monitor weights daily. If rapid weight gain, increase sob, or edema of legs then notify us.  Follow up date to be determined after labs and xray  back.

## 2015-10-21 NOTE — Telephone Encounter (Signed)
Caller name: Anderson Malta with [name] Can be reached: [phone]  Reason for call: Please call with verbal for Skilled Nursing for disease mgmt

## 2015-10-21 NOTE — Progress Notes (Signed)
Subjective:    Patient ID: Christopher Holder, male    DOB: Apr 25, 1930, 82 y.o.   MRN: [id number]  HPI  Pt in for follow up. Pt in states he feels good today(see ros). He is in for check after being on antibiotics post hospitalization. I repeated chest cxr and showed possible pneumonia. I gave zpack and rocephin injection.  It has been 8 days since on he finished antibiotic.  Pt also has hx of cardiomyopathy and a- Fib. Pt weight 271 on 10-10-2015 and now 268.3.(Pt states has new batteries.)    Review of Systems  Constitutional: Negative for fever, chills and fatigue.  Respiratory: Negative for cough, chest tightness, shortness of breath and wheezing.   Cardiovascular: Negative for chest pain and palpitations.  Neurological: Negative for dizziness and headaches.  Hematological: Negative for adenopathy. Does not bruise/bleed easily.  Psychiatric/Behavioral: Negative for behavioral problems and confusion.   Past Medical History  Diagnosis Date  . Diabetes mellitus without complication ([place])   . CHF (congestive heart failure) ([place])   . Coronary artery disease   . COPD (chronic obstructive pulmonary disease) ([place])   . Asthma   . Hypertension   . HOH (hard of hearing)   . Sleep apnea   . Myocardial infarction ([place])   . Hyperlipidemia 05/27/2015  . GERD (gastroesophageal reflux disease) 05/27/2015  . AKI (acute kidney injury) ([place]) 08/18/2015    Social History   Social History  . Marital Status: Widowed    Spouse Name: [name]  . Number of Children: [name]  . Years of Education: [name]   Occupational History  . Not on file.   Social History Main Topics  . Smoking status: Never Smoker   . Smokeless tobacco: Never Used  . Alcohol Use: No  . Drug Use: No  . Sexual Activity: Not on file   Other Topics Concern  . Not on file   Social History Narrative    Past Surgical History  Procedure Laterality Date  . Coronary angioplasty with stent placement    . Knee surgery    . Knee surgery       Knee replacement x2  . Cardioversion [name] 08/28/2015    Procedure: CARDIOVERSION;  Surgeon: Jerline Pain, MD;  Location: [facility];  Service: Cardiovascular;  Laterality: [name];  . Cardiac catheterization [name] 10/02/2015    Procedure: Left Heart Cath and Coronary Angiography;  Surgeon: Leonie Man, MD;  Location: [facility];  Service: Cardiovascular;  Laterality: [name];    Family History  Problem Relation Age of Onset  . Cancer Mother   . Diabetes Father   . Heart attack Brother   . Heart attack Sister     Allergies  Allergen Reactions  . Percocet [Oxycodone-Acetaminophen] Diarrhea, Nausea And Vomiting and Nausea Only  . Sulfa Antibiotics Diarrhea and Nausea And Vomiting    Current Outpatient Prescriptions on File Prior to Visit  Medication Sig Dispense Refill  . acetaminophen (TYLENOL) 325 MG tablet Take 650 mg by mouth every 6 (six) hours as needed for mild pain or headache.    . albuterol (PROVENTIL HFA;VENTOLIN HFA) 108 (90 BASE) MCG/ACT inhaler Inhale 2 puffs into the lungs every 4 (four) hours as needed for wheezing or shortness of breath.     . allopurinol (ZYLOPRIM) 100 MG tablet Take 1 tablet by mouth two  times daily 120 tablet 1  . azithromycin (ZITHROMAX) 250 MG tablet Take 2 tablets by mouth on day 1, followed by 1 tablet by  mouth daily for 4 days. 6 tablet 0  . ELIQUIS 5 MG TABS tablet Take 1 tablet (5 mg total) by mouth 2 (two) times daily. 120 tablet 2  . fluticasone (FLONASE) 50 MCG/ACT nasal spray Place 2 sprays into both nostrils daily. 16 g 1  . furosemide (LASIX) 40 MG tablet Take 1 tablet by mouth two  times daily 60 tablet 1  . gabapentin (NEURONTIN) 600 MG tablet Take 1 tablet (600 mg total) by mouth 3 (three) times daily. 30 tablet 0  . gabapentin (NEURONTIN) 600 MG tablet Take 1 tablet by mouth 3  times daily 180 tablet 1  . glimepiride (AMARYL) 2 MG tablet Take 1 tablet (2 mg total) by mouth daily. 30 tablet 3  . mometasone-formoterol (DULERA)  200-5 MCG/ACT AERO Inhale 2 puffs into the lungs 2 (two) times daily. 13 g 1  . montelukast (SINGULAIR) 10 MG tablet Take 1 tablet (10 mg total) by mouth at bedtime. 30 tablet 0  . NON FORMULARY Take 15 each by mouth Nightly. CPAP at night    . omeprazole (PRILOSEC) 20 MG capsule Take 1 capsule by mouth  daily 60 capsule 1  . potassium chloride (K-DUR,KLOR-CON) 10 MEQ tablet Take 1 tablet (10 mEq total) by mouth 2 (two) times daily. 30 tablet 0  . simvastatin (ZOCOR) 20 MG tablet Take 1 tablet by mouth  daily 30 tablet 2  . tiotropium (SPIRIVA) 18 MCG inhalation capsule Place 1 capsule (18 mcg total) into inhaler and inhale daily. 30 capsule 12   No current facility-administered medications on file prior to visit.    BP 118/78 mmHg  Pulse 78  Temp(Src) 97.7 F (36.5 C) (Oral)  Ht 6' (1.829 m)  Wt 276 lb 9.6 oz (125.465 kg)  BMI 37.51 kg/m2  SpO2 96%       Objective:   Physical Exam   General  Mental Status - Alert. General Appearance - Well groomed. Not in acute distress.  Skin Rashes- No Rashes.  HEENT Head- Normal. Ear Auditory Canal - Left- Normal. Right - Normal.Tympanic Membrane- Left- Normal. Right- Normal. Eye Sclera/Conjunctiva- Left- Normal. Right- Normal. Nose & Sinuses Nasal Mucosa- Left-  Not boggy or Congested. Right-  Not  boggy or Congested. Mouth & Throat Lips: Upper Lip- Normal: no dryness, cracking, pallor, cyanosis, or vesicular eruption. Lower Lip-Normal: no dryness, cracking, pallor, cyanosis or vesicular eruption. Buccal Mucosa- Bilateral- No Aphthous ulcers. Oropharynx- No Discharge or Erythema. Tonsils: Characteristics- Bilateral- No Erythema or Congestion. Size/Enlargement- Bilateral- No enlargement. Discharge- bilateral-None.  Neck Neck- Supple. No Masses.   Chest and Lung Exam Auscultation: Breath Sounds:- even and unlabored.  Cardiovascular Auscultation:Rythm- Regular, rate and rhythm. Murmurs & Other Heart Sounds:Ausculatation of the  heart reveal- No Murmurs.  Lymphatic Head & Neck General Head & Neck Lymphatics: Bilateral: Description- No Localized lymphadenopathy.   Lower ext- no pedal edema. Negative homans signs     Assessment & Plan:  Will get cxr to evaluate if any residual pneumonia.  For hx of chf will get cxr as well. Will get cmp and bnp today. Then determine if any changes on dose oh his  meds.  Pt  appear alot better clinically.  Continue to monitor weights daily. If rapid weight gain, increase sob, or edema of legs then notify us.  Follow up date to be determined after labs and xray back.

## 2015-10-22 DIAGNOSIS — R262 Difficulty in walking, not elsewhere classified: Secondary | ICD-10-CM | POA: Diagnosis not present

## 2015-10-22 DIAGNOSIS — M6281 Muscle weakness (generalized): Secondary | ICD-10-CM | POA: Diagnosis not present

## 2015-10-22 DIAGNOSIS — I480 Paroxysmal atrial fibrillation: Secondary | ICD-10-CM | POA: Diagnosis not present

## 2015-10-22 DIAGNOSIS — J449 Chronic obstructive pulmonary disease, unspecified: Secondary | ICD-10-CM | POA: Diagnosis not present

## 2015-10-23 DIAGNOSIS — J449 Chronic obstructive pulmonary disease, unspecified: Secondary | ICD-10-CM | POA: Diagnosis not present

## 2015-10-23 DIAGNOSIS — M6281 Muscle weakness (generalized): Secondary | ICD-10-CM | POA: Diagnosis not present

## 2015-10-23 DIAGNOSIS — I480 Paroxysmal atrial fibrillation: Secondary | ICD-10-CM | POA: Diagnosis not present

## 2015-10-23 DIAGNOSIS — R262 Difficulty in walking, not elsewhere classified: Secondary | ICD-10-CM | POA: Diagnosis not present

## 2015-10-25 DIAGNOSIS — R262 Difficulty in walking, not elsewhere classified: Secondary | ICD-10-CM | POA: Diagnosis not present

## 2015-10-25 DIAGNOSIS — I480 Paroxysmal atrial fibrillation: Secondary | ICD-10-CM | POA: Diagnosis not present

## 2015-10-25 DIAGNOSIS — J449 Chronic obstructive pulmonary disease, unspecified: Secondary | ICD-10-CM | POA: Diagnosis not present

## 2015-10-25 DIAGNOSIS — M6281 Muscle weakness (generalized): Secondary | ICD-10-CM | POA: Diagnosis not present

## 2015-10-26 ENCOUNTER — Telehealth: Payer: Self-pay

## 2015-10-26 DIAGNOSIS — R262 Difficulty in walking, not elsewhere classified: Secondary | ICD-10-CM | POA: Diagnosis not present

## 2015-10-26 DIAGNOSIS — I480 Paroxysmal atrial fibrillation: Secondary | ICD-10-CM | POA: Diagnosis not present

## 2015-10-26 DIAGNOSIS — M6281 Muscle weakness (generalized): Secondary | ICD-10-CM | POA: Diagnosis not present

## 2015-10-26 DIAGNOSIS — J449 Chronic obstructive pulmonary disease, unspecified: Secondary | ICD-10-CM | POA: Diagnosis not present

## 2015-10-26 DIAGNOSIS — D3132 Benign neoplasm of left choroid: Secondary | ICD-10-CM | POA: Diagnosis not present

## 2015-10-26 DIAGNOSIS — H3563 Retinal hemorrhage, bilateral: Secondary | ICD-10-CM | POA: Diagnosis not present

## 2015-10-26 NOTE — Telephone Encounter (Signed)
Spoke with Parke Poisson at [facility] and gave verbal orders per ES to continue occupational therapy for pt.

## 2015-10-28 ENCOUNTER — Telehealth: Payer: Self-pay | Admitting: *Deleted

## 2015-10-28 ENCOUNTER — Ambulatory Visit (INDEPENDENT_AMBULATORY_CARE_PROVIDER_SITE_OTHER): Payer: Medicare Other | Admitting: Podiatry

## 2015-10-28 ENCOUNTER — Encounter: Payer: Self-pay | Admitting: Podiatry

## 2015-10-28 VITALS — Ht 72.0 in | Wt 276.0 lb

## 2015-10-28 DIAGNOSIS — J449 Chronic obstructive pulmonary disease, unspecified: Secondary | ICD-10-CM | POA: Diagnosis not present

## 2015-10-28 DIAGNOSIS — I480 Paroxysmal atrial fibrillation: Secondary | ICD-10-CM | POA: Diagnosis not present

## 2015-10-28 DIAGNOSIS — B351 Tinea unguium: Secondary | ICD-10-CM | POA: Diagnosis not present

## 2015-10-28 DIAGNOSIS — M79606 Pain in leg, unspecified: Secondary | ICD-10-CM

## 2015-10-28 DIAGNOSIS — R262 Difficulty in walking, not elsewhere classified: Secondary | ICD-10-CM | POA: Diagnosis not present

## 2015-10-28 DIAGNOSIS — M6281 Muscle weakness (generalized): Secondary | ICD-10-CM | POA: Diagnosis not present

## 2015-10-28 NOTE — Telephone Encounter (Signed)
Received [place]; forwarded to provider/SLS 02/08

## 2015-10-28 NOTE — Patient Instructions (Signed)
Seen for hypertrophic nails. All nails debrided. Return in 3 months or as needed.  

## 2015-10-28 NOTE — Progress Notes (Signed)
SUBJECTIVE: 82 y.o. year old male accompanied by his daughter presents for diabetic foot care.  Nails are thick and hurt to walk. Also has callus under the ball of right foot.  Taking 600 mg Gabapentin twice a day and still having burning pain on feet.  OBJECTIVE: DERMATOLOGIC EXAMINATION: Dystrophic and hypertrophic nails x 10. Digital corn 5th digit right, plantar callus under 3rd MPJ right. VASCULAR EXAMINATION OF LOWER LIMBS: Pedal pulses: All pedal pulses are palpable with normal pulsation.  Positive of mild pedal edema left foot.  Temperature gradient from tibial crest to dorsum of foot is within normal bilateral. NEUROLOGIC EXAMINATION OF THE LOWER LIMBS: All epicritic and tactile sensations grossly intact.  MUSCULOSKELETAL EXAMINATION: Contracted 5th digit with digital corn at distal lateral right foot. Plantar flexed 3rd metatarsal right.  ASSESSMENT: Thick dystrophic nail plate symptomatic.  Hypertrophic nails x 10.  HT deformity 5th right. Plantar flexed 3rd metatarsal right with plantar callus. NIDDM under control. Diabetic neuropathy.   PLAN: Reviewed findings and available treatment options. All nails debrided  May benefit from diabetic shoes to add extra cushion and wide forefoot. Return in 3 months or sooner if needed

## 2015-10-29 DIAGNOSIS — J449 Chronic obstructive pulmonary disease, unspecified: Secondary | ICD-10-CM | POA: Diagnosis not present

## 2015-10-29 DIAGNOSIS — R262 Difficulty in walking, not elsewhere classified: Secondary | ICD-10-CM | POA: Diagnosis not present

## 2015-10-29 DIAGNOSIS — I480 Paroxysmal atrial fibrillation: Secondary | ICD-10-CM | POA: Diagnosis not present

## 2015-10-29 DIAGNOSIS — M6281 Muscle weakness (generalized): Secondary | ICD-10-CM | POA: Diagnosis not present

## 2015-10-30 DIAGNOSIS — R262 Difficulty in walking, not elsewhere classified: Secondary | ICD-10-CM | POA: Diagnosis not present

## 2015-10-30 DIAGNOSIS — M6281 Muscle weakness (generalized): Secondary | ICD-10-CM | POA: Diagnosis not present

## 2015-10-30 DIAGNOSIS — J449 Chronic obstructive pulmonary disease, unspecified: Secondary | ICD-10-CM | POA: Diagnosis not present

## 2015-10-30 DIAGNOSIS — I480 Paroxysmal atrial fibrillation: Secondary | ICD-10-CM | POA: Diagnosis not present

## 2015-10-31 ENCOUNTER — Other Ambulatory Visit: Payer: Self-pay | Admitting: Medical

## 2015-11-01 ENCOUNTER — Other Ambulatory Visit: Payer: Self-pay | Admitting: Medical

## 2015-11-02 DIAGNOSIS — J449 Chronic obstructive pulmonary disease, unspecified: Secondary | ICD-10-CM | POA: Diagnosis not present

## 2015-11-02 DIAGNOSIS — I480 Paroxysmal atrial fibrillation: Secondary | ICD-10-CM | POA: Diagnosis not present

## 2015-11-02 DIAGNOSIS — R262 Difficulty in walking, not elsewhere classified: Secondary | ICD-10-CM | POA: Diagnosis not present

## 2015-11-02 DIAGNOSIS — M6281 Muscle weakness (generalized): Secondary | ICD-10-CM | POA: Diagnosis not present

## 2015-11-03 DIAGNOSIS — M6281 Muscle weakness (generalized): Secondary | ICD-10-CM | POA: Diagnosis not present

## 2015-11-03 DIAGNOSIS — R262 Difficulty in walking, not elsewhere classified: Secondary | ICD-10-CM | POA: Diagnosis not present

## 2015-11-03 DIAGNOSIS — I480 Paroxysmal atrial fibrillation: Secondary | ICD-10-CM | POA: Diagnosis not present

## 2015-11-03 DIAGNOSIS — J449 Chronic obstructive pulmonary disease, unspecified: Secondary | ICD-10-CM | POA: Diagnosis not present

## 2015-11-04 ENCOUNTER — Telehealth: Payer: Self-pay | Admitting: Medical

## 2015-11-04 DIAGNOSIS — J449 Chronic obstructive pulmonary disease, unspecified: Secondary | ICD-10-CM | POA: Diagnosis not present

## 2015-11-04 DIAGNOSIS — R262 Difficulty in walking, not elsewhere classified: Secondary | ICD-10-CM | POA: Diagnosis not present

## 2015-11-04 DIAGNOSIS — M6281 Muscle weakness (generalized): Secondary | ICD-10-CM | POA: Diagnosis not present

## 2015-11-04 DIAGNOSIS — I480 Paroxysmal atrial fibrillation: Secondary | ICD-10-CM | POA: Diagnosis not present

## 2015-11-04 NOTE — Telephone Encounter (Signed)
Caller name:Jennifer Relationship to patient:[facility] Can be reached:[phone] Pharmacy:  Reason for call:she would like an extension of physical therapy for the next two weeks for 2 times per week

## 2015-11-04 NOTE — Telephone Encounter (Signed)
Verbal authorization given to [facility] per below request due to pt's CHF. They will fax order to the office.

## 2015-11-05 ENCOUNTER — Telehealth: Payer: Self-pay | Admitting: Medical

## 2015-11-05 DIAGNOSIS — M6281 Muscle weakness (generalized): Secondary | ICD-10-CM | POA: Diagnosis not present

## 2015-11-05 DIAGNOSIS — J449 Chronic obstructive pulmonary disease, unspecified: Secondary | ICD-10-CM | POA: Diagnosis not present

## 2015-11-05 DIAGNOSIS — I480 Paroxysmal atrial fibrillation: Secondary | ICD-10-CM | POA: Diagnosis not present

## 2015-11-05 DIAGNOSIS — R262 Difficulty in walking, not elsewhere classified: Secondary | ICD-10-CM | POA: Diagnosis not present

## 2015-11-05 NOTE — Telephone Encounter (Signed)
Caller name: Pam Relationship to patient: Home Health Nurse Can be reached: [phone]   Reason for call: [facility] would like an order for a Social Worker to assist with placement of patient. States patient is having a hard time managing at home

## 2015-11-05 NOTE — Telephone Encounter (Signed)
Edward see note below and advise.

## 2015-11-05 NOTE — Telephone Encounter (Signed)
Christopher Holder wants an order from [place] for social work. So patient can be placed. He is having hard time at home. I don't know how to order this in epic. Can you ask Maudie Mercury if she knows. Or can we do verbal order with [place]?? I imaging way to do in epic but I am not sure/never done in epic.

## 2015-11-05 NOTE — Telephone Encounter (Signed)
[phone] is [name] number to call and ask about social work referral.

## 2015-11-05 NOTE — Telephone Encounter (Signed)
Noted and agree. 

## 2015-11-06 ENCOUNTER — Ambulatory Visit: Payer: Medicare Other | Admitting: Cardiology

## 2015-11-06 DIAGNOSIS — R262 Difficulty in walking, not elsewhere classified: Secondary | ICD-10-CM | POA: Diagnosis not present

## 2015-11-06 DIAGNOSIS — I480 Paroxysmal atrial fibrillation: Secondary | ICD-10-CM | POA: Diagnosis not present

## 2015-11-06 DIAGNOSIS — M6281 Muscle weakness (generalized): Secondary | ICD-10-CM | POA: Diagnosis not present

## 2015-11-06 DIAGNOSIS — J449 Chronic obstructive pulmonary disease, unspecified: Secondary | ICD-10-CM | POA: Diagnosis not present

## 2015-11-09 ENCOUNTER — Telehealth: Payer: Self-pay | Admitting: *Deleted

## 2015-11-09 NOTE — Telephone Encounter (Signed)
Received Interim Order; forwarded to provider/SLS 02/20

## 2015-11-09 NOTE — Telephone Encounter (Signed)
Go ahead and approve order/verbal order for social work.

## 2015-11-09 NOTE — Telephone Encounter (Signed)
Spoke with Christopher Holder and she will need a FL2 form and a order for a Education officer, museum. The fax number is [phone] ATTN: Christopher Holder. She is needing a verbal order for a Education officer, museum and grandaughter would like placement in a nursing home for round the clock care. Please advise.

## 2015-11-09 NOTE — Telephone Encounter (Signed)
Left message for pt to call back  °

## 2015-11-10 ENCOUNTER — Telehealth: Payer: Self-pay | Admitting: *Deleted

## 2015-11-10 ENCOUNTER — Other Ambulatory Visit: Payer: Self-pay | Admitting: Medical

## 2015-11-10 DIAGNOSIS — R262 Difficulty in walking, not elsewhere classified: Secondary | ICD-10-CM | POA: Diagnosis not present

## 2015-11-10 DIAGNOSIS — I480 Paroxysmal atrial fibrillation: Secondary | ICD-10-CM | POA: Diagnosis not present

## 2015-11-10 DIAGNOSIS — M6281 Muscle weakness (generalized): Secondary | ICD-10-CM | POA: Diagnosis not present

## 2015-11-10 DIAGNOSIS — J449 Chronic obstructive pulmonary disease, unspecified: Secondary | ICD-10-CM | POA: Diagnosis not present

## 2015-11-10 NOTE — Telephone Encounter (Signed)
Received Interim Orders; forwarded to provider/SLS 02/21

## 2015-11-11 DIAGNOSIS — R262 Difficulty in walking, not elsewhere classified: Secondary | ICD-10-CM | POA: Diagnosis not present

## 2015-11-11 DIAGNOSIS — M6281 Muscle weakness (generalized): Secondary | ICD-10-CM | POA: Diagnosis not present

## 2015-11-11 DIAGNOSIS — I480 Paroxysmal atrial fibrillation: Secondary | ICD-10-CM | POA: Diagnosis not present

## 2015-11-11 DIAGNOSIS — J449 Chronic obstructive pulmonary disease, unspecified: Secondary | ICD-10-CM | POA: Diagnosis not present

## 2015-11-11 NOTE — Telephone Encounter (Deleted)
Spoke with Christopher Holder who is the legal guardian and she states that Christopher Holder's insurance has changed and he will need test strips and supplies from [employer] and he will need a prodigy meter with test strips. I was advised that Christopher Holder has started the process on Tuesday afternoon. I have left a message for a pharmacist to call back with a fax number to send in the correct information for the new meter and test strips. Awaiting call and paperwork if needed.

## 2015-11-12 ENCOUNTER — Other Ambulatory Visit: Payer: Self-pay

## 2015-11-12 DIAGNOSIS — M6281 Muscle weakness (generalized): Secondary | ICD-10-CM | POA: Diagnosis not present

## 2015-11-12 DIAGNOSIS — J449 Chronic obstructive pulmonary disease, unspecified: Secondary | ICD-10-CM | POA: Diagnosis not present

## 2015-11-12 DIAGNOSIS — R262 Difficulty in walking, not elsewhere classified: Secondary | ICD-10-CM | POA: Diagnosis not present

## 2015-11-12 DIAGNOSIS — I480 Paroxysmal atrial fibrillation: Secondary | ICD-10-CM | POA: Diagnosis not present

## 2015-11-12 NOTE — Telephone Encounter (Signed)
Forms faxed to [facility] on 11/12/15.

## 2015-11-12 NOTE — Telephone Encounter (Signed)
Filled out forms for ot, pt, and skilled nursing. Gave to [place] to fax over.

## 2015-11-12 NOTE — Telephone Encounter (Signed)
Previous note is in error  

## 2015-11-12 NOTE — Addendum Note (Signed)
Addended by: Tasia Catchings on: 11/12/2015 09:18 AM   Modules accepted: Orders, Medications

## 2015-11-13 ENCOUNTER — Other Ambulatory Visit: Payer: Self-pay | Admitting: Medical

## 2015-11-13 DIAGNOSIS — M6281 Muscle weakness (generalized): Secondary | ICD-10-CM | POA: Diagnosis not present

## 2015-11-13 DIAGNOSIS — I480 Paroxysmal atrial fibrillation: Secondary | ICD-10-CM | POA: Diagnosis not present

## 2015-11-13 DIAGNOSIS — J449 Chronic obstructive pulmonary disease, unspecified: Secondary | ICD-10-CM | POA: Diagnosis not present

## 2015-11-13 DIAGNOSIS — R262 Difficulty in walking, not elsewhere classified: Secondary | ICD-10-CM | POA: Diagnosis not present

## 2015-11-16 ENCOUNTER — Ambulatory Visit: Payer: Medicare Other | Admitting: Emergency Medicine

## 2015-11-17 DIAGNOSIS — G4733 Obstructive sleep apnea (adult) (pediatric): Secondary | ICD-10-CM | POA: Diagnosis not present

## 2015-11-17 DIAGNOSIS — E1140 Type 2 diabetes mellitus with diabetic neuropathy, unspecified: Secondary | ICD-10-CM | POA: Diagnosis not present

## 2015-11-17 DIAGNOSIS — Z7984 Long term (current) use of oral hypoglycemic drugs: Secondary | ICD-10-CM | POA: Diagnosis not present

## 2015-11-17 DIAGNOSIS — R262 Difficulty in walking, not elsewhere classified: Secondary | ICD-10-CM | POA: Diagnosis not present

## 2015-11-17 DIAGNOSIS — I480 Paroxysmal atrial fibrillation: Secondary | ICD-10-CM | POA: Diagnosis not present

## 2015-11-17 DIAGNOSIS — N183 Chronic kidney disease, stage 3 (moderate): Secondary | ICD-10-CM | POA: Diagnosis not present

## 2015-11-17 DIAGNOSIS — M6281 Muscle weakness (generalized): Secondary | ICD-10-CM | POA: Diagnosis not present

## 2015-11-17 DIAGNOSIS — J449 Chronic obstructive pulmonary disease, unspecified: Secondary | ICD-10-CM | POA: Diagnosis not present

## 2015-11-17 DIAGNOSIS — Z7982 Long term (current) use of aspirin: Secondary | ICD-10-CM | POA: Diagnosis not present

## 2015-11-17 DIAGNOSIS — I5022 Chronic systolic (congestive) heart failure: Secondary | ICD-10-CM | POA: Diagnosis not present

## 2015-11-17 DIAGNOSIS — K219 Gastro-esophageal reflux disease without esophagitis: Secondary | ICD-10-CM | POA: Diagnosis not present

## 2015-11-17 DIAGNOSIS — E785 Hyperlipidemia, unspecified: Secondary | ICD-10-CM | POA: Diagnosis not present

## 2015-11-18 DIAGNOSIS — N183 Chronic kidney disease, stage 3 (moderate): Secondary | ICD-10-CM | POA: Diagnosis not present

## 2015-11-18 DIAGNOSIS — M6281 Muscle weakness (generalized): Secondary | ICD-10-CM | POA: Diagnosis not present

## 2015-11-18 DIAGNOSIS — R262 Difficulty in walking, not elsewhere classified: Secondary | ICD-10-CM | POA: Diagnosis not present

## 2015-11-18 DIAGNOSIS — Z7982 Long term (current) use of aspirin: Secondary | ICD-10-CM | POA: Diagnosis not present

## 2015-11-18 DIAGNOSIS — E1140 Type 2 diabetes mellitus with diabetic neuropathy, unspecified: Secondary | ICD-10-CM | POA: Diagnosis not present

## 2015-11-18 DIAGNOSIS — G4733 Obstructive sleep apnea (adult) (pediatric): Secondary | ICD-10-CM | POA: Diagnosis not present

## 2015-11-18 DIAGNOSIS — J449 Chronic obstructive pulmonary disease, unspecified: Secondary | ICD-10-CM | POA: Diagnosis not present

## 2015-11-18 DIAGNOSIS — K219 Gastro-esophageal reflux disease without esophagitis: Secondary | ICD-10-CM | POA: Diagnosis not present

## 2015-11-18 DIAGNOSIS — Z7984 Long term (current) use of oral hypoglycemic drugs: Secondary | ICD-10-CM | POA: Diagnosis not present

## 2015-11-18 DIAGNOSIS — I5022 Chronic systolic (congestive) heart failure: Secondary | ICD-10-CM | POA: Diagnosis not present

## 2015-11-18 DIAGNOSIS — I480 Paroxysmal atrial fibrillation: Secondary | ICD-10-CM | POA: Diagnosis not present

## 2015-11-18 DIAGNOSIS — E785 Hyperlipidemia, unspecified: Secondary | ICD-10-CM | POA: Diagnosis not present

## 2015-11-19 DIAGNOSIS — Z7982 Long term (current) use of aspirin: Secondary | ICD-10-CM | POA: Diagnosis not present

## 2015-11-19 DIAGNOSIS — Z7984 Long term (current) use of oral hypoglycemic drugs: Secondary | ICD-10-CM | POA: Diagnosis not present

## 2015-11-19 DIAGNOSIS — I5022 Chronic systolic (congestive) heart failure: Secondary | ICD-10-CM | POA: Diagnosis not present

## 2015-11-19 DIAGNOSIS — E1140 Type 2 diabetes mellitus with diabetic neuropathy, unspecified: Secondary | ICD-10-CM | POA: Diagnosis not present

## 2015-11-19 DIAGNOSIS — I480 Paroxysmal atrial fibrillation: Secondary | ICD-10-CM | POA: Diagnosis not present

## 2015-11-19 DIAGNOSIS — N183 Chronic kidney disease, stage 3 (moderate): Secondary | ICD-10-CM | POA: Diagnosis not present

## 2015-11-19 DIAGNOSIS — K219 Gastro-esophageal reflux disease without esophagitis: Secondary | ICD-10-CM | POA: Diagnosis not present

## 2015-11-19 DIAGNOSIS — R262 Difficulty in walking, not elsewhere classified: Secondary | ICD-10-CM | POA: Diagnosis not present

## 2015-11-19 DIAGNOSIS — G4733 Obstructive sleep apnea (adult) (pediatric): Secondary | ICD-10-CM | POA: Diagnosis not present

## 2015-11-19 DIAGNOSIS — M6281 Muscle weakness (generalized): Secondary | ICD-10-CM | POA: Diagnosis not present

## 2015-11-19 DIAGNOSIS — E785 Hyperlipidemia, unspecified: Secondary | ICD-10-CM | POA: Diagnosis not present

## 2015-11-19 DIAGNOSIS — J449 Chronic obstructive pulmonary disease, unspecified: Secondary | ICD-10-CM | POA: Diagnosis not present

## 2015-11-20 ENCOUNTER — Telehealth: Payer: Self-pay | Admitting: Medical

## 2015-11-20 NOTE — Telephone Encounter (Signed)
Left message for Pam to call back.

## 2015-11-20 NOTE — Telephone Encounter (Signed)
Caller name: Pam Relationship to patient: [facility] Can be reached: [phone]    Reason for call: Need an order to recertify patient and continue care

## 2015-11-23 NOTE — Telephone Encounter (Signed)
Caller name: Pam  Relationship to patient: [facility]  Can be reached: [phone]   Please call with verbal order to continue [facility] nursing for med mgmt and disease management for 60 days. Goal is to place pt at skilled nursing facility. OK to leave on confidential voicemail.

## 2015-11-23 NOTE — Telephone Encounter (Signed)
Spoke with Jeannene Patella at [facility] and gave verbal ok from PCP for 60 days.

## 2015-11-24 ENCOUNTER — Telehealth: Payer: Self-pay | Admitting: *Deleted

## 2015-11-24 DIAGNOSIS — J449 Chronic obstructive pulmonary disease, unspecified: Secondary | ICD-10-CM | POA: Diagnosis not present

## 2015-11-24 DIAGNOSIS — E1140 Type 2 diabetes mellitus with diabetic neuropathy, unspecified: Secondary | ICD-10-CM | POA: Diagnosis not present

## 2015-11-24 DIAGNOSIS — R262 Difficulty in walking, not elsewhere classified: Secondary | ICD-10-CM | POA: Diagnosis not present

## 2015-11-24 DIAGNOSIS — N183 Chronic kidney disease, stage 3 (moderate): Secondary | ICD-10-CM | POA: Diagnosis not present

## 2015-11-24 DIAGNOSIS — Z7982 Long term (current) use of aspirin: Secondary | ICD-10-CM | POA: Diagnosis not present

## 2015-11-24 DIAGNOSIS — M6281 Muscle weakness (generalized): Secondary | ICD-10-CM | POA: Diagnosis not present

## 2015-11-24 DIAGNOSIS — Z7984 Long term (current) use of oral hypoglycemic drugs: Secondary | ICD-10-CM | POA: Diagnosis not present

## 2015-11-24 DIAGNOSIS — E785 Hyperlipidemia, unspecified: Secondary | ICD-10-CM | POA: Diagnosis not present

## 2015-11-24 DIAGNOSIS — I480 Paroxysmal atrial fibrillation: Secondary | ICD-10-CM | POA: Diagnosis not present

## 2015-11-24 DIAGNOSIS — G4733 Obstructive sleep apnea (adult) (pediatric): Secondary | ICD-10-CM | POA: Diagnosis not present

## 2015-11-24 DIAGNOSIS — K219 Gastro-esophageal reflux disease without esophagitis: Secondary | ICD-10-CM | POA: Diagnosis not present

## 2015-11-24 DIAGNOSIS — I5022 Chronic systolic (congestive) heart failure: Secondary | ICD-10-CM | POA: Diagnosis not present

## 2015-11-24 NOTE — Telephone Encounter (Signed)
Received request for Plan of Care Orders; forwarded to provider/SLS 03/07

## 2015-11-24 NOTE — Telephone Encounter (Signed)
Received PT Summary D/C Report; forwarded to provider/SLS 03/07

## 2015-12-03 ENCOUNTER — Telehealth: Payer: Self-pay | Admitting: Medical

## 2015-12-03 DIAGNOSIS — Z7984 Long term (current) use of oral hypoglycemic drugs: Secondary | ICD-10-CM | POA: Diagnosis not present

## 2015-12-03 DIAGNOSIS — N183 Chronic kidney disease, stage 3 (moderate): Secondary | ICD-10-CM | POA: Diagnosis not present

## 2015-12-03 DIAGNOSIS — E1140 Type 2 diabetes mellitus with diabetic neuropathy, unspecified: Secondary | ICD-10-CM | POA: Diagnosis not present

## 2015-12-03 DIAGNOSIS — Z7982 Long term (current) use of aspirin: Secondary | ICD-10-CM | POA: Diagnosis not present

## 2015-12-03 DIAGNOSIS — G4733 Obstructive sleep apnea (adult) (pediatric): Secondary | ICD-10-CM | POA: Diagnosis not present

## 2015-12-03 DIAGNOSIS — I480 Paroxysmal atrial fibrillation: Secondary | ICD-10-CM | POA: Diagnosis not present

## 2015-12-03 DIAGNOSIS — K219 Gastro-esophageal reflux disease without esophagitis: Secondary | ICD-10-CM | POA: Diagnosis not present

## 2015-12-03 DIAGNOSIS — M6281 Muscle weakness (generalized): Secondary | ICD-10-CM | POA: Diagnosis not present

## 2015-12-03 DIAGNOSIS — I5022 Chronic systolic (congestive) heart failure: Secondary | ICD-10-CM | POA: Diagnosis not present

## 2015-12-03 DIAGNOSIS — J449 Chronic obstructive pulmonary disease, unspecified: Secondary | ICD-10-CM | POA: Diagnosis not present

## 2015-12-03 DIAGNOSIS — R262 Difficulty in walking, not elsewhere classified: Secondary | ICD-10-CM | POA: Diagnosis not present

## 2015-12-03 DIAGNOSIS — E785 Hyperlipidemia, unspecified: Secondary | ICD-10-CM | POA: Diagnosis not present

## 2015-12-03 NOTE — Telephone Encounter (Signed)
Caller name: Jeannene Patella with [name] Can be reached: [phone] [place]  Reason for call: calling to get orders for [facility] aide to come to pt home 2x for 7 weeks to assist with bathing. Ok to leave on answering mach.

## 2015-12-03 NOTE — Telephone Encounter (Signed)
Will you call home health pam at [facility]. Give verbal order ok for home health to go 2 times a week for 7 weeks to assist with bathing. Can they fax over order for me to sign. Or do I need to put this in epic?

## 2015-12-03 NOTE — Telephone Encounter (Signed)
Please advise 

## 2015-12-04 NOTE — Telephone Encounter (Signed)
Left message for Pam that PCP gave verbal orders to start home health 2 times a week for 7 weeks. Pam was also advised if we needed any paperwork to fax it over to the office. Pam also advised to call back with any questions.

## 2015-12-07 ENCOUNTER — Ambulatory Visit: Payer: Medicare Other | Admitting: Cardiology

## 2015-12-08 ENCOUNTER — Telehealth: Payer: Self-pay | Admitting: Medical

## 2015-12-08 ENCOUNTER — Other Ambulatory Visit: Payer: Self-pay | Admitting: Medical

## 2015-12-08 DIAGNOSIS — I130 Hypertensive heart and chronic kidney disease with heart failure and stage 1 through stage 4 chronic kidney disease, or unspecified chronic kidney disease: Secondary | ICD-10-CM | POA: Diagnosis not present

## 2015-12-08 DIAGNOSIS — Z882 Allergy status to sulfonamides status: Secondary | ICD-10-CM | POA: Diagnosis not present

## 2015-12-08 DIAGNOSIS — E785 Hyperlipidemia, unspecified: Secondary | ICD-10-CM | POA: Diagnosis not present

## 2015-12-08 DIAGNOSIS — I110 Hypertensive heart disease with heart failure: Secondary | ICD-10-CM | POA: Diagnosis not present

## 2015-12-08 DIAGNOSIS — G4733 Obstructive sleep apnea (adult) (pediatric): Secondary | ICD-10-CM | POA: Diagnosis not present

## 2015-12-08 DIAGNOSIS — R531 Weakness: Secondary | ICD-10-CM | POA: Diagnosis not present

## 2015-12-08 DIAGNOSIS — N179 Acute kidney failure, unspecified: Secondary | ICD-10-CM | POA: Diagnosis not present

## 2015-12-08 DIAGNOSIS — I2510 Atherosclerotic heart disease of native coronary artery without angina pectoris: Secondary | ICD-10-CM | POA: Diagnosis not present

## 2015-12-08 DIAGNOSIS — Z885 Allergy status to narcotic agent status: Secondary | ICD-10-CM | POA: Diagnosis not present

## 2015-12-08 DIAGNOSIS — I10 Essential (primary) hypertension: Secondary | ICD-10-CM | POA: Diagnosis not present

## 2015-12-08 DIAGNOSIS — J449 Chronic obstructive pulmonary disease, unspecified: Secondary | ICD-10-CM | POA: Diagnosis not present

## 2015-12-08 DIAGNOSIS — E1122 Type 2 diabetes mellitus with diabetic chronic kidney disease: Secondary | ICD-10-CM | POA: Diagnosis not present

## 2015-12-08 DIAGNOSIS — H9190 Unspecified hearing loss, unspecified ear: Secondary | ICD-10-CM | POA: Diagnosis not present

## 2015-12-08 DIAGNOSIS — Z96653 Presence of artificial knee joint, bilateral: Secondary | ICD-10-CM | POA: Diagnosis not present

## 2015-12-08 DIAGNOSIS — I5022 Chronic systolic (congestive) heart failure: Secondary | ICD-10-CM | POA: Diagnosis not present

## 2015-12-08 DIAGNOSIS — N183 Chronic kidney disease, stage 3 (moderate): Secondary | ICD-10-CM | POA: Diagnosis not present

## 2015-12-08 DIAGNOSIS — Z974 Presence of external hearing-aid: Secondary | ICD-10-CM | POA: Diagnosis not present

## 2015-12-08 DIAGNOSIS — Z79899 Other long term (current) drug therapy: Secondary | ICD-10-CM | POA: Diagnosis not present

## 2015-12-08 DIAGNOSIS — I482 Chronic atrial fibrillation: Secondary | ICD-10-CM | POA: Diagnosis not present

## 2015-12-08 DIAGNOSIS — Z7982 Long term (current) use of aspirin: Secondary | ICD-10-CM | POA: Diagnosis not present

## 2015-12-08 DIAGNOSIS — R404 Transient alteration of awareness: Secondary | ICD-10-CM | POA: Diagnosis not present

## 2015-12-08 DIAGNOSIS — R0602 Shortness of breath: Secondary | ICD-10-CM | POA: Diagnosis not present

## 2015-12-08 DIAGNOSIS — I252 Old myocardial infarction: Secondary | ICD-10-CM | POA: Diagnosis not present

## 2015-12-08 DIAGNOSIS — R05 Cough: Secondary | ICD-10-CM | POA: Diagnosis not present

## 2015-12-08 DIAGNOSIS — I509 Heart failure, unspecified: Secondary | ICD-10-CM | POA: Diagnosis not present

## 2015-12-08 DIAGNOSIS — Z7901 Long term (current) use of anticoagulants: Secondary | ICD-10-CM | POA: Diagnosis not present

## 2015-12-08 DIAGNOSIS — G4730 Sleep apnea, unspecified: Secondary | ICD-10-CM | POA: Diagnosis not present

## 2015-12-08 DIAGNOSIS — I083 Combined rheumatic disorders of mitral, aortic and tricuspid valves: Secondary | ICD-10-CM | POA: Diagnosis not present

## 2015-12-08 DIAGNOSIS — R262 Difficulty in walking, not elsewhere classified: Secondary | ICD-10-CM | POA: Diagnosis not present

## 2015-12-08 DIAGNOSIS — J45909 Unspecified asthma, uncomplicated: Secondary | ICD-10-CM | POA: Diagnosis not present

## 2015-12-08 DIAGNOSIS — I5023 Acute on chronic systolic (congestive) heart failure: Secondary | ICD-10-CM | POA: Diagnosis not present

## 2015-12-08 DIAGNOSIS — I480 Paroxysmal atrial fibrillation: Secondary | ICD-10-CM | POA: Diagnosis not present

## 2015-12-08 DIAGNOSIS — Z7951 Long term (current) use of inhaled steroids: Secondary | ICD-10-CM | POA: Diagnosis not present

## 2015-12-08 DIAGNOSIS — M6281 Muscle weakness (generalized): Secondary | ICD-10-CM | POA: Diagnosis not present

## 2015-12-08 DIAGNOSIS — Z7984 Long term (current) use of oral hypoglycemic drugs: Secondary | ICD-10-CM | POA: Diagnosis not present

## 2015-12-08 DIAGNOSIS — Z955 Presence of coronary angioplasty implant and graft: Secondary | ICD-10-CM | POA: Diagnosis not present

## 2015-12-08 DIAGNOSIS — E1140 Type 2 diabetes mellitus with diabetic neuropathy, unspecified: Secondary | ICD-10-CM | POA: Diagnosis not present

## 2015-12-08 DIAGNOSIS — K219 Gastro-esophageal reflux disease without esophagitis: Secondary | ICD-10-CM | POA: Diagnosis not present

## 2015-12-08 NOTE — Telephone Encounter (Signed)
Caller name: Christopher Holder with [name] Can be reached: [phone]   Reason for call: Pt is feeling fatigued and tired. Increased HR of 103. Pam is in pt home and requesting urgent call back.

## 2015-12-08 NOTE — Telephone Encounter (Signed)
Per PCP pt will need to go to ER since he is having some Shortness of Breath, ankle swelling, elevated pulse and pt is feeling tired. Pam with [name] is going to call ambulance for pt.

## 2015-12-09 DIAGNOSIS — I482 Chronic atrial fibrillation: Secondary | ICD-10-CM | POA: Diagnosis not present

## 2015-12-09 DIAGNOSIS — Z974 Presence of external hearing-aid: Secondary | ICD-10-CM | POA: Diagnosis not present

## 2015-12-09 DIAGNOSIS — J45909 Unspecified asthma, uncomplicated: Secondary | ICD-10-CM | POA: Diagnosis not present

## 2015-12-09 DIAGNOSIS — E1122 Type 2 diabetes mellitus with diabetic chronic kidney disease: Secondary | ICD-10-CM | POA: Diagnosis not present

## 2015-12-09 DIAGNOSIS — Z7901 Long term (current) use of anticoagulants: Secondary | ICD-10-CM | POA: Diagnosis not present

## 2015-12-09 DIAGNOSIS — Z955 Presence of coronary angioplasty implant and graft: Secondary | ICD-10-CM | POA: Diagnosis not present

## 2015-12-09 DIAGNOSIS — G4730 Sleep apnea, unspecified: Secondary | ICD-10-CM | POA: Diagnosis not present

## 2015-12-09 DIAGNOSIS — Z79899 Other long term (current) drug therapy: Secondary | ICD-10-CM | POA: Diagnosis not present

## 2015-12-09 DIAGNOSIS — N179 Acute kidney failure, unspecified: Secondary | ICD-10-CM | POA: Diagnosis not present

## 2015-12-09 DIAGNOSIS — N183 Chronic kidney disease, stage 3 (moderate): Secondary | ICD-10-CM | POA: Diagnosis not present

## 2015-12-09 DIAGNOSIS — I252 Old myocardial infarction: Secondary | ICD-10-CM | POA: Diagnosis not present

## 2015-12-09 DIAGNOSIS — Z7951 Long term (current) use of inhaled steroids: Secondary | ICD-10-CM | POA: Diagnosis not present

## 2015-12-09 DIAGNOSIS — Z882 Allergy status to sulfonamides status: Secondary | ICD-10-CM | POA: Diagnosis not present

## 2015-12-09 DIAGNOSIS — H9190 Unspecified hearing loss, unspecified ear: Secondary | ICD-10-CM | POA: Diagnosis not present

## 2015-12-09 DIAGNOSIS — I5023 Acute on chronic systolic (congestive) heart failure: Secondary | ICD-10-CM | POA: Diagnosis not present

## 2015-12-09 DIAGNOSIS — Z885 Allergy status to narcotic agent status: Secondary | ICD-10-CM | POA: Diagnosis not present

## 2015-12-09 DIAGNOSIS — I130 Hypertensive heart and chronic kidney disease with heart failure and stage 1 through stage 4 chronic kidney disease, or unspecified chronic kidney disease: Secondary | ICD-10-CM | POA: Diagnosis not present

## 2015-12-09 DIAGNOSIS — Z96653 Presence of artificial knee joint, bilateral: Secondary | ICD-10-CM | POA: Diagnosis not present

## 2015-12-09 DIAGNOSIS — I2510 Atherosclerotic heart disease of native coronary artery without angina pectoris: Secondary | ICD-10-CM | POA: Diagnosis not present

## 2015-12-10 DIAGNOSIS — Z79899 Other long term (current) drug therapy: Secondary | ICD-10-CM | POA: Diagnosis not present

## 2015-12-10 DIAGNOSIS — G4730 Sleep apnea, unspecified: Secondary | ICD-10-CM | POA: Diagnosis not present

## 2015-12-10 DIAGNOSIS — I252 Old myocardial infarction: Secondary | ICD-10-CM | POA: Diagnosis not present

## 2015-12-10 DIAGNOSIS — I482 Chronic atrial fibrillation: Secondary | ICD-10-CM | POA: Diagnosis not present

## 2015-12-10 DIAGNOSIS — Z96653 Presence of artificial knee joint, bilateral: Secondary | ICD-10-CM | POA: Diagnosis not present

## 2015-12-10 DIAGNOSIS — I2510 Atherosclerotic heart disease of native coronary artery without angina pectoris: Secondary | ICD-10-CM | POA: Diagnosis not present

## 2015-12-10 DIAGNOSIS — E1122 Type 2 diabetes mellitus with diabetic chronic kidney disease: Secondary | ICD-10-CM | POA: Diagnosis not present

## 2015-12-10 DIAGNOSIS — N183 Chronic kidney disease, stage 3 (moderate): Secondary | ICD-10-CM | POA: Diagnosis not present

## 2015-12-10 DIAGNOSIS — I5023 Acute on chronic systolic (congestive) heart failure: Secondary | ICD-10-CM | POA: Diagnosis not present

## 2015-12-10 DIAGNOSIS — Z7901 Long term (current) use of anticoagulants: Secondary | ICD-10-CM | POA: Diagnosis not present

## 2015-12-10 DIAGNOSIS — Z882 Allergy status to sulfonamides status: Secondary | ICD-10-CM | POA: Diagnosis not present

## 2015-12-10 DIAGNOSIS — Z7951 Long term (current) use of inhaled steroids: Secondary | ICD-10-CM | POA: Diagnosis not present

## 2015-12-10 DIAGNOSIS — J45909 Unspecified asthma, uncomplicated: Secondary | ICD-10-CM | POA: Diagnosis not present

## 2015-12-10 DIAGNOSIS — I130 Hypertensive heart and chronic kidney disease with heart failure and stage 1 through stage 4 chronic kidney disease, or unspecified chronic kidney disease: Secondary | ICD-10-CM | POA: Diagnosis not present

## 2015-12-10 DIAGNOSIS — Z885 Allergy status to narcotic agent status: Secondary | ICD-10-CM | POA: Diagnosis not present

## 2015-12-10 DIAGNOSIS — Z955 Presence of coronary angioplasty implant and graft: Secondary | ICD-10-CM | POA: Diagnosis not present

## 2015-12-10 DIAGNOSIS — H9190 Unspecified hearing loss, unspecified ear: Secondary | ICD-10-CM | POA: Diagnosis not present

## 2015-12-10 DIAGNOSIS — N179 Acute kidney failure, unspecified: Secondary | ICD-10-CM | POA: Diagnosis not present

## 2015-12-10 DIAGNOSIS — Z974 Presence of external hearing-aid: Secondary | ICD-10-CM | POA: Diagnosis not present

## 2015-12-11 ENCOUNTER — Telehealth: Payer: Self-pay | Admitting: *Deleted

## 2015-12-11 DIAGNOSIS — I482 Chronic atrial fibrillation: Secondary | ICD-10-CM | POA: Diagnosis not present

## 2015-12-11 DIAGNOSIS — I252 Old myocardial infarction: Secondary | ICD-10-CM | POA: Diagnosis not present

## 2015-12-11 DIAGNOSIS — N183 Chronic kidney disease, stage 3 (moderate): Secondary | ICD-10-CM | POA: Diagnosis not present

## 2015-12-11 DIAGNOSIS — Z955 Presence of coronary angioplasty implant and graft: Secondary | ICD-10-CM | POA: Diagnosis not present

## 2015-12-11 DIAGNOSIS — Z885 Allergy status to narcotic agent status: Secondary | ICD-10-CM | POA: Diagnosis not present

## 2015-12-11 DIAGNOSIS — J45909 Unspecified asthma, uncomplicated: Secondary | ICD-10-CM | POA: Diagnosis not present

## 2015-12-11 DIAGNOSIS — I130 Hypertensive heart and chronic kidney disease with heart failure and stage 1 through stage 4 chronic kidney disease, or unspecified chronic kidney disease: Secondary | ICD-10-CM | POA: Diagnosis not present

## 2015-12-11 DIAGNOSIS — Z974 Presence of external hearing-aid: Secondary | ICD-10-CM | POA: Diagnosis not present

## 2015-12-11 DIAGNOSIS — I5023 Acute on chronic systolic (congestive) heart failure: Secondary | ICD-10-CM | POA: Diagnosis not present

## 2015-12-11 DIAGNOSIS — Z7901 Long term (current) use of anticoagulants: Secondary | ICD-10-CM | POA: Diagnosis not present

## 2015-12-11 DIAGNOSIS — Z7951 Long term (current) use of inhaled steroids: Secondary | ICD-10-CM | POA: Diagnosis not present

## 2015-12-11 DIAGNOSIS — I2510 Atherosclerotic heart disease of native coronary artery without angina pectoris: Secondary | ICD-10-CM | POA: Diagnosis not present

## 2015-12-11 DIAGNOSIS — Z96653 Presence of artificial knee joint, bilateral: Secondary | ICD-10-CM | POA: Diagnosis not present

## 2015-12-11 DIAGNOSIS — G4730 Sleep apnea, unspecified: Secondary | ICD-10-CM | POA: Diagnosis not present

## 2015-12-11 DIAGNOSIS — Z882 Allergy status to sulfonamides status: Secondary | ICD-10-CM | POA: Diagnosis not present

## 2015-12-11 DIAGNOSIS — N179 Acute kidney failure, unspecified: Secondary | ICD-10-CM | POA: Diagnosis not present

## 2015-12-11 DIAGNOSIS — H9190 Unspecified hearing loss, unspecified ear: Secondary | ICD-10-CM | POA: Diagnosis not present

## 2015-12-11 DIAGNOSIS — Z79899 Other long term (current) drug therapy: Secondary | ICD-10-CM | POA: Diagnosis not present

## 2015-12-11 DIAGNOSIS — E1122 Type 2 diabetes mellitus with diabetic chronic kidney disease: Secondary | ICD-10-CM | POA: Diagnosis not present

## 2015-12-11 MED FILL — glipiZIDE 5 MG TABS: 5 | 30 days supply | Qty: 60 | Fill #0

## 2015-12-11 MED FILL — metOLazone 2.5 MG TABS: 2.5 | 28 days supply | Qty: 8 | Fill #0

## 2015-12-11 MED FILL — CARVEDILOL 3.125 MG TABLET: 3.125 | 30 days supply | Qty: 60 | Fill #0

## 2015-12-11 MED FILL — FUROSEMIDE 20 MG TABLET: 20 | 20 days supply | Qty: 60 | Fill #0

## 2015-12-11 NOTE — Telephone Encounter (Signed)
Received paperwork for Medical Social Service; forwarded to provider/SLS 03/24

## 2015-12-11 NOTE — Telephone Encounter (Signed)
Received Home Health Certification and Plan of Care; forwarded to provider/SLS 03/24  

## 2015-12-13 DIAGNOSIS — Z7984 Long term (current) use of oral hypoglycemic drugs: Secondary | ICD-10-CM | POA: Diagnosis not present

## 2015-12-13 DIAGNOSIS — J449 Chronic obstructive pulmonary disease, unspecified: Secondary | ICD-10-CM | POA: Diagnosis not present

## 2015-12-13 DIAGNOSIS — Z7982 Long term (current) use of aspirin: Secondary | ICD-10-CM | POA: Diagnosis not present

## 2015-12-13 DIAGNOSIS — N183 Chronic kidney disease, stage 3 (moderate): Secondary | ICD-10-CM | POA: Diagnosis not present

## 2015-12-13 DIAGNOSIS — I5022 Chronic systolic (congestive) heart failure: Secondary | ICD-10-CM | POA: Diagnosis not present

## 2015-12-13 DIAGNOSIS — G4733 Obstructive sleep apnea (adult) (pediatric): Secondary | ICD-10-CM | POA: Diagnosis not present

## 2015-12-13 DIAGNOSIS — E1140 Type 2 diabetes mellitus with diabetic neuropathy, unspecified: Secondary | ICD-10-CM | POA: Diagnosis not present

## 2015-12-13 DIAGNOSIS — E785 Hyperlipidemia, unspecified: Secondary | ICD-10-CM | POA: Diagnosis not present

## 2015-12-13 DIAGNOSIS — M6281 Muscle weakness (generalized): Secondary | ICD-10-CM | POA: Diagnosis not present

## 2015-12-13 DIAGNOSIS — R262 Difficulty in walking, not elsewhere classified: Secondary | ICD-10-CM | POA: Diagnosis not present

## 2015-12-13 DIAGNOSIS — K219 Gastro-esophageal reflux disease without esophagitis: Secondary | ICD-10-CM | POA: Diagnosis not present

## 2015-12-13 DIAGNOSIS — I480 Paroxysmal atrial fibrillation: Secondary | ICD-10-CM | POA: Diagnosis not present

## 2015-12-14 ENCOUNTER — Telehealth: Payer: Self-pay | Admitting: *Deleted

## 2015-12-14 ENCOUNTER — Telehealth: Payer: Self-pay | Admitting: Medical

## 2015-12-14 DIAGNOSIS — G4733 Obstructive sleep apnea (adult) (pediatric): Secondary | ICD-10-CM | POA: Diagnosis not present

## 2015-12-14 DIAGNOSIS — M6281 Muscle weakness (generalized): Secondary | ICD-10-CM | POA: Diagnosis not present

## 2015-12-14 DIAGNOSIS — Z7984 Long term (current) use of oral hypoglycemic drugs: Secondary | ICD-10-CM | POA: Diagnosis not present

## 2015-12-14 DIAGNOSIS — N183 Chronic kidney disease, stage 3 (moderate): Secondary | ICD-10-CM | POA: Diagnosis not present

## 2015-12-14 DIAGNOSIS — I480 Paroxysmal atrial fibrillation: Secondary | ICD-10-CM | POA: Diagnosis not present

## 2015-12-14 DIAGNOSIS — R262 Difficulty in walking, not elsewhere classified: Secondary | ICD-10-CM | POA: Diagnosis not present

## 2015-12-14 DIAGNOSIS — E785 Hyperlipidemia, unspecified: Secondary | ICD-10-CM | POA: Diagnosis not present

## 2015-12-14 DIAGNOSIS — E1140 Type 2 diabetes mellitus with diabetic neuropathy, unspecified: Secondary | ICD-10-CM | POA: Diagnosis not present

## 2015-12-14 DIAGNOSIS — J449 Chronic obstructive pulmonary disease, unspecified: Secondary | ICD-10-CM | POA: Diagnosis not present

## 2015-12-14 DIAGNOSIS — K219 Gastro-esophageal reflux disease without esophagitis: Secondary | ICD-10-CM | POA: Diagnosis not present

## 2015-12-14 DIAGNOSIS — I5022 Chronic systolic (congestive) heart failure: Secondary | ICD-10-CM | POA: Diagnosis not present

## 2015-12-14 DIAGNOSIS — Z7982 Long term (current) use of aspirin: Secondary | ICD-10-CM | POA: Diagnosis not present

## 2015-12-14 NOTE — Telephone Encounter (Signed)
Spoke with pt and he states that he is having some trouble breathing and that is why he was not able complete the therapy. Pt was advised to call 911 and  to go to the ER if symptoms get any worse until someone could be at home with him. Pt would like to go to an assisted living facility and he is willing to do what it takes to get him in as soon as possible. He voices understanding on all instructions.

## 2015-12-14 NOTE — Telephone Encounter (Signed)
Please advise 

## 2015-12-14 NOTE — Telephone Encounter (Signed)
Spoke with Anderson Malta and she states that he was seen in hospital recently and the hospital sent the patient back home instead of sending the patient to a skilled facility. Per jennifer with PT and they would like to see if we can send the patient to a rehab facility. Pt told PT today that he was not able to eat much food due to no one being home to assist the patient with meals and all he had near him was halo oranges. Per PT the patient will not have anyone to take care of him for the rest of the night. FYI

## 2015-12-14 NOTE — Telephone Encounter (Signed)
Call appropiate facility ask them what is going on. We filled out paperwork for social services to evaluate for treatment. They were going to try to get him into SNF.

## 2015-12-14 NOTE — Telephone Encounter (Signed)
Can you call pt daughter at home. Sound like he may need to go to ED. Today or tomorrow. I would recommend [facility]. They might admit him and then he might get in pt rehab once he is stabilized? I may send him their tomorrow if he come in. You described report by PT he was extremely fatigued.. If pt needs advise him to call 911. I have concerns since home health PT stated he was so weak he could not stand. And you mentioned no one will be with him tonight. And he is not eating much.

## 2015-12-14 NOTE — Telephone Encounter (Signed)
Caller name: Anderson Malta with [facility], PT Can be reached: [phone]   Reason for call: Pt is home alone most of the day. He is staying at granddaughters home but she works and has mulitple appts of her own. Pt is weak and exhausted from CHF exacerbation. Pt is not dressed. Pt has eaten an orange for lunch because he is unable to get to the kitchen to get something to eat. She is asking if SNF would be a good choice for pt. Pt previously at [facility] in [place].

## 2015-12-14 NOTE — Telephone Encounter (Signed)
Received fax for Physician Communication & Interim Order from [facility] for [place]; forwarded to provider/SLS 03/27

## 2015-12-15 ENCOUNTER — Encounter (HOSPITAL_COMMUNITY): Payer: Self-pay | Admitting: Emergency Medicine

## 2015-12-15 ENCOUNTER — Telehealth: Payer: Self-pay | Admitting: Medical

## 2015-12-15 ENCOUNTER — Observation Stay (HOSPITAL_COMMUNITY)
Admission: EM | Admit: 2015-12-15 | Discharge: 2015-12-19 | Disposition: A | Discharge status: Skilled Nursing Facility | Payer: Medicare Other | Attending: Family Medicine | Admitting: Family Medicine

## 2015-12-15 ENCOUNTER — Encounter: Payer: Self-pay | Admitting: Medical

## 2015-12-15 ENCOUNTER — Ambulatory Visit (INDEPENDENT_AMBULATORY_CARE_PROVIDER_SITE_OTHER): Payer: Medicare Other | Admitting: Medical

## 2015-12-15 ENCOUNTER — Emergency Department (HOSPITAL_COMMUNITY): Payer: Medicare Other

## 2015-12-15 VITALS — BP 114/68 | HR 78 | Temp 98.1°F | Ht 72.0 in | Wt 272.2 lb

## 2015-12-15 DIAGNOSIS — I5022 Chronic systolic (congestive) heart failure: Secondary | ICD-10-CM | POA: Insufficient documentation

## 2015-12-15 DIAGNOSIS — I5023 Acute on chronic systolic (congestive) heart failure: Secondary | ICD-10-CM | POA: Insufficient documentation

## 2015-12-15 DIAGNOSIS — N179 Acute kidney failure, unspecified: Secondary | ICD-10-CM | POA: Diagnosis not present

## 2015-12-15 DIAGNOSIS — I252 Old myocardial infarction: Secondary | ICD-10-CM | POA: Diagnosis not present

## 2015-12-15 DIAGNOSIS — I130 Hypertensive heart and chronic kidney disease with heart failure and stage 1 through stage 4 chronic kidney disease, or unspecified chronic kidney disease: Secondary | ICD-10-CM | POA: Diagnosis not present

## 2015-12-15 DIAGNOSIS — E1122 Type 2 diabetes mellitus with diabetic chronic kidney disease: Secondary | ICD-10-CM | POA: Insufficient documentation

## 2015-12-15 DIAGNOSIS — J9601 Acute respiratory failure with hypoxia: Secondary | ICD-10-CM | POA: Diagnosis present

## 2015-12-15 DIAGNOSIS — I429 Cardiomyopathy, unspecified: Secondary | ICD-10-CM | POA: Diagnosis not present

## 2015-12-15 DIAGNOSIS — I428 Other cardiomyopathies: Secondary | ICD-10-CM

## 2015-12-15 DIAGNOSIS — I4891 Unspecified atrial fibrillation: Secondary | ICD-10-CM | POA: Diagnosis not present

## 2015-12-15 DIAGNOSIS — E1140 Type 2 diabetes mellitus with diabetic neuropathy, unspecified: Secondary | ICD-10-CM | POA: Insufficient documentation

## 2015-12-15 DIAGNOSIS — Z7901 Long term (current) use of anticoagulants: Secondary | ICD-10-CM | POA: Diagnosis not present

## 2015-12-15 DIAGNOSIS — E876 Hypokalemia: Secondary | ICD-10-CM | POA: Insufficient documentation

## 2015-12-15 DIAGNOSIS — E785 Hyperlipidemia, unspecified: Secondary | ICD-10-CM | POA: Diagnosis not present

## 2015-12-15 DIAGNOSIS — K219 Gastro-esophageal reflux disease without esophagitis: Secondary | ICD-10-CM | POA: Insufficient documentation

## 2015-12-15 DIAGNOSIS — J441 Chronic obstructive pulmonary disease with (acute) exacerbation: Principal | ICD-10-CM | POA: Insufficient documentation

## 2015-12-15 DIAGNOSIS — N183 Chronic kidney disease, stage 3 (moderate): Secondary | ICD-10-CM | POA: Insufficient documentation

## 2015-12-15 DIAGNOSIS — R0600 Dyspnea, unspecified: Secondary | ICD-10-CM

## 2015-12-15 DIAGNOSIS — I480 Paroxysmal atrial fibrillation: Secondary | ICD-10-CM | POA: Diagnosis not present

## 2015-12-15 DIAGNOSIS — I2510 Atherosclerotic heart disease of native coronary artery without angina pectoris: Secondary | ICD-10-CM | POA: Diagnosis not present

## 2015-12-15 DIAGNOSIS — Z955 Presence of coronary angioplasty implant and graft: Secondary | ICD-10-CM | POA: Insufficient documentation

## 2015-12-15 DIAGNOSIS — J9621 Acute and chronic respiratory failure with hypoxia: Secondary | ICD-10-CM | POA: Diagnosis not present

## 2015-12-15 DIAGNOSIS — R0602 Shortness of breath: Secondary | ICD-10-CM | POA: Diagnosis not present

## 2015-12-15 DIAGNOSIS — G4733 Obstructive sleep apnea (adult) (pediatric): Secondary | ICD-10-CM | POA: Diagnosis not present

## 2015-12-15 DIAGNOSIS — R0902 Hypoxemia: Secondary | ICD-10-CM | POA: Diagnosis not present

## 2015-12-15 DIAGNOSIS — J9600 Acute respiratory failure, unspecified whether with hypoxia or hypercapnia: Secondary | ICD-10-CM | POA: Diagnosis present

## 2015-12-15 DIAGNOSIS — R5383 Other fatigue: Secondary | ICD-10-CM

## 2015-12-15 DIAGNOSIS — E119 Type 2 diabetes mellitus without complications: Secondary | ICD-10-CM

## 2015-12-15 DIAGNOSIS — R531 Weakness: Secondary | ICD-10-CM

## 2015-12-15 DIAGNOSIS — Z6835 Body mass index (BMI) 35.0-35.9, adult: Secondary | ICD-10-CM | POA: Insufficient documentation

## 2015-12-15 DIAGNOSIS — N189 Chronic kidney disease, unspecified: Secondary | ICD-10-CM

## 2015-12-15 LAB — CBC
HCT: 40.3 % (ref 39.0–52.0)
HEMOGLOBIN: 12.8 g/dL — AB (ref 13.0–17.0)
MCH: 28 pg (ref 26.0–34.0)
MCHC: 31.8 g/dL (ref 30.0–36.0)
MCV: 88.2 fL (ref 78.0–100.0)
Platelets: 219 10*3/uL (ref 150–400)
RBC: 4.57 MIL/uL (ref 4.22–5.81)
RDW: 15.7 % — AB (ref 11.5–15.5)
WBC: 7.6 10*3/uL (ref 4.0–10.5)

## 2015-12-15 LAB — BASIC METABOLIC PANEL
ANION GAP: 14 (ref 5–15)
BUN: 26 mg/dL — ABNORMAL HIGH (ref 6–20)
CHLORIDE: 101 mmol/L (ref 101–111)
CO2: 27 mmol/L (ref 22–32)
CREATININE: 1.64 mg/dL — AB (ref 0.61–1.24)
Calcium: 9.2 mg/dL (ref 8.9–10.3)
GFR calc non Af Amer: 37 mL/min — ABNORMAL LOW (ref >60)
GFR, EST AFRICAN AMERICAN: 43 mL/min — AB (ref >60)
Glucose, Bld: 95 mg/dL (ref 65–99)
POTASSIUM: 3.6 mmol/L (ref 3.5–5.1)
SODIUM: 142 mmol/L (ref 135–145)

## 2015-12-15 LAB — I-STAT TROPONIN, ED
TROPONIN I, POC: 0.03 ng/mL (ref 0.00–0.08)
TROPONIN I, POC: 0.03 ng/mL (ref 0.00–0.08)

## 2015-12-15 LAB — BRAIN NATRIURETIC PEPTIDE: B NATRIURETIC PEPTIDE 5: 481.6 pg/mL — AB (ref 0.0–100.0)

## 2015-12-15 MED ORDER — SODIUM CHLORIDE 0.9 % IV BOLUS (SEPSIS)
500.0000 mL | Freq: Once | INTRAVENOUS | Status: AC
  Administered 2015-12-15: 500 mL via INTRAVENOUS

## 2015-12-15 NOTE — Telephone Encounter (Signed)
Caller name:Jennifer Relationship to patient:[facility] Can be reached:[phone] Pharmacy:  Reason for call:Patient was in [facility] for exaserbation.   They referred him to [facility] for home physical therapy.  She would like a verbal for 2 times a week for 5 weeks

## 2015-12-15 NOTE — Patient Instructions (Signed)
Pt describes severe fatigue/exhaustion. He can barely stand in our office. He has chronic chf. Atrial fibrillation , type 2 diabetes, and chronic kidney disease. Home health health PT had concerns yesterday that  pt could not stand well yesterday. His grandaughter has to work. And it would likely be unsafe to be at home. Concern for chronic conditions have worsened. Though his weight not up. Maybe some dehydrated since food intake yesterday was low. Maybe would benefit in patient rehab after he improves?  Follow up with [place] after ED evaluation and possible hospitalization.

## 2015-12-15 NOTE — Telephone Encounter (Signed)
Spoke with Anderson Malta and she states that she will have to send over a form for pcp to sign for the physical therapy.

## 2015-12-15 NOTE — ED Provider Notes (Signed)
CSN: [id number]     Arrival date & time 12/15/15  1257 History   First MD Initiated Contact with Patient 12/15/15 2042     Chief Complaint  Patient presents with  . Shortness of Breath  . Congestive Heart Failure  . Weakness     (Consider location/radiation/quality/duration/timing/severity/associated sxs/prior Treatment) Patient is a 82 y.o. male presenting with shortness of breath, CHF, and weakness.  Shortness of Breath Severity:  Moderate Onset quality:  Sudden Duration:  1 week Timing:  Constant Progression:  Unchanged Chronicity:  New Relieved by:  Nothing Worsened by:  Nothing tried Ineffective treatments:  None tried Associated symptoms: no abdominal pain, no chest pain, no fever, no headaches, no rash and no vomiting   Congestive Heart Failure Associated symptoms include shortness of breath. Pertinent negatives include no chest pain, no abdominal pain and no headaches.  Weakness Associated symptoms include shortness of breath. Pertinent negatives include no chest pain, no abdominal pain and no headaches.   82 yo M with a chief complaint of weakness. This been going on for multiple months. Patient thinks this been going on since November. At that time he was admitted to the hospital for a heart attack. He has been through rehabilitation but does not feel like it's helped that much. Went to see his family doctor today for an evaluation was found to be thoroughly weak and having trouble getting up and moving around. He is told to come to the ED for evaluation for possible admission. They also scheduled for more home health PT for him should he be discharged home.  Past Medical History  Diagnosis Date  . Diabetes mellitus without complication ([place])   . CHF (congestive heart failure) ([place])   . Coronary artery disease   . COPD (chronic obstructive pulmonary disease) ([place])   . Asthma   . Hypertension   . HOH (hard of hearing)   . Sleep apnea   . Myocardial infarction ([place])    . Hyperlipidemia 05/27/2015  . GERD (gastroesophageal reflux disease) 05/27/2015  . AKI (acute kidney injury) ([place]) 08/18/2015   Past Surgical History  Procedure Laterality Date  . Coronary angioplasty with stent placement    . Knee surgery    . Knee surgery      Knee replacement x2  . Cardioversion [name] 08/28/2015    Procedure: CARDIOVERSION;  Surgeon: Jerline Pain, MD;  Location: [facility];  Service: Cardiovascular;  Laterality: [name];  . Cardiac catheterization [name] 10/02/2015    Procedure: Left Heart Cath and Coronary Angiography;  Surgeon: Leonie Man, MD;  Location: [facility];  Service: Cardiovascular;  Laterality: [name];   Family History  Problem Relation Age of Onset  . Cancer Mother   . Diabetes Father   . Heart attack Brother   . Heart attack Sister    Social History  Substance Use Topics  . Smoking status: Never Smoker   . Smokeless tobacco: Never Used  . Alcohol Use: No    Review of Systems  Constitutional: Negative for fever and chills.  HENT: Negative for congestion and facial swelling.   Eyes: Negative for discharge and visual disturbance.  Respiratory: Positive for shortness of breath.   Cardiovascular: Negative for chest pain and palpitations.  Gastrointestinal: Negative for vomiting, abdominal pain and diarrhea.  Musculoskeletal: Negative for myalgias and arthralgias.  Skin: Negative for color change and rash.  Neurological: Positive for weakness. Negative for tremors, syncope and headaches.  Psychiatric/Behavioral: Negative for confusion and dysphoric mood.  Allergies  Percocet and Sulfa antibiotics  Home Medications   Prior to Admission medications   Medication Sig Start Date End Date Taking? Authorizing Provider  albuterol (PROVENTIL HFA;VENTOLIN HFA) 108 (90 BASE) MCG/ACT inhaler Inhale 2 puffs into the lungs every 4 (four) hours as needed for wheezing or shortness of breath.    Yes Historical Provider, MD  allopurinol (ZYLOPRIM)  100 MG tablet Take 1 tablet by mouth two  times daily 10/20/15  Yes Mackie Pai, PA-C  carvedilol (COREG) 3.125 MG tablet Take 3.125 mg by mouth. 12/11/15 01/10/16 Yes Historical Provider, MD  DULERA 200-5 MCG/ACT AERO Use 2 puffs two times daily 11/02/15  Yes Edward [name], PA-C  ELIQUIS 5 MG TABS tablet Take 1 tablet (5 mg total) by mouth 2 (two) times daily. Patient taking differently: Take 2.5 mg by mouth 2 (two) times daily.  10/12/15  Yes Edward [name], PA-C  fluticasone [facility]) 50 MCG/ACT nasal spray Use 2 sprays in each  nostril daily 11/13/15  Yes Midge Minium, MD  furosemide (LASIX) 20 MG tablet Take 20 mg by mouth. Take 1 tablet in the evening.   Yes Historical Provider, MD  gabapentin (NEURONTIN) 600 MG tablet Take 1 tablet (600 mg total) by mouth 3 (three) times daily. 09/29/15  Yes Edward [name], PA-C  glipiZIDE (GLUCOTROL) 5 MG tablet Take 5 mg by mouth. 12/11/15 12/10/16 Yes Historical Provider, MD  metolazone (ZAROXOLYN) 2.5 MG tablet  12/11/15 12/10/16 Yes Historical Provider, MD  montelukast (SINGULAIR) 10 MG tablet Take 1 tablet by mouth at  bedtime 11/10/15  Yes Edward [name], PA-C  NON FORMULARY Take 15 each by mouth Nightly. CPAP at night   Yes Historical Provider, MD  omeprazole (PRILOSEC) 20 MG capsule Take 1 capsule by mouth  daily 10/20/15  Yes Mackie Pai, PA-C  potassium chloride (K-DUR,KLOR-CON) 10 MEQ tablet Take 1 tablet by mouth two  times daily 11/13/15  Yes Midge Minium, MD  simvastatin (ZOCOR) 20 MG tablet Take 1 tablet by mouth  daily 11/13/15  Yes Midge Minium, MD  tiotropium (SPIRIVA) 18 MCG inhalation capsule Place 1 capsule (18 mcg total) into inhaler and inhale daily. 09/22/15  Yes Edward [name], PA-C   BP 94/72 mmHg  Pulse 80  Temp(Src) 97.6 F (36.4 C) (Oral)  Resp 22  SpO2 95% Physical Exam  Constitutional: He is oriented to person, place, and time. He appears well-developed and well-nourished.  HENT:  Head: Normocephalic and  atraumatic.  Eyes: EOM are normal. Pupils are equal, round, and reactive to light.  Neck: Normal range of motion. Neck supple. No JVD present.  Cardiovascular: Normal rate and regular rhythm.  Exam reveals no gallop and no friction rub.   No murmur heard. Pulmonary/Chest: No respiratory distress. He has no wheezes.  Abdominal: He exhibits no distension. There is no rebound and no guarding.  Musculoskeletal: Normal range of motion. He exhibits edema (2+).  Neurological: He is alert and oriented to person, place, and time.  Skin: No rash noted. No pallor.  Psychiatric: He has a normal mood and affect. His behavior is normal.  Nursing note and vitals reviewed.   ED Course  Procedures (including critical care time) Labs Review Labs Reviewed  BASIC METABOLIC PANEL - Abnormal; Notable for the following:    BUN 26 (*)    Creatinine, Ser 1.64 (*)    GFR calc non Af Amer 37 (*)    GFR calc Af Amer 43 (*)    All other components within  normal limits  CBC - Abnormal; Notable for the following:    Hemoglobin 12.8 (*)    RDW 15.7 (*)    All other components within normal limits  BRAIN NATRIURETIC PEPTIDE - Abnormal; Notable for the following:    B Natriuretic Peptide 481.6 (*)    All other components within normal limits  URINALYSIS, ROUTINE W REFLEX MICROSCOPIC (NOT AT [facility])  Randolm Idol, ED  Randolm Idol, ED    Imaging Review Dg Chest 2 View  12/15/2015  CLINICAL DATA:  Four month history of shortness of Breath. EXAM: CHEST  2 VIEW COMPARISON:  Multiple prior CT scans and a chest CT from 08/25/2015. FINDINGS: The heart is borderline enlarged but stable. Mild tortuosity of the thoracic aorta. The pulmonary hila appear normal and stable. Significant chronic interstitial lung disease and pulmonary scarring. No acute overlying pulmonary process. No pleural effusion. The bony thorax is intact. IMPRESSION: Chronic interstitial lung disease and pulmonary scarring. Recommend pulmonary  consultation and high-resolution chest CT for further evaluation (non urgent). No acute overlying pulmonary findings. Electronically Signed   By: Marijo Sanes M.D.   On: 12/15/2015 13:48   I have personally reviewed and evaluated these images and lab results as part of my medical decision-making.   EKG Interpretation   Date/Time:  Tuesday December 15 2015 13:08:06 EDT Ventricular Rate:  89 PR Interval:    QRS Duration: 146 QT Interval:  404 QTC Calculation: 491 R Axis:   -44 Text Interpretation:  Atrial fibrillation Left axis deviation Left bundle  branch block Abnormal ECG Confirmed by Deshanda Molitor MD, Quillian Quince [id number]) on  12/15/2015 8:46:17 PM      MDM   Final diagnoses:  Weakness  Hypoxia    82 yo M with a chief complaint weakness. Is getting worse going on for at least 4 months. Patient was seen by family doctor today for concern for worsening. Their notes says that there is no worsening lower extremity edema or weight gain concerning for heart failure exacerbation. In fact they're concerned that he may be mildly dehydrated. His creatinine is elevated here from his baseline. We'll give him a small fluid bolus attempt to ambulate.  Patient is unable to ambulate more than a couple steps without becoming acutely short of breath. His oxygen saturation drops into the 80s every time he does it. Will admit.  The patients results and plan were reviewed and discussed.   Any x-rays performed were independently reviewed by myself.   Differential diagnosis were considered with the presenting HPI.  Medications  sodium chloride 0.9 % bolus 500 mL (500 mLs Intravenous New Bag/Given 12/15/15 2105)    Filed Vitals:   12/15/15 2105 12/15/15 2115 12/15/15 2145 12/15/15 2230  BP: 94/72 108/59 107/61 110/61  Pulse: 80 73 77 76  Temp:      TempSrc:      Resp: 22 26 17 19   SpO2: 95% 95% 92% 94%    Final diagnoses:  Weakness    Admission/ observation were discussed with the admitting physician,  patient and/or family and they are comfortable with the plan.    Deno Etienne, DO 12/15/15 2352

## 2015-12-15 NOTE — Progress Notes (Signed)
Subjective:    Patient ID: Christopher Holder, male    DOB: 10/23/1929, 82 y.o.   MRN: [id number]  HPI   Pt in for follow up. Pt states still having trouble with breathing and getting worn out real fast.   Pt weight this am at home with no clothes 264.8 this morning.Pt weight has been weighing himself for last week. Pt weight on 25th was 263.4. That was day after discharge form [facility]. Pt is at home and his daughter is working. Pt states no energy recently. He states his mostly short of breath recenlty. He has shortness of breath lying supine.  [facility] restarted PT.   I had referred him for social service evaluation. Pt states he was told he was not ill enough to need skilled nursing facility.   Yesterday just ate oranges.   Review of Systems  Constitutional: Negative for fever, chills and fatigue.  HENT: Negative for congestion, dental problem, ear pain, postnasal drip and sinus pressure.   Respiratory: Positive for shortness of breath. Negative for cough and chest tightness.   Cardiovascular: Negative for chest pain and palpitations.  Gastrointestinal: Negative for abdominal pain.  Musculoskeletal:       Lower ext- 1-2+ pedal edema.  Neurological: Negative for dizziness and headaches.  Hematological: Negative for adenopathy. Does not bruise/bleed easily.  Psychiatric/Behavioral: Negative for behavioral problems and confusion.   Past Medical History  Diagnosis Date  . Diabetes mellitus without complication ([place])   . CHF (congestive heart failure) ([place])   . Coronary artery disease   . COPD (chronic obstructive pulmonary disease) ([place])   . Asthma   . Hypertension   . HOH (hard of hearing)   . Sleep apnea   . Myocardial infarction ([place])   . Hyperlipidemia 05/27/2015  . GERD (gastroesophageal reflux disease) 05/27/2015  . AKI (acute kidney injury) ([place]) 08/18/2015    Social History   Social History  . Marital Status: Widowed    Spouse Name: [name]    . Number of Children: [name]  . Years of Education: [name]   Occupational History  . Not on file.   Social History Main Topics  . Smoking status: Never Smoker   . Smokeless tobacco: Never Used  . Alcohol Use: No  . Drug Use: No  . Sexual Activity: Not on file   Other Topics Concern  . Not on file   Social History Narrative    Past Surgical History  Procedure Laterality Date  . Coronary angioplasty with stent placement    . Knee surgery    . Knee surgery      Knee replacement x2  . Cardioversion [name] 08/28/2015    Procedure: CARDIOVERSION;  Surgeon: Jerline Pain, MD;  Location: [facility];  Service: Cardiovascular;  Laterality: [name];  . Cardiac catheterization [name] 10/02/2015    Procedure: Left Heart Cath and Coronary Angiography;  Surgeon: Leonie Man, MD;  Location: [facility];  Service: Cardiovascular;  Laterality: [name];    Family History  Problem Relation Age of Onset  . Cancer Mother   . Diabetes Father   . Heart attack Brother   . Heart attack Sister     Allergies  Allergen Reactions  . Percocet [Oxycodone-Acetaminophen] Diarrhea, Nausea And Vomiting and Nausea Only  . Sulfa Antibiotics Diarrhea and Nausea And Vomiting    Current Outpatient Prescriptions on File Prior to Visit  Medication Sig Dispense Refill  . albuterol (PROVENTIL HFA;VENTOLIN HFA) 108 (90 BASE)  MCG/ACT inhaler Inhale 2 puffs into the lungs every 4 (four) hours as needed for wheezing or shortness of breath.     . allopurinol (ZYLOPRIM) 100 MG tablet Take 1 tablet by mouth two  times daily 120 tablet 1  . DULERA 200-5 MCG/ACT AERO Use 2 puffs two times daily 26 g 1  . ELIQUIS 5 MG TABS tablet Take 1 tablet (5 mg total) by mouth 2 (two) times daily. (Patient taking differently: Take 2.5 mg by mouth 2 (two) times daily. ) 120 tablet 2  . fluticasone (FLONASE) 50 MCG/ACT nasal spray Use 2 sprays in each  nostril daily 32 g 1  . gabapentin (NEURONTIN) 600 MG tablet Take 1 tablet (600 mg  total) by mouth 3 (three) times daily. 30 tablet 0  . montelukast (SINGULAIR) 10 MG tablet Take 1 tablet by mouth at  bedtime 90 tablet 0  . NON FORMULARY Take 15 each by mouth Nightly. CPAP at night    . omeprazole (PRILOSEC) 20 MG capsule Take 1 capsule by mouth  daily 60 capsule 1  . potassium chloride (K-DUR,KLOR-CON) 10 MEQ tablet Take 1 tablet by mouth two  times daily 120 tablet 1  . simvastatin (ZOCOR) 20 MG tablet Take 1 tablet by mouth  daily 90 tablet 1  . tiotropium (SPIRIVA) 18 MCG inhalation capsule Place 1 capsule (18 mcg total) into inhaler and inhale daily. 30 capsule 12   No current facility-administered medications on file prior to visit.    BP 114/68 mmHg  Pulse 78  Temp(Src) 98.1 F (36.7 C) (Oral)  Ht 6' (1.829 m)  Wt 272 lb 3.2 oz (123.469 kg)  BMI 36.91 kg/m2  SpO2 96%       Objective:   Physical Exam  General Mental Status- Alert. General Appearance- looks exhausted and has difficulty even standing.   Skin General: Color- Normal Color. Moisture- Normal Moisture.  Neck Carotid Arteries- Normal color. Moisture- Normal Moisture. No carotid bruits. No JVD.  Chest and Lung Exam Auscultation: Breath Sounds:-shallow. Pt o2 sat varied between 92-96% in office. Pulse 111 on standing.   Cardiovascular Auscultation:Rythm- Regular. Murmurs & Other Heart Sounds:Auscultation of the heart reveals- No Murmurs.  Abdomen Inspection:-Inspeection Normal. Palpation/Percussion:Note:No mass. Palpation and Percussion of the abdomen reveal- Non Tender, Non Distended + BS, no rebound or guarding.  Neurologic Cranial Nerve exam:- CN III-XII intact(No nystagmus), symmetric smile. Drift Test:- No drift. Romberg Exam:- Negative.  Heal to Toe Gait exam:-Normal. Finger to Nose:- Normal/Intact Strength:- 5/5 equal and symmetric strength both upper and lower extremities.      Assessment & Plan:  Pt describes severe fatigue/exhaustion. He can barely stand in our  office. He has chronic chf. Atrial fibrillation , type 2 diabetes, and chronic kidney disease. Home health health PT had concerns yesterday that  pt could not stand well yesterday. His grandaughter has to work. And it would likely be unsafe to be at home. Concern for chronic conditions have worsened. Though his weight not up. Maybe some dehydrated since food intake yesterday was low. Maybe would benefit in patient rehab after he improves?  Follow up with [place] after ED evaluation and possible hospitalization.

## 2015-12-15 NOTE — ED Notes (Signed)
Pts granddaughter here with pt and she has to clock in before 4pm this day.  Requesting contact her if need be and when pt is discharged or admitted.  Name: Christopher Holder Number/Pager: [phone] Cell Phone Number: [phone]

## 2015-12-15 NOTE — ED Notes (Signed)
Pt roommate states "we went to the doctor this morning, he just got out of hospital with CHF, went to doc this morning for follow up. HR was dropping with walking, and oxygen was 94-96 and would drop down to 91 when he was walking. Doctor was concerned becaUSE he could barely stand in the office". Pt states "its hard to breath, very difficult". Pt saturations 93% on RA. AAOX4.

## 2015-12-15 NOTE — ED Notes (Signed)
MD at bedside. 

## 2015-12-15 NOTE — Progress Notes (Signed)
Pre visit review using our clinic review tool, if applicable. No additional management support is needed unless otherwise documented below in the visit note. 

## 2015-12-15 NOTE — ED Notes (Signed)
Ambulated pt while on pulse ox, only went a few steps until pt was out of breath and pts o2 sat was 88%, pt stopped walking to catch his break and o2% went up to 95%. Pt ambulated back to bed and o2% came up to 100%

## 2015-12-16 ENCOUNTER — Ambulatory Visit: Payer: Medicare Other | Admitting: Medical

## 2015-12-16 ENCOUNTER — Telehealth: Payer: Self-pay | Admitting: *Deleted

## 2015-12-16 ENCOUNTER — Encounter (HOSPITAL_COMMUNITY): Payer: Self-pay | Admitting: Internal Medicine

## 2015-12-16 ENCOUNTER — Observation Stay (HOSPITAL_COMMUNITY): Payer: Medicare Other

## 2015-12-16 DIAGNOSIS — R0602 Shortness of breath: Secondary | ICD-10-CM | POA: Diagnosis not present

## 2015-12-16 DIAGNOSIS — N179 Acute kidney failure, unspecified: Secondary | ICD-10-CM | POA: Diagnosis not present

## 2015-12-16 DIAGNOSIS — N189 Chronic kidney disease, unspecified: Secondary | ICD-10-CM | POA: Diagnosis not present

## 2015-12-16 DIAGNOSIS — J90 Pleural effusion, not elsewhere classified: Secondary | ICD-10-CM | POA: Diagnosis not present

## 2015-12-16 DIAGNOSIS — J9601 Acute respiratory failure with hypoxia: Secondary | ICD-10-CM

## 2015-12-16 LAB — BASIC METABOLIC PANEL
Anion gap: 10 (ref 5–15)
BUN: 24 mg/dL — AB (ref 6–20)
CHLORIDE: 104 mmol/L (ref 101–111)
CO2: 28 mmol/L (ref 22–32)
CREATININE: 1.48 mg/dL — AB (ref 0.61–1.24)
Calcium: 8.5 mg/dL — ABNORMAL LOW (ref 8.9–10.3)
GFR calc Af Amer: 49 mL/min — ABNORMAL LOW (ref >60)
GFR calc non Af Amer: 42 mL/min — ABNORMAL LOW (ref >60)
Glucose, Bld: 139 mg/dL — ABNORMAL HIGH (ref 65–99)
Potassium: 2.9 mmol/L — ABNORMAL LOW (ref 3.5–5.1)
SODIUM: 142 mmol/L (ref 135–145)

## 2015-12-16 LAB — CBC WITH DIFFERENTIAL/PLATELET
Basophils Absolute: 0 10*3/uL (ref 0.0–0.1)
Basophils Relative: 0 %
EOS ABS: 0.2 10*3/uL (ref 0.0–0.7)
EOS PCT: 2 %
HCT: 37.1 % — ABNORMAL LOW (ref 39.0–52.0)
Hemoglobin: 12.1 g/dL — ABNORMAL LOW (ref 13.0–17.0)
LYMPHS ABS: 1.7 10*3/uL (ref 0.7–4.0)
Lymphocytes Relative: 26 %
MCH: 28.6 pg (ref 26.0–34.0)
MCHC: 32.6 g/dL (ref 30.0–36.0)
MCV: 87.7 fL (ref 78.0–100.0)
MONO ABS: 0.5 10*3/uL (ref 0.1–1.0)
MONOS PCT: 7 %
Neutro Abs: 4.3 10*3/uL (ref 1.7–7.7)
Neutrophils Relative %: 65 %
PLATELETS: 179 10*3/uL (ref 150–400)
RBC: 4.23 MIL/uL (ref 4.22–5.81)
RDW: 15.8 % — ABNORMAL HIGH (ref 11.5–15.5)
WBC: 6.7 10*3/uL (ref 4.0–10.5)

## 2015-12-16 LAB — GLUCOSE, CAPILLARY
GLUCOSE-CAPILLARY: 114 mg/dL — AB (ref 65–99)
Glucose-Capillary: 115 mg/dL — ABNORMAL HIGH (ref 65–99)
Glucose-Capillary: 114 mg/dL — ABNORMAL HIGH (ref 65–99)

## 2015-12-16 LAB — URINALYSIS, ROUTINE W REFLEX MICROSCOPIC
BILIRUBIN URINE: NEGATIVE
GLUCOSE, UA: NEGATIVE mg/dL
HGB URINE DIPSTICK: NEGATIVE
KETONES UR: NEGATIVE mg/dL
Leukocytes, UA: NEGATIVE
Nitrite: NEGATIVE
PROTEIN: NEGATIVE mg/dL
Specific Gravity, Urine: 1.013 (ref 1.005–1.030)
pH: 6 (ref 5.0–8.0)

## 2015-12-16 LAB — TROPONIN I: Troponin I: 0.03 ng/mL (ref <0.031)

## 2015-12-16 MED ORDER — FUROSEMIDE 10 MG/ML IJ SOLN
20.0000 mg | Freq: Two times a day (BID) | INTRAMUSCULAR | Status: DC
  Administered 2015-12-16 – 2015-12-18 (×4): 20 mg via INTRAVENOUS
  Filled 2015-12-16 (×4): qty 2

## 2015-12-16 MED ORDER — TECHNETIUM TO 99M ALBUMIN AGGREGATED
4.0000 | Freq: Once | INTRAVENOUS | Status: AC | PRN
  Administered 2015-12-16: 4 via INTRAVENOUS

## 2015-12-16 MED ORDER — TIOTROPIUM BROMIDE MONOHYDRATE 18 MCG IN CAPS
18.0000 ug | ORAL_CAPSULE | Freq: Every day | RESPIRATORY_TRACT | Status: DC
  Administered 2015-12-17 – 2015-12-19 (×3): 18 ug via RESPIRATORY_TRACT
  Filled 2015-12-16: qty 5

## 2015-12-16 MED ORDER — ACETAMINOPHEN 650 MG RE SUPP: 650.0000 mg | Freq: Four times a day (QID) | RECTAL | Status: DC | PRN

## 2015-12-16 MED ORDER — MONTELUKAST SODIUM 10 MG PO TABS
10.0000 mg | ORAL_TABLET | Freq: Every day | ORAL | Status: DC
  Administered 2015-12-16 – 2015-12-19 (×4): 10 mg via ORAL
  Filled 2015-12-16 (×4): qty 1

## 2015-12-16 MED ORDER — ALLOPURINOL 100 MG PO TABS
100.0000 mg | ORAL_TABLET | Freq: Two times a day (BID) | ORAL | Status: DC
  Administered 2015-12-16 – 2015-12-19 (×8): 100 mg via ORAL
  Filled 2015-12-16 (×8): qty 1

## 2015-12-16 MED ORDER — GABAPENTIN 600 MG PO TABS
600.0000 mg | ORAL_TABLET | Freq: Three times a day (TID) | ORAL | Status: DC
  Administered 2015-12-16 (×2): 600 mg via ORAL
  Filled 2015-12-16 (×2): qty 1

## 2015-12-16 MED ORDER — GABAPENTIN 600 MG PO TABS
600.0000 mg | ORAL_TABLET | Freq: Two times a day (BID) | ORAL | Status: DC
  Administered 2015-12-16 – 2015-12-19 (×7): 600 mg via ORAL
  Filled 2015-12-16 (×7): qty 1

## 2015-12-16 MED ORDER — INSULIN ASPART 100 UNIT/ML ~~LOC~~ SOLN: 0.0000 [IU] | Freq: Three times a day (TID) | SUBCUTANEOUS | Status: DC

## 2015-12-16 MED ORDER — ONDANSETRON HCL 4 MG/2ML IJ SOLN: 4.0000 mg | Freq: Four times a day (QID) | INTRAMUSCULAR | Status: DC | PRN

## 2015-12-16 MED ORDER — TECHNETIUM TC 99M DIETHYLENETRIAME-PENTAACETIC ACID: 30.0000 | Freq: Once | INTRAVENOUS | Status: DC | PRN

## 2015-12-16 MED ORDER — SIMVASTATIN 20 MG PO TABS
20.0000 mg | ORAL_TABLET | Freq: Every day | ORAL | Status: DC
  Administered 2015-12-16 – 2015-12-19 (×4): 20 mg via ORAL
  Filled 2015-12-16 (×5): qty 1

## 2015-12-16 MED ORDER — GABAPENTIN 400 MG PO CAPS: 1200.0000 mg | ORAL_CAPSULE | Freq: Every day | ORAL | Status: DC

## 2015-12-16 MED ORDER — ALBUTEROL SULFATE (2.5 MG/3ML) 0.083% IN NEBU
3.0000 mL | INHALATION_SOLUTION | RESPIRATORY_TRACT | Status: DC | PRN
  Administered 2015-12-16: 3 mL via RESPIRATORY_TRACT
  Filled 2015-12-16: qty 3

## 2015-12-16 MED ORDER — CAPSAICIN 0.025 % EX CREA
TOPICAL_CREAM | Freq: Every day | CUTANEOUS | Status: DC
  Administered 2015-12-16 – 2015-12-18 (×3): via TOPICAL
  Filled 2015-12-16: qty 56.6

## 2015-12-16 MED ORDER — APIXABAN 2.5 MG PO TABS
2.5000 mg | ORAL_TABLET | Freq: Two times a day (BID) | ORAL | Status: DC
  Administered 2015-12-16 – 2015-12-19 (×8): 2.5 mg via ORAL
  Filled 2015-12-16 (×8): qty 1

## 2015-12-16 MED ORDER — GLIPIZIDE 5 MG PO TABS
5.0000 mg | ORAL_TABLET | Freq: Every day | ORAL | Status: DC
  Administered 2015-12-16: 5 mg via ORAL
  Filled 2015-12-16: qty 1

## 2015-12-16 MED ORDER — MOMETASONE FURO-FORMOTEROL FUM 200-5 MCG/ACT IN AERO
2.0000 | INHALATION_SPRAY | Freq: Two times a day (BID) | RESPIRATORY_TRACT | Status: DC
  Administered 2015-12-16 – 2015-12-19 (×6): 2 via RESPIRATORY_TRACT
  Filled 2015-12-16: qty 8.8

## 2015-12-16 MED ORDER — INSULIN ASPART 100 UNIT/ML ~~LOC~~ SOLN
0.0000 [IU] | Freq: Three times a day (TID) | SUBCUTANEOUS | Status: DC
  Administered 2015-12-17 – 2015-12-19 (×4): 1 [IU] via SUBCUTANEOUS

## 2015-12-16 MED ORDER — FLUTICASONE PROPIONATE 50 MCG/ACT NA SUSP
2.0000 | Freq: Every day | NASAL | Status: DC
  Administered 2015-12-16 – 2015-12-19 (×4): 2 via NASAL
  Filled 2015-12-16: qty 16

## 2015-12-16 MED ORDER — POTASSIUM CHLORIDE CRYS ER 20 MEQ PO TBCR
40.0000 meq | EXTENDED_RELEASE_TABLET | Freq: Two times a day (BID) | ORAL | Status: DC
  Administered 2015-12-16 – 2015-12-19 (×8): 40 meq via ORAL
  Filled 2015-12-16 (×8): qty 2

## 2015-12-16 MED ORDER — CARVEDILOL 3.125 MG PO TABS
3.1250 mg | ORAL_TABLET | Freq: Two times a day (BID) | ORAL | Status: DC
  Administered 2015-12-16 – 2015-12-19 (×8): 3.125 mg via ORAL
  Filled 2015-12-16 (×8): qty 1

## 2015-12-16 MED ORDER — ONDANSETRON HCL 4 MG PO TABS: 4.0000 mg | ORAL_TABLET | Freq: Four times a day (QID) | ORAL | Status: DC | PRN

## 2015-12-16 MED ORDER — PANTOPRAZOLE SODIUM 40 MG PO TBEC
40.0000 mg | DELAYED_RELEASE_TABLET | Freq: Every day | ORAL | Status: DC
  Administered 2015-12-16 – 2015-12-19 (×4): 40 mg via ORAL
  Filled 2015-12-16 (×4): qty 1

## 2015-12-16 MED ORDER — ACETAMINOPHEN 325 MG PO TABS
650.0000 mg | ORAL_TABLET | Freq: Four times a day (QID) | ORAL | Status: DC | PRN
  Administered 2015-12-17: 650 mg via ORAL
  Filled 2015-12-16: qty 2

## 2015-12-16 NOTE — Evaluation (Signed)
Physical Therapy Evaluation Patient Details Name: Christopher Holder MRN: [id number] DOB: 1929/11/27 Today's Date: 12/16/2015   History of Present Illness  82 yo M with a chief complaint of weakness and SOB/DOE. Pmh includes HTN, COPD, CAD, DM  Clinical Impression  Patient demonstrates deficits in functional mobility as indicated below. Will need continued skilled PT to address deficits and maximize function. Will see as indicated and progress as tolerated.  Ambulated on room air with saturations stable >93% throughout entirety of activity. 3 standing rest breaks. reports increased DOE. HR fluctuations significant during activity ranging from bradycardic 50s to tachycardia 129 at peak.    Follow Up Recommendations Home health PT;Supervision - Intermittent    Equipment Recommendations  None recommended by PT    Recommendations for Other Services       Precautions / Restrictions Precautions Precautions:  (watch HR and DOE)      Mobility  Bed Mobility               General bed mobility comments: received in chair  Transfers Overall transfer level: Needs assistance Equipment used: Rolling walker (2 wheeled) Transfers: Sit to/from Stand Sit to Stand: Supervision         General transfer comment: No physical assist required, Vcs to not abandon RW when going to the chair, Vcs for hand placement and safety  Ambulation/Gait Ambulation/Gait assistance: Supervision Ambulation Distance (Feet): 160 Feet Assistive device: Rolling walker (2 wheeled) Gait Pattern/deviations: Step-through pattern;Trunk flexed Gait velocity: decreased Gait velocity interpretation: Below normal speed for age/gender General Gait Details: Vcs for positioning with use of RW. Cues for upright posture. Ambulated on room air with saturations stable >93% throughout entirety of activity. 3 standing rest breaks. reports increased DOE. HR fluctuations significant during activity ranging from bradycardic 50s to 129  at peak.  Stairs            Wheelchair Mobility    Modified Rankin (Stroke Patients Only)       Balance Overall balance assessment: Needs assistance   Sitting balance-Leahy Scale: Fair       Standing balance-Leahy Scale: Fair Standing balance comment: relied on wall to prop during standing rest breaks                             Pertinent Vitals/Pain Pain Assessment: No/denies pain    Home Living Family/patient expects to be discharged to:: Private residence Living Arrangements: Other relatives (grand-daughter) Available Help at Discharge: Family;Available PRN/intermittently (granddaughter not home a lot) Type of Home: Apartment Home Access: Stairs to enter Entrance Stairs-Rails: Right Entrance Stairs-Number of Steps: 5 Home Layout: One level Home Equipment: Walker - 2 wheels;Cane - single point      Prior Function Level of Independence: Independent with assistive device(s)         Comments: lives with his granddaughter but states that he has to be pretty independent because she is not there much. States that he makes his meals but sometimes just eats cereal or doesnt eat at all because he gets too SOB trying to do things     Hand Dominance   Dominant Hand: Right    Extremity/Trunk Assessment   Upper Extremity Assessment: Overall WFL for tasks assessed           Lower Extremity Assessment: Overall WFL for tasks assessed         Communication   Communication: HOH  Cognition Arousal/Alertness: Awake/alert Behavior During Therapy: [facility] for tasks  assessed/performed Overall Cognitive Status: Within Functional Limits for tasks assessed                      General Comments General comments (skin integrity, edema, etc.): educated on pursed lip breathing and energy conservation    Exercises        Assessment/Plan    PT Assessment Patient needs continued PT services  PT Diagnosis Difficulty walking   PT Problem List  Decreased activity tolerance;Decreased balance;Decreased mobility;Cardiopulmonary status limiting activity  PT Treatment Interventions DME instruction;Gait training;Stair training;Functional mobility training;Therapeutic activities;Therapeutic exercise;Balance training;Patient/family education   PT Goals (Current goals can be found in the Care Plan section) Acute Rehab PT Goals Patient Stated Goal: to go home PT Goal Formulation: With patient Time For Goal Achievement: 12/30/15 Potential to Achieve Goals: Good    Frequency Min 3X/week   Barriers to discharge Decreased caregiver support      Co-evaluation               End of Session Equipment Utilized During Treatment: Gait belt Activity Tolerance: Patient tolerated treatment well Patient left: in chair;with call bell/phone within reach Nurse Communication: Mobility status    Functional Assessment Tool Used: clinical judgement Functional Limitation: Mobility: Walking and moving around Mobility: Walking and Moving Around Current Status [phone]): At least 20 percent but less than 40 percent impaired, limited or restricted Mobility: Walking and Moving Around Goal Status [phone]): At least 1 percent but less than 20 percent impaired, limited or restricted    Time: 0945-1003 PT Time Calculation (min) (ACUTE ONLY): 18 min   Charges:   PT Evaluation $PT Eval Moderate Complexity: 1 Procedure     PT G Codes:   PT G-Codes **NOT FOR INPATIENT CLASS** Functional Assessment Tool Used: clinical judgement Functional Limitation: Mobility: Walking and moving around Mobility: Walking and Moving Around Current Status [id number]): At least 20 percent but less than 40 percent impaired, limited or restricted Mobility: Walking and Moving Around Goal Status [phone]): At least 1 percent but less than 20 percent impaired, limited or restricted    Duncan Dull 12/16/2015, 4:34 PM Alben Deeds, [place] DPT  [phone]

## 2015-12-16 NOTE — Procedures (Signed)
Pt declines the use of cpap this evening. Pt is resting comfortably on room air. RT will continue to monitor pt and will placed if requested or needed.

## 2015-12-16 NOTE — ED Notes (Signed)
Attempted report 

## 2015-12-16 NOTE — Telephone Encounter (Signed)
Received Medication Communication/Order; forwarded to provider/SLS 03/29

## 2015-12-16 NOTE — Care Management Obs Status (Signed)
[place] NOTIFICATION   Patient Details  Name: Christopher Holder MRN: [id number] Date of Birth: 09-Jul-1930   Medicare Observation Status Notification Given:  Yes  Patient doe not want to sign form at this time. [facility]  Royston Bake, RN 12/16/2015, 11:11 AM

## 2015-12-16 NOTE — H&P (Addendum)
[facility] History and Physical  Christopher Holder [id number] DOB: [birth year] DOA: 12/15/2015  Referring physician: ER physician. PCP: Mackie Pai, PA-C  Specialists: Dr. Marlou Porch. Cardiologist.  Chief Complaint: Shortness of breath.  HPI: Christopher Holder is a 82 y.o. male history of CAD status post stenting last cardiac cath in January 0000000, chronic systolic heart failure last year measured was 25-30% in December 2016, paroxysmal atrial fibrillation on Apixaban was referred to the ER after patient was found to have increasing the short of breath by his PCP. Unable to him that because of the shortness of breath. Patient states since his discharge on December after being treated for acute respiratory failure and septic shock from pneumonia patient's respiration is gradually worsened with exertion. Denies any productive cough fever chills chest pain. Chest x-ray was showing which is concerning for fibrosis. Labs revealed acute on chronic renal failure and ER physician had ordered 500 mL normal saline bolus. On my exam patient is not in distress. Patient has been admitted for further workup for exertional shortness of breath.  Review of Systems: As presented in the history of presenting illness, rest negative.  Past Medical History  Diagnosis Date  . Diabetes mellitus without complication ([place])   . CHF (congestive heart failure) ([place])   . Coronary artery disease   . COPD (chronic obstructive pulmonary disease) ([place])   . Asthma   . Hypertension   . HOH (hard of hearing)   . Sleep apnea   . Myocardial infarction ([place])   . Hyperlipidemia 05/27/2015  . GERD (gastroesophageal reflux disease) 05/27/2015  . AKI (acute kidney injury) ([place]) 08/18/2015   Past Surgical History  Procedure Laterality Date  . Coronary angioplasty with stent placement    . Knee surgery    . Knee surgery      Knee replacement x2  . Cardioversion [name] 08/28/2015    Procedure: CARDIOVERSION;  Surgeon: Jerline Pain, MD;   Location: [facility];  Service: Cardiovascular;  Laterality: [name];  . Cardiac catheterization [name] 10/02/2015    Procedure: Left Heart Cath and Coronary Angiography;  Surgeon: Leonie Man, MD;  Location: [facility];  Service: Cardiovascular;  Laterality: [name];   Social History:  reports that he has never smoked. He has never used smokeless tobacco. He reports that he does not drink alcohol or use illicit drugs. Where does patient live Home. Can patient participate in ADLs? Yes.  Allergies  Allergen Reactions  . Percocet [Oxycodone-Acetaminophen] Diarrhea, Nausea And Vomiting and Nausea Only  . Sulfa Antibiotics Diarrhea and Nausea And Vomiting    Family History:  Family History  Problem Relation Age of Onset  . Cancer Mother   . Diabetes Father   . Heart attack Brother   . Heart attack Sister       Prior to Admission medications   Medication Sig Start Date End Date Taking? Authorizing Provider  albuterol (PROVENTIL HFA;VENTOLIN HFA) 108 (90 BASE) MCG/ACT inhaler Inhale 2 puffs into the lungs every 4 (four) hours as needed for wheezing or shortness of breath.    Yes Historical Provider, MD  allopurinol (ZYLOPRIM) 100 MG tablet Take 1 tablet by mouth two  times daily 10/20/15  Yes Mackie Pai, PA-C  carvedilol (COREG) 3.125 MG tablet Take 3.125 mg by mouth. 12/11/15 01/10/16 Yes Historical Provider, MD  DULERA 200-5 MCG/ACT AERO Use 2 puffs two times daily 11/02/15  Yes Edward [name], PA-C  ELIQUIS 5 MG TABS tablet Take 1 tablet (5 mg total) by mouth 2 (  two) times daily. Patient taking differently: Take 2.5 mg by mouth 2 (two) times daily.  10/12/15  Yes Edward [name], PA-C  fluticasone [facility]) 50 MCG/ACT nasal spray Use 2 sprays in each  nostril daily 11/13/15  Yes Midge Minium, MD  furosemide (LASIX) 20 MG tablet Take 20 mg by mouth. Take 1 tablet in the evening.   Yes Historical Provider, MD  gabapentin (NEURONTIN) 600 MG tablet Take 1 tablet (600 mg total) by  mouth 3 (three) times daily. 09/29/15  Yes Edward [name], PA-C  glipiZIDE (GLUCOTROL) 5 MG tablet Take 5 mg by mouth. 12/11/15 12/10/16 Yes Historical Provider, MD  metolazone (ZAROXOLYN) 2.5 MG tablet  12/11/15 12/10/16 Yes Historical Provider, MD  montelukast (SINGULAIR) 10 MG tablet Take 1 tablet by mouth at  bedtime 11/10/15  Yes Edward [name], PA-C  NON FORMULARY Take 15 each by mouth Nightly. CPAP at night   Yes Historical Provider, MD  omeprazole (PRILOSEC) 20 MG capsule Take 1 capsule by mouth  daily 10/20/15  Yes Mackie Pai, PA-C  potassium chloride (K-DUR,KLOR-CON) 10 MEQ tablet Take 1 tablet by mouth two  times daily 11/13/15  Yes Midge Minium, MD  simvastatin (ZOCOR) 20 MG tablet Take 1 tablet by mouth  daily 11/13/15  Yes Midge Minium, MD  tiotropium (SPIRIVA) 18 MCG inhalation capsule Place 1 capsule (18 mcg total) into inhaler and inhale daily. 09/22/15  Yes Mackie Pai, PA-C    Physical Exam: Filed Vitals:   12/15/15 2330 12/16/15 0030 12/16/15 0045 12/16/15 0142  BP:  110/62 106/62 129/78  Pulse: 43 82 73 91  Temp:    97.5 F (36.4 C)  TempSrc:    Oral  Resp: 17 17 20 20   Height:    6' (1.829 m)  Weight:    264 lb 6.4 oz (119.931 kg)  SpO2: 93% 96% 98% 97%     General:  Moderately built and nourished.  Eyes: Anicteric. No pallor.  ENT: No discharge from the ears eyes nose and mouth.  Neck: No JVD appreciated. No mass felt.  Cardiovascular: S1-S2 regular.  Respiratory: No rhonchi or crepitations.  Abdomen: Soft nontender bowel sounds present.  Skin: No rash.  Musculoskeletal: Mild edema.  Psychiatric: Appears normal.  Neurologic: Alert awake oriented to time place and person. Moves all extremities.  Labs on Admission:  Basic Metabolic Panel:  Recent Labs Lab 12/15/15 1310  NA 142  K 3.6  CL 101  CO2 27  GLUCOSE 95  BUN 26*  CREATININE 1.64*  CALCIUM 9.2   Liver Function Tests: No results for input(s): AST, ALT, ALKPHOS,  BILITOT, PROT, ALBUMIN in the last 168 hours. No results for input(s): LIPASE, AMYLASE in the last 168 hours. No results for input(s): AMMONIA in the last 168 hours. CBC:  Recent Labs Lab 12/15/15 1310  WBC 7.6  HGB 12.8*  HCT 40.3  MCV 88.2  PLT 219   Cardiac Enzymes: No results for input(s): CKTOTAL, CKMB, CKMBINDEX, TROPONINI in the last 168 hours.  BNP (last 3 results)  Recent Labs  08/18/15 1718 08/26/15 0510 12/15/15 2100  BNP 830.0* 1054.2* 481.6*    ProBNP (last 3 results)  Recent Labs  10/06/15 1153 10/21/15 1106  PROBNP 474.0* 318.0*    CBG: No results for input(s): GLUCAP in the last 168 hours.  Radiological Exams on Admission: Dg Chest 2 View  12/15/2015  CLINICAL DATA:  Four month history of shortness of Breath. EXAM: CHEST  2 VIEW COMPARISON:  Multiple prior  CT scans and a chest CT from 08/25/2015. FINDINGS: The heart is borderline enlarged but stable. Mild tortuosity of the thoracic aorta. The pulmonary hila appear normal and stable. Significant chronic interstitial lung disease and pulmonary scarring. No acute overlying pulmonary process. No pleural effusion. The bony thorax is intact. IMPRESSION: Chronic interstitial lung disease and pulmonary scarring. Recommend pulmonary consultation and high-resolution chest CT for further evaluation (non urgent). No acute overlying pulmonary findings. Electronically Signed   By: Marijo Sanes M.D.   On: 12/15/2015 13:48    EKG: Independently reviewed. A. fib rate controlled.  Assessment/Plan Principal Problem:   Acute respiratory failure with hypoxia (HCC) Active Problems:   Diabetes mellitus type 2, controlled ([place])   Coronary artery disease involving native coronary artery of native heart without angina pectoris   Obstructive sleep apnea   Acute respiratory failure (HCC)   NICM (nonischemic cardiomyopathy) (HCC)   Hypoxia   Renal failure (ARF), acute on chronic ([place])   1. Acute respiratory failure -  given the abnormal chest x-ray I have ordered a high-resolution CT chest to check for interstitial lung disease. We will also get a VQ scan. At this time patient's Lasix and metolazone are on hold which can be restarted immediately if there is no worsening of renal function. Closely follow intake and output metabolic panel and daily weights.Physical therapy consult. 2. Chronic systolic heart failure last EF measured was 25-30% in December 2016 - see #1 regarding diuretics. 3. Acute on chronic renal failure - patient did receive 500 mL normal saline bolus in the ER. We will hold off any further fluids given the history of chronic systolic heart failure. 4. CAD status post stenting last cardiac cath in January 2017 - denies any chest pain. Since patient has exertional shortness of breath check troponin. 5. Paroxysmal atrial fibrillation - chads 2 vasc score is more than 2. Continue Apixaban and rate limiting medications. 6. OS a on CPAP. 7. Diabetes mellitus type 2 on glipizide. I have also added sliding scale coverage. 8. History of asthma - continue inhalers.   DVT Prophylaxis - Apixaban.  Code Status: Full Code.  Family Communication: Discussed with patient.  Disposition Plan: Admit for observation.    Nettie Wyffels N. [facility] Pager [phone].  If 7PM-7AM, please contact night-coverage [email or web address] Password [facility] 12/16/2015, 3:48 AM

## 2015-12-16 NOTE — Progress Notes (Signed)
8:33 AM I agree with HPI/GPe and A/P  per Dr. Aleene Davidson   82 year old male. CAD S/P stent 09/2015, CAD with PCI~2002? [facility]? Dr. Alejandro Mulling Heart Ischemic cardiomyopathy based on echo 08/2015 Diabetes mellitus type 2 complicated by diabetic neuropathy COPD versus asthma Sleep apnea on nocturnal CPAP Prior admissions for falls 07/2015 and incubated GERD   Paroxysmal A. fib on anticoagulation, [place] score ~6on anticoagulation-complicated by sinus bradycardia--cardioverted 08/28/15 by Dr. Marlou Porch Also followed by Dr. Jonathon Bellows evaluation by electrophysiology determined pacemaker was not required [10/09/15] With a QTC of 120-130 would not benefit from CRT  Admission 08/2015 ARF + septic shock + pneumonia. chronic lung disease followed by pulmonary   admitted to [facility] early morning 12/16/15 with multifactorial respiratory failure, systolic heart failure? Chronic lung disease  High-resolution CT scan  of the lungs as well as VQ scan were negative for any acute findings Tells me that since November he has had difficulty with breathing He was admitted as above  He has had some lower extremity swelling. He has not had any fever any chills any other findings suggestive of either pneumonia or COPD exacerbation and in addition is not wheezing    -We will start Lasix IV 20 mg twice a day. Would recommend that he follow closely with cardiology He will need close cardiology input regarding this  He tells me that his CPAP machine he has been  lengthening the time between changing his masks on his CPAP machine and this is not been serviced He will need home health to look in on this   hold glipizide given potential for hypoglycemia in hospital     Patient Active Problem List   Diagnosis Date Noted  . Renal failure (ARF), acute on chronic (HCC) 12/16/2015  . Hypoxia 12/15/2015  . Pain in lower limb 10/28/2015  . NICM (nonischemic cardiomyopathy) ([place]) 10/02/2015  . Abnormal nuclear  stress test 10/01/2015  . Acute respiratory failure ([place])   . Community acquired pneumonia   . Cough with hemoptysis   . Sepsis ([place])   . Chronic systolic congestive heart failure ([place])   . Pulmonary infiltrate   . OSA on CPAP   . Paroxysmal atrial fibrillation (HCC)   . Hemoptysis 08/29/2015  . Persistent atrial fibrillation ([place]) 08/26/2015  . Severe sepsis ([place])   . ARDS (adult respiratory distress syndrome) ([place])   . Renal failure, acute on chronic (HCC)   . Acute systolic CHF (congestive heart failure) ([place])   . Acute respiratory failure with hypoxemia ([place])   . Systolic CHF, acute on chronic (HCC)   . Prolonged QT interval   . Sepsis due to pneumonia ([place]) 08/18/2015  . AKI (acute kidney injury) ([place]) 08/18/2015  . Metabolic encephalopathy [id number]  . Thrombocytopenia ([place]) 08/18/2015  . Acute respiratory failure with hypoxia ([place]) 08/18/2015  . Hard of hearing 08/18/2015  . Chronic sinus bradycardia 08/18/2015  . CAP (community acquired pneumonia) 08/18/2015  . Acute pulmonary edema ([place]) 08/18/2015  . Septic shock ([place]) 08/18/2015  . Chronic cough 08/07/2015  . Onychomycosis 07/28/2015  . Injury by nail 07/28/2015  . Coronary artery disease involving native coronary artery of native heart without angina pectoris 06/19/2015  . Obesity 06/19/2015  . Obstructive sleep apnea 06/19/2015  . Other emphysema ([place]) 06/19/2015  . Diabetes mellitus type 2, controlled ([place]) 05/27/2015  . HTN (hypertension) 05/27/2015  . Hyperlipidemia 05/27/2015  . GERD (gastroesophageal reflux disease) 05/27/2015  . Allergic rhinitis 05/27/2015  . Diabetic neuropathy ([place]) 05/27/2015  .  CHF (congestive heart failure) ([place]) 05/27/2015

## 2015-12-17 DIAGNOSIS — J9601 Acute respiratory failure with hypoxia: Secondary | ICD-10-CM | POA: Diagnosis not present

## 2015-12-17 LAB — BASIC METABOLIC PANEL
Anion gap: 11 (ref 5–15)
BUN: 20 mg/dL (ref 6–20)
CHLORIDE: 103 mmol/L (ref 101–111)
CO2: 29 mmol/L (ref 22–32)
Calcium: 8.8 mg/dL — ABNORMAL LOW (ref 8.9–10.3)
Creatinine, Ser: 1.53 mg/dL — ABNORMAL HIGH (ref 0.61–1.24)
GFR calc non Af Amer: 40 mL/min — ABNORMAL LOW (ref >60)
GFR, EST AFRICAN AMERICAN: 47 mL/min — AB (ref >60)
Glucose, Bld: 114 mg/dL — ABNORMAL HIGH (ref 65–99)
POTASSIUM: 3.7 mmol/L (ref 3.5–5.1)
SODIUM: 143 mmol/L (ref 135–145)

## 2015-12-17 LAB — GLUCOSE, CAPILLARY
GLUCOSE-CAPILLARY: 125 mg/dL — AB (ref 65–99)
GLUCOSE-CAPILLARY: 105 mg/dL — AB (ref 65–99)
GLUCOSE-CAPILLARY: 150 mg/dL — AB (ref 65–99)
Glucose-Capillary: 142 mg/dL — ABNORMAL HIGH (ref 65–99)
Glucose-Capillary: 136 mg/dL — ABNORMAL HIGH (ref 65–99)

## 2015-12-17 LAB — MAGNESIUM: MAGNESIUM: 2.1 mg/dL (ref 1.7–2.4)

## 2015-12-17 NOTE — Progress Notes (Signed)
Akeim Refuerzo [id number] DOB: [birth year] DOA: 12/15/2015 PCP: Mackie Pai, PA-C  Brief narrative:  82 year old male. CAD S/P stent 09/2015, CAD with PCI~2002? [facility]? Dr. Alejandro Mulling Heart Ischemic cardiomyopathy based on echo 08/2015 Diabetes mellitus type 2 complicated by diabetic neuropathy COPD versus asthma Sleep apnea on nocturnal CPAP Prior admissions for falls 07/2015 and incubated GERD   Paroxysmal A. fib on anticoagulation, [place] score ~6on anticoagulation-complicated by sinus bradycardia--cardioverted 08/28/15 by Dr. Marlou Porch Also followed by Dr. Jonathon Bellows evaluation by electrophysiology determined pacemaker was not required [10/09/15] With a QTC of 120-130 would not benefit from CRT  Admission 08/2015 ARF + septic shock + pneumonia. chronic lung disease followed by pulmonary   admitted to [facility] early morning 12/16/15 with multifactorial respiratory failure, systolic heart failure? Chronic lung disease  High-resolution CT scan  of the lungs as well as VQ scan were negative for any acute findings Tells me that since November he has had difficulty with breathing He was admitted as above  He has had some lower extremity swelling. He has not had any fever any chills any other findings suggestive of either pneumonia or COPD exacerbation and in addition is not wheezing   Consultants:    Procedures:    Antibiotics:  none   Subjective   Doing fair  No n/v/cp tol diet stilla  Little SOB with exertion Ambulated short distacne with therapy but better    Objective    Interim History:   Telemetry:    Objective: Filed Vitals:   12/17/15 0703 12/17/15 0924 12/17/15 1047 12/17/15 1100  BP: 112/70   106/71  Pulse: 96   94  Temp: 97.8 F (36.6 C)   97.8 F (36.6 C)  TempSrc: Oral   Oral  Resp: 20   18  Height:      Weight:  120 kg (264 lb 8.8 oz)    SpO2: 94%  96% 94%    Intake/Output Summary (Last 24 hours) at 12/17/15 1526 Last  data filed at 12/17/15 1414  Gross per 24 hour  Intake   1140 ml  Output   2275 ml  Net  -1135 ml    Exam:  General: eomi ncat Cardiovascular: s1 s2 no m/r/g Respiratory: clear no added soudn Abdomen: soft nt nd no rebound no gaurd Skin intact Neuro intact  Data Reviewed: Basic Metabolic Panel:  Recent Labs Lab 12/15/15 1310 12/16/15 0432 12/17/15 0310  NA 142 142 143  K 3.6 2.9* 3.7  CL 101 104 103  CO2 27 28 29   GLUCOSE 95 139* 114*  BUN 26* 24* 20  CREATININE 1.64* 1.48* 1.53*  CALCIUM 9.2 8.5* 8.8*  MG  --   --  2.1   Liver Function Tests: No results for input(s): AST, ALT, ALKPHOS, BILITOT, PROT, ALBUMIN in the last 168 hours. No results for input(s): LIPASE, AMYLASE in the last 168 hours. No results for input(s): AMMONIA in the last 168 hours. CBC:  Recent Labs Lab 12/15/15 1310 12/16/15 0432  WBC 7.6 6.7  NEUTROABS  --  4.3  HGB 12.8* 12.1*  HCT 40.3 37.1*  MCV 88.2 87.7  PLT 219 179   Cardiac Enzymes:  Recent Labs Lab 12/16/15 0432  TROPONINI 0.03   BNP: Invalid input(s): POCBNP CBG:  Recent Labs Lab 12/16/15 1154 12/16/15 1631 12/16/15 2249 12/17/15 0550 12/17/15 1104  GLUCAP 115* 114* 125* 105* 142*    No results found for this or any previous visit (from the past 240 hour(s)).  Studies:              All Imaging reviewed and is as per above notation   Scheduled Meds: . allopurinol  100 mg Oral BID  . apixaban  2.5 mg Oral BID  . capsaicin   Topical QHS  . carvedilol  3.125 mg Oral BID WC  . fluticasone  2 spray Each Nare Daily  . furosemide  20 mg Intravenous BID  . gabapentin  600 mg Oral BID  . insulin aspart  0-9 Units Subcutaneous TID WC  . mometasone-formoterol  2 puff Inhalation BID  . montelukast  10 mg Oral QHS  . pantoprazole  40 mg Oral Daily  . potassium chloride  40 mEq Oral BID  . simvastatin  20 mg Oral q1800  . tiotropium  18 mcg Inhalation Daily   Continuous Infusions:     Assessment/Plan:  Multifactorial dyspnea-Chest CT -post inflamm fibrosis, V/Q neg for PE  AECHF-diurese lasix double home dose-20 IV bid.  Currently -740 cc, NEED standing WEIGHT  AECOPD-continue dulera 2 pff bid, spiriva 18 mcg , Monte[place]ukast 10 qhs--add steroid burst prednisone 40 dail to see if make s a difference.  Cont scheudled nebs Albuterol q4 for now 1. CHr Afib, Chad=6-cont coreg 3.125, eliquis 2.5 bid 2. DM with dm neuropathy-Gabapentin dosing as per Phamracy-has CKD.  Cont ssi for now.  Hold off glipizide 5 daily-?other options for neurpathy 3. CKD stg 3-4-monitor carefully on diuretics-rpt bmet am 4. GErd-Pantoprazole 40 qd Morbid obesity, Body mass index is 35.87 kg/(m^2).    Improved.  Monitor on tele reasses in am D/c snf 24-48 hrs  Verneita Griffes, MD  [facility] Pager [phone] 12/17/2015, 3:26 PM    LOS: 2 days

## 2015-12-17 NOTE — Progress Notes (Signed)
Patient 80 ft in the hallway. Patient ambulated 35 ft before getting SOB. Oxygen saturations maintained between 95-97% on room air while ambulating. HR maintained in the high 90's.

## 2015-12-17 NOTE — Research (Signed)
[email or web address]  Informed Consent   Subject Name: Christopher Holder  Subject met inclusion and exclusion criteria.  The informed consent form, study requirements and expectations were reviewed with the subject and questions and concerns were addressed prior to the signing of the consent form.  The subject verbalized understanding of the trail requirements.  The subject agreed to participate in the [email or web address][facility]DC  trial and signed the informed consent.  The informed consent was obtained prior to performance of any protocol-specific procedures for the subject.  A copy of the signed informed consent was given to the subject and a copy was placed in the subject's medical record.  [name] 12/17/2015, [phone]

## 2015-12-17 NOTE — Progress Notes (Signed)
Physical Therapy Treatment Patient Details Name: Christopher Holder MRN: [id number] DOB: 12-29-1929 Today's Date: 12/17/2015    History of Present Illness 82 yo M with a chief complaint of weakness and SOB/DOE. Pmh includes HTN, COPD, CAD, DM    PT Comments    Pt unable to ambulate as far today with 2-3/4 DOE, but HR without fluctuations that were present last treatment.  Pt c/o gout pain in feet.  Lengthy discussion regarding d/c environment.  Pt does not have 24 hour A and reports he mostly eats cereal and has difficulty around house at times due to dyspnea on exertion, plus he has 5 steps to enter home. He is interested in going to SNF rehab and feel this is appropriate at this time.  Pt may eventually be a good candidate for ALF.  D/c rec changed to SNF.     Follow Up Recommendations  SNF     Equipment Recommendations  None recommended by PT    Recommendations for Other Services       Precautions / Restrictions Precautions Precautions: Other (comment) (watch HR and DOE)    Mobility  Bed Mobility               General bed mobility comments: received in chair  Transfers Overall transfer level: Needs assistance Equipment used: Rolling walker (2 wheeled) Transfers: Sit to/from Stand Sit to Stand: Supervision         General transfer comment: no physical A, but cues for technique  Ambulation/Gait Ambulation/Gait assistance: Min guard Ambulation Distance (Feet): 80 Feet Assistive device: Rolling walker (2 wheeled) Gait Pattern/deviations: Step-through pattern;Trunk flexed Gait velocity: decreased   General Gait Details: DOE 2-3/4, but HR more stable today 90-110 with gait. 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 Pt states grand-daughter not there much and he has difficulty with meals and eats mostly cereal or may not even eat. Pt reports he wants to go to rehab to get stronger and feel this is appropriate.      Pertinent Vitals/Pain Pain Assessment: 0-10 Pain Score: 6  Pain Location: feet Pain Descriptors / Indicators: Other (Comment) (gout) Pain Intervention(s): Limited activity within patient's tolerance;Monitored during session    Home Living                      Prior Function            PT Goals (current goals can now be found in the care plan section) Acute Rehab PT Goals Patient Stated Goal: To go to rehab then home. PT Goal Formulation: With patient Time For  Goal Achievement: 12/30/15 Potential to Achieve Goals: Good Progress towards PT goals: Progressing toward goals    Frequency  Min 3X/week    PT Plan Discharge plan needs to be updated    Co-evaluation             End of Session Equipment Utilized During Treatment: Gait belt Activity Tolerance: Patient tolerated treatment well Patient left: in chair;with call bell/phone within reach     Time: 1125-1144 PT Time Calculation (min) (ACUTE ONLY): 19 min  Charges:  $Gait Training: 8-22 mins                    G Codes:      Ronee Ranganathan LUBECK 12/17/2015, 11:56 AM

## 2015-12-17 NOTE — NC FL2 (Signed)
[place] LEVEL OF CARE SCREENING TOOL     IDENTIFICATION  Patient Name: Christopher Holder Birthdate: [birth year] Sex: male Admission Date (Current Location): 12/15/2015  [facility] and [place] Number:  Herbalist and Address:  [facility], [place] [address] [place], [place] [address]      Provider Number: [id number]  Attending Physician Name and Address:  Nita Sells, MD  Relative Name and Phone Number:       Current Level of Care: Hospital Recommended Level of Care: [place] Prior Approval Number:    Date Approved/Denied:   PASRR Number: [id number]  Discharge Plan: SNF    Current Diagnoses: Patient Active Problem List   Diagnosis Date Noted  . Renal failure (ARF), acute on chronic (HCC) 12/16/2015  . Hypoxia 12/15/2015  . Pain in lower limb 10/28/2015  . NICM (nonischemic cardiomyopathy) ([place]) 10/02/2015  . Abnormal nuclear stress test 10/01/2015  . Acute respiratory failure ([place])   . Community acquired pneumonia   . Cough with hemoptysis   . Sepsis ([place])   . Chronic systolic congestive heart failure ([place])   . Pulmonary infiltrate   . OSA on CPAP   . Paroxysmal atrial fibrillation (HCC)   . Hemoptysis 08/29/2015  . Persistent atrial fibrillation ([place]) 08/26/2015  . Severe sepsis ([place])   . ARDS (adult respiratory distress syndrome) ([place])   . Renal failure, acute on chronic (HCC)   . Acute systolic CHF (congestive heart failure) ([place])   . Acute respiratory failure with hypoxemia ([place])   . Systolic CHF, acute on chronic (HCC)   . Prolonged QT interval   . Sepsis due to pneumonia ([place]) 08/18/2015  . AKI (acute kidney injury) ([place]) 08/18/2015  . Metabolic encephalopathy [id number]  . Thrombocytopenia ([place]) 08/18/2015  . Acute respiratory failure with hypoxia ([place]) 08/18/2015  . Hard of hearing 08/18/2015  . Chronic sinus bradycardia 08/18/2015  . CAP (community acquired pneumonia) 08/18/2015  .  Acute pulmonary edema ([place]) 08/18/2015  . Septic shock ([place]) 08/18/2015  . Chronic cough 08/07/2015  . Onychomycosis 07/28/2015  . Injury by nail 07/28/2015  . Coronary artery disease involving native coronary artery of native heart without angina pectoris 06/19/2015  . Obesity 06/19/2015  . Obstructive sleep apnea 06/19/2015  . Other emphysema ([place]) 06/19/2015  . Diabetes mellitus type 2, controlled ([place]) 05/27/2015  . HTN (hypertension) 05/27/2015  . Hyperlipidemia 05/27/2015  . GERD (gastroesophageal reflux disease) 05/27/2015  . Allergic rhinitis 05/27/2015  . Diabetic neuropathy ([place]) 05/27/2015  . CHF (congestive heart failure) ([place]) 05/27/2015    Orientation RESPIRATION BLADDER Height & Weight     Self, Time, Situation, Place  Normal Continent Weight: 264 lb 8.8 oz (120 kg) Height:  6' (182.9 cm)  BEHAVIORAL SYMPTOMS/MOOD NEUROLOGICAL BOWEL NUTRITION STATUS      Continent  (Heart healthy carb modified)  AMBULATORY STATUS COMMUNICATION OF NEEDS Skin   Limited Assist Verbally Normal                       Personal Care Assistance Level of Assistance  Bathing, Dressing, Feeding Bathing Assistance: Limited assistance Feeding assistance: Independent Dressing Assistance: Limited assistance     Functional Limitations Info             SPECIAL CARE FACTORS FREQUENCY  PT (By licensed PT), OT (By licensed OT)                    Contractures Contractures  Info: Not present    Additional Factors Info  Code Status, Allergies Code Status Info: FULL Allergies Info: Percocet, sulfa antibiotics           Current Medications (12/17/2015):  This is the current hospital active medication list Current Facility-Administered Medications  Medication Dose Route Frequency Provider Last Rate Last Dose  . acetaminophen (TYLENOL) tablet 650 mg  650 mg Oral Q6H PRN Rise Patience, MD   650 mg at 12/17/15 0234   Or  . acetaminophen (TYLENOL) suppository 650 mg  650  mg Rectal Q6H PRN Rise Patience, MD      . albuterol (PROVENTIL) (2.5 MG/3ML) 0.083% nebulizer solution 3 mL  3 mL Inhalation Q4H PRN Rise Patience, MD   3 mL at 12/16/15 1742  . allopurinol (ZYLOPRIM) tablet 100 mg  100 mg Oral BID Rise Patience, MD   100 mg at 12/17/15 1019  . apixaban (ELIQUIS) tablet 2.5 mg  2.5 mg Oral BID Rise Patience, MD   2.5 mg at 12/17/15 1019  . capsaicin (ZOSTRIX) 0.025 % cream   Topical QHS Nita Sells, MD      . carvedilol (COREG) tablet 3.125 mg  3.125 mg Oral BID WC Rise Patience, MD   3.125 mg at 12/17/15 0554  . fluticasone (FLONASE) 50 MCG/ACT nasal spray 2 spray  2 spray Each Nare Daily Rise Patience, MD   2 spray at 12/17/15 1019  . furosemide (LASIX) injection 20 mg  20 mg Intravenous BID Nita Sells, MD   20 mg at 12/17/15 0821  . gabapentin (NEURONTIN) tablet 600 mg  600 mg Oral BID Nita Sells, MD   600 mg at 12/17/15 1019  . insulin aspart (novoLOG) injection 0-9 Units  0-9 Units Subcutaneous TID WC Rise Patience, MD   1 Units at 12/17/15 1202  . mometasone-formoterol (DULERA) 200-5 MCG/ACT inhaler 2 puff  2 puff Inhalation BID Rise Patience, MD   2 puff at 12/17/15 1046  . montelukast (SINGULAIR) tablet 10 mg  10 mg Oral QHS Rise Patience, MD   10 mg at 12/16/15 2154  . ondansetron (ZOFRAN) tablet 4 mg  4 mg Oral Q6H PRN Rise Patience, MD       Or  . ondansetron [facility]) injection 4 mg  4 mg Intravenous Q6H PRN Rise Patience, MD      . pantoprazole (PROTONIX) EC tablet 40 mg  40 mg Oral Daily Rise Patience, MD   40 mg at 12/17/15 1019  . potassium chloride SA (K-DUR,KLOR-CON) CR tablet 40 mEq  40 mEq Oral BID Nita Sells, MD   40 mEq at 12/17/15 1019  . simvastatin (ZOCOR) tablet 20 mg  20 mg Oral q1800 Rise Patience, MD   20 mg at 12/16/15 1700  . technetium TC 37M diethylenetriame-pentaacetic acid (DTPA) injection 30 milli Curie  30 milli Curie  Intravenous Once PRN Nita Sells, MD      . tiotropium [facility]) inhalation capsule 18 mcg  18 mcg Inhalation Daily Rise Patience, MD   18 mcg at 12/17/15 1046     Discharge Medications: Please see discharge summary for a list of discharge medications.  Relevant Imaging Results:  Relevant Lab Results:   Additional Information SSN: [id number]  Dulcy Fanny, LCSW

## 2015-12-17 NOTE — Clinical Social Work Note (Signed)
Clinical Social Work Assessment  Patient Details  Name: Christopher Holder MRN: [id number] Date of Birth: 1929/12/23  Date of referral:  12/17/15               Reason for consult:  Facility Placement                Permission sought to share information with:  Facility Sport and exercise psychologist, Family Supports Permission granted to share information::  Yes, Verbal Permission Granted  Name::      Christopher Holder)  Agency::     Relationship::     Contact Information:     Housing/Transportation Living arrangements for the past 2 months:  Single Family Home Source of Information:  Power of Attorney Patient Interpreter Needed:  None Criminal Activity/Legal Involvement Pertinent to Current Situation/Hospitalization:  No - Comment as needed Significant Relationships:  Adult Children, Other Family Members (grandchild- Christopher Holder) Lives with:   (granddaughter) Do you feel safe going back to the place where you live?  Yes Need for family participation in patient care:  Yes (Comment) (patient currently alert though orientatiuon fluctuates)  Care giving concerns:  Granddaughter Christopher Holder is main caregiver for patient.  Christopher Holder states it is getting "harder and harder" to care for patient at home.   Social Worker assessment / plan:  Patient is alert though not fully oriented- orientation fluctuates.  CSW was notified by granddaughter at bedside that patient's Christopher Holder is daughter, Christopher Holder.  CSW left message for Christopher Holder and completed assessment with patient's granddaughter who reports being the main caregiver.  Of report, patient lives with granddaughter.  Christopher Holder states that it is becoming more difficult to care for patient and expressed concern regarding on-going care.  CSW provided support.  Granddaughter states that patient no longer drives and is no longer able to prepare meals alone.  Employment status:  Retired Nurse, adult PT Recommendations:  [place] / Referral to community resources:  [place]  Patient/Family's Response to care:  Daughter and Granddaughter are agreeable to SNF placement.  Patient/Family's Understanding of and Emotional Response to Diagnosis, Current Treatment, and Prognosis:  Family is realistic regarding on-going care needed for patient at time of discharge and was provided with information on applying for Medicaid for possible coverage for long term care.  Emotional Assessment Appearance:  Appears stated age Attitude/Demeanor/Rapport:    Affect (typically observed):  Accepting Orientation:  Oriented to Self Alcohol / Substance use:  Not Applicable Psych involvement (Current and /or in the community):  No (Comment)  Discharge Needs  Concerns to be addressed:  Care Coordination Readmission within the last 30 days:  No Current discharge risk:    Barriers to Discharge:  Continued Medical Work up   [employer], LCSW 12/17/2015, 1:54 PM

## 2015-12-17 NOTE — Clinical Social Work Placement (Signed)
   CLINICAL SOCIAL WORK PLACEMENT  NOTE  Date:  12/17/2015  Patient Details  Name: Christopher Holder MRN: [id number] Date of Birth: Oct 11, 1929  Clinical Social Work is seeking post-discharge placement for this patient at the [place] level of care (*CSW will initial, date and re-position this form in  chart as items are completed):      Patient/family provided with [place] Work Department's list of facilities offering this level of care within the geographic area requested by the patient (or if unable, by the patient's family).  Yes   Patient/family informed of their freedom to choose among providers that offer the needed level of care, that participate in Medicare, Medicaid or managed care program needed by the patient, have an available bed and are willing to accept the patient.  Yes   Patient/family informed of [place]'s ownership interest in [facility] and [facility], as well as of the fact that they are under no obligation to receive care at these facilities.  PASRR submitted to EDS on 12/17/15     PASRR number received on 12/17/15     Existing PASRR number confirmed on       FL2 transmitted to all facilities in geographic area requested by pt/family on 12/17/15     FL2 transmitted to all facilities within larger geographic area on       Patient informed that his/her managed care company has contracts with or will negotiate with certain facilities, including the following:            Patient/family informed of bed offers received.  Patient chooses bed at       Physician recommends and patient chooses bed at      Patient to be transferred to   on  .  Patient to be transferred to facility by       Patient family notified on   of transfer.  Name of family member notified:        PHYSICIAN       Additional Comment:    _______________________________________________ Dulcy Fanny, LCSW 12/17/2015, 1:58 PM

## 2015-12-18 DIAGNOSIS — I5023 Acute on chronic systolic (congestive) heart failure: Secondary | ICD-10-CM | POA: Diagnosis not present

## 2015-12-18 DIAGNOSIS — I5041 Acute combined systolic (congestive) and diastolic (congestive) heart failure: Secondary | ICD-10-CM | POA: Diagnosis not present

## 2015-12-18 DIAGNOSIS — J9601 Acute respiratory failure with hypoxia: Secondary | ICD-10-CM | POA: Diagnosis not present

## 2015-12-18 LAB — GLUCOSE, CAPILLARY
GLUCOSE-CAPILLARY: 109 mg/dL — AB (ref 65–99)
GLUCOSE-CAPILLARY: 121 mg/dL — AB (ref 65–99)
Glucose-Capillary: 129 mg/dL — ABNORMAL HIGH (ref 65–99)
Glucose-Capillary: 120 mg/dL — ABNORMAL HIGH (ref 65–99)

## 2015-12-18 LAB — CBC
HEMATOCRIT: 38.4 % — AB (ref 39.0–52.0)
HEMOGLOBIN: 12 g/dL — AB (ref 13.0–17.0)
MCH: 27.5 pg (ref 26.0–34.0)
MCHC: 31.3 g/dL (ref 30.0–36.0)
MCV: 87.9 fL (ref 78.0–100.0)
Platelets: 173 10*3/uL (ref 150–400)
RBC: 4.37 MIL/uL (ref 4.22–5.81)
RDW: 15.7 % — AB (ref 11.5–15.5)
WBC: 5.8 10*3/uL (ref 4.0–10.5)

## 2015-12-18 LAB — BASIC METABOLIC PANEL
ANION GAP: 11 (ref 5–15)
BUN: 20 mg/dL (ref 6–20)
CHLORIDE: 104 mmol/L (ref 101–111)
CO2: 27 mmol/L (ref 22–32)
CREATININE: 1.45 mg/dL — AB (ref 0.61–1.24)
Calcium: 8.8 mg/dL — ABNORMAL LOW (ref 8.9–10.3)
GFR calc non Af Amer: 43 mL/min — ABNORMAL LOW (ref >60)
GFR, EST AFRICAN AMERICAN: 50 mL/min — AB (ref >60)
Glucose, Bld: 127 mg/dL — ABNORMAL HIGH (ref 65–99)
POTASSIUM: 3.7 mmol/L (ref 3.5–5.1)
SODIUM: 142 mmol/L (ref 135–145)

## 2015-12-18 MED ORDER — FUROSEMIDE 20 MG PO TABS: 20.0000 mg | ORAL_TABLET | Freq: Two times a day (BID) | ORAL | Status: DC

## 2015-12-18 MED ORDER — PREDNISONE 5 MG PO TABS: 10.0000 mg | ORAL_TABLET | Freq: Every day | ORAL | Status: DC

## 2015-12-18 MED ORDER — FUROSEMIDE 10 MG/ML IJ SOLN
80.0000 mg | Freq: Once | INTRAMUSCULAR | Status: AC
  Administered 2015-12-18: 80 mg via INTRAVENOUS
  Filled 2015-12-18: qty 8

## 2015-12-18 MED ORDER — METOLAZONE 2.5 MG PO TABS
2.5000 mg | ORAL_TABLET | Freq: Once | ORAL | Status: AC
  Administered 2015-12-18: 2.5 mg via ORAL
  Filled 2015-12-18: qty 1

## 2015-12-18 MED ORDER — CAPSAICIN 0.025 % EX CREA: TOPICAL_CREAM | Freq: Every day | CUTANEOUS | Status: DC

## 2015-12-18 MED ORDER — CARVEDILOL 3.125 MG PO TABS: 3.1250 mg | ORAL_TABLET | Freq: Two times a day (BID) | ORAL | Status: DC

## 2015-12-18 MED ORDER — FUROSEMIDE 10 MG/ML IJ SOLN
60.0000 mg | Freq: Once | INTRAMUSCULAR | Status: AC
  Administered 2015-12-18: 60 mg via INTRAVENOUS
  Filled 2015-12-18: qty 6

## 2015-12-18 NOTE — Progress Notes (Signed)
Physical Therapy Treatment Patient Details Name: Christopher Holder MRN: [id number] DOB: 1930-05-30 Today's Date: 12/18/2015    History of Present Illness 82 yo M with a chief complaint of weakness and SOB/DOE. Pmh includes HTN, COPD, CAD, DM    PT Comments    Continues to fatigue easily with moderate dyspnea on exertion, SpO2 however 97% on room air, HR avg 110s with gait. Very motivated and pleasant, appreciative of PT. Reports having several falls at home prior to admission and due to limited assist at home would benefit from [facility]. Will continue to follow and progress until d/c.  Follow Up Recommendations  SNF     Equipment Recommendations  None recommended by PT    Recommendations for Other Services       Precautions / Restrictions Precautions Precautions: Other (comment) (watch HR and DOE) Restrictions Weight Bearing Restrictions: No    Mobility  Bed Mobility               General bed mobility comments: received in chair  Transfers Overall transfer level: Needs assistance Equipment used: Rolling walker (2 wheeled) Transfers: Sit to/from Stand Sit to Stand: Supervision         General transfer comment: Slow to rise. VC for hand placement. Effortful for patient from low recliner but completed without physical assist.  Ambulation/Gait Ambulation/Gait assistance: Min guard Ambulation Distance (Feet): 85 Feet (+75) Assistive device: Rolling walker (2 wheeled) Gait Pattern/deviations: Step-through pattern;Decreased stride length;Trunk flexed Gait velocity: decreased Gait velocity interpretation: Below normal speed for age/gender General Gait Details: Tolerated 2 bouts today, but required a standing rest break halfway through each distance. 3/4 dyspnea, SpO2 97% on room air. Symptoms improved with seated rest break. Cues for posture and education for energy conservation techniques and pursed lip breathing throughout.   Stairs            Wheelchair  Mobility    Modified Rankin (Stroke Patients Only)       Balance                                    Cognition Arousal/Alertness: Awake/alert Behavior During Therapy: WFL for tasks assessed/performed Overall Cognitive Status: Within Functional Limits for tasks assessed                      Exercises General Exercises - Lower Extremity Ankle Circles/Pumps: AROM;Both;10 reps;Seated    General Comments        Pertinent Vitals/Pain Pain Assessment: No/denies pain Pain Intervention(s): Monitored during session    Home Living                      Prior Function            PT Goals (current goals can now be found in the care plan section) Acute Rehab PT Goals Patient Stated Goal: To go to rehab then home. PT Goal Formulation: With patient Time For Goal Achievement: 12/30/15 Potential to Achieve Goals: Good Progress towards PT goals: Progressing toward goals    Frequency  Min 3X/week    PT Plan Current plan remains appropriate    Co-evaluation             End of Session Equipment Utilized During Treatment: Gait belt Activity Tolerance: Patient tolerated treatment well Patient left: in chair;with call bell/phone within reach     Time: 1733-1747 PT Time Calculation (min) (ACUTE  ONLY): 14 min  Charges:  $Gait Training: 8-22 mins                    G Codes:      Ellouise Newer 12/26/2015, 6:13 PM Camille Bal [place], [place]

## 2015-12-18 NOTE — Consult Note (Signed)
Advanced Heart Failure Team Consult Note  Referring Physician: Dr Verlon Au Primary Physician: Mackie Pai, PA-C Primary Cardiologist:  Dr Marlou Porch Primary EP: Dr Rayann Heman Primary HF: New  Reason for Consultation: ReDS vest Study, Systolic CHF  HPI:    Christopher Holder is a 82 y.o. male with history of CAD s/p D1 stent (patent on repeat cath 123XX123), chronic systolic CHF EF 123XX123, HTN, HLD, DM, CKD, OSA with CPAP, Paroxysmal Afib, and COPD.   He states he has a "large heart attack" in 2002 and was emergently stented at a hospital in [place]. Was told part of his heart was "dead" and would likely never work again.   Echo 08/21/15 LVEF 25-30%, Severe diffuse HK, Moderate MR, PA peak pressure 41 mm Hg  Nuclear Stress 09/25/15 - Indications, with elevated troponins with acute illness and low EF on echo. Showed EF 36%, High risk study with severe LVE suggestion of TID. Small inferior wall infarct from apex to base.   LHC 10/02/15 with normal coronaries and widely patent D1 stent, no culprit lesions for previous HIGH RISK stress test. Elevated LVEDP and severe LV systolic dysfunction.   At home patient has felt SOB since November, especially worse over the last 3 days.  Of note he was seen by Dr Rayann Heman in 09/2015 and was in NSR.  He was SOB with minimal exertion. + orthopnea. Gets dizziness and lightheadedness when he bends over, so avoids doing so.  He had not been weighing daily or watching his diet or fluid in take. He was compliant with his medications at home, and uses his CPAP every night. His granddaughter lives with him, but she does not manage his care.   He was sent to the ER 12/15/15 from his PCP office with worsening SOB.  Of note, he was treated for ARF and septic shock from [facility] in 08/2015. Has had gradually worsening DOE since that time.  He has had no productive cough, fever, chills, or CP. Pertinent labs on admission include Creatinine 1.6, K 3.6, BNP 481.6, Troponins flat.  CXR  showed fibrosis thought to be postinflammatory when compared with previous CXR. Also showed moderate bilateral pleural effusions, R>L and enlarged pulmonary arteries, indicative of PAH. NM perfusion and vent scan with very low probability of PE, had small areas of matched defect. EKG with Afib.   He is out 1.2 L and down 2 lbs thus far this admission.  His weight is 262 lbs, the lowest it has been in some time by our records.  HF team consulted per ReDS vest protocol with reading > 39 (pt reading 44% lung water).  He is currently SOB at rest that worsens with minimal exertion. He intermittently has to stop in the middle of a sentence. Feels like his heart is beating out of rhythm. He states his breathing is not any better since admission.   He has a h/o of PAF and is On Eliquis. He was in NSR in January when he saw Dr. Rayann Heman but now he is in AF. Unclear duration. Rate is controlled.    Review of Systems: [y] = yes, [ ]  = no   General: Weight gain [y]; Weight loss [ ] ; Anorexia [ ] ; Fatigue [ ] ; Fever [ ] ; Chills [ ] ; Weakness [ ]   Cardiac: Chest pain/pressure [ ] ; Resting SOB [y]; Exertional SOB [y]; Orthopnea [y]; Pedal Edema [ ] ; Palpitations [ ] ; Syncope [ ] ; Presyncope [ ] ; Paroxysmal nocturnal dyspnea[ ]   Pulmonary: Cough [ ] ;  Wheezing[ ] ; Hemoptysis[ ] ; Sputum [ ] ; Snoring [ ]   GI: Vomiting[ ] ; Dysphagia[ ] ; Melena[ ] ; Hematochezia [ ] ; Heartburn[ ] ; Abdominal pain [ ] ; Constipation [ ] ; Diarrhea [ ] ; BRBPR [ ]   GU: Hematuria[ ] ; Dysuria [ ] ; Nocturia[ ]   Vascular: Pain in legs with walking [ ] ; Pain in feet with lying flat [ ] ; Non-healing sores [ ] ; Stroke [ ] ; TIA [ ] ; Slurred speech [ ] ;  Neuro: Headaches[ ] ; Vertigo[ ] ; Seizures[ ] ; Paresthesias[ ] ;Blurred vision [ ] ; Diplopia [ ] ; Vision changes [ ]   Ortho/Skin: Arthritis [y]; Joint pain [y]; Muscle pain [ ] ; Joint swelling [ ] ; Back Pain [ ] ; Rash [ ]   Psych: Depression[ ] ; Anxiety[ ]   Heme: Bleeding problems [ ] ; Clotting disorders  [ ] ; Anemia [ ]   Endocrine: Diabetes [y]; Thyroid dysfunction[ ]   Home Medications Prior to Admission medications   Medication Sig Start Date End Date Taking? Authorizing Provider  albuterol (PROVENTIL HFA;VENTOLIN HFA) 108 (90 BASE) MCG/ACT inhaler Inhale 2 puffs into the lungs every 4 (four) hours as needed for wheezing or shortness of breath.    Yes Historical Provider, MD  allopurinol (ZYLOPRIM) 100 MG tablet Take 1 tablet by mouth two  times daily 10/20/15  Yes Mackie Pai, PA-C  DULERA 200-5 MCG/ACT AERO Use 2 puffs two times daily 11/02/15  Yes Edward [name], PA-C  ELIQUIS 5 MG TABS tablet Take 1 tablet (5 mg total) by mouth 2 (two) times daily. Patient taking differently: Take 2.5 mg by mouth 2 (two) times daily.  10/12/15  Yes Edward [name], PA-C  fluticasone [facility]) 50 MCG/ACT nasal spray Use 2 sprays in each  nostril daily 11/13/15  Yes Midge Minium, MD  gabapentin (NEURONTIN) 600 MG tablet Take 1 tablet (600 mg total) by mouth 3 (three) times daily. 09/29/15  Yes Edward [name], PA-C  glipiZIDE (GLUCOTROL) 5 MG tablet Take 5 mg by mouth. 12/11/15 12/10/16 Yes Historical Provider, MD  metolazone (ZAROXOLYN) 2.5 MG tablet  12/11/15 12/10/16 Yes Historical Provider, MD  montelukast (SINGULAIR) 10 MG tablet Take 1 tablet by mouth at  bedtime 11/10/15  Yes Edward [name], PA-C  NON FORMULARY Take 15 each by mouth Nightly. CPAP at night   Yes Historical Provider, MD  omeprazole (PRILOSEC) 20 MG capsule Take 1 capsule by mouth  daily 10/20/15  Yes Mackie Pai, PA-C  potassium chloride (K-DUR,KLOR-CON) 10 MEQ tablet Take 1 tablet by mouth two  times daily 11/13/15  Yes Midge Minium, MD  simvastatin (ZOCOR) 20 MG tablet Take 1 tablet by mouth  daily 11/13/15  Yes Midge Minium, MD  tiotropium (SPIRIVA) 18 MCG inhalation capsule Place 1 capsule (18 mcg total) into inhaler and inhale daily. 09/22/15  Yes Edward [name], PA-C  capsaicin (ZOSTRIX) 0.025 % cream Apply topically at  bedtime. 12/18/15   Nita Sells, MD  carvedilol (COREG) 3.125 MG tablet Take 1 tablet (3.125 mg total) by mouth 2 (two) times daily with a meal. 12/18/15 01/17/16  Nita Sells, MD  furosemide (LASIX) 20 MG tablet Take 1 tablet (20 mg total) by mouth 2 (two) times daily. Take 1 tablet in the evening. 12/18/15   Nita Sells, MD  predniSONE (DELTASONE) 5 MG tablet Take 2 tablets (10 mg total) by mouth daily with breakfast. 12/18/15   Nita Sells, MD    Past Medical History: Past Medical History  Diagnosis Date  . Diabetes mellitus without complication ([place])   . CHF (congestive heart failure) ([place])   .  Coronary artery disease   . COPD (chronic obstructive pulmonary disease) ([place])   . Asthma   . Hypertension   . HOH (hard of hearing)   . Sleep apnea   . Myocardial infarction ([place])   . Hyperlipidemia 05/27/2015  . GERD (gastroesophageal reflux disease) 05/27/2015  . AKI (acute kidney injury) ([place]) 08/18/2015    Past Surgical History: Past Surgical History  Procedure Laterality Date  . Coronary angioplasty with stent placement    . Knee surgery    . Knee surgery      Knee replacement x2  . Cardioversion [name] 08/28/2015    Procedure: CARDIOVERSION;  Surgeon: Jerline Pain, MD;  Location: [facility];  Service: Cardiovascular;  Laterality: [name];  . Cardiac catheterization [name] 10/02/2015    Procedure: Left Heart Cath and Coronary Angiography;  Surgeon: Leonie Man, MD;  Location: [facility];  Service: Cardiovascular;  Laterality: [name];    Family History: Family History  Problem Relation Age of Onset  . Cancer Mother   . Diabetes Father   . Heart attack Brother   . Heart attack Sister     Social History: Social History   Social History  . Marital Status: Widowed    Spouse Name: [name]  . Number of Children: [name]  . Years of Education: [name]   Social History Main Topics  . Smoking status: Never Smoker   . Smokeless tobacco: Never Used  . Alcohol  Use: No  . Drug Use: No  . Sexual Activity: Not Asked   Other Topics Concern  . None   Social History Narrative    Allergies:  Allergies  Allergen Reactions  . Percocet [Oxycodone-Acetaminophen] Diarrhea, Nausea And Vomiting and Nausea Only  . Sulfa Antibiotics Diarrhea and Nausea And Vomiting    Objective:    Vital Signs:   Temp:  [97.7 F (36.5 C)-98.6 F (37 C)] 98.6 F (37 C) (03/31 0346) Pulse Rate:  [89-94] 89 (03/31 0346) Resp:  [18] 18 (03/30 2203) BP: (103-110)/(54-71) 110/54 mmHg (03/31 0346) SpO2:  [94 %-96 %] 96 % (03/31 0346) Weight:  [262 lb 3.2 oz (118.933 kg)] 262 lb 3.2 oz (118.933 kg) (03/31 0346) Last BM Date: 12/18/15  Weight change: Filed Weights   12/16/15 0142 12/17/15 0924 12/18/15 0346  Weight: 264 lb 6.4 oz (119.931 kg) 264 lb 8.8 oz (120 kg) 262 lb 3.2 oz (118.933 kg)    Intake/Output:   Intake/Output Summary (Last 24 hours) at 12/18/15 1026 Last data filed at 12/18/15 0854  Gross per 24 hour  Intake   1320 ml  Output   1525 ml  Net   -205 ml     Physical Exam: General:  Elderly appearing. SOB at rest HEENT: normal Neck: supple. JVP 8-9 . Carotids 2+ bilat; no bruits. No lymphadenopathy or thyromegaly appreciated. Cor: PMI nondisplaced. Regular rate & rhythm. No rubs, gallops or murmurs. Lungs: Mild basilar crackles Abdomen: soft, NT, ND, no HSM. No bruits or masses. +BS . Extremities: no cyanosis, clubbing, rash. Trace - 1+ ankle edema, exquisitely tender. Neuro: alert & orientedx3, cranial nerves grossly intact. moves all 4 extremities w/o difficulty. Affect pleasant  Telemetry: Afib 80-90s  Labs: Basic Metabolic Panel:  Recent Labs Lab 12/15/15 1310 12/16/15 0432 12/17/15 0310 12/18/15 0448  NA 142 142 143 142  K 3.6 2.9* 3.7 3.7  CL 101 104 103 104  CO2 27 28 29 27   GLUCOSE 95 139* 114* 127*  BUN 26* 24* 20 20  CREATININE  1.64* 1.48* 1.53* 1.45*  CALCIUM 9.2 8.5* 8.8* 8.8*  MG  --   --  2.1  --     Liver  Function Tests: No results for input(s): AST, ALT, ALKPHOS, BILITOT, PROT, ALBUMIN in the last 168 hours. No results for input(s): LIPASE, AMYLASE in the last 168 hours. No results for input(s): AMMONIA in the last 168 hours.  CBC:  Recent Labs Lab 12/15/15 1310 12/16/15 0432 12/18/15 0448  WBC 7.6 6.7 5.8  NEUTROABS  --  4.3  --   HGB 12.8* 12.1* 12.0*  HCT 40.3 37.1* 38.4*  MCV 88.2 87.7 87.9  PLT 219 179 173    Cardiac Enzymes:  Recent Labs Lab 12/16/15 0432  TROPONINI 0.03    BNP: BNP (last 3 results)  Recent Labs  08/18/15 1718 08/26/15 0510 12/15/15 2100  BNP 830.0* 1054.2* 481.6*    ProBNP (last 3 results)  Recent Labs  10/06/15 1153 10/21/15 1106  PROBNP 474.0* 318.0*     CBG:  Recent Labs Lab 12/17/15 0550 12/17/15 1104 12/17/15 1616 12/17/15 2202 12/18/15 0610  GLUCAP 105* 142* 150* 136* 129*    Coagulation Studies: No results for input(s): LABPROT, INR in the last 72 hours.  Other results: EKG: 12/15/15 Afib 80s  Imaging: Ct Chest High Resolution  12/16/2015  CLINICAL DATA:  Cholelithiasis. EXAM: CT CHEST WITHOUT CONTRAST TECHNIQUE: Multidetector CT imaging of the chest was performed following the standard protocol without intravenous contrast. High resolution imaging of the lungs, as well as inspiratory and expiratory imaging, was performed. COMPARISON:  08/25/2015. FINDINGS: Mediastinum/Lymph Nodes: Mediastinal lymph nodes measure up to 13 mm in the lower right paratracheal station, similar. Hilar regions are difficult to definitively evaluate without IV contrast. Subcarinal lymph node measures 2.1 cm, previously 1.9 cm. No axillary adenopathy. Atherosclerotic calcification of the arterial vasculature, including three-vessel involvement of the coronary arteries. Ascending aorta measures 4.3 cm. Pulmonary arteries and heart are enlarged. No pericardial effusion. Lungs/Pleura: There is patchy ground-glass, interstitial coarsening,  traction bronchiectasis/ bronchiolectasis, subpleural reticulation and architectural distortion. Findings represent the residua of previously seen acute appearing patchy ground-glass consolidation on 08/25/2015. Minimal residual consolidation persists in the left upper and left lower lobes. Moderate bilateral pleural effusions, right greater than left. Airway is unremarkable. Upper abdomen: Visualized portion of the liver is unremarkable. Several small stones are seen in the gallbladder. Visualized portions of the adrenal glands, kidneys, spleen, pancreas, stomach and bowel are grossly unremarkable. Musculoskeletal: No worrisome lytic or sclerotic lesions. Degenerative changes are seen in the spine. IMPRESSION: 1. Pulmonary parenchymal pattern of fibrosis is most likely postinflammatory when compared with 08/25/2015. 2. Moderate bilateral pleural effusions, right greater than left. 3. Ascending aortic aneurysm. Recommend annual imaging followup by CTA or MRA. This recommendation follows 2010 ACCF/AHA/AATS/ACR/ASA/SCA/SCAI/SIR/STS/SVM Guidelines for the Diagnosis and Management of Patients with Thoracic Aortic Disease. Circulation. 2010; 121: [id number]. 4. Enlarged pulmonary arteries, indicative of pulmonary arterial hypertension. 5. Three-vessel coronary artery calcification and cardiac enlargement. Electronically Signed   By: Lorin Picket M.D.   On: 12/16/2015 12:16   Nm Pulmonary Perf And Vent  12/16/2015  CLINICAL DATA:  Shortness of breath.  Chronic renal failure EXAM: NUCLEAR MEDICINE VENTILATION - PERFUSION LUNG SCAN TECHNIQUE: Ventilation images were obtained in multiple projections using inhaled aerosol Tc-63m DTPA. Perfusion images were obtained in multiple projections after intravenous injection of Tc-15m MAA. RADIOPHARMACEUTICALS:  33 mCi Technetium-68m DTPA aerosol inhalation and 4.2 mCi Technetium-67m MAA IV COMPARISON:  Chest CT from earlier today FINDINGS: Few small  areas of matched peripheral  perfusion and ventilation defect. RPO projection right lung central photopenia attributed to the hilum or fissural fluid. There is cardiomegaly. IMPRESSION: Very low probability for pulmonary embolism by PIOPED II. Electronically Signed   By: Monte Fantasia M.D.   On: 12/16/2015 16:14      Medications:     Current Medications: . allopurinol  100 mg Oral BID  . apixaban  2.5 mg Oral BID  . capsaicin   Topical QHS  . carvedilol  3.125 mg Oral BID WC  . fluticasone  2 spray Each Nare Daily  . furosemide  20 mg Intravenous BID  . gabapentin  600 mg Oral BID  . insulin aspart  0-9 Units Subcutaneous TID WC  . mometasone-formoterol  2 puff Inhalation BID  . montelukast  10 mg Oral QHS  . pantoprazole  40 mg Oral Daily  . potassium chloride  40 mEq Oral BID  . simvastatin  20 mg Oral q1800  . tiotropium  18 mcg Inhalation Daily     Infusions:      Assessment   1. Acute on chronic respiratory failure 2. Acute on chronic systolic HF 3. Atrial Fibrillation, paroxysmal 4. CAD 5. HTN 6. HLD 7. CKD stage III 8. COPD 9. OSA with CPAP  Plan    ReDS Vest reading 44.  He is at least mildly volume overloaded will give 2 doses 80 mg IV lasix and follow up in am.  We do recommend that he stay for further diuresis.  He is in Afib.  Previously had been paroxysmal. His most recent EKG PTA was 10/09/15 which showed NSR.  ? If his worsening over the past several days has been due to recurrence of Afib.  He poorly tolerates BB with bradycardia. He has been on eliquis. He states he takes his medicine "religiuosly". Will discuss possible rhythm control with MD.   He remains SOB at rest. Will give IV lasix now.  He did not de-sat his 02 while ambulating with nurse yesterday.   Length of Stay: 3  Shirley Friar PA-C 12/18/2015, 10:26 AM  Advanced Heart Failure Team Pager [phone] (M-F; 7a - 4p)  Please contact [facility] for night-coverage after hours (4p -7a ) and weekends on  [email or web address]  Patient seen and examined with Oda Kilts, PA-C. We discussed all aspects of the encounter. I agree with the assessment and plan as stated above.   On exam seems to have mild volume overload but he is quite symptomatic and ReDS reading very high. Given stable renal function and clear urine, we will push lasix to 80 iv bid and see how he responds. I suspect we may not be able to get his baseline ReDS reading < 39 but we will do our best to help his symptoms. I wonder if recurrent AF is worsening his symptoms. Can plan DC-CV as needed given Eliquis therapy. Would likely need amio to maintain NSR.   Lugene Beougher,MD 11:56 AM

## 2015-12-18 NOTE — Discharge Summary (Signed)
Physician Discharge Summary  Christopher Holder [id number] DOB: 1929/11/21 DOA: 12/15/2015  PCP: Christopher Pai, PA-C  Admit date: 12/15/2015 Discharge date: 12/18/2015  Time spent: 35 minutes  Recommendations for Outpatient Follow-up:  1. Continue lasix 20 bid 2. Prednisone 10 mg given-->burst 3. Cbc + bmet in 1 week 4. Daily wght at facility  Discharge Diagnoses:  Principal Problem:   Acute respiratory failure with hypoxia ([place]) Active Problems:   Diabetes mellitus type 2, controlled ([place])   Coronary artery disease involving native coronary artery of native heart without angina pectoris   Obstructive sleep apnea   Acute respiratory failure (HCC)   NICM (nonischemic cardiomyopathy) ([place])   Hypoxia   Renal failure (ARF), acute on chronic [facility])    Discharge Condition: improved  Diet recommendation: HH low salt  Filed Weights   12/16/15 0142 12/17/15 0924 12/18/15 0346  Weight: 119.931 kg (264 lb 6.4 oz) 120 kg (264 lb 8.8 oz) 118.933 kg (262 lb 3.2 oz)    History of present illness:  82 year old male. CAD S/P stent 09/2015, CAD with PCI~2002? [facility]? Dr. Alejandro Holder Heart Ischemic cardiomyopathy based on echo 08/2015 Diabetes mellitus type 2 complicated by diabetic neuropathy COPD versus asthma Sleep apnea on nocturnal CPAP Prior admissions for falls 07/2015 and incubated GERD   Paroxysmal A. fib on anticoagulation, [place] score ~6on anticoagulation-complicated by sinus bradycardia--cardioverted 08/28/15 by Dr. Marlou Porch Also followed by Dr. Jonathon Holder evaluation by electrophysiology determined pacemaker was not required [10/09/15] With a QTC of 120-130 would not benefit from CRT  Admission 08/2015 ARF + septic shock + pneumonia. chronic lung disease followed by pulmonary   admitted to [facility] early morning 12/16/15 with multifactorial respiratory failure, systolic heart failure? Chronic lung disease  High-resolution CT scan  of the lungs as well as VQ scan were  negative for any acute findings Tells me that since November he has had difficulty with breathing He was admitted as above  He has had some lower extremity swelling. He has not had any fever any chills any other findings suggestive of either pneumonia or COPD exacerbation and in addition is not wheezing   Hospital Course:    Multifactorial dyspnea-Chest CT -post inflamm fibrosis, V/Q neg for PE  AECHF-diurese lasix double home dose-20 IV bid.  Currently -740 cc, NEED standing WEIGHT  AECOPD-continue dulera 2 pff bid, spiriva 18 mcg , Monteleukast 10 qhs--add steroid burst prednisone 10 daily to see if makes a difference.  Cont scheudled nebs as an outpatient with regards to further management. I would recommend outpatient workup with pulmonary given postinflammatory fibrosis as this may be related to multiple infections in the past and simply aging of the lungs. This referral should be made by nursing home physician. 1. CHr Afib, Chad=6-cont coreg 3.125, eliquis 2.5 bid 2. DM with dm neuropathy-Gabapentin dosing as per Pharmacy-has CKD-I have however resumed his home dosage of the gabapentin. I have added on this admission Isn't to Be Applied to the Top of the Foot for Polyneuropathy Cont ssi for now.  Restarted on d/chome glipizide 5 daily 3. CKD stg 3-4-monitor carefully on diuretics-rpt bmet am 4. GErd-Pantoprazole 40 qd 6. Morbid obesity, Body mass index is 35.87 kg/(m^2).   Consultations:  none  Discharge Exam: Filed Vitals:   12/17/15 2203 12/18/15 0346  BP: 103/58 110/54  Pulse: 90 89  Temp: 97.7 F (36.5 C) 98.6 F (37 C)  Resp: 18     Well eomincat  No n/v/cp Eating ok No chest pain No weakness  Cough decreased  No constipation   Discharge Instructions   Discharge Instructions    Diet - low sodium heart healthy    Complete by:  As directed      Discharge instructions    Complete by:  As directed   Continue lasix 20 mg, but take this x2 per day. Continue  other medications We will Rx for you some steroids for a short period of time-I do not think that your shortness of breath was just heart failure or copd-i think that it was both     Increase activity slowly    Complete by:  As directed           Current Discharge Medication List    START taking these medications   Details  predniSONE (DELTASONE) 5 MG tablet Take 2 tablets (10 mg total) by mouth daily with breakfast. Qty: 10 tablet, Refills: 0      CONTINUE these medications which have CHANGED   Details  carvedilol (COREG) 3.125 MG tablet Take 1 tablet (3.125 mg total) by mouth 2 (two) times daily with a meal.    furosemide (LASIX) 20 MG tablet Take 1 tablet (20 mg total) by mouth 2 (two) times daily. Take 1 tablet in the evening. Qty: 30 tablet      CONTINUE these medications which have NOT CHANGED   Details  albuterol (PROVENTIL HFA;VENTOLIN HFA) 108 (90 BASE) MCG/ACT inhaler Inhale 2 puffs into the lungs every 4 (four) hours as needed for wheezing or shortness of breath.     allopurinol (ZYLOPRIM) 100 MG tablet Take 1 tablet by mouth two  times daily Qty: 120 tablet, Refills: 1    DULERA 200-5 MCG/ACT AERO Use 2 puffs two times daily Qty: 26 g, Refills: 1    ELIQUIS 5 MG TABS tablet Take 1 tablet (5 mg total) by mouth 2 (two) times daily. Qty: 120 tablet, Refills: 2    fluticasone (FLONASE) 50 MCG/ACT nasal spray Use 2 sprays in each  nostril daily Qty: 32 g, Refills: 1    gabapentin (NEURONTIN) 600 MG tablet Take 1 tablet (600 mg total) by mouth 3 (three) times daily. Qty: 30 tablet, Refills: 0    glipiZIDE (GLUCOTROL) 5 MG tablet Take 5 mg by mouth.    metolazone (ZAROXOLYN) 2.5 MG tablet     montelukast (SINGULAIR) 10 MG tablet Take 1 tablet by mouth at  bedtime Qty: 90 tablet, Refills: 0    NON FORMULARY Take 15 each by mouth Nightly. CPAP at night    omeprazole (PRILOSEC) 20 MG capsule Take 1 capsule by mouth  daily Qty: 60 capsule, Refills: 1     simvastatin (ZOCOR) 20 MG tablet Take 1 tablet by mouth  daily Qty: 90 tablet, Refills: 1    tiotropium (SPIRIVA) 18 MCG inhalation capsule Place 1 capsule (18 mcg total) into inhaler and inhale daily. Qty: 30 capsule, Refills: 12      STOP taking these medications     potassium chloride (K-DUR,KLOR-CON) 10 MEQ tablet        Allergies  Allergen Reactions  . Percocet [Oxycodone-Acetaminophen] Diarrhea, Nausea And Vomiting and Nausea Only  . Sulfa Antibiotics Diarrhea and Nausea And Vomiting      The results of significant diagnostics from this hospitalization (including imaging, microbiology, ancillary and laboratory) are listed below for reference.    Significant Diagnostic Studies: Dg Chest 2 View  12/15/2015  CLINICAL DATA:  Four month history of shortness of Breath. EXAM: CHEST  2 VIEW COMPARISON:  Multiple prior CT scans and a chest CT from 08/25/2015. FINDINGS: The heart is borderline enlarged but stable. Mild tortuosity of the thoracic aorta. The pulmonary hila appear normal and stable. Significant chronic interstitial lung disease and pulmonary scarring. No acute overlying pulmonary process. No pleural effusion. The bony thorax is intact. IMPRESSION: Chronic interstitial lung disease and pulmonary scarring. Recommend pulmonary consultation and high-resolution chest CT for further evaluation (non urgent). No acute overlying pulmonary findings. Electronically Signed   By: Marijo Sanes M.D.   On: 12/15/2015 13:48   Ct Chest High Resolution  12/16/2015  CLINICAL DATA:  Cholelithiasis. EXAM: CT CHEST WITHOUT CONTRAST TECHNIQUE: Multidetector CT imaging of the chest was performed following the standard protocol without intravenous contrast. High resolution imaging of the lungs, as well as inspiratory and expiratory imaging, was performed. COMPARISON:  08/25/2015. FINDINGS: Mediastinum/Lymph Nodes: Mediastinal lymph nodes measure up to 13 mm in the lower right paratracheal station,  similar. Hilar regions are difficult to definitively evaluate without IV contrast. Subcarinal lymph node measures 2.1 cm, previously 1.9 cm. No axillary adenopathy. Atherosclerotic calcification of the arterial vasculature, including three-vessel involvement of the coronary arteries. Ascending aorta measures 4.3 cm. Pulmonary arteries and heart are enlarged. No pericardial effusion. Lungs/Pleura: There is patchy ground-glass, interstitial coarsening, traction bronchiectasis/ bronchiolectasis, subpleural reticulation and architectural distortion. Findings represent the residua of previously seen acute appearing patchy ground-glass consolidation on 08/25/2015. Minimal residual consolidation persists in the left upper and left lower lobes. Moderate bilateral pleural effusions, right greater than left. Airway is unremarkable. Upper abdomen: Visualized portion of the liver is unremarkable. Several small stones are seen in the gallbladder. Visualized portions of the adrenal glands, kidneys, spleen, pancreas, stomach and bowel are grossly unremarkable. Musculoskeletal: No worrisome lytic or sclerotic lesions. Degenerative changes are seen in the spine. IMPRESSION: 1. Pulmonary parenchymal pattern of fibrosis is most likely postinflammatory when compared with 08/25/2015. 2. Moderate bilateral pleural effusions, right greater than left. 3. Ascending aortic aneurysm. Recommend annual imaging followup by CTA or MRA. This recommendation follows 2010 ACCF/AHA/AATS/ACR/ASA/SCA/SCAI/SIR/STS/SVM Guidelines for the Diagnosis and Management of Patients with Thoracic Aortic Disease. Circulation. 2010; 121: [id number]. 4. Enlarged pulmonary arteries, indicative of pulmonary arterial hypertension. 5. Three-vessel coronary artery calcification and cardiac enlargement. Electronically Signed   By: Lorin Picket M.D.   On: 12/16/2015 12:16   Nm Pulmonary Perf And Vent  12/16/2015  CLINICAL DATA:  Shortness of breath.  Chronic renal  failure EXAM: NUCLEAR MEDICINE VENTILATION - PERFUSION LUNG SCAN TECHNIQUE: Ventilation images were obtained in multiple projections using inhaled aerosol Tc-41m DTPA. Perfusion images were obtained in multiple projections after intravenous injection of Tc-84m MAA. RADIOPHARMACEUTICALS:  33 mCi Technetium-2m DTPA aerosol inhalation and 4.2 mCi Technetium-107m MAA IV COMPARISON:  Chest CT from earlier today FINDINGS: Few small areas of matched peripheral perfusion and ventilation defect. RPO projection right lung central photopenia attributed to the hilum or fissural fluid. There is cardiomegaly. IMPRESSION: Very low probability for pulmonary embolism by PIOPED II. Electronically Signed   By: Monte Fantasia M.D.   On: 12/16/2015 16:14    Microbiology: No results found for this or any previous visit (from the past 240 hour(s)).   Labs: Basic Metabolic Panel:  Recent Labs Lab 12/15/15 1310 12/16/15 0432 12/17/15 0310 12/18/15 0448  NA 142 142 143 142  K 3.6 2.9* 3.7 3.7  CL 101 104 103 104  CO2 27 28 29 27   GLUCOSE 95 139* 114* 127*  BUN 26* 24* 20 20  CREATININE 1.64* 1.48*  1.53* 1.45*  CALCIUM 9.2 8.5* 8.8* 8.8*  MG  --   --  2.1  --    Liver Function Tests: No results for input(s): AST, ALT, ALKPHOS, BILITOT, PROT, ALBUMIN in the last 168 hours. No results for input(s): LIPASE, AMYLASE in the last 168 hours. No results for input(s): AMMONIA in the last 168 hours. CBC:  Recent Labs Lab 12/15/15 1310 12/16/15 0432 12/18/15 0448  WBC 7.6 6.7 5.8  NEUTROABS  --  4.3  --   HGB 12.8* 12.1* 12.0*  HCT 40.3 37.1* 38.4*  MCV 88.2 87.7 87.9  PLT 219 179 173   Cardiac Enzymes:  Recent Labs Lab 12/16/15 0432  TROPONINI 0.03   BNP: BNP (last 3 results)  Recent Labs  08/18/15 1718 08/26/15 0510 12/15/15 2100  BNP 830.0* 1054.2* 481.6*    ProBNP (last 3 results)  Recent Labs  10/06/15 1153 10/21/15 1106  PROBNP 474.0* 318.0*    CBG:  Recent Labs Lab  12/17/15 0550 12/17/15 1104 12/17/15 1616 12/17/15 2202 12/18/15 0610  GLUCAP 105* 142* 150* 136* 129*       Signed:  Nita Sells MD   [facility] 12/18/2015, 9:46 AM

## 2015-12-18 NOTE — Progress Notes (Signed)
CSW attempted to contact patient's daughter to provide bed offers, however received no answer. CSW was provided permission from granddaughter to leave voice message for daughter. CSW left voice message at 10:05am regarding bed offers. CSW also informed daughter that patient is medically stable and ready for discharge on today. CSW will continue to follow.    Lucius Conn, [place] Worker [facility] Ph: [phone]

## 2015-12-18 NOTE — Progress Notes (Signed)
ReDS Vest Discharge Study  The ReDS reading is:  ( >39) =44.  Your patient will not be discharged at this time and will have a AHF team consult.  Thank You   The research team

## 2015-12-19 DIAGNOSIS — I4891 Unspecified atrial fibrillation: Secondary | ICD-10-CM | POA: Diagnosis not present

## 2015-12-19 DIAGNOSIS — E785 Hyperlipidemia, unspecified: Secondary | ICD-10-CM | POA: Diagnosis not present

## 2015-12-19 DIAGNOSIS — Z833 Family history of diabetes mellitus: Secondary | ICD-10-CM | POA: Diagnosis not present

## 2015-12-19 DIAGNOSIS — I428 Other cardiomyopathies: Secondary | ICD-10-CM | POA: Diagnosis not present

## 2015-12-19 DIAGNOSIS — I130 Hypertensive heart and chronic kidney disease with heart failure and stage 1 through stage 4 chronic kidney disease, or unspecified chronic kidney disease: Secondary | ICD-10-CM | POA: Diagnosis not present

## 2015-12-19 DIAGNOSIS — I509 Heart failure, unspecified: Secondary | ICD-10-CM | POA: Diagnosis not present

## 2015-12-19 DIAGNOSIS — N183 Chronic kidney disease, stage 3 (moderate): Secondary | ICD-10-CM | POA: Diagnosis not present

## 2015-12-19 DIAGNOSIS — I5023 Acute on chronic systolic (congestive) heart failure: Secondary | ICD-10-CM

## 2015-12-19 DIAGNOSIS — G4733 Obstructive sleep apnea (adult) (pediatric): Secondary | ICD-10-CM | POA: Diagnosis not present

## 2015-12-19 DIAGNOSIS — E1122 Type 2 diabetes mellitus with diabetic chronic kidney disease: Secondary | ICD-10-CM | POA: Diagnosis not present

## 2015-12-19 DIAGNOSIS — J441 Chronic obstructive pulmonary disease with (acute) exacerbation: Secondary | ICD-10-CM | POA: Diagnosis not present

## 2015-12-19 DIAGNOSIS — I252 Old myocardial infarction: Secondary | ICD-10-CM | POA: Diagnosis not present

## 2015-12-19 DIAGNOSIS — E876 Hypokalemia: Secondary | ICD-10-CM | POA: Diagnosis not present

## 2015-12-19 DIAGNOSIS — E1140 Type 2 diabetes mellitus with diabetic neuropathy, unspecified: Secondary | ICD-10-CM | POA: Diagnosis not present

## 2015-12-19 DIAGNOSIS — R2681 Unsteadiness on feet: Secondary | ICD-10-CM | POA: Diagnosis not present

## 2015-12-19 DIAGNOSIS — K219 Gastro-esophageal reflux disease without esophagitis: Secondary | ICD-10-CM | POA: Diagnosis not present

## 2015-12-19 DIAGNOSIS — Z79899 Other long term (current) drug therapy: Secondary | ICD-10-CM | POA: Diagnosis not present

## 2015-12-19 DIAGNOSIS — I2510 Atherosclerotic heart disease of native coronary artery without angina pectoris: Secondary | ICD-10-CM | POA: Diagnosis not present

## 2015-12-19 DIAGNOSIS — I482 Chronic atrial fibrillation: Secondary | ICD-10-CM | POA: Diagnosis not present

## 2015-12-19 DIAGNOSIS — N179 Acute kidney failure, unspecified: Secondary | ICD-10-CM | POA: Diagnosis not present

## 2015-12-19 DIAGNOSIS — I10 Essential (primary) hypertension: Secondary | ICD-10-CM | POA: Diagnosis not present

## 2015-12-19 DIAGNOSIS — I429 Cardiomyopathy, unspecified: Secondary | ICD-10-CM | POA: Diagnosis not present

## 2015-12-19 DIAGNOSIS — J9600 Acute respiratory failure, unspecified whether with hypoxia or hypercapnia: Secondary | ICD-10-CM | POA: Diagnosis not present

## 2015-12-19 DIAGNOSIS — I5022 Chronic systolic (congestive) heart failure: Secondary | ICD-10-CM | POA: Diagnosis not present

## 2015-12-19 DIAGNOSIS — J9601 Acute respiratory failure with hypoxia: Secondary | ICD-10-CM | POA: Diagnosis not present

## 2015-12-19 DIAGNOSIS — J9621 Acute and chronic respiratory failure with hypoxia: Secondary | ICD-10-CM | POA: Diagnosis not present

## 2015-12-19 DIAGNOSIS — M6281 Muscle weakness (generalized): Secondary | ICD-10-CM | POA: Diagnosis not present

## 2015-12-19 DIAGNOSIS — Z7901 Long term (current) use of anticoagulants: Secondary | ICD-10-CM | POA: Diagnosis not present

## 2015-12-19 DIAGNOSIS — Z955 Presence of coronary angioplasty implant and graft: Secondary | ICD-10-CM | POA: Diagnosis not present

## 2015-12-19 DIAGNOSIS — I480 Paroxysmal atrial fibrillation: Secondary | ICD-10-CM | POA: Diagnosis not present

## 2015-12-19 DIAGNOSIS — J449 Chronic obstructive pulmonary disease, unspecified: Secondary | ICD-10-CM | POA: Diagnosis not present

## 2015-12-19 DIAGNOSIS — I159 Secondary hypertension, unspecified: Secondary | ICD-10-CM | POA: Diagnosis not present

## 2015-12-19 DIAGNOSIS — M1A39X1 Chronic gout due to renal impairment, multiple sites, with tophus (tophi): Secondary | ICD-10-CM | POA: Diagnosis not present

## 2015-12-19 DIAGNOSIS — E119 Type 2 diabetes mellitus without complications: Secondary | ICD-10-CM | POA: Diagnosis not present

## 2015-12-19 DIAGNOSIS — Z8249 Family history of ischemic heart disease and other diseases of the circulatory system: Secondary | ICD-10-CM | POA: Diagnosis not present

## 2015-12-19 DIAGNOSIS — Z7984 Long term (current) use of oral hypoglycemic drugs: Secondary | ICD-10-CM | POA: Diagnosis not present

## 2015-12-19 LAB — BASIC METABOLIC PANEL
Anion gap: 9 (ref 5–15)
BUN: 24 mg/dL — AB (ref 6–20)
CHLORIDE: 102 mmol/L (ref 101–111)
CO2: 30 mmol/L (ref 22–32)
Calcium: 8.8 mg/dL — ABNORMAL LOW (ref 8.9–10.3)
Creatinine, Ser: 1.48 mg/dL — ABNORMAL HIGH (ref 0.61–1.24)
GFR calc Af Amer: 49 mL/min — ABNORMAL LOW (ref >60)
GFR, EST NON AFRICAN AMERICAN: 42 mL/min — AB (ref >60)
GLUCOSE: 125 mg/dL — AB (ref 65–99)
POTASSIUM: 3.5 mmol/L (ref 3.5–5.1)
Sodium: 141 mmol/L (ref 135–145)

## 2015-12-19 LAB — GLUCOSE, CAPILLARY
Glucose-Capillary: 112 mg/dL — ABNORMAL HIGH (ref 65–99)
Glucose-Capillary: 127 mg/dL — ABNORMAL HIGH (ref 65–99)

## 2015-12-19 MED ORDER — POTASSIUM CHLORIDE CRYS ER 20 MEQ PO TBCR
40.0000 meq | EXTENDED_RELEASE_TABLET | Freq: Once | ORAL | Status: AC
  Administered 2015-12-19: 40 meq via ORAL
  Filled 2015-12-19: qty 2

## 2015-12-19 MED ORDER — POTASSIUM CHLORIDE CRYS ER 20 MEQ PO TBCR: 40.0000 meq | EXTENDED_RELEASE_TABLET | Freq: Once | ORAL | Status: DC

## 2015-12-19 MED ORDER — LOSARTAN POTASSIUM 25 MG PO TABS
25.0000 mg | ORAL_TABLET | Freq: Every day | ORAL | Status: DC
  Administered 2015-12-19: 25 mg via ORAL
  Filled 2015-12-19: qty 1

## 2015-12-19 MED ORDER — FUROSEMIDE 20 MG PO TABS: 80.0000 mg | ORAL_TABLET | Freq: Every morning | ORAL | Status: DC

## 2015-12-19 MED ORDER — FUROSEMIDE 10 MG/ML IJ SOLN
INTRAMUSCULAR | Status: AC
  Filled 2015-12-19: qty 8

## 2015-12-19 MED ORDER — FUROSEMIDE 40 MG PO TABS: 40.0000 mg | ORAL_TABLET | Freq: Every evening | ORAL | Status: DC

## 2015-12-19 MED ORDER — FUROSEMIDE 10 MG/ML IJ SOLN
80.0000 mg | Freq: Once | INTRAMUSCULAR | Status: AC
  Administered 2015-12-19: 80 mg via INTRAVENOUS

## 2015-12-19 MED ORDER — LOSARTAN POTASSIUM 25 MG PO TABS: 25.0000 mg | ORAL_TABLET | Freq: Every day | ORAL | Status: DC

## 2015-12-19 NOTE — Significant Event (Signed)
See d/c summary from prior Note changes to lasix and is on REDS VEST trail per Dr. Tempie Hoist.  Verneita Griffes, MD [facility] [phone]

## 2015-12-19 NOTE — Progress Notes (Signed)
Advanced Heart Failure Team Rounding Note    Subjective:    Christopher Holder is a 82 y.o. male with history of CAD s/p D1 stent (patent on repeat cath 123XX123), chronic systolic CHF EF 123XX123, HTN, HLD, DM, CKD, OSA with CPAP, Paroxysmal Afib, and COPD.   ReDS reading 44% yesterday. Started back on IV lasix. Down 3 more pounds. Renal function stable. Breathing much better.    Objective:    Vital Signs:   Temp:  [97.9 F (36.6 C)-98 F (36.7 C)] 98 F (36.7 C) (04/01 0627) Pulse Rate:  [73-87] 73 (04/01 0627) Resp:  [18] 18 (04/01 0627) BP: (96-116)/(54-74) 109/74 mmHg (04/01 0627) SpO2:  [93 %-98 %] 98 % (04/01 0627) Weight:  [117.89 kg (259 lb 14.4 oz)] 117.89 kg (259 lb 14.4 oz) (04/01 0627) Last BM Date: 12/18/15  Weight change: Filed Weights   12/17/15 0924 12/18/15 0346 12/19/15 0627  Weight: 120 kg (264 lb 8.8 oz) 118.933 kg (262 lb 3.2 oz) 117.89 kg (259 lb 14.4 oz)    Intake/Output:   Intake/Output Summary (Last 24 hours) at 12/19/15 0842 Last data filed at 12/19/15 [id number]  Gross per 24 hour  Intake   1420 ml  Output   2550 ml  Net  -1130 ml     Physical Exam: General:  Elderly appearing. No SOB HEENT: normal Neck: supple. JVP 5-6 . Carotids 2+ bilat; no bruits. No lymphadenopathy or thyromegaly appreciated. Cor: PMI nondisplaced. Iregular rate & rhythm. No rubs, gallops or murmurs. Lungs: clear Abdomen: soft, NT, ND, no HSM. No bruits or masses. +BS . Extremities: no cyanosis, clubbing, rash. Trace  ankle edema, exquisitely tender. Neuro: alert & orientedx3, cranial nerves grossly intact. moves all 4 extremities w/o difficulty. Affect pleasant  Telemetry: Afib 70-90s  Labs: Basic Metabolic Panel:  Recent Labs Lab 12/15/15 1310 12/16/15 0432 12/17/15 0310 12/18/15 0448 12/19/15 0339  NA 142 142 143 142 141  K 3.6 2.9* 3.7 3.7 3.5  CL 101 104 103 104 102  CO2 27 28 29 27 30   GLUCOSE 95 139* 114* 127* 125*  BUN 26* 24* 20 20 24*  CREATININE  1.64* 1.48* 1.53* 1.45* 1.48*  CALCIUM 9.2 8.5* 8.8* 8.8* 8.8*  MG  --   --  2.1  --   --     Liver Function Tests: No results for input(s): AST, ALT, ALKPHOS, BILITOT, PROT, ALBUMIN in the last 168 hours. No results for input(s): LIPASE, AMYLASE in the last 168 hours. No results for input(s): AMMONIA in the last 168 hours.  CBC:  Recent Labs Lab 12/15/15 1310 12/16/15 0432 12/18/15 0448  WBC 7.6 6.7 5.8  NEUTROABS  --  4.3  --   HGB 12.8* 12.1* 12.0*  HCT 40.3 37.1* 38.4*  MCV 88.2 87.7 87.9  PLT 219 179 173    Cardiac Enzymes:  Recent Labs Lab 12/16/15 0432  TROPONINI 0.03    BNP: BNP (last 3 results)  Recent Labs  08/18/15 1718 08/26/15 0510 12/15/15 2100  BNP 830.0* 1054.2* 481.6*    ProBNP (last 3 results)  Recent Labs  10/06/15 1153 10/21/15 1106  PROBNP 474.0* 318.0*     CBG:  Recent Labs Lab 12/18/15 0610 12/18/15 1223 12/18/15 1656 12/18/15 2152 12/19/15 0628  GLUCAP 129* 120* 109* 121* 112*    Coagulation Studies: No results for input(s): LABPROT, INR in the last 72 hours.  Other results: EKG: 12/15/15 Afib 80s  Imaging: No results found.   Medications:  Current Medications: . allopurinol  100 mg Oral BID  . apixaban  2.5 mg Oral BID  . capsaicin   Topical QHS  . carvedilol  3.125 mg Oral BID WC  . fluticasone  2 spray Each Nare Daily  . gabapentin  600 mg Oral BID  . insulin aspart  0-9 Units Subcutaneous TID WC  . mometasone-formoterol  2 puff Inhalation BID  . montelukast  10 mg Oral QHS  . pantoprazole  40 mg Oral Daily  . potassium chloride  40 mEq Oral BID  . simvastatin  20 mg Oral q1800  . tiotropium  18 mcg Inhalation Daily    Infusions:     Assessment   1. Acute on chronic respiratory failure 2. Acute on chronic systolic HF 3. Atrial Fibrillation, paroxysmal 4. CAD 5. HTN 6. HLD 7. CKD stage III 8. COPD 9. OSA with CPAP 10. hypokalemia  Plan    ReDS Vest reading 44 on 3/31  He is  down 3 pounds with IV diuresis. Renal function stable. Breathing better. Will give one more dose of IV lasix and let him go home after lunch. Have let ReDS study team know and they will remeasure today prior to d/c. Supp K+  Would increase home lasix to 80/40. Apparently not on ACE/ARB as pulmonary was worried about ACE cough in setting of COPD. Would start losartan 25mg  qhs.  He has PAF. Well rate controlled. On Eliquis. If continues to be symptomatic can consider DC-CV in future but would likely need amio to hold NSR.  Will arrange f/u in [place], Makira Holleman,MD 8:42 AM

## 2015-12-19 NOTE — Progress Notes (Signed)
Pt d/c'd to [facility]. Report given to [facility]

## 2015-12-19 NOTE — Progress Notes (Signed)
Heart Failure Navigator Consult Note  Presentation: Christopher Holder presented with is a 82 y.o. male with history of CAD s/p D1 stent (patent on repeat cath 123XX123), chronic systolic CHF EF 123XX123, HTN, HLD, DM, CKD, OSA with CPAP, Paroxysmal Afib, and COPD.   He states he has a "large heart attack" in 2002 and was emergently stented at a hospital in [place]. Was told part of his heart was "dead" and would likely never work again.   Echo 08/21/15 LVEF 25-30%, Severe diffuse HK, Moderate MR, PA peak pressure 41 mm Hg  Nuclear Stress 09/25/15 - Indications, with elevated troponins with acute illness and low EF on echo. Showed EF 36%, High risk study with severe LVE suggestion of TID. Small inferior wall infarct from apex to base.   LHC 10/02/15 with normal coronaries and widely patent D1 stent, no culprit lesions for previous HIGH RISK stress test. Elevated LVEDP and severe LV systolic dysfunction.   At home patient has felt SOB since November, especially worse over the last 3 days. Of note he was seen by Dr Rayann Heman in 09/2015 and was in NSR. He was SOB with minimal exertion. + orthopnea. Gets dizziness and lightheadedness when he bends over, so avoids doing so. He had not been weighing daily or watching his diet or fluid in take. He was compliant with his medications at home, and uses his CPAP every night. His granddaughter lives with him, but she does not manage his care.   He was sent to the ER 12/15/15 from his PCP office with worsening SOB. Of note, he was treated for ARF and septic shock from [facility] in 08/2015. Has had gradually worsening DOE since that time. He has had no productive cough, fever, chills, or CP. Pertinent labs on admission include Creatinine 1.6, K 3.6, BNP 481.6, Troponins flat. CXR showed fibrosis thought to be postinflammatory when compared with previous CXR. Also showed moderate bilateral pleural effusions, R>L and enlarged pulmonary arteries, indicative of PAH. NM perfusion  and vent scan with very low probability of PE, had small areas of matched defect. EKG with Afib.    Past Medical History  Diagnosis Date  . Diabetes mellitus without complication ([place])   . CHF (congestive heart failure) ([place])   . Coronary artery disease   . COPD (chronic obstructive pulmonary disease) ([place])   . Asthma   . Hypertension   . HOH (hard of hearing)   . Sleep apnea   . Myocardial infarction ([place])   . Hyperlipidemia 05/27/2015  . GERD (gastroesophageal reflux disease) 05/27/2015  . AKI (acute kidney injury) ([place]) 08/18/2015    Social History   Social History  . Marital Status: Widowed    Spouse Name: [name]  . Number of Children: [name]  . Years of Education: [name]   Social History Main Topics  . Smoking status: Never Smoker   . Smokeless tobacco: Never Used  . Alcohol Use: No  . Drug Use: No  . Sexual Activity: Not Asked   Other Topics Concern  . None   Social History Narrative    ECHO:Study Conclusions-08/21/15  - Left ventricle: The cavity size was mildly dilated. There was  mild concentric hypertrophy. Systolic function was severely  reduced. The estimated ejection fraction was in the range of 25%  to 30%. Severe diffuse hypokinesis with no identifiable regional  variations. Acoustic contrast opacification revealed no evidence  ofthrombus. - Aortic valve: There was moderate regurgitation. - Ascending aorta: The ascending aorta was mildly dilated. -  Left atrium: The atrium was mildly dilated. - Right ventricle: The cavity size was mildly dilated. - Pulmonary arteries: Systolic pressure was mildly increased. PA  peak pressure: 41 mm Hg (S).  Echocardiography. M-mode, complete 2D, spectral Doppler, and color Doppler. Birthdate: Patient birthdate: [birth year]. Age: Patient is 82 yr old. Sex: Gender: male. BMI: 37.6 kg/m^2. Blood pressure: 112/59 Patient status: Inpatient. Study date: Study date: 08/21/2015. Study time: 02:28 PM.  Location: ICU/CCU  BNP    Component Value Date/Time   BNP 481.6* 12/15/2015 2100    ProBNP    Component Value Date/Time   PROBNP 318.0* 10/21/2015 1106     Education Assessment and Provision:  Detailed education and instructions provided on heart failure disease management including the following:  Signs and symptoms of Heart Failure When to call the physician Importance of daily weights Low sodium diet Fluid restriction Medication management Anticipated future follow-up appointments  Patient education given on each of the above topics.  Patient acknowledges understanding and acceptance of all instructions.  I spoke to patient regarding his HF.  He usually lives with his granddaughter --however has plans to go to rehab after this hospital stay.  He has a scale at home and weighs each day.  I reviewed the importance of daily weights and how they relate to the signs and symptoms of HF.  He was also able to teach back high sodium foods to avoid and tells me that he never uses "a salt shaker".  I reviewed a low sodium diet.  He denies any issues with getting or taking prescribed medications.  He will have follow-up in the AHF Clinic and I have given him appt and directions to get to the clinic.  Education Materials:  "Living Better With Heart Failure" Booklet, Daily Weight Tracker Tool    High Risk Criteria for Readmission and/or Poor Patient Outcomes:  (Recommend Follow-up with Advanced Heart Failure Clinic)--yes   EF <30%-  25-30%  2 or more admissions in 6 months- yes 2/6  Difficult social situation- No-plans to go to SNF--? Ability to provide self care  Demonstrates medication noncompliance- No    Barriers of Care:  Knowledge and compliance  Discharge Planning:   Plans to go to rehab and then return to home in [place] with his granddaughter.

## 2015-12-19 NOTE — Progress Notes (Signed)
CSW spoke with patient's daughter Mardene Celeste regarding facility selection. Per Mardene Celeste, she has not chosen a facility at this time. CSW informed daughter that patient is medically stable and ready for discharge on today. Daughter reports there was a misunderstanding of report provided on yesterday. CSW provided daughter brief support and understanding. CSW refaxed facility offers to daughter. Daughter to follow back up with CSW.   Lucius Conn, [place] Worker [facility] Ph: [phone]

## 2015-12-19 NOTE — Clinical Social Work Placement (Signed)
   CLINICAL SOCIAL WORK PLACEMENT  NOTE  Date:  12/19/2015  Patient Details  Name: Christopher Holder MRN: [id number] Date of Birth: June 06, 1930  Clinical Social Work is seeking post-discharge placement for this patient at the [place] level of care (*CSW will initial, date and re-position this form in  chart as items are completed):      Patient/family provided with [place] Work Department's list of facilities offering this level of care within the geographic area requested by the patient (or if unable, by the patient's family).  Yes   Patient/family informed of their freedom to choose among providers that offer the needed level of care, that participate in Medicare, Medicaid or managed care program needed by the patient, have an available bed and are willing to accept the patient.  Yes   Patient/family informed of 's ownership interest in [facility] and [facility], as well as of the fact that they are under no obligation to receive care at these facilities.  PASRR submitted to EDS on 12/17/15     PASRR number received on 12/17/15     Existing PASRR number confirmed on       FL2 transmitted to all facilities in geographic area requested by pt/family on 12/17/15     FL2 transmitted to all facilities within larger geographic area on       Patient informed that his/her managed care company has contracts with or will negotiate with certain facilities, including the following:   ([place]- Medicare)     Yes   Patient/family informed of bed offers received.  Patient chooses bed at [facility]     Physician recommends and patient chooses bed at      Patient to be transferred to [facility] on 12/19/15.  Patient to be transferred to facility by Ambulance Corey Harold)     Patient family notified on 12/19/15 of transfer.  Name of family member notified:  [name]- Daughter     PHYSICIAN Please prepare  priority discharge summary, including medications, Please sign FL2, Please prepare prescriptions     Additional Comment:  Ok per MD for d/c today to SNF at [facility]. Patient and daughter are aware and agreeable to dc. EMS arranged and nursing notified to call report to facility.  DC summary sent to facility for review.  No further CSW needs identified. Patient is requesting short term SNF for rehab.     _______________________________________________ Williemae Area, LCSW 12/19/2015, 4:28 PM  [phone] (weekend coverage)

## 2015-12-19 NOTE — Progress Notes (Signed)
Patient had a repeat ReDS vest reading = 40.  The reading was verified by Racquel with [employer].  Dr Haroldine Laws aware and will proceed with discharge  Thank You   The research team

## 2015-12-19 NOTE — Discharge Summary (Addendum)
Physician Discharge Summary  Christopher Holder [id number] DOB: 01-07-1930 DOA: 12/15/2015  PCP: Mackie Pai, PA-C  Admit date: 12/15/2015 Discharge date: 12/19/2015  Time spent: 35 minutes  Recommendations for Outpatient Follow-up:  1. Continue lasix 80 am, 40 pm 2. Prednisone 10 mg given-->burst 3. Cbc + bmet in 1 week 4. Daily wght at facility  Discharge Diagnoses:  Principal Problem:   Acute respiratory failure with hypoxia ([place]) Active Problems:   Diabetes mellitus type 2, controlled ([place])   Coronary artery disease involving native coronary artery of native heart without angina pectoris   Obstructive sleep apnea   Acute respiratory failure (HCC)   NICM (nonischemic cardiomyopathy) ([place])   Hypoxia   Renal failure (ARF), acute on chronic [facility])    Discharge Condition: improved  Diet recommendation: HH low salt  Filed Weights   12/17/15 0924 12/18/15 0346 12/19/15 0627  Weight: 120 kg (264 lb 8.8 oz) 118.933 kg (262 lb 3.2 oz) 117.89 kg (259 lb 14.4 oz)    History of present illness:  82 year old male. CAD S/P stent 09/2015, CAD with PCI~2002? [facility]? Dr. Alejandro Mulling Heart Ischemic cardiomyopathy based on echo 08/2015 Diabetes mellitus type 2 complicated by diabetic neuropathy COPD versus asthma Sleep apnea on nocturnal CPAP Prior admissions for falls 07/2015 and incubated GERD   Paroxysmal A. fib on anticoagulation, [place] score ~6on anticoagulation-complicated by sinus bradycardia--cardioverted 08/28/15 by Dr. Marlou Porch Also followed by Dr. Jonathon Bellows evaluation by electrophysiology determined pacemaker was not required [10/09/15] With a QTC of 120-130 would not benefit from CRT  Admission 08/2015 ARF + septic shock + pneumonia. chronic lung disease followed by pulmonary   admitted to [facility] early morning 12/16/15 with multifactorial respiratory failure, systolic heart failure? Chronic lung disease  High-resolution CT scan  of the lungs as well as VQ scan  were negative for any acute findings Tells me that since November he has had difficulty with breathing He was admitted as above  He has had some lower extremity swelling. He has not had any fever any chills any other findings suggestive of either pneumonia or COPD exacerbation and in addition is not wheezing   Hospital Course:    Multifactorial dyspnea-Chest CT -post inflamm fibrosis, V/Q neg for PE  AECHF-diurese lasix d80 a, 40 p as per ACHF team-close monitoring as OP Dr. Tempie Hoist  since admit -3.315 cc, 119-->117 kg on d/c  AECOPD-continue dulera 2 pff bid, spiriva 18 mcg , Monteleukast 10 qhs--add steroid burst prednisone 10 daily to see if makes a difference.  Cont scheudled nebs as an outpatient with regards to further management. I would recommend outpatient workup with pulmonary given postinflammatory fibrosis as this may be related to multiple infections in the past and simply aging of the lungs. This referral should be made by nursing home physician. 1. CHr Afib, Chad=6-cont coreg 3.125, eliquis 2.5 bid 2. DM with dm neuropathy-Gabapentin dosing as per Pharmacy-has CKD-I have however resumed his home dosage of the gabapentin. I have added on this admission Isn't to Be Applied to the Top of the Foot for Polyneuropathy Cont ssi for now.  Restarted on d/chome glipizide 5 daily 3. CKD stg 3-4-monitor carefully on diuretics-rpt bmet am 4. GErd-Pantoprazole 40 qd 6. Morbid obesity, Body mass index is 35.87 kg/(m^2).   Consultations:  none  Discharge Exam: Filed Vitals:   12/18/15 2024 12/19/15 0627  BP: 115/61 109/74  Pulse: 87 73  Temp: 97.9 F (36.6 C) 98 F (36.7 C)  Resp: 18 18  Well eomincat  No n/v/cp Eating ok No chest pain No weakness Cough decreased no rales, no jvd No constipation   Discharge Instructions   Discharge Instructions    Diet - low sodium heart healthy    Complete by:  As directed      Discharge instructions    Complete by:  As directed    Continue lasix as written. Continue other medications We will Rx for you some steroids for a short period of time-I do not think that your shortness of breath was just heart failure or copd-i think that it was both     Discharge patient    Complete by:  As directed      Discharge to SNF when bed available    Complete by:  As directed      Increase activity slowly    Complete by:  As directed           Current Discharge Medication List    START taking these medications   Details  capsaicin (ZOSTRIX) 0.025 % cream Apply topically at bedtime. Qty: 60 g, Refills: 0    losartan (COZAAR) 25 MG tablet Take 1 tablet (25 mg total) by mouth daily. Qty: 30 tablet, Refills: 0    predniSONE (DELTASONE) 5 MG tablet Take 2 tablets (10 mg total) by mouth daily with breakfast. Qty: 10 tablet, Refills: 0      CONTINUE these medications which have CHANGED   Details  carvedilol (COREG) 3.125 MG tablet Take 1 tablet (3.125 mg total) by mouth 2 (two) times daily with a meal.    !! furosemide (LASIX) 20 MG tablet Take 4 tablets (80 mg total) by mouth every morning. Take 1 tablet in the evening. Qty: 30 tablet    !! furosemide (LASIX) 40 MG tablet Take 1 tablet (40 mg total) by mouth every evening. Qty: 30 tablet, Refills: 0    potassium chloride SA (K-DUR,KLOR-CON) 20 MEQ tablet Take 2 tablets (40 mEq total) by mouth once. Qty: 60 tablet     !! - Potential duplicate medications found. Please discuss with provider.    CONTINUE these medications which have NOT CHANGED   Details  albuterol (PROVENTIL HFA;VENTOLIN HFA) 108 (90 BASE) MCG/ACT inhaler Inhale 2 puffs into the lungs every 4 (four) hours as needed for wheezing or shortness of breath.     allopurinol (ZYLOPRIM) 100 MG tablet Take 1 tablet by mouth two  times daily Qty: 120 tablet, Refills: 1    DULERA 200-5 MCG/ACT AERO Use 2 puffs two times daily Qty: 26 g, Refills: 1    ELIQUIS 5 MG TABS tablet Take 1 tablet (5 mg total) by  mouth 2 (two) times daily. Qty: 120 tablet, Refills: 2    fluticasone (FLONASE) 50 MCG/ACT nasal spray Use 2 sprays in each  nostril daily Qty: 32 g, Refills: 1    gabapentin (NEURONTIN) 600 MG tablet Take 1 tablet (600 mg total) by mouth 3 (three) times daily. Qty: 30 tablet, Refills: 0    glipiZIDE (GLUCOTROL) 5 MG tablet Take 5 mg by mouth.    metolazone (ZAROXOLYN) 2.5 MG tablet     montelukast (SINGULAIR) 10 MG tablet Take 1 tablet by mouth at  bedtime Qty: 90 tablet, Refills: 0    NON FORMULARY Take 15 each by mouth Nightly. CPAP at night    omeprazole (PRILOSEC) 20 MG capsule Take 1 capsule by mouth  daily Qty: 60 capsule, Refills: 1    simvastatin (ZOCOR) 20 MG tablet Take  1 tablet by mouth  daily Qty: 90 tablet, Refills: 1    tiotropium (SPIRIVA) 18 MCG inhalation capsule Place 1 capsule (18 mcg total) into inhaler and inhale daily. Qty: 30 capsule, Refills: 12       Allergies  Allergen Reactions  . Percocet [Oxycodone-Acetaminophen] Diarrhea, Nausea And Vomiting and Nausea Only  . Sulfa Antibiotics Diarrhea and Nausea And Vomiting   Follow-up Information    Follow up with Mackie Pai, PA-C On 12/25/2015.   Specialties:  Internal Medicine, Family Medicine   Why:  at 9:00am   Contact information:   [place] [address] [phone]        The results of significant diagnostics from this hospitalization (including imaging, microbiology, ancillary and laboratory) are listed below for reference.    Significant Diagnostic Studies: Dg Chest 2 View  12/15/2015  CLINICAL DATA:  Four month history of shortness of Breath. EXAM: CHEST  2 VIEW COMPARISON:  Multiple prior CT scans and a chest CT from 08/25/2015. FINDINGS: The heart is borderline enlarged but stable. Mild tortuosity of the thoracic aorta. The pulmonary hila appear normal and stable. Significant chronic interstitial lung disease and pulmonary scarring. No acute overlying  pulmonary process. No pleural effusion. The bony thorax is intact. IMPRESSION: Chronic interstitial lung disease and pulmonary scarring. Recommend pulmonary consultation and high-resolution chest CT for further evaluation (non urgent). No acute overlying pulmonary findings. Electronically Signed   By: Marijo Sanes M.D.   On: 12/15/2015 13:48   Ct Chest High Resolution  12/16/2015  CLINICAL DATA:  Cholelithiasis. EXAM: CT CHEST WITHOUT CONTRAST TECHNIQUE: Multidetector CT imaging of the chest was performed following the standard protocol without intravenous contrast. High resolution imaging of the lungs, as well as inspiratory and expiratory imaging, was performed. COMPARISON:  08/25/2015. FINDINGS: Mediastinum/Lymph Nodes: Mediastinal lymph nodes measure up to 13 mm in the lower right paratracheal station, similar. Hilar regions are difficult to definitively evaluate without IV contrast. Subcarinal lymph node measures 2.1 cm, previously 1.9 cm. No axillary adenopathy. Atherosclerotic calcification of the arterial vasculature, including three-vessel involvement of the coronary arteries. Ascending aorta measures 4.3 cm. Pulmonary arteries and heart are enlarged. No pericardial effusion. Lungs/Pleura: There is patchy ground-glass, interstitial coarsening, traction bronchiectasis/ bronchiolectasis, subpleural reticulation and architectural distortion. Findings represent the residua of previously seen acute appearing patchy ground-glass consolidation on 08/25/2015. Minimal residual consolidation persists in the left upper and left lower lobes. Moderate bilateral pleural effusions, right greater than left. Airway is unremarkable. Upper abdomen: Visualized portion of the liver is unremarkable. Several small stones are seen in the gallbladder. Visualized portions of the adrenal glands, kidneys, spleen, pancreas, stomach and bowel are grossly unremarkable. Musculoskeletal: No worrisome lytic or sclerotic lesions.  Degenerative changes are seen in the spine. IMPRESSION: 1. Pulmonary parenchymal pattern of fibrosis is most likely postinflammatory when compared with 08/25/2015. 2. Moderate bilateral pleural effusions, right greater than left. 3. Ascending aortic aneurysm. Recommend annual imaging followup by CTA or MRA. This recommendation follows 2010 ACCF/AHA/AATS/ACR/ASA/SCA/SCAI/SIR/STS/SVM Guidelines for the Diagnosis and Management of Patients with Thoracic Aortic Disease. Circulation. 2010; 121: [id number]. 4. Enlarged pulmonary arteries, indicative of pulmonary arterial hypertension. 5. Three-vessel coronary artery calcification and cardiac enlargement. Electronically Signed   By: Lorin Picket M.D.   On: 12/16/2015 12:16   Nm Pulmonary Perf And Vent  12/16/2015  CLINICAL DATA:  Shortness of breath.  Chronic renal failure EXAM: NUCLEAR MEDICINE VENTILATION - PERFUSION LUNG SCAN TECHNIQUE: Ventilation images were obtained in  multiple projections using inhaled aerosol Tc-76m DTPA. Perfusion images were obtained in multiple projections after intravenous injection of Tc-65m MAA. RADIOPHARMACEUTICALS:  33 mCi Technetium-50m DTPA aerosol inhalation and 4.2 mCi Technetium-70m MAA IV COMPARISON:  Chest CT from earlier today FINDINGS: Few small areas of matched peripheral perfusion and ventilation defect. RPO projection right lung central photopenia attributed to the hilum or fissural fluid. There is cardiomegaly. IMPRESSION: Very low probability for pulmonary embolism by PIOPED II. Electronically Signed   By: Monte Fantasia M.D.   On: 12/16/2015 16:14    Microbiology: No results found for this or any previous visit (from the past 240 hour(s)).   Labs: Basic Metabolic Panel:  Recent Labs Lab 12/15/15 1310 12/16/15 0432 12/17/15 0310 12/18/15 0448 12/19/15 0339  NA 142 142 143 142 141  K 3.6 2.9* 3.7 3.7 3.5  CL 101 104 103 104 102  CO2 27 28 29 27 30   GLUCOSE 95 139* 114* 127* 125*  BUN 26* 24* 20 20 24*   CREATININE 1.64* 1.48* 1.53* 1.45* 1.48*  CALCIUM 9.2 8.5* 8.8* 8.8* 8.8*  MG  --   --  2.1  --   --    Liver Function Tests: No results for input(s): AST, ALT, ALKPHOS, BILITOT, PROT, ALBUMIN in the last 168 hours. No results for input(s): LIPASE, AMYLASE in the last 168 hours. No results for input(s): AMMONIA in the last 168 hours. CBC:  Recent Labs Lab 12/15/15 1310 12/16/15 0432 12/18/15 0448  WBC 7.6 6.7 5.8  NEUTROABS  --  4.3  --   HGB 12.8* 12.1* 12.0*  HCT 40.3 37.1* 38.4*  MCV 88.2 87.7 87.9  PLT 219 179 173   Cardiac Enzymes:  Recent Labs Lab 12/16/15 0432  TROPONINI 0.03   BNP: BNP (last 3 results)  Recent Labs  08/18/15 1718 08/26/15 0510 12/15/15 2100  BNP 830.0* 1054.2* 481.6*    ProBNP (last 3 results)  Recent Labs  10/06/15 1153 10/21/15 1106  PROBNP 474.0* 318.0*    CBG:  Recent Labs Lab 12/18/15 0610 12/18/15 1223 12/18/15 1656 12/18/15 2152 12/19/15 0628  GLUCAP 129* 120* 109* 121* 112*       Signed:  Nita Sells MD   [facility] 12/19/2015, 10:51 AM

## 2015-12-24 DIAGNOSIS — I509 Heart failure, unspecified: Secondary | ICD-10-CM | POA: Diagnosis not present

## 2015-12-24 DIAGNOSIS — J449 Chronic obstructive pulmonary disease, unspecified: Secondary | ICD-10-CM | POA: Diagnosis not present

## 2015-12-24 DIAGNOSIS — I10 Essential (primary) hypertension: Secondary | ICD-10-CM | POA: Diagnosis not present

## 2015-12-24 DIAGNOSIS — E119 Type 2 diabetes mellitus without complications: Secondary | ICD-10-CM | POA: Diagnosis not present

## 2015-12-25 ENCOUNTER — Ambulatory Visit: Payer: Medicare Other | Admitting: Medical

## 2015-12-25 ENCOUNTER — Other Ambulatory Visit: Payer: Self-pay

## 2015-12-25 MED ORDER — OMEPRAZOLE 20 MG PO CPDR: DELAYED_RELEASE_CAPSULE | ORAL | Status: DC

## 2015-12-25 MED ORDER — ALLOPURINOL 100 MG PO TABS: ORAL_TABLET | ORAL | Status: DC

## 2015-12-25 MED ORDER — GABAPENTIN 600 MG PO TABS: 600.0000 mg | ORAL_TABLET | Freq: Three times a day (TID) | ORAL | Status: DC

## 2015-12-29 ENCOUNTER — Ambulatory Visit (HOSPITAL_COMMUNITY)
Admit: 2015-12-29 | Discharge: 2015-12-29 | Disposition: A | Discharge status: Home / Self Care | Payer: Medicare Other | Source: Ambulatory Visit | Attending: Internal Medicine | Admitting: Internal Medicine

## 2015-12-29 VITALS — BP 110/48 | HR 89 | Wt 264.2 lb

## 2015-12-29 DIAGNOSIS — Z79899 Other long term (current) drug therapy: Secondary | ICD-10-CM | POA: Diagnosis not present

## 2015-12-29 DIAGNOSIS — Z955 Presence of coronary angioplasty implant and graft: Secondary | ICD-10-CM | POA: Diagnosis not present

## 2015-12-29 DIAGNOSIS — Z833 Family history of diabetes mellitus: Secondary | ICD-10-CM | POA: Insufficient documentation

## 2015-12-29 DIAGNOSIS — I130 Hypertensive heart and chronic kidney disease with heart failure and stage 1 through stage 4 chronic kidney disease, or unspecified chronic kidney disease: Secondary | ICD-10-CM | POA: Diagnosis not present

## 2015-12-29 DIAGNOSIS — N183 Chronic kidney disease, stage 3 unspecified: Secondary | ICD-10-CM | POA: Insufficient documentation

## 2015-12-29 DIAGNOSIS — I252 Old myocardial infarction: Secondary | ICD-10-CM | POA: Diagnosis not present

## 2015-12-29 DIAGNOSIS — E785 Hyperlipidemia, unspecified: Secondary | ICD-10-CM | POA: Insufficient documentation

## 2015-12-29 DIAGNOSIS — K219 Gastro-esophageal reflux disease without esophagitis: Secondary | ICD-10-CM | POA: Insufficient documentation

## 2015-12-29 DIAGNOSIS — I2510 Atherosclerotic heart disease of native coronary artery without angina pectoris: Secondary | ICD-10-CM | POA: Insufficient documentation

## 2015-12-29 DIAGNOSIS — Z7901 Long term (current) use of anticoagulants: Secondary | ICD-10-CM | POA: Diagnosis not present

## 2015-12-29 DIAGNOSIS — I159 Secondary hypertension, unspecified: Secondary | ICD-10-CM

## 2015-12-29 DIAGNOSIS — I428 Other cardiomyopathies: Secondary | ICD-10-CM | POA: Insufficient documentation

## 2015-12-29 DIAGNOSIS — I429 Cardiomyopathy, unspecified: Secondary | ICD-10-CM

## 2015-12-29 DIAGNOSIS — I5022 Chronic systolic (congestive) heart failure: Secondary | ICD-10-CM | POA: Diagnosis not present

## 2015-12-29 DIAGNOSIS — E1122 Type 2 diabetes mellitus with diabetic chronic kidney disease: Secondary | ICD-10-CM | POA: Diagnosis not present

## 2015-12-29 DIAGNOSIS — G4733 Obstructive sleep apnea (adult) (pediatric): Secondary | ICD-10-CM | POA: Diagnosis not present

## 2015-12-29 DIAGNOSIS — Z8249 Family history of ischemic heart disease and other diseases of the circulatory system: Secondary | ICD-10-CM | POA: Diagnosis not present

## 2015-12-29 DIAGNOSIS — I480 Paroxysmal atrial fibrillation: Secondary | ICD-10-CM | POA: Diagnosis not present

## 2015-12-29 DIAGNOSIS — Z7984 Long term (current) use of oral hypoglycemic drugs: Secondary | ICD-10-CM | POA: Insufficient documentation

## 2015-12-29 DIAGNOSIS — J449 Chronic obstructive pulmonary disease, unspecified: Secondary | ICD-10-CM | POA: Insufficient documentation

## 2015-12-29 LAB — BASIC METABOLIC PANEL
ANION GAP: 14 (ref 5–15)
BUN: 49 mg/dL — AB (ref 6–20)
CHLORIDE: 87 mmol/L — AB (ref 101–111)
CO2: 35 mmol/L — ABNORMAL HIGH (ref 22–32)
Calcium: 9.1 mg/dL (ref 8.9–10.3)
Creatinine, Ser: 1.53 mg/dL — ABNORMAL HIGH (ref 0.61–1.24)
GFR, EST AFRICAN AMERICAN: 47 mL/min — AB (ref >60)
GFR, EST NON AFRICAN AMERICAN: 40 mL/min — AB (ref >60)
Glucose, Bld: 146 mg/dL — ABNORMAL HIGH (ref 65–99)
POTASSIUM: 3.2 mmol/L — AB (ref 3.5–5.1)
SODIUM: 136 mmol/L (ref 135–145)

## 2015-12-29 MED ORDER — CARVEDILOL 6.25 MG PO TABS: 6.2500 mg | ORAL_TABLET | Freq: Two times a day (BID) | ORAL | Status: DC

## 2015-12-29 MED ORDER — FUROSEMIDE 40 MG PO TABS: 60.0000 mg | ORAL_TABLET | Freq: Every evening | ORAL | Status: DC

## 2015-12-29 NOTE — Progress Notes (Signed)
Patient ID: Christopher Holder, male   DOB: 1930-01-07, 82 y.o.   MRN: [id number]  Primary Physician: Mackie Pai, PA-C Primary Cardiologist: Dr Marlou Porch Primary EP: Dr Rayann Heman HF MD: Dr Haroldine Laws  HPI: Christopher Holder is a 82 y.o. male with history of CAD s/p D1 stent (patent on repeat cath 123XX123), chronic systolic HF, ECHO 123XX123 EF 25-30%, HTN, HLD, DM, CKD, OSA with CPAP, Paroxysmal Afib, and COPD.   Admitted late March 2017 with volume ovelrload. Diuresed with IV lasix and transitioned to lasix 80 mg in am and 40 mg in pm. Noted to be in A fib during his hospitalization with controlled rate. Discharge weight was 259 pounds. Discharged to [facility].   Today he returns for post HF follow up. Overall feeling ok. Overall feeling ok. Denies SOB/PND/Orthopnea. Ambulates with a walker. Meds provided by SNF> Currently at at [facility]. His grand daughter will be living with him. He plans to manage his own medications.   Echo 08/21/15 LVEF 25-30%, Severe diffuse HK, Moderate MR, PA peak pressure 41 mm Hg  Nuclear Stress 09/25/15 - Indications, with elevated troponins with acute illness and low EF on echo. Showed EF 36%, High risk study with severe LVE suggestion of TID. Small inferior wall infarct from apex to base.   LHC 10/02/15 with normal coronaries and widely patent D1 stent, no culprit lesions for previous HIGH RISK stress test. Elevated LVEDP and severe LV systolic dysfunction.  ROS: All systems negative except as listed in HPI, PMH and Problem List.  SH:  Social History   Social History  . Marital Status: Widowed    Spouse Name: [name]  . Number of Children: [name]  . Years of Education: [name]   Occupational History  . Not on file.   Social History Main Topics  . Smoking status: Never Smoker   . Smokeless tobacco: Never Used  . Alcohol Use: No  . Drug Use: No  . Sexual Activity: Not on file   Other Topics Concern  . Not on file   Social History Narrative     FH:  Family History  Problem Relation Age of Onset  . Cancer Mother   . Diabetes Father   . Heart attack Brother   . Heart attack Sister     Past Medical History  Diagnosis Date  . Diabetes mellitus without complication ([place])   . CHF (congestive heart failure) ([place])   . Coronary artery disease   . COPD (chronic obstructive pulmonary disease) ([place])   . Asthma   . Hypertension   . HOH (hard of hearing)   . Sleep apnea   . Myocardial infarction ([place])   . Hyperlipidemia 05/27/2015  . GERD (gastroesophageal reflux disease) 05/27/2015  . AKI (acute kidney injury) ([place]) 08/18/2015    Current Outpatient Prescriptions  Medication Sig Dispense Refill  . albuterol (PROVENTIL HFA;VENTOLIN HFA) 108 (90 BASE) MCG/ACT inhaler Inhale 2 puffs into the lungs every 4 (four) hours as needed for wheezing or shortness of breath.     . allopurinol (ZYLOPRIM) 100 MG tablet Take 1 tablet by mouth two  times daily 120 tablet 1  . atorvastatin (LIPITOR) 10 MG tablet Take 10 mg by mouth daily.    . capsaicin (ZOSTRIX) 0.025 % cream Apply topically at bedtime. 60 g 0  . carvedilol (COREG) 3.125 MG tablet Take 1 tablet (3.125 mg total) by mouth 2 (two) times daily with a meal.    . DULERA 200-5 MCG/ACT AERO Use  2 puffs two times daily 26 g 1  . ELIQUIS 5 MG TABS tablet Take 1 tablet (5 mg total) by mouth 2 (two) times daily. (Patient taking differently: Take 2.5 mg by mouth 2 (two) times daily. ) 120 tablet 2  . fluticasone (FLONASE) 50 MCG/ACT nasal spray Use 2 sprays in each  nostril daily 32 g 1  . furosemide (LASIX) 40 MG tablet Take 1 tablet (40 mg total) by mouth every evening. 30 tablet 0  . furosemide (LASIX) 40 MG tablet Take 80 mg by mouth daily. In the AM    . gabapentin (NEURONTIN) 600 MG tablet Take 1 tablet (600 mg total) by mouth 3 (three) times daily. 90 tablet 1  . losartan (COZAAR) 25 MG tablet Take 1 tablet (25 mg total) by mouth daily. 30 tablet 0  . metolazone (ZAROXOLYN) 2.5 MG  tablet Take 2.5 mg by mouth daily.     . NON FORMULARY Take 15 each by mouth Nightly. CPAP at night    . omeprazole (PRILOSEC) 20 MG capsule Take 1 capsule by mouth  daily 60 capsule 1  . potassium chloride SA (K-DUR,KLOR-CON) 20 MEQ tablet Take 2 tablets (40 mEq total) by mouth once. 60 tablet   . predniSONE (DELTASONE) 5 MG tablet Take 2 tablets (10 mg total) by mouth daily with breakfast. 10 tablet 0  . simvastatin (ZOCOR) 20 MG tablet Take 1 tablet by mouth  daily 90 tablet 1  . tiotropium (SPIRIVA) 18 MCG inhalation capsule Place 1 capsule (18 mcg total) into inhaler and inhale daily. 30 capsule 12  . glipiZIDE (GLUCOTROL) 5 MG tablet Take 5 mg by mouth. Reported on 12/29/2015    . montelukast (SINGULAIR) 10 MG tablet Take 1 tablet by mouth at  bedtime (Patient not taking: Reported on 12/29/2015) 90 tablet 0   No current facility-administered medications for this encounter.    Filed Vitals:   12/29/15 1024  BP: 110/48  Pulse: 89  Weight: 264 lb 3.2 oz (119.84 kg)  SpO2: 95%    PHYSICAL EXAM:  General:  Well appearing. No resp difficulty. Ambulated in the clinic in a wheel chair. Granddaughter present.  HEENT: normal Neck: supple. JVP ~10 . Carotids 2+ bilaterally; no bruits. No lymphadenopathy or thryomegaly appreciated. Cor: PMI normal. Irregular rate & rhythm. No rubs, gallops or murmurs. Lungs: clear Abdomen: obese, soft, nontender, nondistended. No hepatosplenomegaly. No bruits or masses. Good bowel sounds. Extremities: no cyanosis, clubbing, rash, edema Neuro: alert & orientedx3, cranial nerves grossly intact. Moves all 4 extremities w/o difficulty. Affect pleasant.   ECG: Afib 76 bpm    ASSESSMENT & PLAN: 1. Chronic Systolic HF- NICM ECHO 123XX123 EF 25-30% . Plan to repeat ECHO after HF meds optimized at least 3 months.  NYHA II-III. Volume status trending up. Reds Vest reading 41. Continue lasix 80 mg in am and increase afternoon dose to 60 mg.  Increase coreg to 6.25  mg twice a day - Continue losartan 25 mg daily. Consider switching to entresto - Check BMET now.  - Plans to discharge from SNF  to home on Friday. Recommend HH for HF. He is aware he needs to weigh and record daily, limit fluid intake to < 2 liters, and follow low salt diet.  2.  Atrial Fibrillation- In A fib today. Rate controlled.  On eliquis. On carvedilol. Can consider DC-CV but seems well compensated with rate control.  3.  CAD- S/P D1 Stent -patent LHC 2017 On eliquis. On statin and BB  4.  HTN- Controlled continue current regimen.  5.  HLD- on statin 6.  CKD stage III- check BMET today.  7. COPD 8. OSA with CPAP- continue nightly  9. CKD Stage III- BMET today   Follow up in 2 weeks for medication titration.   Amy Clegg NP-C  12:39 PM

## 2015-12-29 NOTE — Progress Notes (Signed)
REDS VEST READING= 41 CHEST RULER=13  VEST FITTING TASKS: POSTURE=stand HEIGHT MARKER=tall CENTER STRIP=aligned  COMMENTS:n/a

## 2015-12-29 NOTE — Patient Instructions (Signed)
INCREASE afternoon dose of Lasix to 60 mg INCREASE Coreg to 6.25 mg ,one tab twice a day  Labs today  Your physician recommends that you schedule a follow-up appointment in: 2 weeks  Do the following things EVERYDAY: 1) Weigh yourself in the morning before breakfast. Write it down and keep it in a log. 2) Take your medicines as prescribed 3) Eat low salt foods-Limit salt (sodium) to 2000 mg per day.  4) Stay as active as you can everyday 5) Limit all fluids for the day to less than 2 liters 6)

## 2015-12-30 ENCOUNTER — Other Ambulatory Visit (HOSPITAL_COMMUNITY): Payer: Self-pay | Admitting: *Deleted

## 2015-12-30 MED ORDER — POTASSIUM CHLORIDE CRYS ER 20 MEQ PO TBCR: 40.0000 meq | EXTENDED_RELEASE_TABLET | Freq: Two times a day (BID) | ORAL | Status: DC

## 2015-12-30 NOTE — Telephone Encounter (Signed)
Left message for pt to call back about the glipizide interaction due to pt having the allergy to sulfa antibiotics.

## 2016-01-04 ENCOUNTER — Ambulatory Visit: Payer: Medicare Other | Admitting: Emergency Medicine

## 2016-01-05 ENCOUNTER — Ambulatory Visit (INDEPENDENT_AMBULATORY_CARE_PROVIDER_SITE_OTHER): Payer: Medicare Other | Admitting: Medical

## 2016-01-05 ENCOUNTER — Telehealth: Payer: Self-pay | Admitting: *Deleted

## 2016-01-05 ENCOUNTER — Ambulatory Visit (HOSPITAL_BASED_OUTPATIENT_CLINIC_OR_DEPARTMENT_OTHER)
Admission: RE | Admit: 2016-01-05 | Discharge: 2016-01-05 | Disposition: A | Discharge status: Home / Self Care | Payer: Medicare Other | Source: Ambulatory Visit | Attending: Medical | Admitting: Medical

## 2016-01-05 ENCOUNTER — Telehealth: Payer: Self-pay | Admitting: Medical

## 2016-01-05 ENCOUNTER — Encounter: Payer: Self-pay | Admitting: Medical

## 2016-01-05 VITALS — BP 110/64 | HR 78 | Temp 97.4°F | Ht 72.0 in | Wt 259.8 lb

## 2016-01-05 DIAGNOSIS — E119 Type 2 diabetes mellitus without complications: Secondary | ICD-10-CM

## 2016-01-05 DIAGNOSIS — E876 Hypokalemia: Secondary | ICD-10-CM

## 2016-01-05 DIAGNOSIS — E785 Hyperlipidemia, unspecified: Secondary | ICD-10-CM | POA: Diagnosis not present

## 2016-01-05 DIAGNOSIS — I509 Heart failure, unspecified: Secondary | ICD-10-CM | POA: Insufficient documentation

## 2016-01-05 DIAGNOSIS — I480 Paroxysmal atrial fibrillation: Secondary | ICD-10-CM | POA: Diagnosis not present

## 2016-01-05 DIAGNOSIS — J449 Chronic obstructive pulmonary disease, unspecified: Secondary | ICD-10-CM | POA: Diagnosis not present

## 2016-01-05 DIAGNOSIS — E1140 Type 2 diabetes mellitus with diabetic neuropathy, unspecified: Secondary | ICD-10-CM | POA: Diagnosis not present

## 2016-01-05 DIAGNOSIS — K219 Gastro-esophageal reflux disease without esophagitis: Secondary | ICD-10-CM | POA: Diagnosis not present

## 2016-01-05 DIAGNOSIS — G4733 Obstructive sleep apnea (adult) (pediatric): Secondary | ICD-10-CM | POA: Diagnosis not present

## 2016-01-05 DIAGNOSIS — Z7982 Long term (current) use of aspirin: Secondary | ICD-10-CM | POA: Diagnosis not present

## 2016-01-05 DIAGNOSIS — R262 Difficulty in walking, not elsewhere classified: Secondary | ICD-10-CM | POA: Diagnosis not present

## 2016-01-05 DIAGNOSIS — R05 Cough: Secondary | ICD-10-CM | POA: Diagnosis not present

## 2016-01-05 DIAGNOSIS — I5022 Chronic systolic (congestive) heart failure: Secondary | ICD-10-CM | POA: Diagnosis not present

## 2016-01-05 DIAGNOSIS — M6281 Muscle weakness (generalized): Secondary | ICD-10-CM | POA: Diagnosis not present

## 2016-01-05 DIAGNOSIS — Z7984 Long term (current) use of oral hypoglycemic drugs: Secondary | ICD-10-CM | POA: Diagnosis not present

## 2016-01-05 DIAGNOSIS — J849 Interstitial pulmonary disease, unspecified: Secondary | ICD-10-CM | POA: Insufficient documentation

## 2016-01-05 DIAGNOSIS — N183 Chronic kidney disease, stage 3 (moderate): Secondary | ICD-10-CM | POA: Diagnosis not present

## 2016-01-05 LAB — COMPREHENSIVE METABOLIC PANEL
ALBUMIN: 3.8 g/dL (ref 3.5–5.2)
ALT: 20 U/L (ref 0–53)
AST: 19 U/L (ref 0–37)
Alkaline Phosphatase: 64 U/L (ref 39–117)
BUN: 45 mg/dL — ABNORMAL HIGH (ref 6–23)
CALCIUM: 9 mg/dL (ref 8.4–10.5)
CHLORIDE: 96 meq/L (ref 96–112)
CO2: 37 mEq/L — ABNORMAL HIGH (ref 19–32)
Creatinine, Ser: 1.47 mg/dL (ref 0.40–1.50)
GFR: 48.56 mL/min — ABNORMAL LOW (ref >60.00)
Glucose, Bld: 99 mg/dL (ref 70–99)
POTASSIUM: 2.9 meq/L — AB (ref 3.5–5.1)
Sodium: 140 mEq/L (ref 135–145)
Total Bilirubin: 0.5 mg/dL (ref 0.2–1.2)
Total Protein: 6.8 g/dL (ref 6.0–8.3)

## 2016-01-05 LAB — HEMOGLOBIN A1C: HEMOGLOBIN A1C: 7 % — AB (ref 4.6–6.5)

## 2016-01-05 LAB — BRAIN NATRIURETIC PEPTIDE: Pro B Natriuretic peptide (BNP): 538 pg/mL — ABNORMAL HIGH (ref 0.0–100.0)

## 2016-01-05 MED ORDER — SIMVASTATIN 20 MG PO TABS: ORAL_TABLET | ORAL | Status: DC

## 2016-01-05 MED ORDER — SIMVASTATIN 20 MG PO TABS
ORAL_TABLET | ORAL | Status: DC
Start: 2016-01-05 — End: 2016-02-26

## 2016-01-05 MED FILL — FUROSEMIDE 40 MG TABLET: 40 | 30 days supply | Qty: 105 | Fill #0

## 2016-01-05 NOTE — Patient Instructions (Addendum)
For your chf and recent hospitalization. Please get labs and chest xray. Labs will include k level.  For diabetes continue current meds and will get a1c today.  I want you to contact chf clinic and advise them on your excess urination at night. They may alter dosing pattern. Your grand daughter will help facilitate that conversation with cardiologist.  Follow up 2 weeks or as needed

## 2016-01-05 NOTE — Progress Notes (Signed)
Pre visit review using our clinic review tool, if applicable. No additional management support is needed unless otherwise documented below in the visit note. 

## 2016-01-05 NOTE — Progress Notes (Signed)
Subjective:    Patient ID: Christopher Holder, male    DOB: [birth year], 82 y.o.   MRN: [id number]  HPI  Pt had chf, post inflammatory fibrosis and copd flair. Treated in hospital and then [facility] to in patient PT. Got home yesterday from inpatient PT.  Pt in for follow up. Pt is very energetic today. Feels good. No sob. He is walking with walker very fast with  brisk pace down hall before I saw him for visit. Pt weight is 259 today.   Pt was instructed by heart failure center to use lasix 80 mg in am and 60 mg in pm.  Pt has tried this regimen when he is was inpatient PT and he did urinate a lot at night.  Pt states he is no longer taking gabapentin any more. He is worried about his kidney function after he was explained to be cautious by provider in hospital.  Pt last labs 7 days ago showed low k. Pt is on k tabs twice a day. Some decreased kidney function.   Review of Systems  Constitutional: Negative for chills and fatigue.  Respiratory: Negative for cough, chest tightness, shortness of breath and wheezing.   Cardiovascular: Negative for chest pain and palpitations.  Gastrointestinal: Negative for abdominal pain and constipation.  Musculoskeletal: Negative for back pain.  Skin: Negative for rash.  Neurological: Negative for dizziness, seizures and headaches.  Hematological: Negative for adenopathy. Does not bruise/bleed easily.  Psychiatric/Behavioral: Negative for behavioral problems and confusion.   Past Medical History  Diagnosis Date  . Diabetes mellitus without complication ([place])   . CHF (congestive heart failure) ([place])   . Coronary artery disease   . COPD (chronic obstructive pulmonary disease) ([place])   . Asthma   . Hypertension   . HOH (hard of hearing)   . Sleep apnea   . Myocardial infarction ([place])   . Hyperlipidemia 05/27/2015  . GERD (gastroesophageal reflux disease) 05/27/2015  . AKI (acute kidney injury) ([place]) 08/18/2015     Social History   Social History  .  Marital Status: Widowed    Spouse Name: [name]  . Number of Children: [name]  . Years of Education: [name]   Occupational History  . Not on file.   Social History Main Topics  . Smoking status: Never Smoker   . Smokeless tobacco: Never Used  . Alcohol Use: No  . Drug Use: No  . Sexual Activity: Not on file   Other Topics Concern  . Not on file   Social History Narrative    Past Surgical History  Procedure Laterality Date  . Coronary angioplasty with stent placement    . Knee surgery    . Knee surgery      Knee replacement x2  . Cardioversion [name] 08/28/2015    Procedure: CARDIOVERSION;  Surgeon: Jerline Pain, MD;  Location: [facility];  Service: Cardiovascular;  Laterality: [name];  . Cardiac catheterization [name] 10/02/2015    Procedure: Left Heart Cath and Coronary Angiography;  Surgeon: Leonie Man, MD;  Location: [facility];  Service: Cardiovascular;  Laterality: [name];    Family History  Problem Relation Age of Onset  . Cancer Mother   . Diabetes Father   . Heart attack Brother   . Heart attack Sister     Allergies  Allergen Reactions  . Percocet [Oxycodone-Acetaminophen] Diarrhea, Nausea And Vomiting and Nausea Only  . Sulfa Antibiotics Diarrhea and Nausea And Vomiting    Current Outpatient Prescriptions on File  Prior to Visit  Medication Sig Dispense Refill  . albuterol (PROVENTIL HFA;VENTOLIN HFA) 108 (90 BASE) MCG/ACT inhaler Inhale 2 puffs into the lungs every 4 (four) hours as needed for wheezing or shortness of breath.     . allopurinol (ZYLOPRIM) 100 MG tablet Take 1 tablet by mouth two  times daily 120 tablet 1  . atorvastatin (LIPITOR) 10 MG tablet Take 10 mg by mouth daily.    . carvedilol (COREG) 6.25 MG tablet Take 1 tablet (6.25 mg total) by mouth 2 (two) times daily with a meal.    . DULERA 200-5 MCG/ACT AERO Use 2 puffs two times daily 26 g 1  . ELIQUIS 5 MG TABS tablet Take 1 tablet (5 mg total) by mouth 2 (two) times daily. 120 tablet 2  .  fluticasone (FLONASE) 50 MCG/ACT nasal spray Use 2 sprays in each  nostril daily 32 g 1  . furosemide (LASIX) 40 MG tablet Take 80 mg by mouth daily. In the AM    . furosemide (LASIX) 40 MG tablet Take 1.5 tablets (60 mg total) by mouth every evening. 30 tablet 0  . gabapentin (NEURONTIN) 600 MG tablet Take 1 tablet (600 mg total) by mouth 3 (three) times daily. 90 tablet 1  . glipiZIDE (GLUCOTROL) 5 MG tablet Take 5 mg by mouth. Reported on 12/29/2015    . losartan (COZAAR) 25 MG tablet Take 1 tablet (25 mg total) by mouth daily. 30 tablet 0  . metolazone (ZAROXOLYN) 2.5 MG tablet Take 2.5 mg by mouth daily.     . montelukast (SINGULAIR) 10 MG tablet Take 1 tablet by mouth at  bedtime 90 tablet 0  . NON FORMULARY Take 15 each by mouth Nightly. CPAP at night    . omeprazole (PRILOSEC) 20 MG capsule Take 1 capsule by mouth  daily 60 capsule 1  . potassium chloride SA (K-DUR,KLOR-CON) 20 MEQ tablet Take 2 tablets (40 mEq total) by mouth 2 (two) times daily. 120 tablet   . predniSONE (DELTASONE) 5 MG tablet Take 2 tablets (10 mg total) by mouth daily with breakfast. 10 tablet 0  . simvastatin (ZOCOR) 20 MG tablet Take 1 tablet by mouth  daily 90 tablet 1  . tiotropium (SPIRIVA) 18 MCG inhalation capsule Place 1 capsule (18 mcg total) into inhaler and inhale daily. 30 capsule 12   No current facility-administered medications on file prior to visit.    BP 110/64 mmHg  Pulse 78  Temp(Src) 97.4 F (36.3 C) (Oral)  Ht 6' (1.829 m)  Wt 259 lb 12.8 oz (117.845 kg)  BMI 35.23 kg/m2  SpO2 98%       Objective:   Physical Exam  General Mental Status- Alert. General Appearance- Not in acute distress.   Skin General: Color- Normal Color. Moisture- Normal Moisture.  Neck Carotid Arteries- Normal color. Moisture- Normal Moisture. No carotid bruits. No JVD.  Chest and Lung Exam Auscultation: Breath Sounds:-Normal.  Cardiovascular Auscultation:Rythm- Regular. Murmurs & Other Heart  Sounds:Auscultation of the heart reveals- No Murmurs.  Abdomen Inspection:-Inspeection Normal. Palpation/Percussion:Note:No mass. Palpation and Percussion of the abdomen reveal- Non Tender, Non Distended + BS, no rebound or guarding.    Neurologic Cranial Nerve exam:- CN III-XII intact(No nystagmus), symmetric smile. Strength:- 5/5 equal and symmetric strength both upper and lower extremities.  Lower ext- no pedal edema. Negative homans signs.     Assessment & Plan:  For your chf and recent hospitalization. Please get labs and chest xray. Labs will include k level.  For diabetes continue current meds and will get a1c today.  I want you to contact chf clinic and advise them on your excess urination at night. They may alter dosing pattern. Your grand daughter will help facilitate that conversation with cardiologist.   Follow up 2 weeks or as needed  Discussed pt with Dr. Etter Sjogren. Orders signed today for PT and OT. I advised family if they want will sign home health orders as well in light of his history of recurrent hospitalizations.

## 2016-01-05 NOTE — Telephone Encounter (Signed)
Caller name: [name] [facility] Relationship to patient: Can be reached: [phone]  Pharmacy:  Reason for call: Request verbal orders for PT twice per week x 2 weeks and the 1 time per week for last week. For transfers, falling safety, exercise. Also requesting [facility] nursing for medication teaching

## 2016-01-05 NOTE — Telephone Encounter (Signed)
Received Physician Interim Order [copy] stating that "order not signed. Please have MD sign & date. Thanks."; forwarded to provider/SLS 04/18

## 2016-01-05 NOTE — Telephone Encounter (Signed)
Please advise 

## 2016-01-05 NOTE — Telephone Encounter (Signed)
Ok to give verbal 

## 2016-01-05 NOTE — Addendum Note (Signed)
Addended by: Tasia Catchings on: 01/05/2016 01:38 PM   Modules accepted: Orders, Medications

## 2016-01-06 NOTE — Telephone Encounter (Signed)
Spoke with Anderson Malta at [facility] and gave her the Verbal Order from Dr. Carollee Herter on the PT and the home health nursing. Anderson Malta will send over a paper copy for the doctor to sign.

## 2016-01-07 ENCOUNTER — Telehealth: Payer: Self-pay | Admitting: Medical

## 2016-01-07 DIAGNOSIS — N183 Chronic kidney disease, stage 3 (moderate): Secondary | ICD-10-CM | POA: Diagnosis not present

## 2016-01-07 DIAGNOSIS — E785 Hyperlipidemia, unspecified: Secondary | ICD-10-CM | POA: Diagnosis not present

## 2016-01-07 DIAGNOSIS — M6281 Muscle weakness (generalized): Secondary | ICD-10-CM | POA: Diagnosis not present

## 2016-01-07 DIAGNOSIS — Z7982 Long term (current) use of aspirin: Secondary | ICD-10-CM | POA: Diagnosis not present

## 2016-01-07 DIAGNOSIS — E1140 Type 2 diabetes mellitus with diabetic neuropathy, unspecified: Secondary | ICD-10-CM | POA: Diagnosis not present

## 2016-01-07 DIAGNOSIS — I5022 Chronic systolic (congestive) heart failure: Secondary | ICD-10-CM | POA: Diagnosis not present

## 2016-01-07 DIAGNOSIS — Z7984 Long term (current) use of oral hypoglycemic drugs: Secondary | ICD-10-CM | POA: Diagnosis not present

## 2016-01-07 DIAGNOSIS — R262 Difficulty in walking, not elsewhere classified: Secondary | ICD-10-CM | POA: Diagnosis not present

## 2016-01-07 DIAGNOSIS — G4733 Obstructive sleep apnea (adult) (pediatric): Secondary | ICD-10-CM | POA: Diagnosis not present

## 2016-01-07 DIAGNOSIS — K219 Gastro-esophageal reflux disease without esophagitis: Secondary | ICD-10-CM | POA: Diagnosis not present

## 2016-01-07 DIAGNOSIS — J449 Chronic obstructive pulmonary disease, unspecified: Secondary | ICD-10-CM | POA: Diagnosis not present

## 2016-01-07 DIAGNOSIS — I480 Paroxysmal atrial fibrillation: Secondary | ICD-10-CM | POA: Diagnosis not present

## 2016-01-07 NOTE — Telephone Encounter (Signed)
Caller name: Olin Hauser from [facility]  Relation to pt: RN  Call back number:[phone]   Reason for call:  Requesting verbal orders 1x 1 2x 2 for home health health aid to assist with bath in addition to speech therapy. Please advise   Verbal orders for sppec

## 2016-01-07 NOTE — Telephone Encounter (Signed)
Please advise  HM

## 2016-01-07 NOTE — Telephone Encounter (Signed)
Spoke with granddaughter and she states that the pt is becoming more aggressive with the home care staff and his family as well. She also states that the patient is having trouble sleeping at night as well. Granddaughter wanted Christopher Holder to know so it could be addressed at the next visit.

## 2016-01-07 NOTE — Telephone Encounter (Signed)
Spoke with pamela and gave him the verbal orders. Pam advised that she would like Edward or Dr. Etter Sjogren would be able to get the patient in with a nursing home or an assisted living. I advised pam that I would make the provider aware.

## 2016-01-07 NOTE — Telephone Encounter (Signed)
Ok to give verbal 

## 2016-01-08 DIAGNOSIS — Z7984 Long term (current) use of oral hypoglycemic drugs: Secondary | ICD-10-CM | POA: Diagnosis not present

## 2016-01-08 DIAGNOSIS — E785 Hyperlipidemia, unspecified: Secondary | ICD-10-CM | POA: Diagnosis not present

## 2016-01-08 DIAGNOSIS — I5022 Chronic systolic (congestive) heart failure: Secondary | ICD-10-CM | POA: Diagnosis not present

## 2016-01-08 DIAGNOSIS — M6281 Muscle weakness (generalized): Secondary | ICD-10-CM | POA: Diagnosis not present

## 2016-01-08 DIAGNOSIS — R262 Difficulty in walking, not elsewhere classified: Secondary | ICD-10-CM | POA: Diagnosis not present

## 2016-01-08 DIAGNOSIS — N183 Chronic kidney disease, stage 3 (moderate): Secondary | ICD-10-CM | POA: Diagnosis not present

## 2016-01-08 DIAGNOSIS — K219 Gastro-esophageal reflux disease without esophagitis: Secondary | ICD-10-CM | POA: Diagnosis not present

## 2016-01-08 DIAGNOSIS — I480 Paroxysmal atrial fibrillation: Secondary | ICD-10-CM | POA: Diagnosis not present

## 2016-01-08 DIAGNOSIS — Z7982 Long term (current) use of aspirin: Secondary | ICD-10-CM | POA: Diagnosis not present

## 2016-01-08 DIAGNOSIS — G4733 Obstructive sleep apnea (adult) (pediatric): Secondary | ICD-10-CM | POA: Diagnosis not present

## 2016-01-08 DIAGNOSIS — J449 Chronic obstructive pulmonary disease, unspecified: Secondary | ICD-10-CM | POA: Diagnosis not present

## 2016-01-08 DIAGNOSIS — E1140 Type 2 diabetes mellitus with diabetic neuropathy, unspecified: Secondary | ICD-10-CM | POA: Diagnosis not present

## 2016-01-09 NOTE — Telephone Encounter (Signed)
If I remember correctly he had evaluation for that. Then it was determined he was stable and did not need. So verbal  order to restart process.

## 2016-01-09 NOTE — Telephone Encounter (Signed)
Will discuss his behavior on follow up but go ahead and get process started on nursing home or assisted living.

## 2016-01-11 ENCOUNTER — Other Ambulatory Visit: Payer: Self-pay

## 2016-01-11 ENCOUNTER — Ambulatory Visit: Payer: Medicare Other | Admitting: Cardiology

## 2016-01-11 DIAGNOSIS — N183 Chronic kidney disease, stage 3 (moderate): Secondary | ICD-10-CM | POA: Diagnosis not present

## 2016-01-11 DIAGNOSIS — E785 Hyperlipidemia, unspecified: Secondary | ICD-10-CM | POA: Diagnosis not present

## 2016-01-11 DIAGNOSIS — I480 Paroxysmal atrial fibrillation: Secondary | ICD-10-CM | POA: Diagnosis not present

## 2016-01-11 DIAGNOSIS — K219 Gastro-esophageal reflux disease without esophagitis: Secondary | ICD-10-CM | POA: Diagnosis not present

## 2016-01-11 DIAGNOSIS — I5022 Chronic systolic (congestive) heart failure: Secondary | ICD-10-CM | POA: Diagnosis not present

## 2016-01-11 DIAGNOSIS — R262 Difficulty in walking, not elsewhere classified: Secondary | ICD-10-CM | POA: Diagnosis not present

## 2016-01-11 DIAGNOSIS — Z7984 Long term (current) use of oral hypoglycemic drugs: Secondary | ICD-10-CM | POA: Diagnosis not present

## 2016-01-11 DIAGNOSIS — E1140 Type 2 diabetes mellitus with diabetic neuropathy, unspecified: Secondary | ICD-10-CM | POA: Diagnosis not present

## 2016-01-11 DIAGNOSIS — G4733 Obstructive sleep apnea (adult) (pediatric): Secondary | ICD-10-CM | POA: Diagnosis not present

## 2016-01-11 DIAGNOSIS — Z7982 Long term (current) use of aspirin: Secondary | ICD-10-CM | POA: Diagnosis not present

## 2016-01-11 DIAGNOSIS — M6281 Muscle weakness (generalized): Secondary | ICD-10-CM | POA: Diagnosis not present

## 2016-01-11 DIAGNOSIS — J449 Chronic obstructive pulmonary disease, unspecified: Secondary | ICD-10-CM | POA: Diagnosis not present

## 2016-01-11 NOTE — Patient Outreach (Signed)
[facility]) Care Management  01/11/2016  Branford Alvira [birth year] [id number]  Telephone call to patient regarding EMMI heart failure red referral. Unable to reach patient or leave voicemail message.  Phone continued to ring.   PLAN:  RNCM will attempt 2nd telephone outreach to patient within 1 week.  Quinn Plowman RN,BSN,CCM [facility] Telephonic  [phone]

## 2016-01-12 ENCOUNTER — Other Ambulatory Visit: Payer: Self-pay

## 2016-01-12 DIAGNOSIS — E1165 Type 2 diabetes mellitus with hyperglycemia: Secondary | ICD-10-CM | POA: Diagnosis not present

## 2016-01-12 DIAGNOSIS — I480 Paroxysmal atrial fibrillation: Secondary | ICD-10-CM | POA: Diagnosis not present

## 2016-01-12 DIAGNOSIS — R262 Difficulty in walking, not elsewhere classified: Secondary | ICD-10-CM | POA: Diagnosis not present

## 2016-01-12 DIAGNOSIS — J449 Chronic obstructive pulmonary disease, unspecified: Secondary | ICD-10-CM | POA: Diagnosis not present

## 2016-01-12 DIAGNOSIS — E1140 Type 2 diabetes mellitus with diabetic neuropathy, unspecified: Secondary | ICD-10-CM | POA: Diagnosis not present

## 2016-01-12 DIAGNOSIS — N183 Chronic kidney disease, stage 3 (moderate): Secondary | ICD-10-CM | POA: Diagnosis not present

## 2016-01-12 DIAGNOSIS — M6281 Muscle weakness (generalized): Secondary | ICD-10-CM | POA: Diagnosis not present

## 2016-01-12 DIAGNOSIS — I5022 Chronic systolic (congestive) heart failure: Secondary | ICD-10-CM

## 2016-01-12 DIAGNOSIS — G4733 Obstructive sleep apnea (adult) (pediatric): Secondary | ICD-10-CM | POA: Diagnosis not present

## 2016-01-12 DIAGNOSIS — K219 Gastro-esophageal reflux disease without esophagitis: Secondary | ICD-10-CM | POA: Diagnosis not present

## 2016-01-12 DIAGNOSIS — Z7984 Long term (current) use of oral hypoglycemic drugs: Secondary | ICD-10-CM | POA: Diagnosis not present

## 2016-01-12 DIAGNOSIS — E785 Hyperlipidemia, unspecified: Secondary | ICD-10-CM | POA: Diagnosis not present

## 2016-01-12 DIAGNOSIS — Z7982 Long term (current) use of aspirin: Secondary | ICD-10-CM | POA: Diagnosis not present

## 2016-01-12 NOTE — Patient Outreach (Signed)
[facility]) Care Management  01/12/2016  Christopher Holder 05-27-1930 [id number]  SUBJECTIVE:  Telephone call to patient regarding EMMI heart failure RED referral.  HIPAA verified with patient. Patient states he does not like talking to the automated calls because he is not able to answer the questions correctly sometimes. Patient denies having any new problems or concerns. Patient states he is hard of hearing. Patient reports he has a caption phone.  Patient states he followed up with his primary MD on 01/05/16 since he was discharged from the hospital. Patient states he is weighing himself every morning and writing it down. Patient states his weight today was 255.1 lbs. Patient reports his weight on yesterday was 253 lbs. Patient denies any swelling or cough.  Patient states he has his normal shortness of breath with activity.  Patient states this is not new for him.  Patient states he has been having these symptoms since the beginning of 2017.  Medications:  Patient states the only thing that changed for him at his doctor appointment is he is to take lasix 80mg  in the morning and 60mg  in the evening.  Patient states he is able to afford his medications. Patient states he is capable of setting up his own pill box weekly. Patient states he does have difficulty with knowing the names of some of the medicines and what they are for.  Patient states he fell in November 2016.  Patient states was in the hospital in December with pneumonia. Patient states he has had a heart attack in the past.  Patient states, " I have really bad neuropathy and gout." Patient states he uses a cane to walk in the house and a walker for outside use." Patient states he does not take the gabapentin anymore due to changes in his kidneys.  Patient states he currently has [facility] providing nursing services, physical therapy and a home health aid.  Patient states his granddaughter lives with him and helps him go  to doctor appointments and his care.  RNCM reviewed with patient signs of heart failure. RNCM advised patient to contact his doctor due to increased heart failure symptoms that do not resolve or lessen with his normal treatment.  RNCM advised patient to continue to weigh daily and record.   RNCM offered [facility] care management services to patient. Patient verbally agreed to services. Patient made aware by this RNCM that his services with [facility] would continue.  Patient made aware that [facility] care management work in coordination with outside services. Patient verbalized understanding.   ASSESSMENT:  EMMI heart failure referral  PLAN:  RNCM will refer patient to community case manager.  Quinn Plowman RN,BSN,CCM [facility] Telephonic  [phone]

## 2016-01-13 ENCOUNTER — Ambulatory Visit (HOSPITAL_COMMUNITY)
Admission: RE | Admit: 2016-01-13 | Discharge: 2016-01-13 | Disposition: A | Discharge status: Home / Self Care | Payer: Medicare Other | Source: Ambulatory Visit | Attending: Internal Medicine | Admitting: Internal Medicine

## 2016-01-13 VITALS — BP 102/56 | HR 92 | Wt 263.2 lb

## 2016-01-13 DIAGNOSIS — Z833 Family history of diabetes mellitus: Secondary | ICD-10-CM | POA: Insufficient documentation

## 2016-01-13 DIAGNOSIS — N183 Chronic kidney disease, stage 3 unspecified: Secondary | ICD-10-CM

## 2016-01-13 DIAGNOSIS — I428 Other cardiomyopathies: Secondary | ICD-10-CM | POA: Diagnosis not present

## 2016-01-13 DIAGNOSIS — I130 Hypertensive heart and chronic kidney disease with heart failure and stage 1 through stage 4 chronic kidney disease, or unspecified chronic kidney disease: Secondary | ICD-10-CM | POA: Diagnosis not present

## 2016-01-13 DIAGNOSIS — J449 Chronic obstructive pulmonary disease, unspecified: Secondary | ICD-10-CM | POA: Insufficient documentation

## 2016-01-13 DIAGNOSIS — I5022 Chronic systolic (congestive) heart failure: Secondary | ICD-10-CM | POA: Insufficient documentation

## 2016-01-13 DIAGNOSIS — E785 Hyperlipidemia, unspecified: Secondary | ICD-10-CM | POA: Insufficient documentation

## 2016-01-13 DIAGNOSIS — G4733 Obstructive sleep apnea (adult) (pediatric): Secondary | ICD-10-CM | POA: Diagnosis not present

## 2016-01-13 DIAGNOSIS — I252 Old myocardial infarction: Secondary | ICD-10-CM | POA: Insufficient documentation

## 2016-01-13 DIAGNOSIS — I159 Secondary hypertension, unspecified: Secondary | ICD-10-CM

## 2016-01-13 DIAGNOSIS — Z7901 Long term (current) use of anticoagulants: Secondary | ICD-10-CM | POA: Diagnosis not present

## 2016-01-13 DIAGNOSIS — K219 Gastro-esophageal reflux disease without esophagitis: Secondary | ICD-10-CM | POA: Diagnosis not present

## 2016-01-13 DIAGNOSIS — I2510 Atherosclerotic heart disease of native coronary artery without angina pectoris: Secondary | ICD-10-CM | POA: Diagnosis not present

## 2016-01-13 DIAGNOSIS — I4891 Unspecified atrial fibrillation: Secondary | ICD-10-CM | POA: Insufficient documentation

## 2016-01-13 DIAGNOSIS — E669 Obesity, unspecified: Secondary | ICD-10-CM | POA: Diagnosis not present

## 2016-01-13 DIAGNOSIS — Z955 Presence of coronary angioplasty implant and graft: Secondary | ICD-10-CM | POA: Diagnosis not present

## 2016-01-13 DIAGNOSIS — Z79899 Other long term (current) drug therapy: Secondary | ICD-10-CM | POA: Diagnosis not present

## 2016-01-13 DIAGNOSIS — Z8249 Family history of ischemic heart disease and other diseases of the circulatory system: Secondary | ICD-10-CM | POA: Diagnosis not present

## 2016-01-13 LAB — BASIC METABOLIC PANEL
ANION GAP: 12 (ref 5–15)
BUN: 27 mg/dL — ABNORMAL HIGH (ref 6–20)
CALCIUM: 8.6 mg/dL — AB (ref 8.9–10.3)
CO2: 31 mmol/L (ref 22–32)
Chloride: 97 mmol/L — ABNORMAL LOW (ref 101–111)
Creatinine, Ser: 1.59 mg/dL — ABNORMAL HIGH (ref 0.61–1.24)
GFR calc Af Amer: 45 mL/min — ABNORMAL LOW (ref >60)
GFR calc non Af Amer: 38 mL/min — ABNORMAL LOW (ref >60)
GLUCOSE: 109 mg/dL — AB (ref 65–99)
POTASSIUM: 3.5 mmol/L (ref 3.5–5.1)
Sodium: 140 mmol/L (ref 135–145)

## 2016-01-13 MED ORDER — LOSARTAN POTASSIUM 25 MG PO TABS: 12.5000 mg | ORAL_TABLET | Freq: Every day | ORAL | Status: DC

## 2016-01-13 MED FILL — LOSARTAN POTASSIUM 25 MG TA: 25 | 60 days supply | Qty: 30 | Fill #0

## 2016-01-13 NOTE — Progress Notes (Signed)
Advanced Heart Failure Medication Review by a Pharmacist  Does the patient  feel that his/her medications are working for him/her?  yes  Has the patient been experiencing any side effects to the medications prescribed?  no  Does the patient measure his/her own blood pressure or blood glucose at home?  yes   Does the patient have any problems obtaining medications due to transportation or finances?   no  Understanding of regimen: good Understanding of indications: good Potential of compliance: good Patient understands to avoid NSAIDs. Patient understands to avoid decongestants.  Issues to address at subsequent visits: none   Pharmacist comments: Christopher Holder is a 82 yo man here for HF clinic f/u, does not miss doses of his medications and knows his medications well. Denies any lightheadedness, dizziness, or myalgias. Of note, has not been taking losartan and was unaware that it was prescribed. No issues or concerns for me at this time.  Heloise Ochoa, [place].D., BCPS PGY2 Cardiology Pharmacy Resident Pager: [phone]    Time with patient: 5 min Preparation and documentation time: 5 min Total time: 10 min

## 2016-01-13 NOTE — Progress Notes (Signed)
Patient ID: Christopher Holder, male   DOB: [birth year], 82 y.o.   MRN: [id number]  Primary Physician: Mackie Pai, PA-C Primary Cardiologist: Dr Marlou Porch Primary EP: Dr Rayann Heman HF MD: Dr Haroldine Laws  HPI: Christopher Holder is a 82 y.o. male with history of CAD s/p D1 stent (patent on repeat cath 123XX123), chronic systolic HF, ECHO 123XX123 EF 25-30%, HTN, HLD, DM, CKD, OSA with CPAP, Paroxysmal Afib, and COPD.   Admitted late March 2017 with volume ovelrload. Diuresed with IV lasix and transitioned to lasix 80 mg in am and 40 mg in pm. Noted to be in A fib during his hospitalization with controlled rate. Discharge weight was 259 pounds. Discharged to [facility].   Today he returns for HF follow up. Overall feeling ok.  Denies SOB/PND/Orthopnea. Ambulated in the clinic with a walker. Weight at home 250-255 pounds. His grand daughter helps with meals. He is managing his own medications. [facility] following for [facility] and HHPT.   Echo 08/21/15 LVEF 25-30%, Severe diffuse HK, Moderate MR, PA peak pressure 41 mm Hg  Nuclear Stress 09/25/15 - Indications, with elevated troponins with acute illness and low EF on echo. Showed EF 36%, High risk study with severe LVE suggestion of TID. Small inferior wall infarct from apex to base.   LHC 10/02/15 with normal coronaries and widely patent D1 stent, no culprit lesions for previous HIGH RISK stress test. Elevated LVEDP and severe LV systolic dysfunction.  ROS: All systems negative except as listed in HPI, PMH and Problem List.  SH:  Social History   Social History  . Marital Status: Widowed    Spouse Name: [name]  . Number of Children: [name]  . Years of Education: [name]   Occupational History  . Not on file.   Social History Main Topics  . Smoking status: Never Smoker   . Smokeless tobacco: Never Used  . Alcohol Use: No  . Drug Use: No  . Sexual Activity: Not on file   Other Topics Concern  . Not on file   Social History Narrative    FH:  Family  History  Problem Relation Age of Onset  . Cancer Mother   . Diabetes Father   . Heart attack Brother   . Heart attack Sister     Past Medical History  Diagnosis Date  . Diabetes mellitus without complication ([place])   . CHF (congestive heart failure) ([place])   . Coronary artery disease   . COPD (chronic obstructive pulmonary disease) ([place])   . Asthma   . Hypertension   . HOH (hard of hearing)   . Sleep apnea   . Myocardial infarction ([place])   . Hyperlipidemia 05/27/2015  . GERD (gastroesophageal reflux disease) 05/27/2015  . AKI (acute kidney injury) ([place]) 08/18/2015    Current Outpatient Prescriptions  Medication Sig Dispense Refill  . furosemide (LASIX) 40 MG tablet Take 80 mg by mouth daily. In the AM    . furosemide (LASIX) 40 MG tablet Take 1.5 tablets (60 mg total) by mouth every evening. 30 tablet 0  . albuterol (PROVENTIL HFA;VENTOLIN HFA) 108 (90 BASE) MCG/ACT inhaler Inhale 2 puffs into the lungs every 4 (four) hours as needed for wheezing or shortness of breath.     . allopurinol (ZYLOPRIM) 100 MG tablet Take 1 tablet by mouth two  times daily 120 tablet 1  . carvedilol (COREG) 6.25 MG tablet Take 1 tablet (6.25 mg total) by mouth 2 (two) times daily with a meal.    .  DULERA 200-5 MCG/ACT AERO Use 2 puffs two times daily 26 g 1  . ELIQUIS 5 MG TABS tablet Take 1 tablet (5 mg total) by mouth 2 (two) times daily. 120 tablet 2  . fluticasone (FLONASE) 50 MCG/ACT nasal spray Use 2 sprays in each  nostril daily 32 g 1  . glipiZIDE (GLUCOTROL) 5 MG tablet Take 5 mg by mouth. Reported on 12/29/2015    . montelukast (SINGULAIR) 10 MG tablet Take 1 tablet by mouth at  bedtime 90 tablet 0  . NON FORMULARY Take 15 each by mouth Nightly. CPAP at night    . omeprazole (PRILOSEC) 20 MG capsule Take 1 capsule by mouth  daily 60 capsule 1  . potassium chloride SA (K-DUR,KLOR-CON) 20 MEQ tablet Take 2 tablets (40 mEq total) by mouth 2 (two) times daily. 120 tablet   . simvastatin (ZOCOR) 20 MG  tablet Take 1 tablet by mouth  daily 90 tablet 1  . tiotropium (SPIRIVA) 18 MCG inhalation capsule Place 1 capsule (18 mcg total) into inhaler and inhale daily. 30 capsule 12   No current facility-administered medications for this encounter.    Filed Vitals:   01/13/16 1042  BP: 102/56  Pulse: 108  Weight: 263 lb 3.2 oz (119.387 kg)  SpO2: 94%    PHYSICAL EXAM: General:  Well appearing. No resp difficulty. Ambulated in the clinic in a wheel chair. Granddaughter present.  HEENT: normal Neck: supple. JVP 6-7 . Carotids 2+ bilaterally; no bruits. No lymphadenopathy or thryomegaly appreciated. Cor: PMI normal. Irregular rate & rhythm. No rubs, gallops or murmurs. Lungs: clear Abdomen: obese, soft, nontender, nondistended. No hepatosplenomegaly. No bruits or masses. Good bowel sounds. Extremities: no cyanosis, clubbing, rash, edema Neuro: alert & orientedx3, cranial nerves grossly intact. Moves all 4 extremities w/o difficulty. Affect pleasant.  ASSESSMENT & PLAN: 1. Chronic Systolic HF- NICM ECHO 123XX123 EF 25-30% . Plan to repeat ECHO after HF meds optimized at least 3 months. Likely July.  NYHA II-III. Volume status stable.  Continue lasix 80 mg in am and 60 mg in the afternoon + 20 meq K daily.  - Continue coreg to 6.25 mg twice a day - Add 12.5 mg losartan daily.  Hold off on entresto for now with BP.  - Consider spiro next visit.    BMET today.  2.  Atrial Fibrillation- In A fib today. Rate controlled.  On eliquis. On carvedilol. Can consider DC-CV but seems well compensated with rate control.  3.  CAD- S/P D1 Stent -patent LHC 2017 On eliquis. On statin and BB 4.  HTN- Controlled continue current regimen.  5.  HLD- on statin 6.  CKD stage III- check BMET today.  7. COPD 8. OSA with CPAP- continue nightly  9. CKD Stage III- BMET today   Follow up in 4 weeks for medication titration.   Toshiyuki Fredell NP-C  10:56 AM

## 2016-01-13 NOTE — Patient Instructions (Signed)
Follow up in 4 weeks   Take losartan 12.5 mg daily.   Do the following things EVERYDAY: 1) Weigh yourself in the morning before breakfast. Write it down and keep it in a log. 2) Take your medicines as prescribed 3) Eat low salt foods-Limit salt (sodium) to 2000 mg per day.  4) Stay as active as you can everyday 5) Limit all fluids for the day to less than 2 liters

## 2016-01-14 ENCOUNTER — Other Ambulatory Visit: Payer: Self-pay | Admitting: *Deleted

## 2016-01-14 ENCOUNTER — Telehealth: Payer: Self-pay

## 2016-01-14 DIAGNOSIS — E1140 Type 2 diabetes mellitus with diabetic neuropathy, unspecified: Secondary | ICD-10-CM | POA: Diagnosis not present

## 2016-01-14 DIAGNOSIS — J449 Chronic obstructive pulmonary disease, unspecified: Secondary | ICD-10-CM | POA: Diagnosis not present

## 2016-01-14 DIAGNOSIS — G4733 Obstructive sleep apnea (adult) (pediatric): Secondary | ICD-10-CM | POA: Diagnosis not present

## 2016-01-14 DIAGNOSIS — R262 Difficulty in walking, not elsewhere classified: Secondary | ICD-10-CM | POA: Diagnosis not present

## 2016-01-14 DIAGNOSIS — N183 Chronic kidney disease, stage 3 (moderate): Secondary | ICD-10-CM | POA: Diagnosis not present

## 2016-01-14 DIAGNOSIS — Z7984 Long term (current) use of oral hypoglycemic drugs: Secondary | ICD-10-CM | POA: Diagnosis not present

## 2016-01-14 DIAGNOSIS — K219 Gastro-esophageal reflux disease without esophagitis: Secondary | ICD-10-CM | POA: Diagnosis not present

## 2016-01-14 DIAGNOSIS — I5022 Chronic systolic (congestive) heart failure: Secondary | ICD-10-CM | POA: Diagnosis not present

## 2016-01-14 DIAGNOSIS — I480 Paroxysmal atrial fibrillation: Secondary | ICD-10-CM | POA: Diagnosis not present

## 2016-01-14 DIAGNOSIS — E785 Hyperlipidemia, unspecified: Secondary | ICD-10-CM | POA: Diagnosis not present

## 2016-01-14 DIAGNOSIS — Z7982 Long term (current) use of aspirin: Secondary | ICD-10-CM | POA: Diagnosis not present

## 2016-01-14 DIAGNOSIS — M6281 Muscle weakness (generalized): Secondary | ICD-10-CM | POA: Diagnosis not present

## 2016-01-14 MED ORDER — FUROSEMIDE 40 MG PO TABS: 80.0000 mg | ORAL_TABLET | Freq: Every day | ORAL | Status: DC

## 2016-01-14 NOTE — Telephone Encounter (Signed)
Spoke with granddaughter and she states that her grandfather is currently on the 80mg  of lasix in the morning and 60 mg at night that was recently started by the cardiologist. Per last request the pt asked to have a social worker come out and evaluate the pt for an assisted living facility. The granddaughter agrees with the placement. I advised her that per the last conversation with Percell Miller that the pt wants a Education officer, museum to come out for the evaluation.

## 2016-01-14 NOTE — Telephone Encounter (Signed)
Please advise on note below.   

## 2016-01-14 NOTE — Patient Outreach (Signed)
[facility]) Care Management  01/14/2016  Jerrett Durling [birth year] [id number]  RN received a call back from this pt after a recent phone call to pt earlier today where pt declined [facility] for his behavior earlier today when he declined [facility] services with this RN. Pt realized the stress involved with rescheduled other services and indicated he just became irritation and felt overwhelmed. Pt now calm and receptive to RN and interested in engaging in community case management and requested a home visit. Based upon the review of pt's daily appointments pt not available for a home visit until next week. RN stress the importance of pt's participating in improving his ongoing HF monitoring with daily weights and willingness to learn the HF zones (again pt receptive and verbalized an understanding). The initial home visit was scheduled and pt will follow up accordingly.  Raina Mina, RN Care Management Coordinator [place] Office [phone]

## 2016-01-14 NOTE — Patient Outreach (Signed)
[facility]) Care Management  01/14/2016  Carmin Malen 1930-02-29 [id number]  Telephone Assessment  RN spoke with pt today and introduced the [facility] program and purpose for today's call. Note pt's granddaughter also present during this call. RN verified pt's identifiers and proceeded with the services available with [facility]. RN discussed pt's HF and discussed monitoring tools of prevention. When RN inquired if pt is weighing daily pt adamantly reported his weight today of 257.7 lbs and states he is doing everything he is suppose to do. Pt's granddaughter requested pt to "calm down" and states the nurse only want to help. Pt indicated he does not wish to have another involved services. Granddaughter confirmed with pt "you do not want the nurse to visit". Pt indicates "NO" more services with profound language use.  Due to pt's request and declined from further services RN will closed this case officially from further contact.  Raina Mina, RN Care Management Coordinator [place] Office [phone]

## 2016-01-14 NOTE — Telephone Encounter (Signed)
I spoke with Pam at [facility] and she states that the pt has been in a facility and the pt is concerned that his weight went from 248-257 within 1 week. Pam also needs a verbal order for a visit next week with Mr. Behrle. Pt is coming in for a visit on Tuesday. Please advise.

## 2016-01-14 NOTE — Telephone Encounter (Signed)
Would like weight tomorrow. Offer appointment. Cardiologist recently made adjustments. If he worsens then ED evaluation.

## 2016-01-14 NOTE — Telephone Encounter (Signed)
Spoke with Arbie Cookey and she voices understanding for the verbal order to move speech therapy.

## 2016-01-14 NOTE — Telephone Encounter (Signed)
Carol([phone] from [place] is calling and requesting a verbal order to move Speech eval to 5/1, pt refused to come this week

## 2016-01-14 NOTE — Telephone Encounter (Signed)
Ok to move speech evaluation. Can give verbal order.

## 2016-01-15 ENCOUNTER — Ambulatory Visit (INDEPENDENT_AMBULATORY_CARE_PROVIDER_SITE_OTHER): Payer: Medicare Other | Admitting: Adult Health

## 2016-01-15 ENCOUNTER — Encounter: Payer: Self-pay | Admitting: Adult Health

## 2016-01-15 ENCOUNTER — Ambulatory Visit (HOSPITAL_COMMUNITY)
Admission: RE | Admit: 2016-01-15 | Discharge: 2016-01-15 | Disposition: A | Discharge status: Home / Self Care | Payer: Medicare Other | Source: Ambulatory Visit | Attending: Emergency Medicine | Admitting: Emergency Medicine

## 2016-01-15 VITALS — BP 126/70 | HR 106 | Temp 98.2°F | Ht 72.0 in | Wt 260.0 lb

## 2016-01-15 DIAGNOSIS — J45909 Unspecified asthma, uncomplicated: Secondary | ICD-10-CM | POA: Insufficient documentation

## 2016-01-15 DIAGNOSIS — G4733 Obstructive sleep apnea (adult) (pediatric): Secondary | ICD-10-CM | POA: Diagnosis not present

## 2016-01-15 DIAGNOSIS — Z9989 Dependence on other enabling machines and devices: Secondary | ICD-10-CM

## 2016-01-15 DIAGNOSIS — J438 Other emphysema: Secondary | ICD-10-CM | POA: Diagnosis not present

## 2016-01-15 DIAGNOSIS — I5022 Chronic systolic (congestive) heart failure: Secondary | ICD-10-CM

## 2016-01-15 DIAGNOSIS — J4540 Moderate persistent asthma, uncomplicated: Secondary | ICD-10-CM

## 2016-01-15 LAB — PULMONARY FUNCTION TEST
DL/VA % PRED: 79 %
DL/VA: 3.75 ml/min/mmHg/L
DLCO UNC % PRED: 47 %
DLCO UNC: 16.54 ml/min/mmHg
FEF 25-75 POST: 2.63 L/s
FEF 25-75 PRE: 1.58 L/s
FEF2575-%Change-Post: 66 %
FEF2575-%PRED-POST: 132 %
FEF2575-%PRED-PRE: 79 %
FEV1-%Change-Post: 10 %
FEV1-%Pred-Post: 78 %
FEV1-%Pred-Pre: 71 %
FEV1-Post: 2.34 L
FEV1-Pre: 2.12 L
FEV1FVC-%Change-Post: 6 %
FEV1FVC-%Pred-Pre: 105 %
FEV6-%CHANGE-POST: 4 %
FEV6-%PRED-POST: 74 %
FEV6-%Pred-Pre: 71 %
FEV6-Post: 2.94 L
FEV6-Pre: 2.81 L
FEV6FVC-%CHANGE-POST: 0 %
FEV6FVC-%Pred-Post: 107 %
FEV6FVC-%Pred-Pre: 106 %
FVC-%Change-Post: 3 %
FVC-%PRED-POST: 69 %
FVC-%Pred-Pre: 67 %
FVC-Post: 2.94 L
FVC-Pre: 2.83 L
POST FEV1/FVC RATIO: 80 %
PRE FEV1/FVC RATIO: 75 %
PRE FEV6/FVC RATIO: 99 %
Post FEV6/FVC ratio: 100 %
RV % pred: 68 %
RV: 1.95 L
TLC % pred: 64 %
TLC: 4.78 L

## 2016-01-15 MED ORDER — ALBUTEROL SULFATE (2.5 MG/3ML) 0.083% IN NEBU
2.5000 mg | INHALATION_SOLUTION | Freq: Once | RESPIRATORY_TRACT | Status: AC
  Administered 2016-01-15: 2.5 mg via RESPIRATORY_TRACT

## 2016-01-15 NOTE — Telephone Encounter (Signed)
Spoke with Olin Hauser with [facility] [phone]) and she would a verbal order for nurse and home health and a nurse visit. I will get the paperwork sent over if any is needed to sign for the provider. Provider gave verbal ok to go ahead with the home health for a few more weeks.

## 2016-01-15 NOTE — Patient Instructions (Addendum)
Please continue your Ruthe Mannan twice a day for now.  Continue Spiriva daily  Continue on CPAP At bedtime   Follow up with Dr. Halford Chessman for sleep as planned  follow up Dr. Lamonte Sakai  In  Month as planned and As needed   Follow up with CHF clinic as planned.

## 2016-01-15 NOTE — Assessment & Plan Note (Signed)
Cont on CPAP At bedtime   follow up for sleep consult next month as planned

## 2016-01-15 NOTE — Patient Outreach (Signed)
[name]) Care Management  01/15/2016  Christopher Holder 05-07-1930 [id number]   Patient triggered RED on EMMI Heart Failure dashboard, notification sent to Raina Mina, RN.  Thanks, Ronnell Freshwater. [place], [place] Assistant Phone: [phone] Fax: [phone]

## 2016-01-15 NOTE — Assessment & Plan Note (Signed)
CAP c/by CHF in Nov 2016 with ? Post inflammatory fibrosis -CT chest showed parenchymal pattern of fibrosis . + mod pleural effusions  PFT shows some mild restriction with decreased DLCO  Will check labs with ANA , RA Factor , ANCA and ESR , BNP (pt left before labs done , will check on return ov in 1 week. -pt aware they are in computer and to go to lab)    Plan  Please continue your Christopher Holder twice a day for now.  Continue Spiriva daily  Continue on CPAP At bedtime   Follow up with Dr. Halford Chessman for sleep as planned  follow up Dr. Lamonte Sakai  In  Month as planned and As needed   Follow up with CHF clinic as planned.

## 2016-01-15 NOTE — Assessment & Plan Note (Signed)
Cont follow up with CHF clinic

## 2016-01-15 NOTE — Assessment & Plan Note (Signed)
Chronic asthma dx in never smoker  No airflow obstruction on PFT . Mild restriction and + BD response in mid flows.   Plan Please continue your Ruthe Mannan twice a day for now.  Continue Spiriva daily  Continue on CPAP At bedtime   Follow up with Dr. Halford Chessman for sleep as planned  follow up Dr. Lamonte Sakai  In  Month as planned and As needed   Follow up with CHF clinic as planned.

## 2016-01-15 NOTE — Progress Notes (Signed)
Subjective:    Patient ID: Christopher Holder, male    DOB: 07-08-1930, 82 y.o.   MRN: [id number]  HPI 82 yo male never smoker with asthma and OSA on CPAP  Has CAD , PAF , CHF (EF 25-30%) ,   TEST  PFT 01/15/2016 showed Post BD FEV1 78%, ratio 80, FVC 69%, no sign BD response. + Midflow BD response. DLCO 47%, CT chest HR -11/2015 parenchyma fibrosis c/w post inflammatory fibrosis .  VQ scan 12/16/15 low prob for PE.    01/15/2016 Follow up : Asthma  Pt returns for 3 month follow up. Pt was admitted in Dec for CAP , Acute Resp Failure , ALI with Sepsis and pulm edema. He says he has been weak since this admit.  He was set up for a HRCT chest done 11/2015 showed Pulmonary parenchymal pattern of fibrosis is most likely postinflammatory when compared with 08/25/2015.2. Moderate bilateral pleural effusions, right greater than left.VQ scan low prob.   was readmitted in March with CHF , AB flare .  On Dulera and Spiriva.  Had a PFT today that showed showed Post BD FEV1 78%, ratio 80, FVC 69%, no sign BD response. + Midflow BD response. DLCO 47%, He remains on Dulera and Spiriva .  Gets winded with little activity .   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 Did Architect with sheet rock work.  ? asbestis exposure while working Architect.  Never smoker, no travel. No unusal hobbies.  From [place] and [place].  No known amiodarone exposure.    Has OSA on CPAP At bedtime  .   Follows with CHF clinic , on lasix 80mg  in am and 60mg  in pm.  Last EF 25-30%.  Legs still swell.    Past Medical History  Diagnosis Date  . Diabetes mellitus without complication ([place])   . CHF (congestive heart failure) ([place])   . Coronary artery disease   . COPD (chronic obstructive pulmonary disease) ([place])   . Asthma   . Hypertension   . HOH (hard of hearing)   . Sleep apnea   . Myocardial infarction ([place])   . Hyperlipidemia 05/27/2015  . GERD (gastroesophageal reflux disease) 05/27/2015  . AKI (acute kidney  injury) ([place]) 08/18/2015   Current Outpatient Prescriptions on File Prior to Visit  Medication Sig Dispense Refill  . albuterol (PROVENTIL HFA;VENTOLIN HFA) 108 (90 BASE) MCG/ACT inhaler Inhale 2 puffs into the lungs every 4 (four) hours as needed for wheezing or shortness of breath.     . allopurinol (ZYLOPRIM) 100 MG tablet Take 1 tablet by mouth two  times daily 120 tablet 1  . carvedilol (COREG) 6.25 MG tablet Take 1 tablet (6.25 mg total) by mouth 2 (two) times daily with a meal.    . DULERA 200-5 MCG/ACT AERO Use 2 puffs two times daily 26 g 1  . ELIQUIS 5 MG TABS tablet Take 1 tablet (5 mg total) by mouth 2 (two) times daily. 120 tablet 2  . fluticasone (FLONASE) 50 MCG/ACT nasal spray Use 2 sprays in each  nostril daily 32 g 1  . furosemide (LASIX) 40 MG tablet Take 1.5 tablets (60 mg total) by mouth every evening. 30 tablet 0  . furosemide (LASIX) 40 MG tablet Take 2 tablets (80 mg total) by mouth daily. In the AM 90 tablet 1  . glipiZIDE (GLUCOTROL) 5 MG tablet Take 5 mg by mouth. Reported on 12/29/2015    . losartan (COZAAR) 25 MG tablet Take 0.5  tablets (12.5 mg total) by mouth daily. 30 tablet 6  . montelukast (SINGULAIR) 10 MG tablet Take 1 tablet by mouth at  bedtime 90 tablet 0  . NON FORMULARY Take 15 each by mouth Nightly. CPAP at night    . omeprazole (PRILOSEC) 20 MG capsule Take 1 capsule by mouth  daily 60 capsule 1  . potassium chloride SA (K-DUR,KLOR-CON) 20 MEQ tablet Take 20 mEq by mouth 2 (two) times daily.    . simvastatin (ZOCOR) 20 MG tablet Take 1 tablet by mouth  daily 90 tablet 1  . tiotropium (SPIRIVA) 18 MCG inhalation capsule Place 1 capsule (18 mcg total) into inhaler and inhale daily. 30 capsule 12   No current facility-administered medications on file prior to visit.    .   Review of Systems Constitutional:   No  weight loss, night sweats,  Fevers, chills,  +fatigue, or  lassitude.  HEENT:   No headaches,  Difficulty swallowing,  Tooth/dental  problems, or  Sore throat,                No sneezing, itching, ear ache, nasal congestion, post nasal drip,   CV:  No chest pain,  Orthopnea, PND,  anasarca, dizziness, palpitations, syncope.   GI  No heartburn, indigestion, abdominal pain, nausea, vomiting, diarrhea, change in bowel habits, loss of appetite, bloody stools.   Resp:   No chest wall deformity  Skin: no rash or lesions.  GU: no dysuria, change in color of urine, no urgency or frequency.  No flank pain, no hematuria   MS:  No joint pain or swelling.  No decreased range of motion.  No back pain.  Psych:  No change in mood or affect. No depression or anxiety.  No memory loss.         Objective:   Physical Exam   Filed Vitals:   01/15/16 1558  BP: 126/70  Pulse: 106  Temp: 98.2 F (36.8 C)  TempSrc: Oral  Height: 6' (1.829 m)  Weight: 260 lb (117.935 kg)  SpO2: 97%  Body mass index is 35.25 kg/(m^2).   GEN: A/Ox3; pleasant , NAD, morbidly obese   HEENT:  [place]/AT,  EACs-clear, TMs-wnl, NOSE-clear, THROAT-clear, no lesions, no postnasal drip or exudate noted.   NECK:  Supple w/ fair ROM; no JVD; normal carotid impulses w/o bruits; no thyromegaly or nodules palpated; no lymphadenopathy.  RESP  Decrease BS in bases , no accessory muscle use, no dullness to percussion  CARD:  RRR, no m/r/g  , 1+ peripheral edema, pulses intact, no cyanosis or clubbing.  GI:   Soft & nt; nml bowel sounds; no organomegaly or masses detected.  Musco: Warm bil, no deformities or joint swelling noted.   Neuro: alert, no focal deficits noted.    Skin: Warm, no lesions or rashes   HRCT Chest 12/16/15 Pulmonary parenchymal pattern of fibrosis c/w postinflammatory , mod pleural effusion.   Daxx Tiggs NP-C  [facility]  01/15/2016      Assessment & Plan:

## 2016-01-18 ENCOUNTER — Other Ambulatory Visit: Payer: Self-pay | Admitting: *Deleted

## 2016-01-18 DIAGNOSIS — J449 Chronic obstructive pulmonary disease, unspecified: Secondary | ICD-10-CM | POA: Diagnosis not present

## 2016-01-18 DIAGNOSIS — E785 Hyperlipidemia, unspecified: Secondary | ICD-10-CM | POA: Diagnosis not present

## 2016-01-18 DIAGNOSIS — R262 Difficulty in walking, not elsewhere classified: Secondary | ICD-10-CM | POA: Diagnosis not present

## 2016-01-18 DIAGNOSIS — M6281 Muscle weakness (generalized): Secondary | ICD-10-CM | POA: Diagnosis not present

## 2016-01-18 DIAGNOSIS — K219 Gastro-esophageal reflux disease without esophagitis: Secondary | ICD-10-CM | POA: Diagnosis not present

## 2016-01-18 DIAGNOSIS — E1122 Type 2 diabetes mellitus with diabetic chronic kidney disease: Secondary | ICD-10-CM | POA: Diagnosis not present

## 2016-01-18 DIAGNOSIS — G4733 Obstructive sleep apnea (adult) (pediatric): Secondary | ICD-10-CM | POA: Diagnosis not present

## 2016-01-18 DIAGNOSIS — I5022 Chronic systolic (congestive) heart failure: Secondary | ICD-10-CM | POA: Diagnosis not present

## 2016-01-18 DIAGNOSIS — E1140 Type 2 diabetes mellitus with diabetic neuropathy, unspecified: Secondary | ICD-10-CM | POA: Diagnosis not present

## 2016-01-18 DIAGNOSIS — N183 Chronic kidney disease, stage 3 (moderate): Secondary | ICD-10-CM | POA: Diagnosis not present

## 2016-01-18 DIAGNOSIS — I482 Chronic atrial fibrillation: Secondary | ICD-10-CM | POA: Diagnosis not present

## 2016-01-18 DIAGNOSIS — Z7984 Long term (current) use of oral hypoglycemic drugs: Secondary | ICD-10-CM | POA: Diagnosis not present

## 2016-01-18 DIAGNOSIS — Z7982 Long term (current) use of aspirin: Secondary | ICD-10-CM | POA: Diagnosis not present

## 2016-01-18 NOTE — Patient Outreach (Signed)
[facility]) Care Management  01/18/2016  Christopher Holder [birth year] [id number]  EMMI RED on 4/27   RN followed up with pt due to EMMI red on 4/27 and recent visit to his doctor with respiratory issues. Pt states he is doing a lot better and reports his weight today at 259 lbs with no addition distress and denies any swelling as RN reiterated on the HF zones and again what to do if acute symptoms should be encountered. Pt states he will follow up with his primary provider tomorrow with Dr. Harvie Heck. Pt verified the scheduled appointment with this RN on 5/11 and again thankful for the follow up call today. Will follow up accordingly.  Raina Mina, RN Care Management Coordinator [place] [phone]

## 2016-01-19 ENCOUNTER — Other Ambulatory Visit: Payer: Self-pay | Admitting: *Deleted

## 2016-01-19 ENCOUNTER — Ambulatory Visit (INDEPENDENT_AMBULATORY_CARE_PROVIDER_SITE_OTHER): Payer: Medicare Other | Admitting: Medical

## 2016-01-19 ENCOUNTER — Encounter: Payer: Self-pay | Admitting: Medical

## 2016-01-19 VITALS — BP 114/64 | HR 67 | Temp 98.1°F | Ht 72.0 in | Wt 263.4 lb

## 2016-01-19 DIAGNOSIS — M6281 Muscle weakness (generalized): Secondary | ICD-10-CM | POA: Diagnosis not present

## 2016-01-19 DIAGNOSIS — E119 Type 2 diabetes mellitus without complications: Secondary | ICD-10-CM

## 2016-01-19 DIAGNOSIS — I482 Chronic atrial fibrillation: Secondary | ICD-10-CM | POA: Diagnosis not present

## 2016-01-19 DIAGNOSIS — I5022 Chronic systolic (congestive) heart failure: Secondary | ICD-10-CM | POA: Diagnosis not present

## 2016-01-19 DIAGNOSIS — J449 Chronic obstructive pulmonary disease, unspecified: Secondary | ICD-10-CM | POA: Diagnosis not present

## 2016-01-19 DIAGNOSIS — F329 Major depressive disorder, single episode, unspecified: Secondary | ICD-10-CM | POA: Diagnosis not present

## 2016-01-19 DIAGNOSIS — E1122 Type 2 diabetes mellitus with diabetic chronic kidney disease: Secondary | ICD-10-CM | POA: Diagnosis not present

## 2016-01-19 DIAGNOSIS — G4733 Obstructive sleep apnea (adult) (pediatric): Secondary | ICD-10-CM | POA: Diagnosis not present

## 2016-01-19 DIAGNOSIS — G4700 Insomnia, unspecified: Secondary | ICD-10-CM

## 2016-01-19 DIAGNOSIS — E1140 Type 2 diabetes mellitus with diabetic neuropathy, unspecified: Secondary | ICD-10-CM | POA: Diagnosis not present

## 2016-01-19 DIAGNOSIS — E785 Hyperlipidemia, unspecified: Secondary | ICD-10-CM | POA: Diagnosis not present

## 2016-01-19 DIAGNOSIS — N183 Chronic kidney disease, stage 3 (moderate): Secondary | ICD-10-CM | POA: Diagnosis not present

## 2016-01-19 DIAGNOSIS — F32A Depression, unspecified: Secondary | ICD-10-CM

## 2016-01-19 DIAGNOSIS — Z7982 Long term (current) use of aspirin: Secondary | ICD-10-CM | POA: Diagnosis not present

## 2016-01-19 DIAGNOSIS — E1149 Type 2 diabetes mellitus with other diabetic neurological complication: Secondary | ICD-10-CM

## 2016-01-19 DIAGNOSIS — R262 Difficulty in walking, not elsewhere classified: Secondary | ICD-10-CM | POA: Diagnosis not present

## 2016-01-19 DIAGNOSIS — Z7984 Long term (current) use of oral hypoglycemic drugs: Secondary | ICD-10-CM | POA: Diagnosis not present

## 2016-01-19 DIAGNOSIS — K219 Gastro-esophageal reflux disease without esophagitis: Secondary | ICD-10-CM | POA: Diagnosis not present

## 2016-01-19 LAB — SEDIMENTATION RATE: Sed Rate: 41 mm/hr — ABNORMAL HIGH (ref 0–22)

## 2016-01-19 LAB — BASIC METABOLIC PANEL
BUN: 23 mg/dL (ref 6–23)
CHLORIDE: 102 meq/L (ref 96–112)
CO2: 32 meq/L (ref 19–32)
CREATININE: 1.45 mg/dL (ref 0.40–1.50)
Calcium: 9.2 mg/dL (ref 8.4–10.5)
GFR: 49.33 mL/min — ABNORMAL LOW (ref >60.00)
GLUCOSE: 161 mg/dL — AB (ref 70–99)
Potassium: 3.4 mEq/L — ABNORMAL LOW (ref 3.5–5.1)
Sodium: 142 mEq/L (ref 135–145)

## 2016-01-19 LAB — RHEUMATOID FACTOR

## 2016-01-19 MED ORDER — LOSARTAN POTASSIUM 25 MG PO TABS: 12.5000 mg | ORAL_TABLET | Freq: Every day | ORAL | Status: DC

## 2016-01-19 MED ORDER — CARVEDILOL 3.125 MG PO TABS: ORAL_TABLET | ORAL | Status: DC

## 2016-01-19 MED ORDER — GLIPIZIDE 5 MG PO TABS: 5.0000 mg | ORAL_TABLET | Freq: Two times a day (BID) | ORAL | Status: DC

## 2016-01-19 MED ORDER — TRAZODONE HCL 50 MG PO TABS: ORAL_TABLET | ORAL | Status: DC

## 2016-01-19 MED ORDER — TRAZODONE HCL 50 MG PO TABS: ORAL_TABLET | ORAL | Status: AC

## 2016-01-19 MED FILL — traZODone HCL 50 MG TABS: 50 | 30 days supply | Qty: 15 | Fill #0

## 2016-01-19 NOTE — Telephone Encounter (Signed)
Pt dropped off form for diabetic shoes on 01/19/16. Pt had filled out his part and I filled out what other information needed to be on the form. It was given to Dr. Carollee Herter for signature and she signed and put back in her blue folder. I made a copy and sent it for scanning. I advised the granddaughter that the form was in the front for pick up. She voices understanding.

## 2016-01-19 NOTE — Progress Notes (Signed)
Pre visit review using our clinic review tool, if applicable. No additional management support is needed unless otherwise documented below in the visit note. 

## 2016-01-19 NOTE — Progress Notes (Signed)
Subjective:    Patient ID: Christopher Holder, male    DOB: December 04, 1929, 82 y.o.   MRN: [id number]  HPI  Pt in for follow up. Pt has been wieghing himself at home. His last 11 readings  since 01-09-2016 have been 250.9, 250.0, 252.0, 255.1, 255.1, 257.7, 260.6, 259.0, 259.0, and 256.2.(these were at home). Last weight check here was 260 on last visit. Today weight check was 263 lb.When pt weighs at home he is weighing with no clothes.  Pt 02 sat today was 98%.  Pt has seen cardiologist at heart failure clinic. Pt is on lasix 80 mg in am and 60 mg in pm. He is on on k 20 mequ.  Below is cardiologist assessment and plan. 1. Chronic Systolic HF- NICM ECHO 123XX123 EF 25-30% . Plan to repeat ECHO after HF meds optimized at least 3 months. Likely July.  NYHA II-III. Volume status stable. Continue lasix 80 mg in am and 60 mg in the afternoon + 20 meq K daily.  - Continue coreg to 6.25 mg twice a day - Add 12.5 mg losartan daily. Hold off on entresto for now with BP.  - Consider spiro next visit.  BMET today.  2. Atrial Fibrillation- In A fib today. Rate controlled.  On eliquis. On carvedilol. Can consider DC-CV but seems well compensated with rate control.  3. CAD- S/P D1 Stent -patent LHC 2017 On eliquis. On statin and BB 4. HTN- Controlled continue current regimen.  5. HLD- on statin 6. CKD stage III- check BMET today.  7. COPD 8. OSA with CPAP- continue nightly  9. CKD Stage III- BMET today   Pt has also seen pulmonologist recently as well. Pt will seen Pulmonologist next week. Pt left without getting labs done.  Pt is diabetic and has not gotten. Pt and grandaughter agree he has diabetic neuropathy and hx of callous. Pt has old pair of diabetic shoes.    Some insomnia at times and grandaughter thinks sometimes mood depressed. But last 2 weeks mood was better but befor was moody.      Review of Systems  Constitutional: Negative for fever, chills, diaphoresis, activity  change and fatigue.  Respiratory: Positive for shortness of breath. Negative for cough and chest tightness.        Basline sob. Mild presently.  Cardiovascular: Negative for chest pain, palpitations and leg swelling.  Gastrointestinal: Negative for nausea, vomiting and abdominal pain.  Musculoskeletal: Negative for neck pain and neck stiffness.  Neurological: Negative for dizziness, facial asymmetry, weakness, light-headedness and numbness.  Psychiatric/Behavioral: Positive for sleep disturbance and dysphoric mood. Negative for suicidal ideas, behavioral problems, confusion and agitation. The patient is not nervous/anxious.    Past Medical History  Diagnosis Date  . Diabetes mellitus without complication ([place])   . CHF (congestive heart failure) ([place])   . Coronary artery disease   . COPD (chronic obstructive pulmonary disease) ([place])   . Asthma   . Hypertension   . HOH (hard of hearing)   . Sleep apnea   . Myocardial infarction ([place])   . Hyperlipidemia 05/27/2015  . GERD (gastroesophageal reflux disease) 05/27/2015  . AKI (acute kidney injury) ([place]) 08/18/2015     Social History   Social History  . Marital Status: Widowed    Spouse Name: [name]  . Number of Children: [name]  . Years of Education: [name]   Occupational History  . Not on file.   Social History Main Topics  . Smoking status: Never Smoker   .  Smokeless tobacco: Never Used  . Alcohol Use: No  . Drug Use: No  . Sexual Activity: Not on file   Other Topics Concern  . Not on file   Social History Narrative    Past Surgical History  Procedure Laterality Date  . Coronary angioplasty with stent placement    . Knee surgery    . Knee surgery      Knee replacement x2  . Cardioversion [name] 08/28/2015    Procedure: CARDIOVERSION;  Surgeon: Jerline Pain, MD;  Location: [place];  Service: Cardiovascular;  Laterality: [name];  . Cardiac catheterization [name] 10/02/2015    Procedure: Left Heart Cath and Coronary Angiography;   Surgeon: Leonie Man, MD;  Location: [facility];  Service: Cardiovascular;  Laterality: [name];    Family History  Problem Relation Age of Onset  . Cancer Mother   . Diabetes Father   . Heart attack Brother   . Heart attack Sister     Allergies  Allergen Reactions  . Percocet [Oxycodone-Acetaminophen] Diarrhea, Nausea And Vomiting and Nausea Only  . Sulfa Antibiotics Diarrhea and Nausea And Vomiting    Current Outpatient Prescriptions on File Prior to Visit  Medication Sig Dispense Refill  . albuterol (PROVENTIL HFA;VENTOLIN HFA) 108 (90 BASE) MCG/ACT inhaler Inhale 2 puffs into the lungs every 4 (four) hours as needed for wheezing or shortness of breath.     . allopurinol (ZYLOPRIM) 100 MG tablet Take 1 tablet by mouth two  times daily 120 tablet 1  . carvedilol (COREG) 6.25 MG tablet Take 1 tablet (6.25 mg total) by mouth 2 (two) times daily with a meal.    . DULERA 200-5 MCG/ACT AERO Use 2 puffs two times daily 26 g 1  . ELIQUIS 5 MG TABS tablet Take 1 tablet (5 mg total) by mouth 2 (two) times daily. 120 tablet 2  . fluticasone (FLONASE) 50 MCG/ACT nasal spray Use 2 sprays in each  nostril daily 32 g 1  . furosemide (LASIX) 40 MG tablet Take 1.5 tablets (60 mg total) by mouth every evening. 30 tablet 0  . furosemide (LASIX) 40 MG tablet Take 2 tablets (80 mg total) by mouth daily. In the AM 90 tablet 1  . glipiZIDE (GLUCOTROL) 5 MG tablet Take 5 mg by mouth. Reported on 12/29/2015    . losartan (COZAAR) 25 MG tablet Take 0.5 tablets (12.5 mg total) by mouth daily. 30 tablet 6  . montelukast (SINGULAIR) 10 MG tablet Take 1 tablet by mouth at  bedtime 90 tablet 0  . NON FORMULARY Take 15 each by mouth Nightly. CPAP at night    . omeprazole (PRILOSEC) 20 MG capsule Take 1 capsule by mouth  daily 60 capsule 1  . potassium chloride SA (K-DUR,KLOR-CON) 20 MEQ tablet Take 20 mEq by mouth 2 (two) times daily.    . simvastatin (ZOCOR) 20 MG tablet Take 1 tablet by mouth  daily 90  tablet 1  . tiotropium (SPIRIVA) 18 MCG inhalation capsule Place 1 capsule (18 mcg total) into inhaler and inhale daily. 30 capsule 12   No current facility-administered medications on file prior to visit.    BP 114/64 mmHg  Pulse 67  Temp(Src) 98.1 F (36.7 C) (Oral)  Ht 6' (1.829 m)  Wt 263 lb 6.4 oz (119.477 kg)  BMI 35.72 kg/m2  SpO2 98%       Objective:   Physical Exam  General Mental Status- Alert. General Appearance- Not in acute distress.  Skin General: Color- Normal Color. Moisture- Normal Moisture.  Neck Carotid Arteries- Normal color. Moisture- Normal Moisture. No carotid bruits. No JVD.  Chest and Lung Exam Auscultation: Breath Sounds:-Normal.  Cardiovascular Auscultation:Rythm- Regular. Murmurs & Other Heart Sounds:Auscultation of the heart reveals- No Murmurs.  Abdomen Inspection:-Inspeection Normal. Palpation/Percussion:Note:No mass. Palpation and Percussion of the abdomen reveal- Non Tender, Non Distended + BS, no rebound or guarding.    Neurologic Cranial Nerve exam:- CN III-XII intact(No nystagmus), symmetric smile. Strength:- 5/5 equal and symmetric strength both upper and lower extremities.  Lower ext- no pedal edema calves symmetric, neg homans signs.,  both feet some hallux valgus. Rt foot bottom aspect mid metatarsal area has small callous Lt foot- bottom aspect faint small callous. No ulceration on either foot. Decreased sensation on both feet.       Assessment & Plan:  For your chf. Continue with recommendation given by cardiologist at heart failure clinic. We will send in meds needed to your mail order pharmacy.  For your diabetes I refilled your medication today. Confirmed  No prior reaction.  Keep watching you weights daily. If you get weight gain, pedal edema or more shortness of breath then return to our office. If severe after hours then ED evaluation.  We filled out your pamphlet to get new diabetic shoes today.  For your  insomnia will rx low dose trazadone.  Follow up in 3 weeks or as needed.  Also we signed home health order recently as well.  Allysha Tryon, Percell Miller, PA-C

## 2016-01-19 NOTE — Patient Outreach (Signed)
[name]) Care Management  01/19/2016  Christopher Holder 1930-02-15 [id number]   Telephone Assessment (EMMI RED 5/1)  RN spoke with pt today and inquired on pt's reported symptoms via EMMI red. Pt states "no better, no worse" but he visited his provider today and prescribed Trazodone.  Caregiver in the background of the call assisting pt with the inquires today (pt permitted) and indicated she will have to follow up with the provider's office on the requested follow up appointment. Reported today was pt's weight for today 256.2 lbs and yesterday 259 lbs. No major issues as caregiver reports pt indicated he just "hangs up" on the call concerning the EMMI inquiry so he is not sure on his responses. Pt verified no swelling today however ongoing SOB with activities but no more then his usual. Reminded pt pt the upcoming appointment with this RN and encouraged pt to contact this RN if a visit is needed sooner for his ongoing education and teachings concerning his HF symptoms. RN will again follow up next Thursday for the initial home visit and will update pt's primary doctor accordingly.   Raina Mina, RN Care Management Coordinator [place] Office [phone]

## 2016-01-19 NOTE — Patient Instructions (Addendum)
For your chf. Continue with recommendation given by cardiologist at heart failure clinic. We will send in meds needed to your mail order pharmacy.  For your diabetes I refilled your medication today. Confirmed  No prior reaction.  Keep watching you weights daily. If you get weight gain, pedal edema or more shortness of breath then return to our office. If severe after hours then ED evaluation.  We filled out your pamphlet to get new diabetic shoes today.  For your insomnia will rx low dose trazadone.  Follow up in 3 weeks or as needed.  Also we signed home health order recently as well.

## 2016-01-19 NOTE — Telephone Encounter (Signed)
Will you send in pt lasix to mail order. Give 3 months worth with 3 refills. Thanks.

## 2016-01-19 NOTE — Patient Outreach (Signed)
[facility]) Care Management  01/19/2016  Christopher Holder [birth year] [id number]   Patient triggered RED on EMMI Heart Failure dashboard, notification sent to Raina Mina, RN.  Thanks, Ronnell Freshwater. [place], [place] Assistant Phone: [phone] Fax: [phone]

## 2016-01-20 ENCOUNTER — Other Ambulatory Visit: Payer: Self-pay

## 2016-01-20 ENCOUNTER — Telehealth: Payer: Self-pay | Admitting: Medical

## 2016-01-20 LAB — ANA: Anti Nuclear Antibody(ANA): NEGATIVE

## 2016-01-20 LAB — ANCA SCREEN W REFLEX TITER: ANCA Screen: NEGATIVE

## 2016-01-20 MED ORDER — FUROSEMIDE 40 MG PO TABS: 80.0000 mg | ORAL_TABLET | Freq: Every day | ORAL | Status: DC

## 2016-01-20 NOTE — Telephone Encounter (Signed)
Rx sent to pharmacy with refills per providers request 01/20/16.

## 2016-01-20 NOTE — Telephone Encounter (Signed)
Can give verbal to continue PT and I will sign order.

## 2016-01-20 NOTE — Telephone Encounter (Signed)
Rx sent to pharmacy   

## 2016-01-20 NOTE — Telephone Encounter (Signed)
Spoke with Anderson Malta and she states that she will send over the paperwork for provider to fill out.

## 2016-01-20 NOTE — Telephone Encounter (Signed)
Please advise 

## 2016-01-20 NOTE — Telephone Encounter (Signed)
Caller name: Anderson Malta Relationship to patient: Ardeen Fillers Can be reached: [phone]    Reason for call: Would like to extend his PT for 1 time this week and then 2 times a week for 3 weeks.

## 2016-01-21 DIAGNOSIS — N183 Chronic kidney disease, stage 3 (moderate): Secondary | ICD-10-CM | POA: Diagnosis not present

## 2016-01-21 DIAGNOSIS — Z7982 Long term (current) use of aspirin: Secondary | ICD-10-CM | POA: Diagnosis not present

## 2016-01-21 DIAGNOSIS — G4733 Obstructive sleep apnea (adult) (pediatric): Secondary | ICD-10-CM | POA: Diagnosis not present

## 2016-01-21 DIAGNOSIS — M6281 Muscle weakness (generalized): Secondary | ICD-10-CM | POA: Diagnosis not present

## 2016-01-21 DIAGNOSIS — R262 Difficulty in walking, not elsewhere classified: Secondary | ICD-10-CM | POA: Diagnosis not present

## 2016-01-21 DIAGNOSIS — Z7984 Long term (current) use of oral hypoglycemic drugs: Secondary | ICD-10-CM | POA: Diagnosis not present

## 2016-01-21 DIAGNOSIS — E1140 Type 2 diabetes mellitus with diabetic neuropathy, unspecified: Secondary | ICD-10-CM | POA: Diagnosis not present

## 2016-01-21 DIAGNOSIS — I5022 Chronic systolic (congestive) heart failure: Secondary | ICD-10-CM | POA: Diagnosis not present

## 2016-01-21 DIAGNOSIS — E1122 Type 2 diabetes mellitus with diabetic chronic kidney disease: Secondary | ICD-10-CM | POA: Diagnosis not present

## 2016-01-21 DIAGNOSIS — I482 Chronic atrial fibrillation: Secondary | ICD-10-CM | POA: Diagnosis not present

## 2016-01-21 DIAGNOSIS — J449 Chronic obstructive pulmonary disease, unspecified: Secondary | ICD-10-CM | POA: Diagnosis not present

## 2016-01-21 DIAGNOSIS — E785 Hyperlipidemia, unspecified: Secondary | ICD-10-CM | POA: Diagnosis not present

## 2016-01-21 DIAGNOSIS — K219 Gastro-esophageal reflux disease without esophagitis: Secondary | ICD-10-CM | POA: Diagnosis not present

## 2016-01-22 DIAGNOSIS — G4733 Obstructive sleep apnea (adult) (pediatric): Secondary | ICD-10-CM | POA: Diagnosis not present

## 2016-01-22 DIAGNOSIS — I5022 Chronic systolic (congestive) heart failure: Secondary | ICD-10-CM | POA: Diagnosis not present

## 2016-01-22 DIAGNOSIS — R262 Difficulty in walking, not elsewhere classified: Secondary | ICD-10-CM | POA: Diagnosis not present

## 2016-01-22 DIAGNOSIS — K219 Gastro-esophageal reflux disease without esophagitis: Secondary | ICD-10-CM | POA: Diagnosis not present

## 2016-01-22 DIAGNOSIS — Z7984 Long term (current) use of oral hypoglycemic drugs: Secondary | ICD-10-CM | POA: Diagnosis not present

## 2016-01-22 DIAGNOSIS — M6281 Muscle weakness (generalized): Secondary | ICD-10-CM | POA: Diagnosis not present

## 2016-01-22 DIAGNOSIS — N183 Chronic kidney disease, stage 3 (moderate): Secondary | ICD-10-CM | POA: Diagnosis not present

## 2016-01-22 DIAGNOSIS — I482 Chronic atrial fibrillation: Secondary | ICD-10-CM | POA: Diagnosis not present

## 2016-01-22 DIAGNOSIS — E785 Hyperlipidemia, unspecified: Secondary | ICD-10-CM | POA: Diagnosis not present

## 2016-01-22 DIAGNOSIS — Z7982 Long term (current) use of aspirin: Secondary | ICD-10-CM | POA: Diagnosis not present

## 2016-01-22 DIAGNOSIS — E1122 Type 2 diabetes mellitus with diabetic chronic kidney disease: Secondary | ICD-10-CM | POA: Diagnosis not present

## 2016-01-22 DIAGNOSIS — J449 Chronic obstructive pulmonary disease, unspecified: Secondary | ICD-10-CM | POA: Diagnosis not present

## 2016-01-22 DIAGNOSIS — E1140 Type 2 diabetes mellitus with diabetic neuropathy, unspecified: Secondary | ICD-10-CM | POA: Diagnosis not present

## 2016-01-22 NOTE — Progress Notes (Signed)
Quick Note:  LVM for pt to return call ______ 

## 2016-01-25 ENCOUNTER — Ambulatory Visit (INDEPENDENT_AMBULATORY_CARE_PROVIDER_SITE_OTHER): Payer: Medicare Other | Admitting: Pulmonary Disease

## 2016-01-25 ENCOUNTER — Telehealth: Payer: Self-pay | Admitting: Medical

## 2016-01-25 ENCOUNTER — Telehealth: Payer: Self-pay | Admitting: *Deleted

## 2016-01-25 ENCOUNTER — Encounter: Payer: Self-pay | Admitting: Pulmonary Disease

## 2016-01-25 VITALS — BP 94/56 | HR 107 | Ht 72.0 in | Wt 268.6 lb

## 2016-01-25 DIAGNOSIS — Z9989 Dependence on other enabling machines and devices: Principal | ICD-10-CM

## 2016-01-25 DIAGNOSIS — G4733 Obstructive sleep apnea (adult) (pediatric): Secondary | ICD-10-CM | POA: Diagnosis not present

## 2016-01-25 DIAGNOSIS — J8410 Pulmonary fibrosis, unspecified: Secondary | ICD-10-CM

## 2016-01-25 DIAGNOSIS — J4530 Mild persistent asthma, uncomplicated: Secondary | ICD-10-CM | POA: Diagnosis not present

## 2016-01-25 NOTE — Progress Notes (Signed)
   Subjective:    Patient ID: Christopher Holder, male    DOB: June 21, 1930, 82 y.o.   MRN: [id number]  HPI    Review of Systems  Constitutional: Negative for fever and unexpected weight change.  HENT: Negative for congestion, dental problem, ear pain, nosebleeds, postnasal drip, rhinorrhea, sinus pressure, sneezing, sore throat and trouble swallowing.   Eyes: Negative for redness and itching.  Respiratory: Positive for cough and shortness of breath. Negative for chest tightness and wheezing.   Cardiovascular: Palpitations:  irregular heartbeats. Leg swelling:  feet swelling.  Gastrointestinal: Negative for nausea and vomiting.  Genitourinary: Negative for dysuria.  Musculoskeletal: Negative for joint swelling.  Skin: Negative for rash.  Neurological: Negative for headaches.  Hematological: Does not bruise/bleed easily.  Psychiatric/Behavioral: Negative for dysphoric mood. The patient is not nervous/anxious.        Objective:   Physical Exam        Assessment & Plan:

## 2016-01-25 NOTE — Telephone Encounter (Signed)
Spoke with Christopher Holder and she states that Mr. Masoner will no longer need speech therapy. Provider was made aware of the note below and he voices agrees.

## 2016-01-25 NOTE — Patient Instructions (Signed)
Will try to get new CPAP supplies set up through new CPAP company  Can cancel follow up with Dr. Lamonte Sakai next week  Follow up with Dr. Halford Chessman in 4 months

## 2016-01-25 NOTE — Telephone Encounter (Signed)
Received Physician Communication and Interim Order; forwarded to provider/SLS 05/08

## 2016-01-25 NOTE — Telephone Encounter (Signed)
Caller name:Carol Relation to [id number] Call back number:[phone] Pharmacy:  Reason for call: Arbie Cookey would like to provide the results for pt's speech therapy. Speech therapy eval completed no further treatment needed.

## 2016-01-25 NOTE — Progress Notes (Signed)
Current Outpatient Prescriptions on File Prior to Visit  Medication Sig  . albuterol (PROVENTIL HFA;VENTOLIN HFA) 108 (90 BASE) MCG/ACT inhaler Inhale 2 puffs into the lungs every 4 (four) hours as needed for wheezing or shortness of breath.   . allopurinol (ZYLOPRIM) 100 MG tablet Take 1 tablet by mouth two  times daily  . carvedilol (COREG) 3.125 MG tablet 1 tab po bid  . DULERA 200-5 MCG/ACT AERO Use 2 puffs two times daily  . ELIQUIS 5 MG TABS tablet Take 1 tablet (5 mg total) by mouth 2 (two) times daily.  . fluticasone (FLONASE) 50 MCG/ACT nasal spray Use 2 sprays in each  nostril daily  . furosemide (LASIX) 40 MG tablet Take 2 tablets (80 mg total) by mouth daily. In the AM  . glipiZIDE (GLUCOTROL) 5 MG tablet Take 1 tablet (5 mg total) by mouth 2 (two) times daily before a meal. Reported on 12/29/2015  . losartan (COZAAR) 25 MG tablet Take 0.5 tablets (12.5 mg total) by mouth daily.  . montelukast (SINGULAIR) 10 MG tablet Take 1 tablet by mouth at  bedtime  . NON FORMULARY Take 15 each by mouth Nightly. CPAP at night  . omeprazole (PRILOSEC) 20 MG capsule Take 1 capsule by mouth  daily  . simvastatin (ZOCOR) 20 MG tablet Take 1 tablet by mouth  daily  . tiotropium (SPIRIVA) 18 MCG inhalation capsule Place 1 capsule (18 mcg total) into inhaler and inhale daily.  . traZODone (DESYREL) 50 MG tablet 1/2 tab po q hs   No current facility-administered medications on file prior to visit.    Chief Complaint  Patient presents with  . SLEEP CONSULT    Referred by Dr Lamonte Sakai. Needs new DME and filters, not changed in over 1 year. Pt purchased machine in [place]. Epworth Score: 3    Tests: Echo 08/21/15 >> EF 25 to 30%, mod AR, PAS 41 mmHg HRCT chest 12/16/15 >> patchy GGO, traction BTX, subpleural reticulation, 4.3 cm ascending thoracic aorta VQ scan 12/16/15 >> very low probability for PE PFT 01/15/16 >> FEV1 2.34 (78%), FEV1% 80, TLC 4.78 (64%), DLCO 47% Labs 01/19/16 >> ANCA negative, RF <  10, ANA negative  Past medical history He  has a past medical history of Diabetes mellitus without complication ([place]); CHF (congestive heart failure) ([place]); Coronary artery disease; COPD (chronic obstructive pulmonary disease) ([place]); Asthma; Hypertension; HOH (hard of hearing); Sleep apnea; Myocardial infarction ([place]); Hyperlipidemia (05/27/2015); GERD (gastroesophageal reflux disease) (05/27/2015); and AKI (acute kidney injury) ([place]) (08/18/2015).  Vital signs BP 94/56 mmHg  Pulse 107  Ht 6' (1.829 m)  Wt 268 lb 9.6 oz (121.836 kg)  BMI 36.42 kg/m2  SpO2 93%  History of Present Illness Christopher Holder is a 82 y.o. male for evaluation of sleep problems.  I last saw him while he was in hospital in December.  At that time he had pneumonia and hemoptysis.  He has since had f/u CT chest which shows post inflammatory fibrosis.  He has occasional cough with clear sputum.  He is not having fever, wheeze, chest pain, or hemoptysis.  He needs new supplies for his CPAP.  He had sleep testing done while living in [place] years ago >> he had CPAP titration done in [place] 2 yrs ago and recommend CPAP 15 cm H2O.  He needs new DME in [place].   Physical Exam:  General - No distress ENT - No sinus tenderness, no oral exudate Cardiac - s1s2 regular, no murmur Chest -  decreased BS at bases, no wheeze Back - No focal tenderness Abd - Soft, non-tender Ext - 1+ edema Neuro - Normal strength Skin - No rashes Psych - Normal mood, and behavior   Assessment/plan:  Obstructive sleep apnea. - continue CPAP 15 cm H2O - will arrange for new CPAP supply company - will get recent CPAP download - explained he might need repeat sleep study for insurance purposes  Asthma. 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 - monitor clinically  Systolic CHF. - f/u with cardiology   Patient Instructions  Will try to get  new CPAP supplies set up through new CPAP company  Can cancel follow up with Dr. Lamonte Sakai next week  Follow up with Dr. Halford Chessman in 4 months     Christopher Holder, M.D. Pager [phone] 01/25/2016, 10:23 AM

## 2016-01-26 ENCOUNTER — Telehealth: Payer: Self-pay | Admitting: Pulmonary Disease

## 2016-01-26 ENCOUNTER — Encounter: Payer: Self-pay | Admitting: Podiatry

## 2016-01-26 ENCOUNTER — Telehealth: Payer: Self-pay | Admitting: Medical

## 2016-01-26 ENCOUNTER — Ambulatory Visit (INDEPENDENT_AMBULATORY_CARE_PROVIDER_SITE_OTHER): Payer: Medicare Other | Admitting: Podiatry

## 2016-01-26 VITALS — BP 111/70 | HR 85

## 2016-01-26 DIAGNOSIS — I5022 Chronic systolic (congestive) heart failure: Secondary | ICD-10-CM | POA: Diagnosis not present

## 2016-01-26 DIAGNOSIS — E0842 Diabetes mellitus due to underlying condition with diabetic polyneuropathy: Secondary | ICD-10-CM

## 2016-01-26 DIAGNOSIS — M6281 Muscle weakness (generalized): Secondary | ICD-10-CM | POA: Diagnosis not present

## 2016-01-26 DIAGNOSIS — M79673 Pain in unspecified foot: Secondary | ICD-10-CM

## 2016-01-26 DIAGNOSIS — I482 Chronic atrial fibrillation: Secondary | ICD-10-CM | POA: Diagnosis not present

## 2016-01-26 DIAGNOSIS — B351 Tinea unguium: Secondary | ICD-10-CM | POA: Diagnosis not present

## 2016-01-26 DIAGNOSIS — R262 Difficulty in walking, not elsewhere classified: Secondary | ICD-10-CM | POA: Diagnosis not present

## 2016-01-26 DIAGNOSIS — E785 Hyperlipidemia, unspecified: Secondary | ICD-10-CM | POA: Diagnosis not present

## 2016-01-26 DIAGNOSIS — K219 Gastro-esophageal reflux disease without esophagitis: Secondary | ICD-10-CM | POA: Diagnosis not present

## 2016-01-26 DIAGNOSIS — Z7982 Long term (current) use of aspirin: Secondary | ICD-10-CM | POA: Diagnosis not present

## 2016-01-26 DIAGNOSIS — Z7984 Long term (current) use of oral hypoglycemic drugs: Secondary | ICD-10-CM | POA: Diagnosis not present

## 2016-01-26 DIAGNOSIS — M2040 Other hammer toe(s) (acquired), unspecified foot: Secondary | ICD-10-CM | POA: Diagnosis not present

## 2016-01-26 DIAGNOSIS — G4733 Obstructive sleep apnea (adult) (pediatric): Secondary | ICD-10-CM | POA: Diagnosis not present

## 2016-01-26 DIAGNOSIS — N183 Chronic kidney disease, stage 3 (moderate): Secondary | ICD-10-CM | POA: Diagnosis not present

## 2016-01-26 DIAGNOSIS — J449 Chronic obstructive pulmonary disease, unspecified: Secondary | ICD-10-CM | POA: Diagnosis not present

## 2016-01-26 DIAGNOSIS — E1140 Type 2 diabetes mellitus with diabetic neuropathy, unspecified: Secondary | ICD-10-CM | POA: Diagnosis not present

## 2016-01-26 DIAGNOSIS — E1122 Type 2 diabetes mellitus with diabetic chronic kidney disease: Secondary | ICD-10-CM | POA: Diagnosis not present

## 2016-01-26 DIAGNOSIS — M79606 Pain in leg, unspecified: Secondary | ICD-10-CM

## 2016-01-26 NOTE — Patient Instructions (Signed)
Seen for hypertrophic nails and diabetic shoes. All nails debrided. Both feet measured for diabetic shoes. Compounding cream ordered for nerve pain on both feet. Return in 3 months or as needed.

## 2016-01-26 NOTE — Telephone Encounter (Signed)
Christopher Holder - [name]    Family says that pt is okay with Placement for a facility. She need PCP to put in orders .    CB: [phone]

## 2016-01-26 NOTE — Telephone Encounter (Signed)
Spoke with pt and gave lab results from TP. Nothing further needed. Not sure what the other notes in this encounter are for.

## 2016-01-26 NOTE — Telephone Encounter (Signed)
When form sent will start to work on.

## 2016-01-26 NOTE — Telephone Encounter (Signed)
Spoke with Christopher Holder and she states that she is going to get an order from a Education officer, museum to get the process started and Percell Miller will need to fill out an FL2 form face to face with the provider. Christopher Holder states there is no other forms needed except the FL2.

## 2016-01-26 NOTE — Telephone Encounter (Signed)
Please advise 

## 2016-01-26 NOTE — Progress Notes (Signed)
Quick Note:  LVM for pt to return call ______ 

## 2016-01-26 NOTE — Progress Notes (Signed)
SUBJECTIVE: 82 y.o. year old male accompanied by his daughter presents for diabetic foot care. Nails are thick and hurt to walk. Also having pain from callus under the ball of right foot.  He is not taking Gabapentin since the medication requires higher dose and his kidney could not handle the dose. He is having difficulty sleeping at night due to pain in both feet. Patient points anterior and mid foot dorsum of both feet being the are of pain.   OBJECTIVE: DERMATOLOGIC EXAMINATION: Dystrophic and hypertrophic nails x 10. Painful callus under 3rd MPJ area right foot.  VASCULAR EXAMINATION OF LOWER LIMBS: Pedal pulses: All pedal pulses are palpable with normal pulsation.  Positive of mild pedal edema left foot.  Temperature gradient from tibial crest to dorsum of foot is within normal bilateral. NEUROLOGIC EXAMINATION OF THE LOWER LIMBS: Neuropathic foot pain both feet dorsum and anterior ankle.  MUSCULOSKELETAL EXAMINATION: Contracted 5th digit with digital corn at distal lateral right foot. Plantar flexed 3rd metatarsal right.  ASSESSMENT: Thick dystrophic nail plate symptomatic.  HT deformity 5th right. Plantar flexed 3rd metatarsal right with painful plantar callus. NIDDM under control. Diabetic neuropathy.   PLAN: Reviewed findings and available treatment options. All nails debrided  Compounding cream prescribed for nerve pain.  Return in 3 months or sooner if needed

## 2016-01-26 NOTE — Telephone Encounter (Signed)
I thought I put in social service consult. Will they send order over for me to sign for placement. Not sure how to do this in epic.

## 2016-01-27 ENCOUNTER — Telehealth (HOSPITAL_COMMUNITY): Payer: Self-pay | Admitting: Surgery

## 2016-01-27 ENCOUNTER — Telehealth: Payer: Self-pay | Admitting: Medical

## 2016-01-27 ENCOUNTER — Telehealth (HOSPITAL_COMMUNITY): Payer: Self-pay | Admitting: Vascular Surgery

## 2016-01-27 DIAGNOSIS — N183 Chronic kidney disease, stage 3 (moderate): Secondary | ICD-10-CM | POA: Diagnosis not present

## 2016-01-27 DIAGNOSIS — Z7982 Long term (current) use of aspirin: Secondary | ICD-10-CM | POA: Diagnosis not present

## 2016-01-27 DIAGNOSIS — I5022 Chronic systolic (congestive) heart failure: Secondary | ICD-10-CM | POA: Diagnosis not present

## 2016-01-27 DIAGNOSIS — I482 Chronic atrial fibrillation: Secondary | ICD-10-CM | POA: Diagnosis not present

## 2016-01-27 DIAGNOSIS — G4733 Obstructive sleep apnea (adult) (pediatric): Secondary | ICD-10-CM | POA: Diagnosis not present

## 2016-01-27 DIAGNOSIS — M6281 Muscle weakness (generalized): Secondary | ICD-10-CM | POA: Diagnosis not present

## 2016-01-27 DIAGNOSIS — J449 Chronic obstructive pulmonary disease, unspecified: Secondary | ICD-10-CM | POA: Diagnosis not present

## 2016-01-27 DIAGNOSIS — K219 Gastro-esophageal reflux disease without esophagitis: Secondary | ICD-10-CM | POA: Diagnosis not present

## 2016-01-27 DIAGNOSIS — E785 Hyperlipidemia, unspecified: Secondary | ICD-10-CM | POA: Diagnosis not present

## 2016-01-27 DIAGNOSIS — R262 Difficulty in walking, not elsewhere classified: Secondary | ICD-10-CM | POA: Diagnosis not present

## 2016-01-27 DIAGNOSIS — E1122 Type 2 diabetes mellitus with diabetic chronic kidney disease: Secondary | ICD-10-CM | POA: Diagnosis not present

## 2016-01-27 DIAGNOSIS — Z7984 Long term (current) use of oral hypoglycemic drugs: Secondary | ICD-10-CM | POA: Diagnosis not present

## 2016-01-27 DIAGNOSIS — E1140 Type 2 diabetes mellitus with diabetic neuropathy, unspecified: Secondary | ICD-10-CM | POA: Diagnosis not present

## 2016-01-27 MED ORDER — POTASSIUM CHLORIDE ER 20 MEQ PO TBCR: 40.0000 meq | EXTENDED_RELEASE_TABLET | Freq: Two times a day (BID) | ORAL | Status: DC

## 2016-01-27 NOTE — Telephone Encounter (Signed)
Caller name: Anderson Malta, PT with [name] Can be reached: [phone]   Reason for call: Anderson Malta, PT in home with the pt, states pt has had 2 lb weight loss in 1 week and exhibiting SOB today. Transferred to [facility] with [facility].

## 2016-01-27 NOTE — Telephone Encounter (Signed)
[facility] Day - Client TELEPHONE ADVICE RECORD [facility]  Patient Name: Christopher Holder  DOB: 1929/10/29    Initial Comment Caller states patient has had a 2 lb weight loss, having SOB.   Nurse Assessment  Nurse: Wayne Sever, RN, Tillie Rung Date/Time (Eastern Time): 01/27/2016 11:22:37 AM  Confirm and document reason for call. If symptomatic, describe symptoms. You must click the next button to save text entered. ---Caller states she is home PT and he is more short of breath today. She states just talking makes him feel winded.  Has the patient traveled out of the country within the last 30 days? ---Not Applicable  Does the patient have any new or worsening symptoms? ---Yes  Will a triage be completed? ---Yes  Related visit to physician within the last 2 weeks? ---No  Does the PT have any chronic conditions? (i.e. diabetes, asthma, etc.) ---Yes  List chronic conditions. ---COPD, CHF  Is this a behavioral health or substance abuse call? ---No     Guidelines    Guideline Title Affirmed Question Affirmed Notes  Cough - Chronic [1] Increasing difficulty breathing AND [2] always has some difficulty breathing    Final Disposition User   Go to ED Now (or PCP triage) Wayne Sever, RN, Tillie Rung    Comments  Caller refused ED outcome, says he has had this problem since November   Referrals  [place] UNDECIDED   Disagree/Comply: Disagree  Disagree/Comply Reason: Disagree with instructions

## 2016-01-27 NOTE — Telephone Encounter (Signed)
Patient's granddaughter called and was concerned about her grandfather.  She did not get a response on the triage line and therefore came down to clinic.  I came out and discussed her concerns.  She tells me that her grandfather is more SOB today. He does have some increased LE swelling as well.  His weight was only up a pound from yesterday according to her.  After reviewing chart and consulting with Ileene Patrick Pharm D--Patient's granddaughter was advised to have him take an additional 20 mg Lasix tonite and additional 20 meq of Potassium.  He is also to resume home dose of Potassium 40 meq bid.  If his SOB increases and/or he does not feel better tomorrow I asked her to call us back.  She was agreeable to plan.

## 2016-01-27 NOTE — Telephone Encounter (Signed)
Form was signed by E. [name] PA-C and was faxed to [facility]. 01/27/16. Received a fax confirmation 01/27/16 from [facility].

## 2016-01-27 NOTE — Telephone Encounter (Signed)
Take glucotrol only. DC glimepiride. Not sure when glimepiride written on med review.

## 2016-01-27 NOTE — Telephone Encounter (Signed)
Pt granddaughter called again, she is irritated that no one has called her back about her grandfather, I told her if it is urgent please take him to the ER, his weight is up 1lb and he is SOB

## 2016-01-27 NOTE — Telephone Encounter (Signed)
Pt granddaughter called pt weight is up 1 pound, he is SOB .Marland Kitchen PLEASE ADVISE

## 2016-01-27 NOTE — Progress Notes (Signed)
Quick Note:  Called spoke with pt. Reviewed results and recs. Pt voiced understanding and had no further questions. ______ 

## 2016-01-28 ENCOUNTER — Other Ambulatory Visit: Payer: Self-pay | Admitting: *Deleted

## 2016-01-28 NOTE — Patient Outreach (Signed)
[facility]) Care Management   01/28/2016  Christopher Holder 1929-11-29 [id number]  Christopher Holder is an 82 y.o. male Pt remains receptive to enrollment into the [facility] program and services. Subjective:  HF: Pt familiar with HF but not the signs/symptoms and HF zones. Pt reports weight and documenting all his weights but not sure why but knows its importance.  Pt able to reports his weight today at 254.9 lbs, yesterday 257.5 and last week was 259.9 with no encountered symptoms.  RESPIRATORY: Pt reports coughing with yellow sputum but denies any fevers, chills or GI issues. States he easy to "get up" and this is not all the time that this occurs. Denies any SOB or breathing issues related to this productive cough.  SWELLING: Pt reports she does not like to wear the compression stocking but has a pair provider to him by the [facility] agency and as been encouraged to wear. Other remedies pt reports is elevating his legs. Pt states he has a history of swelling to his lower legs during the day but resolved prior to morning upon arrising.  LEVEL OF CARE: Pt reports he is currently liquidating his asset with plans to go to a assisted living facility and has submitted his MCD application. Pt has not choosen a facility awaiting approval in no rush but eventually this is his plan. Currently pt confirms his grand-daughter is staying with him however she works during the day.  Pt reports [facility] continues to visit with PT twice weekly, OT twice weekly and RN once weekly from [place].  Pt reports having an active emergency alert system with "[facility] [employer]" throughout the day when he is home alone. Reports his grand-daughter lives with him but works during the day.  Objective:   Review of Systems  Constitutional: Negative.   HENT: Negative.   Eyes: Negative.   Respiratory: Positive for sputum production.        Productive cough (yellow in discoloration) with no fevers or chills.   Cardiovascular: Positive for  leg swelling.       Bialteral edema noted at 2-3+ to lower legs  Gastrointestinal: Negative.   Genitourinary: Negative.   Musculoskeletal: Negative.   Skin: Negative.   Neurological: Negative.   Psychiatric/Behavioral: Negative.     Physical Exam  Constitutional: He is oriented to person, place, and time. He appears well-developed and well-nourished.  HENT:  Right Ear: External ear normal.  Left Ear: External ear normal.  Eyes: EOM are normal.  Neck: Normal range of motion.  Cardiovascular: Normal heart sounds.   Respiratory: Effort normal and breath sounds normal.  GI: Soft. Bowel sounds are normal.  Musculoskeletal: Normal range of motion.  Neurological: He is alert and oriented to person, place, and time.  Skin: Skin is warm and dry.  Psychiatric: He has a normal mood and affect. His behavior is normal. Judgment and thought content normal.    Encounter Medications:   Outpatient Encounter Prescriptions as of 01/28/2016  Medication Sig  . albuterol (PROVENTIL HFA;VENTOLIN HFA) 108 (90 BASE) MCG/ACT inhaler Inhale 2 puffs into the lungs every 4 (four) hours as needed for wheezing or shortness of breath.   . allopurinol (ZYLOPRIM) 100 MG tablet Take 1 tablet by mouth two  times daily  . carvedilol (COREG) 3.125 MG tablet 1 tab po bid  . DULERA 200-5 MCG/ACT AERO Use 2 puffs two times daily  . ELIQUIS 5 MG TABS tablet Take 1 tablet (5 mg total) by mouth 2 (two) times daily.  Marland Kitchen  fluticasone (FLONASE) 50 MCG/ACT nasal spray Use 2 sprays in each  nostril daily  . furosemide (LASIX) 20 MG tablet Take 80 mg by mouth every evening.   . furosemide (LASIX) 40 MG tablet Take 2 tablets (80 mg total) by mouth daily. In the AM  . glipiZIDE (GLUCOTROL) 5 MG tablet Take 1 tablet (5 mg total) by mouth 2 (two) times daily before a meal. Reported on 12/29/2015  . losartan (COZAAR) 25 MG tablet Take 0.5 tablets (12.5 mg total) by mouth daily.  . montelukast (SINGULAIR) 10 MG tablet Take 1 tablet by  mouth at  bedtime  . NON FORMULARY Take 15 each by mouth Nightly. CPAP at night  . omeprazole (PRILOSEC) 20 MG capsule Take 1 capsule by mouth  daily  . potassium chloride 20 MEQ TBCR Take 40 mEq by mouth 2 (two) times daily. (Patient taking differently: Take 10 mEq by mouth QID. )  . simvastatin (ZOCOR) 20 MG tablet Take 1 tablet by mouth  daily  . tiotropium (SPIRIVA) 18 MCG inhalation capsule Place 1 capsule (18 mcg total) into inhaler and inhale daily.  . traZODone (DESYREL) 50 MG tablet 1/2 tab po q hs   No facility-administered encounter medications on file as of 01/28/2016.    Functional Status:   In your present state of health, do you have any difficulty performing the following activities: 01/28/2016 12/16/2015  Hearing? Y N  Vision? N N  Difficulty concentrating or making decisions? Y N  Walking or climbing stairs? Y N  Dressing or bathing? N N  Doing errands, shopping? N Y  Conservation officer, nature and eating ? N -  Using the Toilet? N -  In the past six months, have you accidently leaked urine? N -  Do you have problems with loss of bowel control? N -  Managing your Medications? Y -  Managing your Finances? Y -  Housekeeping or managing your Housekeeping? N -   BP 120/56 mmHg  Pulse 102  Resp 20  Ht 1.829 m (6')  Wt 254 lb 14.4 oz (115.622 kg)  BMI 34.56 kg/m2  SpO2 94%.  Fall/Depression Screening:    PHQ 2/9 Scores 01/28/2016 01/12/2016 05/27/2015  PHQ - 2 Score 0 0 0  Exception Documentation - - Patient refusal    Assessment:   Introduction to the [facility] program and services related to enrollment Case management related to HF Productive cough related discoloration of sputum Bilateral edema related to lower extremities Level of care related to possible placement  Plan:  Will obtain a signed consent form and completed a physical assessment with noted issues and findings.  Will discussed HF detailing the HF zones and what to do if acute symptoms should occur. Provided  printable information that was reviewed and given to pt for increasing his knowledge regarding HF. Program provided HF: Keeping track of our weight each day, HF: How to be salt smart and HF: When to call your doctor or 911. Will prepare pt for teach-back method on what was covered today. RN will also provide [facility] calendar and review information concerning available community resources and contact number on needed referrals. Will verify pt is in the GREEN zone however always with minimal lower legs swelling but no problems breathing and weight is being maintained with today 254.9, yesterday 257.5 lbs and last week at 259.9 lbs. Will confirm pt remains with the HF Clinic Dr. Haroldine Laws and Dr. Marlou Porch (CAD) Will verify no fevers, chills or flu-like symptoms and encourage on OTC  Mucinex to assist with his ongoing secretions if needed. Will also encouraged pt to seek medical attention with any percipating symptoms  Of infection to his primary provider (pt with understanding).  Will strongly encouraged pt to elevate his lower legs and discussed compression stockings. Initially pt indicated he could not get the TEDs office however decided to try the compression stockings once again and requested this RN to apply his stockings provided by the [facility] agency. Will apply the compression stockings and request pt to use only when mobile and remove at night.  RN requested pt request his grand-daguhter or one of the [facility] providers with [name] to remove or apply based upon the home visit but attempt to apply when he encounters swelling.  Will discussed level of care issues being that pt currently pending MCD application and liquidating some of his assets with the help of his caregiver. Confirmed pt currently receiving [facility] with [name] for PT/OT twice weekly and RN once weekly. Will also verify pt has a medical alert system based upon him being alone throughout the day [employer]). Verifed pt has contacts immediately  available if his is in an acute states for assistance needed. Also provided community resources for completing his MCD application for the local office for future inquiries. RN encourage pt to view the Senior Living Booklet for potentia locations of Assisted Living facilities in the area. Pt aware [facility] office has several resources for more facilities in the county. Discussed the process for preparing to size down to a smaller apartment or room to accomondate his care in a smaller community of seniors. Plan of care discussed alone with goals as pt receptive and feels this is something he can work on for improving his health. Will schedule next home visit and continue to re-evaluate pt's progress accordingly.  Raina Mina, RN Care Management Coordinator [place] Office [phone]

## 2016-01-29 DIAGNOSIS — E1122 Type 2 diabetes mellitus with diabetic chronic kidney disease: Secondary | ICD-10-CM | POA: Diagnosis not present

## 2016-01-29 DIAGNOSIS — I482 Chronic atrial fibrillation: Secondary | ICD-10-CM | POA: Diagnosis not present

## 2016-01-29 DIAGNOSIS — M6281 Muscle weakness (generalized): Secondary | ICD-10-CM | POA: Diagnosis not present

## 2016-01-29 DIAGNOSIS — I5022 Chronic systolic (congestive) heart failure: Secondary | ICD-10-CM | POA: Diagnosis not present

## 2016-01-29 DIAGNOSIS — Z7982 Long term (current) use of aspirin: Secondary | ICD-10-CM | POA: Diagnosis not present

## 2016-01-29 DIAGNOSIS — G4733 Obstructive sleep apnea (adult) (pediatric): Secondary | ICD-10-CM | POA: Diagnosis not present

## 2016-01-29 DIAGNOSIS — E1140 Type 2 diabetes mellitus with diabetic neuropathy, unspecified: Secondary | ICD-10-CM | POA: Diagnosis not present

## 2016-01-29 DIAGNOSIS — Z7984 Long term (current) use of oral hypoglycemic drugs: Secondary | ICD-10-CM | POA: Diagnosis not present

## 2016-01-29 DIAGNOSIS — K219 Gastro-esophageal reflux disease without esophagitis: Secondary | ICD-10-CM | POA: Diagnosis not present

## 2016-01-29 DIAGNOSIS — R262 Difficulty in walking, not elsewhere classified: Secondary | ICD-10-CM | POA: Diagnosis not present

## 2016-01-29 DIAGNOSIS — J449 Chronic obstructive pulmonary disease, unspecified: Secondary | ICD-10-CM | POA: Diagnosis not present

## 2016-01-29 DIAGNOSIS — E785 Hyperlipidemia, unspecified: Secondary | ICD-10-CM | POA: Diagnosis not present

## 2016-01-29 DIAGNOSIS — N183 Chronic kidney disease, stage 3 (moderate): Secondary | ICD-10-CM | POA: Diagnosis not present

## 2016-01-29 NOTE — Telephone Encounter (Signed)
Received a call from [place] at [facility] and she wanted clarficaition on the medication form that was sent to the pharmacy on 01/27/16. I advised her that per the last note from E. [name] that the pt was only supposed to be on Glucotrol and that the glimepride was D/C'D per E. [name]. She updated the changes in the system.

## 2016-02-01 ENCOUNTER — Telehealth (HOSPITAL_COMMUNITY): Payer: Self-pay | Admitting: *Deleted

## 2016-02-01 DIAGNOSIS — E785 Hyperlipidemia, unspecified: Secondary | ICD-10-CM | POA: Diagnosis not present

## 2016-02-01 DIAGNOSIS — J449 Chronic obstructive pulmonary disease, unspecified: Secondary | ICD-10-CM | POA: Diagnosis not present

## 2016-02-01 DIAGNOSIS — I482 Chronic atrial fibrillation: Secondary | ICD-10-CM | POA: Diagnosis not present

## 2016-02-01 DIAGNOSIS — K219 Gastro-esophageal reflux disease without esophagitis: Secondary | ICD-10-CM | POA: Diagnosis not present

## 2016-02-01 DIAGNOSIS — Z7982 Long term (current) use of aspirin: Secondary | ICD-10-CM | POA: Diagnosis not present

## 2016-02-01 DIAGNOSIS — Z7984 Long term (current) use of oral hypoglycemic drugs: Secondary | ICD-10-CM | POA: Diagnosis not present

## 2016-02-01 DIAGNOSIS — N183 Chronic kidney disease, stage 3 (moderate): Secondary | ICD-10-CM | POA: Diagnosis not present

## 2016-02-01 DIAGNOSIS — I5022 Chronic systolic (congestive) heart failure: Secondary | ICD-10-CM | POA: Diagnosis not present

## 2016-02-01 DIAGNOSIS — M6281 Muscle weakness (generalized): Secondary | ICD-10-CM | POA: Diagnosis not present

## 2016-02-01 DIAGNOSIS — E1122 Type 2 diabetes mellitus with diabetic chronic kidney disease: Secondary | ICD-10-CM | POA: Diagnosis not present

## 2016-02-01 DIAGNOSIS — E1140 Type 2 diabetes mellitus with diabetic neuropathy, unspecified: Secondary | ICD-10-CM | POA: Diagnosis not present

## 2016-02-01 DIAGNOSIS — G4733 Obstructive sleep apnea (adult) (pediatric): Secondary | ICD-10-CM | POA: Diagnosis not present

## 2016-02-01 DIAGNOSIS — R262 Difficulty in walking, not elsewhere classified: Secondary | ICD-10-CM | POA: Diagnosis not present

## 2016-02-01 MED ORDER — POTASSIUM CHLORIDE ER 10 MEQ PO TBCR: 10.0000 meq | EXTENDED_RELEASE_TABLET | Freq: Three times a day (TID) | ORAL | Status: DC

## 2016-02-01 NOTE — Telephone Encounter (Signed)
Received call from [facility] the order they received from our office on pt's KCL dose, order was sent in 40 meq BID, however pt had been getting 10 meq BID.  Since it is a large increase they wanted to verify dose.  Upon review of chart it is very confusing what dose pt should be on.  Our office notes are stating 40 BID, although unsure when that was ordered as some notes state 20 meq once.  PCP has 10 meq daily then appears to increase due to lab results earlier this month.  Called and spoke w/pt himself.  He states he is taking 10 meq tabs TID and has been doing this for a couple of weeks, he also states he is taking Losartan 25 mg 1/2 tab daily.  Pt seems to be knowledgeable regarding med doses and frequency.  Discussed w/Erika, PharmD she recommends pt continue taking his current dose 10 meq TID for now and recheck labs at appt next week.  Called [facility] and gave VO for KCL 10 meq TID, pt aware to continue taking that dose

## 2016-02-02 ENCOUNTER — Observation Stay (HOSPITAL_COMMUNITY)
Admission: EM | Admit: 2016-02-02 | Discharge: 2016-02-05 | Disposition: A | Discharge status: Skilled Nursing Facility | Payer: Medicare Other | Attending: Internal Medicine | Admitting: Internal Medicine

## 2016-02-02 ENCOUNTER — Encounter (HOSPITAL_COMMUNITY): Payer: Self-pay

## 2016-02-02 ENCOUNTER — Emergency Department (HOSPITAL_COMMUNITY): Payer: Medicare Other

## 2016-02-02 DIAGNOSIS — N183 Chronic kidney disease, stage 3 (moderate): Secondary | ICD-10-CM | POA: Diagnosis not present

## 2016-02-02 DIAGNOSIS — E1140 Type 2 diabetes mellitus with diabetic neuropathy, unspecified: Secondary | ICD-10-CM | POA: Diagnosis not present

## 2016-02-02 DIAGNOSIS — Z96659 Presence of unspecified artificial knee joint: Secondary | ICD-10-CM | POA: Insufficient documentation

## 2016-02-02 DIAGNOSIS — I481 Persistent atrial fibrillation: Secondary | ICD-10-CM | POA: Insufficient documentation

## 2016-02-02 DIAGNOSIS — Z7982 Long term (current) use of aspirin: Secondary | ICD-10-CM | POA: Diagnosis not present

## 2016-02-02 DIAGNOSIS — I110 Hypertensive heart disease with heart failure: Secondary | ICD-10-CM | POA: Diagnosis not present

## 2016-02-02 DIAGNOSIS — I130 Hypertensive heart and chronic kidney disease with heart failure and stage 1 through stage 4 chronic kidney disease, or unspecified chronic kidney disease: Secondary | ICD-10-CM | POA: Diagnosis not present

## 2016-02-02 DIAGNOSIS — I2510 Atherosclerotic heart disease of native coronary artery without angina pectoris: Secondary | ICD-10-CM

## 2016-02-02 DIAGNOSIS — G4733 Obstructive sleep apnea (adult) (pediatric): Secondary | ICD-10-CM | POA: Insufficient documentation

## 2016-02-02 DIAGNOSIS — R262 Difficulty in walking, not elsewhere classified: Secondary | ICD-10-CM | POA: Diagnosis not present

## 2016-02-02 DIAGNOSIS — I480 Paroxysmal atrial fibrillation: Secondary | ICD-10-CM | POA: Insufficient documentation

## 2016-02-02 DIAGNOSIS — J45909 Unspecified asthma, uncomplicated: Secondary | ICD-10-CM | POA: Diagnosis not present

## 2016-02-02 DIAGNOSIS — E785 Hyperlipidemia, unspecified: Secondary | ICD-10-CM | POA: Insufficient documentation

## 2016-02-02 DIAGNOSIS — Z882 Allergy status to sulfonamides status: Secondary | ICD-10-CM | POA: Diagnosis not present

## 2016-02-02 DIAGNOSIS — I5022 Chronic systolic (congestive) heart failure: Secondary | ICD-10-CM | POA: Diagnosis present

## 2016-02-02 DIAGNOSIS — J449 Chronic obstructive pulmonary disease, unspecified: Secondary | ICD-10-CM | POA: Diagnosis not present

## 2016-02-02 DIAGNOSIS — E119 Type 2 diabetes mellitus without complications: Secondary | ICD-10-CM

## 2016-02-02 DIAGNOSIS — R0609 Other forms of dyspnea: Secondary | ICD-10-CM | POA: Diagnosis present

## 2016-02-02 DIAGNOSIS — M6281 Muscle weakness (generalized): Secondary | ICD-10-CM | POA: Diagnosis not present

## 2016-02-02 DIAGNOSIS — R05 Cough: Secondary | ICD-10-CM | POA: Diagnosis not present

## 2016-02-02 DIAGNOSIS — K219 Gastro-esophageal reflux disease without esophagitis: Secondary | ICD-10-CM | POA: Insufficient documentation

## 2016-02-02 DIAGNOSIS — I252 Old myocardial infarction: Secondary | ICD-10-CM | POA: Insufficient documentation

## 2016-02-02 DIAGNOSIS — R0602 Shortness of breath: Secondary | ICD-10-CM | POA: Diagnosis not present

## 2016-02-02 DIAGNOSIS — Z7901 Long term (current) use of anticoagulants: Secondary | ICD-10-CM | POA: Insufficient documentation

## 2016-02-02 DIAGNOSIS — Z7984 Long term (current) use of oral hypoglycemic drugs: Secondary | ICD-10-CM | POA: Diagnosis not present

## 2016-02-02 DIAGNOSIS — Z885 Allergy status to narcotic agent status: Secondary | ICD-10-CM | POA: Diagnosis not present

## 2016-02-02 DIAGNOSIS — J9601 Acute respiratory failure with hypoxia: Secondary | ICD-10-CM

## 2016-02-02 DIAGNOSIS — J8410 Pulmonary fibrosis, unspecified: Secondary | ICD-10-CM | POA: Diagnosis present

## 2016-02-02 DIAGNOSIS — I159 Secondary hypertension, unspecified: Secondary | ICD-10-CM | POA: Diagnosis not present

## 2016-02-02 DIAGNOSIS — I5043 Acute on chronic combined systolic (congestive) and diastolic (congestive) heart failure: Secondary | ICD-10-CM | POA: Insufficient documentation

## 2016-02-02 DIAGNOSIS — J9620 Acute and chronic respiratory failure, unspecified whether with hypoxia or hypercapnia: Secondary | ICD-10-CM | POA: Diagnosis not present

## 2016-02-02 DIAGNOSIS — I482 Chronic atrial fibrillation: Secondary | ICD-10-CM | POA: Diagnosis not present

## 2016-02-02 DIAGNOSIS — I447 Left bundle-branch block, unspecified: Secondary | ICD-10-CM | POA: Insufficient documentation

## 2016-02-02 DIAGNOSIS — I4819 Other persistent atrial fibrillation: Secondary | ICD-10-CM | POA: Diagnosis present

## 2016-02-02 DIAGNOSIS — I10 Essential (primary) hypertension: Secondary | ICD-10-CM | POA: Diagnosis present

## 2016-02-02 DIAGNOSIS — E1122 Type 2 diabetes mellitus with diabetic chronic kidney disease: Secondary | ICD-10-CM | POA: Diagnosis not present

## 2016-02-02 DIAGNOSIS — R0600 Dyspnea, unspecified: Secondary | ICD-10-CM | POA: Diagnosis present

## 2016-02-02 HISTORY — DX: Pulmonary fibrosis, unspecified: J84.10

## 2016-02-02 HISTORY — DX: Unspecified osteoarthritis, unspecified site: M19.90

## 2016-02-02 LAB — CBC
HEMATOCRIT: 38.7 % — AB (ref 39.0–52.0)
HEMOGLOBIN: 12.7 g/dL — AB (ref 13.0–17.0)
MCH: 28.7 pg (ref 26.0–34.0)
MCHC: 32.8 g/dL (ref 30.0–36.0)
MCV: 87.4 fL (ref 78.0–100.0)
Platelets: 197 10*3/uL (ref 150–400)
RBC: 4.43 MIL/uL (ref 4.22–5.81)
RDW: 17.6 % — ABNORMAL HIGH (ref 11.5–15.5)
WBC: 6.2 10*3/uL (ref 4.0–10.5)

## 2016-02-02 LAB — I-STAT TROPONIN, ED
TROPONIN I, POC: 0.02 ng/mL (ref 0.00–0.08)
TROPONIN I, POC: 0.02 ng/mL (ref 0.00–0.08)

## 2016-02-02 LAB — BASIC METABOLIC PANEL
ANION GAP: 11 (ref 5–15)
BUN: 18 mg/dL (ref 6–20)
CALCIUM: 9 mg/dL (ref 8.9–10.3)
CO2: 26 mmol/L (ref 22–32)
Chloride: 100 mmol/L — ABNORMAL LOW (ref 101–111)
Creatinine, Ser: 1.48 mg/dL — ABNORMAL HIGH (ref 0.61–1.24)
GFR, EST AFRICAN AMERICAN: 49 mL/min — AB (ref >60)
GFR, EST NON AFRICAN AMERICAN: 42 mL/min — AB (ref >60)
Glucose, Bld: 109 mg/dL — ABNORMAL HIGH (ref 65–99)
POTASSIUM: 3.2 mmol/L — AB (ref 3.5–5.1)
Sodium: 137 mmol/L (ref 135–145)

## 2016-02-02 LAB — GLUCOSE, CAPILLARY: Glucose-Capillary: 174 mg/dL — ABNORMAL HIGH (ref 65–99)

## 2016-02-02 LAB — BRAIN NATRIURETIC PEPTIDE: B NATRIURETIC PEPTIDE 5: 604.4 pg/mL — AB (ref 0.0–100.0)

## 2016-02-02 MED ORDER — TIOTROPIUM BROMIDE MONOHYDRATE 18 MCG IN CAPS
18.0000 ug | ORAL_CAPSULE | Freq: Every day | RESPIRATORY_TRACT | Status: DC
  Administered 2016-02-03 – 2016-02-05 (×3): 18 ug via RESPIRATORY_TRACT
  Filled 2016-02-02: qty 5

## 2016-02-02 MED ORDER — SODIUM CHLORIDE 0.9 % IV SOLN: 250.0000 mL | INTRAVENOUS | Status: DC | PRN

## 2016-02-02 MED ORDER — SIMVASTATIN 20 MG PO TABS
20.0000 mg | ORAL_TABLET | Freq: Every day | ORAL | Status: DC
  Administered 2016-02-03 – 2016-02-04 (×2): 20 mg via ORAL
  Filled 2016-02-02 (×3): qty 1

## 2016-02-02 MED ORDER — APIXABAN 5 MG PO TABS
5.0000 mg | ORAL_TABLET | Freq: Two times a day (BID) | ORAL | Status: DC
  Administered 2016-02-02 – 2016-02-05 (×6): 5 mg via ORAL
  Filled 2016-02-02 (×6): qty 1

## 2016-02-02 MED ORDER — ACETAMINOPHEN 325 MG PO TABS
650.0000 mg | ORAL_TABLET | ORAL | Status: DC | PRN
  Administered 2016-02-03: 650 mg via ORAL
  Filled 2016-02-02: qty 2

## 2016-02-02 MED ORDER — POTASSIUM CHLORIDE CRYS ER 10 MEQ PO TBCR
10.0000 meq | EXTENDED_RELEASE_TABLET | Freq: Three times a day (TID) | ORAL | Status: DC
  Administered 2016-02-02 – 2016-02-05 (×8): 10 meq via ORAL
  Filled 2016-02-02 (×10): qty 1

## 2016-02-02 MED ORDER — POTASSIUM CHLORIDE CRYS ER 20 MEQ PO TBCR
40.0000 meq | EXTENDED_RELEASE_TABLET | Freq: Once | ORAL | Status: AC
  Administered 2016-02-02: 40 meq via ORAL
  Filled 2016-02-02: qty 2

## 2016-02-02 MED ORDER — MONTELUKAST SODIUM 10 MG PO TABS
10.0000 mg | ORAL_TABLET | Freq: Every day | ORAL | Status: DC
Start: 2016-02-02 — End: 2016-02-05
  Administered 2016-02-02 – 2016-02-04 (×3): 10 mg via ORAL
  Filled 2016-02-02 (×3): qty 1

## 2016-02-02 MED ORDER — PANTOPRAZOLE SODIUM 40 MG PO TBEC
40.0000 mg | DELAYED_RELEASE_TABLET | Freq: Every day | ORAL | Status: DC
  Administered 2016-02-03 – 2016-02-05 (×3): 40 mg via ORAL
  Filled 2016-02-02 (×3): qty 1

## 2016-02-02 MED ORDER — SODIUM CHLORIDE 0.9% FLUSH
3.0000 mL | Freq: Two times a day (BID) | INTRAVENOUS | Status: DC
  Administered 2016-02-02 – 2016-02-04 (×5): 3 mL via INTRAVENOUS

## 2016-02-02 MED ORDER — TRAZODONE HCL 50 MG PO TABS
25.0000 mg | ORAL_TABLET | Freq: Every day | ORAL | Status: DC
  Administered 2016-02-02 – 2016-02-04 (×3): 25 mg via ORAL
  Filled 2016-02-02 (×3): qty 1

## 2016-02-02 MED ORDER — LOSARTAN POTASSIUM 25 MG PO TABS
12.5000 mg | ORAL_TABLET | Freq: Every day | ORAL | Status: DC
  Administered 2016-02-03 – 2016-02-05 (×3): 12.5 mg via ORAL
  Filled 2016-02-02 (×3): qty 1

## 2016-02-02 MED ORDER — FLUTICASONE PROPIONATE 50 MCG/ACT NA SUSP
2.0000 | Freq: Every day | NASAL | Status: DC
  Administered 2016-02-03 – 2016-02-05 (×3): 2 via NASAL
  Filled 2016-02-02: qty 16

## 2016-02-02 MED ORDER — FUROSEMIDE 20 MG PO TABS
60.0000 mg | ORAL_TABLET | ORAL | Status: DC
  Administered 2016-02-03 – 2016-02-04 (×2): 60 mg via ORAL
  Filled 2016-02-02 (×2): qty 3

## 2016-02-02 MED ORDER — INSULIN ASPART 100 UNIT/ML ~~LOC~~ SOLN
0.0000 [IU] | Freq: Three times a day (TID) | SUBCUTANEOUS | Status: DC
  Administered 2016-02-03: 3 [IU] via SUBCUTANEOUS
  Administered 2016-02-03: 2 [IU] via SUBCUTANEOUS
  Administered 2016-02-04: 3 [IU] via SUBCUTANEOUS
  Administered 2016-02-05: 2 [IU] via SUBCUTANEOUS

## 2016-02-02 MED ORDER — SODIUM CHLORIDE 0.9% FLUSH: 3.0000 mL | INTRAVENOUS | Status: DC | PRN

## 2016-02-02 MED ORDER — MOMETASONE FURO-FORMOTEROL FUM 200-5 MCG/ACT IN AERO
2.0000 | INHALATION_SPRAY | Freq: Two times a day (BID) | RESPIRATORY_TRACT | Status: DC
  Administered 2016-02-03 – 2016-02-05 (×5): 2 via RESPIRATORY_TRACT
  Filled 2016-02-02: qty 8.8

## 2016-02-02 MED ORDER — POTASSIUM CHLORIDE CRYS ER 20 MEQ PO TBCR: 40.0000 meq | EXTENDED_RELEASE_TABLET | Freq: Once | ORAL | Status: DC

## 2016-02-02 MED ORDER — ALLOPURINOL 100 MG PO TABS
100.0000 mg | ORAL_TABLET | Freq: Two times a day (BID) | ORAL | Status: DC
  Administered 2016-02-02 – 2016-02-05 (×6): 100 mg via ORAL
  Filled 2016-02-02 (×6): qty 1

## 2016-02-02 MED ORDER — ALBUTEROL SULFATE (2.5 MG/3ML) 0.083% IN NEBU: 3.0000 mL | INHALATION_SOLUTION | RESPIRATORY_TRACT | Status: DC | PRN

## 2016-02-02 MED ORDER — CARVEDILOL 3.125 MG PO TABS
3.1250 mg | ORAL_TABLET | Freq: Two times a day (BID) | ORAL | Status: DC
  Administered 2016-02-03 – 2016-02-05 (×5): 3.125 mg via ORAL
  Filled 2016-02-02 (×5): qty 1

## 2016-02-02 MED ORDER — FUROSEMIDE 20 MG PO TABS
80.0000 mg | ORAL_TABLET | Freq: Every day | ORAL | Status: DC
  Administered 2016-02-03 – 2016-02-05 (×3): 80 mg via ORAL
  Filled 2016-02-02 (×3): qty 4

## 2016-02-02 MED ORDER — FUROSEMIDE 20 MG PO TABS: 80.0000 mg | ORAL_TABLET | Freq: Two times a day (BID) | ORAL | Status: DC

## 2016-02-02 NOTE — Telephone Encounter (Signed)
I began working on form. If he is not in by Monday may call him.

## 2016-02-02 NOTE — Telephone Encounter (Signed)
Would you call pt and have him come in this week. I want to check on his status. Fill out fl2 form. Need 30 minute appointment. See if can come in on Friday. He might have problem coming in [place] might not be available to bring him in. Let me know what he says.

## 2016-02-02 NOTE — ED Notes (Signed)
Called pt daugther [phone]. Left voicemail letting her know which room pt went to

## 2016-02-02 NOTE — ED Notes (Signed)
Gave pt number to contact [facility] regarding the whereabouts of his walker

## 2016-02-02 NOTE — ED Notes (Signed)
Pt sitting quietly in wheelchair with daughter at side.  Respirations easy and unlabored.

## 2016-02-02 NOTE — ED Notes (Addendum)
To triage via EMS.  Pt reports since hospitalized Nov 2016 for pneumonia he has had cough and breathing problems since.  Breathing got worse today.  Pt talking in complete sentences but puts effort into talking d/t shortness of breath. No walker with patient.

## 2016-02-02 NOTE — ED Notes (Signed)
Patient was able to ambulate in the hallway with assistance of a walker.  Patient maintained O2 between 96-100% and a pulse initially at 96 and by the end of the walk was at 142HR.  We walked from [place] to the B/D MD office and back.  Patient was short of breath with exertion.

## 2016-02-02 NOTE — ED Provider Notes (Signed)
CSN: [id number]     Arrival date & time 02/02/16  1435 History   First MD Initiated Contact with Patient 02/02/16 1720     Chief Complaint  Patient presents with  . Shortness of Breath     (Consider location/radiation/quality/duration/timing/severity/associated sxs/prior Treatment) Patient is a 82 y.o. male presenting with general illness. The history is provided by the patient and medical records.  Illness Location:  Shortness of breath Severity:  Moderate Onset quality:  Unable to specify Duration:  6 months (Chronic) Timing:  Constant Progression:  Unchanged Chronicity:  Chronic Context:  History of systolic CHF with an ejection fraction of 20%, pulmonary fibrosis. No oxygen requirement at home. Persistence of shortness of breath over the last 6 months. Seen at the heart failure clinic. Presenting today for persistence of symptoms. Lives at home alone. Associated symptoms: shortness of breath (c/w baseline)   Associated symptoms: no abdominal pain, no chest pain, no cough, no diarrhea, no fever, no headaches, no nausea and no vomiting     Past Medical History  Diagnosis Date  . Diabetes mellitus without complication ([place])   . CHF (congestive heart failure) ([place])   . Coronary artery disease   . COPD (chronic obstructive pulmonary disease) ([place])   . Asthma   . Hypertension   . HOH (hard of hearing)   . Sleep apnea   . Myocardial infarction ([place])   . Hyperlipidemia 05/27/2015  . GERD (gastroesophageal reflux disease) 05/27/2015  . AKI (acute kidney injury) ([place]) 08/18/2015   Past Surgical History  Procedure Laterality Date  . Coronary angioplasty with stent placement    . Knee surgery    . Total knee arthroplasty      Knee replacement x2  . Cardioversion [name] 08/28/2015    Procedure: CARDIOVERSION;  Surgeon: Jerline Pain, MD;  Location: [place];  Service: Cardiovascular;  Laterality: [name];  . Cardiac catheterization [name] 10/02/2015    Procedure: Left Heart Cath and  Coronary Angiography;  Surgeon: Leonie Man, MD;  Location: [place] [facility];  Service: Cardiovascular;  Laterality: [name];  . Joint replacement     Family History  Problem Relation Age of Onset  . Cancer Mother   . Diabetes Father   . Heart attack Brother   . Heart attack Sister    Social History  Substance Use Topics  . Smoking status: Never Smoker   . Smokeless tobacco: Never Used  . Alcohol Use: No    Review of Systems  Constitutional: Negative for fever and chills.  Respiratory: Positive for shortness of breath (c/w baseline). Negative for cough.   Cardiovascular: Positive for leg swelling (c/w baseline). Negative for chest pain.  Gastrointestinal: Negative for nausea, vomiting, abdominal pain and diarrhea.  Musculoskeletal: Negative for back pain.  Neurological: Negative for light-headedness and headaches.  All other systems reviewed and are negative.     Allergies  Percocet and Sulfa antibiotics  Home Medications   Prior to Admission medications   Medication Sig Start Date End Date Taking? Authorizing Provider  albuterol (PROVENTIL HFA;VENTOLIN HFA) 108 (90 BASE) MCG/ACT inhaler Inhale 2 puffs into the lungs every 4 (four) hours as needed for wheezing or shortness of breath.    Yes Historical Provider, MD  allopurinol (ZYLOPRIM) 100 MG tablet Take 1 tablet by mouth two  times daily 12/25/15  Yes Edward [name], PA-C  carvedilol (COREG) 3.125 MG tablet 1 tab po bid Patient taking differently: Take 3.125 mg by mouth 2 (two) times daily.  01/19/16  Yes Mackie Pai, PA-C  DULERA 200-5 MCG/ACT AERO Use 2 puffs two times daily 11/02/15  Yes Edward [name], PA-C  ELIQUIS 5 MG TABS tablet Take 1 tablet (5 mg total) by mouth 2 (two) times daily. 10/12/15  Yes Edward [name], PA-C  fluticasone [facility]) 50 MCG/ACT nasal spray Use 2 sprays in each  nostril daily 11/13/15  Yes Midge Minium, MD  furosemide (LASIX) 40 MG tablet Take 2 tablets (80 mg total) by mouth daily. In  the AM Patient taking differently: Take 60-80 mg by mouth 2 (two) times daily. 80 mg by mouth in the morning and 60 mg six hours later 01/20/16  Yes Mackie Pai, PA-C  glipiZIDE (GLUCOTROL) 5 MG tablet Take 1 tablet (5 mg total) by mouth 2 (two) times daily before a meal. Reported on 12/29/2015 01/19/16 01/18/17 Yes Edward [name], PA-C  losartan (COZAAR) 25 MG tablet Take 0.5 tablets (12.5 mg total) by mouth daily. 01/19/16  Yes Edward [name], PA-C  montelukast (SINGULAIR) 10 MG tablet Take 1 tablet by mouth at  bedtime 11/10/15  Yes Edward [name], PA-C  NON FORMULARY CPAP at night (setting is at 15)   Yes Historical Provider, MD  omeprazole (PRILOSEC) 20 MG capsule Take 1 capsule by mouth  daily 12/25/15  Yes Mackie Pai, PA-C  potassium chloride (K-DUR) 10 MEQ tablet Take 1 tablet (10 mEq total) by mouth 3 (three) times daily. 02/01/16  Yes Jolaine Artist, MD  simvastatin (ZOCOR) 20 MG tablet Take 1 tablet by mouth  daily 01/05/16  Yes Edward [name], PA-C  tiotropium (SPIRIVA) 18 MCG inhalation capsule Place 1 capsule (18 mcg total) into inhaler and inhale daily. 09/22/15  Yes Edward [name], PA-C  traZODone (DESYREL) 50 MG tablet 1/2 tab po q hs Patient taking differently: Take 25 mg by mouth at bedtime.  01/19/16  Yes Edward [name], PA-C   BP 105/81 mmHg  Pulse 90  Temp(Src) 98.1 F (36.7 C) (Oral)  Resp 17  Ht 6' (1.829 m)  Wt 117.073 kg  BMI 35.00 kg/m2  SpO2 95% Physical Exam  Constitutional: He is oriented to person, place, and time. He appears well-developed and well-nourished. No distress.  HENT:  Head: Normocephalic and atraumatic.  Eyes: EOM are normal. Pupils are equal, round, and reactive to light.  Cardiovascular: Intact distal pulses.  An irregularly irregular rhythm present.  Irregulary irregular, Variable rate, 90-110 while I'm in the room.  Pulmonary/Chest: Effort normal. No tachypnea. No respiratory distress. He has no wheezes. He has no rhonchi.  Abdominal: Soft. There  is no tenderness. There is no rebound and no guarding.  Musculoskeletal: He exhibits no edema.  Trace edema in BLE.   Neurological: He is alert and oriented to person, place, and time.  Skin: Skin is warm and dry.    ED Course  Procedures (including critical care time) Labs Review Labs Reviewed  BASIC METABOLIC PANEL - Abnormal; Notable for the following:    Potassium 3.2 (*)    Chloride 100 (*)    Glucose, Bld 109 (*)    Creatinine, Ser 1.48 (*)    GFR calc non Af Amer 42 (*)    GFR calc Af Amer 49 (*)    All other components within normal limits  CBC - Abnormal; Notable for the following:    Hemoglobin 12.7 (*)    HCT 38.7 (*)    RDW 17.6 (*)    All other components within normal limits  BRAIN NATRIURETIC PEPTIDE - Abnormal; Notable for  the following:    B Natriuretic Peptide 604.4 (*)    All other components within normal limits  GLUCOSE, CAPILLARY - Abnormal; Notable for the following:    Glucose-Capillary 174 (*)    All other components within normal limits  BASIC METABOLIC PANEL  I-STAT TROPOININ, ED  Randolm Idol, ED    Imaging Review Dg Chest 2 View  02/02/2016  CLINICAL DATA:  Shortness of breath and productive cough EXAM: CHEST  2 VIEW COMPARISON:  01/05/2016 FINDINGS: Cardiac shadow is enlarged. The lungs are well aerated bilaterally. Diffuse interstitial changes are again identified without focal superimposed infiltrate. No sizable effusion is noted. IMPRESSION: Chronic changes without acute abnormality. Electronically Signed   By: Inez Catalina M.D.   On: 02/02/2016 15:17   I have personally reviewed and evaluated these images and lab results as part of my medical decision-making.   EKG Interpretation   Date/Time:  Tuesday Feb 02 2016 14:40:18 EDT Ventricular Rate:  111 PR Interval:    QRS Duration: 136 QT Interval:  372 QTC Calculation: 505 R Axis:   -53 Text Interpretation:  Atrial fibrillation with rapid ventricular response  Left axis deviation  Left bundle branch block Abnormal ECG No significant  change since last tracing Confirmed by Christy Gentles  MD, DONALD ([id number]) on  02/02/2016 5:24:58 PM      MDM   Final diagnoses:  Chronic systolic CHF (congestive heart failure), NYHA class 4 (HCC)  DOE (dyspnea on exertion)    Patient with a history of CHF presenting with dyspnea on exertion. Reports this is his persistence and worsening of his baseline shortness of breath. Denies any chest pain. EKG here without new ischemic changes or interval abnormalities. Patient is in A. fib without rapid ventricular rate. Troponin negative. Doubt ACS. BNP is elevated. Chest x-ray without evidence of significant volume overload, pneumonia, pneumothorax. Not requiring any supplemental oxygen at this time when immobile. Hypokalemia. Repleted here. Creatinine consistent with baseline.  Attempt to ambulate the patient. Reporting worsening shortness of breath. Did not have drops in oxygen saturations; however the patient had worsening work of breathing, dyspnea, tachycardia into the 120s.  Spoke with hospitalist. Will admit to their service for further observation and evaluation.  Patient admitted in stable condition.  Maryan Puls, MD 02/03/16 [id number]  Ripley Fraise, MD 02/04/16 [phone]

## 2016-02-02 NOTE — H&P (Addendum)
History and Physical    Christopher Holder [id number] DOB: September 19, 1930 DOA: 02/02/2016  Referring MD/NP/PA: Dr. Verlene Mayer PCP: Mackie Pai, PA-C Outpatient Specialists: Dr. Haroldine Laws (CHF), Dr. Halford Chessman (pulm) Patient coming from: ED  Chief Complaint: Progressive SOB  HPI: Christopher Holder is a 82 y.o. 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  Patient has been having increasing SOB over past 6 months, worse especially over the past few days.  This worsening despite not only medication compliance, but he actually has been loosing weight over past 4 days.  Patient with severe DOE.  Family and patient are currently pursuing facility placement due to severity of symptoms and impact on ADLs (patient currently lives home alone).  ED Course: Work up in ED shows his weight at 117 kg, this is actually down from his cardiologist office visit 20 days ago (was 119 kg), despite this his SOB has worsened.  Remainder of labs are c/w baseline.  CXR dosent show anything acute.  Review of Systems: As per HPI otherwise 10 point review of systems negative.    Past Medical History  Diagnosis Date  . Diabetes mellitus without complication ([place])   . CHF (congestive heart failure) ([place])   . Coronary artery disease   . COPD (chronic obstructive pulmonary disease) ([place])   . Asthma   . Hypertension   . HOH (hard of hearing)   . Sleep apnea   . Myocardial infarction ([place])   . Hyperlipidemia 05/27/2015  . GERD (gastroesophageal reflux disease) 05/27/2015  . AKI (acute kidney injury) ([place]) 08/18/2015    Past Surgical History  Procedure Laterality Date  . Coronary angioplasty with stent placement    . Knee surgery    . Knee surgery      Knee replacement x2  . Cardioversion [name] 08/28/2015    Procedure: CARDIOVERSION;  Surgeon: Jerline Pain, MD;  Location: [place];  Service: Cardiovascular;  Laterality: [name];  . Cardiac catheterization [name] 10/02/2015    Procedure: Left Heart Cath and Coronary Angiography;  Surgeon: Leonie Man, MD;  Location: [place] [facility];  Service: Cardiovascular;  Laterality: [name];     reports that he has never smoked. He has never used smokeless tobacco. He reports that he does not drink alcohol or use illicit drugs.  Allergies  Allergen Reactions  . Percocet [Oxycodone-Acetaminophen] Diarrhea, Nausea And Vomiting and Nausea Only  . Sulfa Antibiotics Diarrhea and Nausea And Vomiting    Family History  Problem Relation Age of Onset  . Cancer Mother   . Diabetes Father   . Heart attack Brother   . Heart attack Sister      Prior to Admission medications   Medication Sig Start Date End Date Taking? Authorizing Provider  albuterol (PROVENTIL HFA;VENTOLIN HFA) 108 (90 BASE) MCG/ACT inhaler Inhale 2 puffs into the lungs every 4 (four) hours as needed for wheezing or shortness of breath.     Historical Provider, MD  allopurinol (ZYLOPRIM) 100 MG tablet Take 1 tablet by mouth two  times daily 12/25/15   Mackie Pai, PA-C  carvedilol (COREG) 3.125 MG tablet 1 tab po bid 01/19/16   Mackie Pai, PA-C  DULERA 200-5 MCG/ACT AERO Use 2 puffs two times daily 11/02/15   Percell Miller [name], PA-C  ELIQUIS 5 MG TABS tablet Take 1 tablet (5 mg total) by mouth 2 (two)  times daily. 10/12/15   Percell Miller [name], PA-C  fluticasone [facility]) 50 MCG/ACT nasal spray Use 2 sprays in each  nostril daily 11/13/15   Midge Minium, MD  furosemide (LASIX) 20 MG tablet Take 80 mg by mouth every evening.     Historical Provider, MD  furosemide (LASIX) 40 MG tablet Take 2 tablets (80 mg total) by mouth daily. In the AM 01/20/16   Percell Miller [name], PA-C  glipiZIDE (GLUCOTROL) 5 MG tablet Take 1 tablet (5 mg total) by mouth 2 (two) times daily before a meal. Reported on 12/29/2015 01/19/16 01/18/17  Mackie Pai, PA-C  losartan (COZAAR) 25 MG tablet Take  0.5 tablets (12.5 mg total) by mouth daily. 01/19/16   Percell Miller [name], PA-C  montelukast (SINGULAIR) 10 MG tablet Take 1 tablet by mouth at  bedtime 11/10/15   Mackie Pai, PA-C  NON FORMULARY Take 15 each by mouth Nightly. CPAP at night    Historical Provider, MD  omeprazole (PRILOSEC) 20 MG capsule Take 1 capsule by mouth  daily 12/25/15   Mackie Pai, PA-C  potassium chloride (K-DUR) 10 MEQ tablet Take 1 tablet (10 mEq total) by mouth 3 (three) times daily. 02/01/16   Jolaine Artist, MD  simvastatin (ZOCOR) 20 MG tablet Take 1 tablet by mouth  daily 01/05/16   Mackie Pai, PA-C  tiotropium (SPIRIVA) 18 MCG inhalation capsule Place 1 capsule (18 mcg total) into inhaler and inhale daily. 09/22/15   Mackie Pai, PA-C  traZODone (DESYREL) 50 MG tablet 1/2 tab po q hs 01/19/16   Mackie Pai, PA-C    Physical Exam: Filed Vitals:   02/02/16 1830 02/02/16 1845 02/02/16 1900 02/02/16 2000  BP: 113/72 130/64 116/75 112/59  Pulse: 104 126 110 89  Temp:      TempSrc:      Resp: 25 23 27 18   Height:      Weight:      SpO2: 96% 94% 91% 92%      Constitutional: NAD, calm, comfortable Filed Vitals:   02/02/16 1830 02/02/16 1845 02/02/16 1900 02/02/16 2000  BP: 113/72 130/64 116/75 112/59  Pulse: 104 126 110 89  Temp:      TempSrc:      Resp: 25 23 27 18   Height:      Weight:      SpO2: 96% 94% 91% 92%   Eyes: PERRL, lids and conjunctivae normal ENMT: Mucous membranes are moist. Posterior pharynx clear of any exudate or lesions.Normal dentition.  Neck: normal, supple, no masses, no thyromegaly Respiratory: clear to auscultation bilaterally, no wheezing, no crackles. Normal respiratory effort. No accessory muscle use.  Cardiovascular: Regular rate and rhythm, no murmurs / rubs / gallops. No extremity edema. 2+ pedal pulses. No carotid bruits.  Abdomen: no tenderness, no masses palpated. No hepatosplenomegaly. Bowel sounds positive.  Musculoskeletal: no clubbing / cyanosis. No  joint deformity upper and lower extremities. Good ROM, no contractures. Normal muscle tone  1+ pitting edema BLE.  Patient states this is baseline as well. Skin: no rashes, lesions, ulcers. No induration Neurologic: CN 2-12 grossly intact. Sensation intact, DTR normal. Strength 5/5 in all 4.  Psychiatric: Normal judgment and insight. Alert and oriented x 3. Normal mood.    Labs on Admission: I have personally reviewed following labs and imaging studies  CBC:  Recent Labs Lab 02/02/16 1447  WBC 6.2  HGB 12.7*  HCT 38.7*  MCV 87.4  PLT [id number]   Basic Metabolic Panel:  Recent Labs Lab 02/02/16 1447  NA 137  K 3.2*  CL 100*  CO2 26  GLUCOSE 109*  BUN 18  CREATININE 1.48*  CALCIUM 9.0   GFR: Estimated Creatinine Clearance: 49.2 mL/min (by C-G formula based on Cr of 1.48). Liver Function Tests: No results for input(s): AST, ALT, ALKPHOS, BILITOT, PROT, ALBUMIN in the last 168 hours. No results for input(s): LIPASE, AMYLASE in the last 168 hours. No results for input(s): AMMONIA in the last 168 hours. Coagulation Profile: No results for input(s): INR, PROTIME in the last 168 hours. Cardiac Enzymes: No results for input(s): CKTOTAL, CKMB, CKMBINDEX, TROPONINI in the last 168 hours. BNP (last 3 results)  Recent Labs  10/06/15 1153 10/21/15 1106 01/05/16 1148  PROBNP 474.0* 318.0* 538.0*   HbA1C: No results for input(s): HGBA1C in the last 72 hours. CBG: No results for input(s): GLUCAP in the last 168 hours. Lipid Profile: No results for input(s): CHOL, HDL, LDLCALC, TRIG, CHOLHDL, LDLDIRECT in the last 72 hours. Thyroid Function Tests: No results for input(s): TSH, T4TOTAL, FREET4, T3FREE, THYROIDAB in the last 72 hours. Anemia Panel: No results for input(s): VITAMINB12, FOLATE, FERRITIN, TIBC, IRON, RETICCTPCT in the last 72 hours. Urine analysis:    Component Value Date/Time   COLORURINE YELLOW 12/16/2015 0011   APPEARANCEUR CLEAR 12/16/2015 0011   LABSPEC  1.013 12/16/2015 0011   PHURINE 6.0 12/16/2015 0011   GLUCOSEU NEGATIVE 12/16/2015 0011   HGBUR NEGATIVE 12/16/2015 0011   BILIRUBINUR NEGATIVE 12/16/2015 0011   KETONESUR NEGATIVE 12/16/2015 0011   PROTEINUR NEGATIVE 12/16/2015 0011   NITRITE NEGATIVE 12/16/2015 0011   LEUKOCYTESUR NEGATIVE 12/16/2015 0011   Sepsis Labs: @LABRCNTIP (procalcitonin:4,lacticidven:4) )No results found for this or any previous visit (from the past 240 hour(s)).   Radiological Exams on Admission: Dg Chest 2 View  02/02/2016  CLINICAL DATA:  Shortness of breath and productive cough EXAM: CHEST  2 VIEW COMPARISON:  01/05/2016 FINDINGS: Cardiac shadow is enlarged. The lungs are well aerated bilaterally. Diffuse interstitial changes are again identified without focal superimposed infiltrate. No sizable effusion is noted. IMPRESSION: Chronic changes without acute abnormality. Electronically Signed   By: Inez Catalina M.D.   On: 02/02/2016 15:17    EKG: Independently reviewed.  Assessment/Plan Principal Problem:   DOE (dyspnea on exertion) Active Problems:   Diabetes mellitus type 2, controlled (HCC)   HTN (hypertension)   Persistent atrial fibrillation (HCC)   Chronic systolic CHF (congestive heart failure), NYHA class 4 (HCC)   Pulmonary fibrosis, postinflammatory (HCC)   DOE due to pulmonary fibrosis and likely worsening baseline chronic systolic CHF -  CHF pathway  2d echo ordered  CXR dosent show pulmonary edema, and his lungs dont sound like they are fluid overloaded.  Given this and the progression of his symptoms despite recent weight loss, I am not sure that increasing his lasix further is indicated at this point.  Consider cardiac re-syncrozation therapy (has LBBB, EF 25%, severe symptoms at baseline now)?  Likely warrants HF consult in AM.  SW consult to try and speed SNF / ALF placement.  DM2 -  Holding home glipizide  SSI AC/HS moderate dose  A.Fib -  Continue rate control  Continue  Eliquis  HTN - continue home meds   DVT prophylaxis: Eliquis Code Status: Full Family Communication: No family in room Consults called: None Admission status: Admit to obs   Kristie Bracewell, [facility] Pager [phone] from 7PM-7AM  If 7AM-7PM, please contact the day physician for the patient [email or web address] Password [facility]  02/02/2016,  8:31 PM

## 2016-02-02 NOTE — Telephone Encounter (Signed)
Spoke with pt and he states that he does not have a ride at this time and he states that his granddaughter Christopher Holder is in the hospital and she is his only ride and he does not have any other way to get to the office. He states that when he gets a ride he will call the office to set up the appointment to get paperwork started to get him into an assisted living.

## 2016-02-02 NOTE — ED Notes (Signed)
Admitting at bedside 

## 2016-02-03 ENCOUNTER — Observation Stay (HOSPITAL_BASED_OUTPATIENT_CLINIC_OR_DEPARTMENT_OTHER): Payer: Medicare Other

## 2016-02-03 ENCOUNTER — Encounter (HOSPITAL_COMMUNITY): Payer: Self-pay | Admitting: General Practice

## 2016-02-03 DIAGNOSIS — J8410 Pulmonary fibrosis, unspecified: Secondary | ICD-10-CM | POA: Diagnosis not present

## 2016-02-03 DIAGNOSIS — I5022 Chronic systolic (congestive) heart failure: Secondary | ICD-10-CM | POA: Diagnosis not present

## 2016-02-03 DIAGNOSIS — R0609 Other forms of dyspnea: Secondary | ICD-10-CM | POA: Diagnosis not present

## 2016-02-03 DIAGNOSIS — I509 Heart failure, unspecified: Secondary | ICD-10-CM | POA: Diagnosis not present

## 2016-02-03 DIAGNOSIS — I481 Persistent atrial fibrillation: Secondary | ICD-10-CM | POA: Diagnosis not present

## 2016-02-03 LAB — BASIC METABOLIC PANEL
ANION GAP: 11 (ref 5–15)
BUN: 17 mg/dL (ref 6–20)
CO2: 27 mmol/L (ref 22–32)
Calcium: 8.9 mg/dL (ref 8.9–10.3)
Chloride: 103 mmol/L (ref 101–111)
Creatinine, Ser: 1.35 mg/dL — ABNORMAL HIGH (ref 0.61–1.24)
GFR, EST AFRICAN AMERICAN: 54 mL/min — AB (ref >60)
GFR, EST NON AFRICAN AMERICAN: 47 mL/min — AB (ref >60)
Glucose, Bld: 127 mg/dL — ABNORMAL HIGH (ref 65–99)
Potassium: 3.6 mmol/L (ref 3.5–5.1)
SODIUM: 141 mmol/L (ref 135–145)

## 2016-02-03 LAB — ECHOCARDIOGRAM COMPLETE
HEIGHTINCHES: 72 in
WEIGHTICAEL: 4064 [oz_av]

## 2016-02-03 LAB — GLUCOSE, CAPILLARY
GLUCOSE-CAPILLARY: 157 mg/dL — AB (ref 65–99)
GLUCOSE-CAPILLARY: 137 mg/dL — AB (ref 65–99)
Glucose-Capillary: 89 mg/dL (ref 65–99)
Glucose-Capillary: 125 mg/dL — ABNORMAL HIGH (ref 65–99)

## 2016-02-03 MED ORDER — SPIRONOLACTONE 25 MG PO TABS
12.5000 mg | ORAL_TABLET | Freq: Every day | ORAL | Status: DC
  Administered 2016-02-03 – 2016-02-05 (×3): 12.5 mg via ORAL
  Filled 2016-02-03 (×3): qty 1

## 2016-02-03 NOTE — Clinical Social Work Note (Signed)
CSW acknowledges facility placement consult. Patient must be seen by PT for recommendations first.  Will continue to follow.  Dayton Scrape, [place]

## 2016-02-03 NOTE — Progress Notes (Signed)
Echocardiogram 2D Echocardiogram has been performed.  Christopher Holder 02/03/2016, 1:22 PM

## 2016-02-03 NOTE — Progress Notes (Signed)
RT set up patient CPAP mask. RT set CPAP on 15 home reg. No O2 bleed in needed. Patient is able to put mask on himself. He does not need any assistance. RT will continue to monitor as needed.

## 2016-02-03 NOTE — Progress Notes (Signed)
PROGRESS NOTE  Christopher Holder [id number] DOB: 1930/03/25 DOA: 02/02/2016 PCP: Mackie Pai, PA-C     Brief Narrative: 82 y.o. male with medical history significant of systolic CHF with EF of 123XX123, post inflammatory pulmonary fibrosis following PNA with ARDS in Dec 2016, HTN, HLD, DM, CKD stage 3, OSA on CPAP, persistent A.Fib, admitted 5/16 with worsening dyspnea.  Assessment & Plan: Principal Problem:   DOE (dyspnea on exertion) Active Problems:   Diabetes mellitus type 2, controlled (HCC)   HTN (hypertension)   Persistent atrial fibrillation (HCC)   Chronic systolic CHF (congestive heart failure), NYHA class 4 (HCC)   Pulmonary fibrosis, postinflammatory (HCC)  Acute hypoxic respiratory failure due to acute on chronic combined CHF and pulmonary fibrosis - multifactorial CHF / underlying pulmonary fibrosis - 2D echo pending - consulted heart failure team, appreciate input - continue Lasix, Spironolactone, Coreg, Dulera, Spiriva  DM  - hold home glipizide, SSI  PAF - continue Coreg, Eliquis  HTN - continue home medications   CAD - stable no chest pain, continue current regimen   DVT prophylaxis: Eliquis Code Status: Full Family Communication: no family at bedside Disposition Plan: home/SNF when ready  Consultants:   Cardiology   Procedures:  2D echo  Antimicrobials:  None    Subjective: - ongoing dyspnea with little movement even if he tried to reposition himself in bed  Objective: Filed Vitals:   02/02/16 2358 02/03/16 0445 02/03/16 0902 02/03/16 1015  BP:  103/52  109/56  Pulse: 90 91  95  Temp:  98.2 F (36.8 C)    TempSrc:  Oral    Resp: 17 18    Height:  6' (1.829 m)    Weight:  115.214 kg (254 lb)    SpO2: 95% 98% 98%     Intake/Output Summary (Last 24 hours) at 02/03/16 1252 Last data filed at 02/03/16 0832  Gross per 24 hour  Intake    840 ml  Output    525 ml  Net    315 ml   Filed Weights   02/02/16 1453 02/02/16 2130  02/03/16 0445  Weight: 116.983 kg (257 lb 14.4 oz) 117.073 kg (258 lb 1.6 oz) 115.214 kg (254 lb)    Examination: Constitutional: NAD Filed Vitals:   02/02/16 2358 02/03/16 0445 02/03/16 0902 02/03/16 1015  BP:  103/52  109/56  Pulse: 90 91  95  Temp:  98.2 F (36.8 C)    TempSrc:  Oral    Resp: 17 18    Height:  6' (1.829 m)    Weight:  115.214 kg (254 lb)    SpO2: 95% 98% 98%    Eyes: PERRL, lids and conjunctivae normal ENMT: Mucous membranes are moist. Poor dentition. Respiratory: clear to auscultation bilaterally, no wheezing, no crackles. Normal respiratory effort.  Cardiovascular: irregular. Trace lower extremity edema. 2+ pedal pulses. No carotid bruits.  Abdomen: no tenderness. Bowel sounds positive.  Musculoskeletal: no clubbing / cyanosis. Neurologic: non focal  Psychiatric: Normal judgment and insight. Alert and oriented x 3. Normal mood.    Data Reviewed: I have personally reviewed following labs and imaging studies  CBC:  Recent Labs Lab 02/02/16 1447  WBC 6.2  HGB 12.7*  HCT 38.7*  MCV 87.4  PLT [id number]   Basic Metabolic Panel:  Recent Labs Lab 02/02/16 1447 02/03/16 0403  NA 137 141  K 3.2* 3.6  CL 100* 103  CO2 26 27  GLUCOSE 109* 127*  BUN 18 17  CREATININE 1.48* 1.35*  CALCIUM 9.0 8.9   GFR: Estimated Creatinine Clearance: 54.3 mL/min (by C-G formula based on Cr of 1.35). Liver Function Tests: No results for input(s): AST, ALT, ALKPHOS, BILITOT, PROT, ALBUMIN in the last 168 hours. No results for input(s): LIPASE, AMYLASE in the last 168 hours. No results for input(s): AMMONIA in the last 168 hours. Coagulation Profile: No results for input(s): INR, PROTIME in the last 168 hours. Cardiac Enzymes: No results for input(s): CKTOTAL, CKMB, CKMBINDEX, TROPONINI in the last 168 hours. BNP (last 3 results)  Recent Labs  10/06/15 1153 10/21/15 1106 01/05/16 1148  PROBNP 474.0* 318.0* 538.0*   HbA1C: No results for input(s): HGBA1C in  the last 72 hours. CBG:  Recent Labs Lab 02/02/16 2242 02/03/16 0618 02/03/16 1224  GLUCAP 174* 89 125*   Lipid Profile: No results for input(s): CHOL, HDL, LDLCALC, TRIG, CHOLHDL, LDLDIRECT in the last 72 hours. Thyroid Function Tests: No results for input(s): TSH, T4TOTAL, FREET4, T3FREE, THYROIDAB in the last 72 hours. Anemia Panel: No results for input(s): VITAMINB12, FOLATE, FERRITIN, TIBC, IRON, RETICCTPCT in the last 72 hours. Urine analysis:    Component Value Date/Time   COLORURINE YELLOW 12/16/2015 0011   APPEARANCEUR CLEAR 12/16/2015 0011   LABSPEC 1.013 12/16/2015 0011   PHURINE 6.0 12/16/2015 0011   GLUCOSEU NEGATIVE 12/16/2015 0011   HGBUR NEGATIVE 12/16/2015 0011   BILIRUBINUR NEGATIVE 12/16/2015 0011   KETONESUR NEGATIVE 12/16/2015 0011   PROTEINUR NEGATIVE 12/16/2015 0011   NITRITE NEGATIVE 12/16/2015 0011   LEUKOCYTESUR NEGATIVE 12/16/2015 0011   Sepsis Labs: Invalid input(s): PROCALCITONIN, LACTICIDVEN  No results found for this or any previous visit (from the past 240 hour(s)).    Radiology Studies: Dg Chest 2 View  02/02/2016  CLINICAL DATA:  Shortness of breath and productive cough EXAM: CHEST  2 VIEW COMPARISON:  01/05/2016 FINDINGS: Cardiac shadow is enlarged. The lungs are well aerated bilaterally. Diffuse interstitial changes are again identified without focal superimposed infiltrate. No sizable effusion is noted. IMPRESSION: Chronic changes without acute abnormality. Electronically Signed   By: Inez Catalina M.D.   On: 02/02/2016 15:17     Scheduled Meds: . allopurinol  100 mg Oral BID  . apixaban  5 mg Oral BID  . carvedilol  3.125 mg Oral BID WC  . fluticasone  2 spray Each Nare Daily  . furosemide  60 mg Oral Q24H  . furosemide  80 mg Oral Daily  . insulin aspart  0-15 Units Subcutaneous TID WC  . losartan  12.5 mg Oral Daily  . mometasone-formoterol  2 puff Inhalation BID  . montelukast  10 mg Oral QHS  . pantoprazole  40 mg Oral  Daily  . potassium chloride  10 mEq Oral TID  . simvastatin  20 mg Oral q1800  . sodium chloride flush  3 mL Intravenous Q12H  . spironolactone  12.5 mg Oral Daily  . tiotropium  18 mcg Inhalation Daily  . traZODone  25 mg Oral QHS   Continuous Infusions:     Marzetta Board, MD, PhD [facility] Pager [phone]  If 7PM-7AM, please contact night-coverage [email or web address] Password [facility] 02/03/2016, 12:52 PM

## 2016-02-03 NOTE — Progress Notes (Signed)
Apixaban Validation Study (ClinicalTrials.gov Identifier: [id number]) RESEARCH SUBJECT. Purpose: Obtain fresh samples that will be used to assess the performances of STA-Apixaban Calibrator and STA-Apixaban Control in combination with the STA-Liquid Anti-Xa to determine the quantity of apixaban in plasma samples by measurement of its direct anti-Xa activity. Apixaban Validation Study is sponsored by [employer].The fresh samples will collected by completing a one time blood draw on approved patients.   Inclusion Criteria: weight </= 60kg, >/= 75 years, Hct <39% for male, < 36% for male, renal impairment, co-medication with ASA/NSAIDs, and/or co-medication with anti-platelet agents.  Protocol Title:  Apixaban Validation Study Informed Consent   Subject Name: Christopher Holder  This patient, Christopher Holder, has been consented to the above clinical trial according to FDA regulations, GCP guidelines and [employer]'s SOPs. The informed consent form and study design have been explained to this patient by this study coordinator. The patient demonstrated comprehension of this clinical trial and study requirements/expectations. No study procedures have been initiated before consenting of this patient. The patient was given sufficient time for reading the consent form. All risks, benefits and options have been thoroughly discussed and all questions were answered per the patient's satisfaction. This patient was not coerced in any way to participate in this clinical trial. This patient has voluntarily signed consent version one at 1204 pm on 02/03/2016. A copy of the signed consent form was given to the patient and a copy was placed in the subject's medical record.   [place], [place], Research Assistant Office [phone]

## 2016-02-03 NOTE — Consult Note (Signed)
Advanced Heart Failure Team Consult Note  Referring Physician: Dr Renne Crigler Primary Physician: Nani Skillern PA-C Primary Cardiologist:  Dr Haroldine Laws  Reason for Consultation: Heart Failure   HPI:   Christopher Holder is a 82 y.o. 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  He was been followed in the HF clinic and was seen April At that time he was stable and losartan added. Weight at that time was 263 pounds. Grand-daughter lives with him and works at [employer] in [employer]. Unfortunately she left Saturday and has not returned.   Admitted with increased dyspnea.Says he has been taking all medications. Weight at home 250-251 pounds.  CXR on admit was ok. He continued on oral diuretics . Complaining of mild dyspnea with exertion.   Pertinent labs on admit: BNP 604, K 3.2, creatinine 1.48, Hgb 12.7, CE negative.   Cardiac Test -Echo 08/21/15 LVEF 25-30%, Severe diffuse HK, Moderate MR, PA peak pressure 41 mm Hg -Nuclear Stress 09/25/15 - Indications, with elevated troponins with acute illness and low EF on echo. Showed EF 36%, High risk study with severe LVE suggestion of TID. Small inferior wall infarct from apex to base.  -LHC 10/02/15 with normal coronaries and widely patent D1 stent, no culprit lesions for previous HIGH RISK stress test. Elevated LVEDP and severe LV systolic dysfunction.  Review of Systems: [y] = yes, [ ]  = no   General: Weight gain [ ] ; Weight loss [ ] ; Anorexia [ ] ; Fatigue [ ] ; Fever [ ] ; Chills [ ] ; Weakness [ ]   Cardiac: Chest pain/pressure [ ] ; Resting SOB [ ] ; Exertional SOB [ Y]; Orthopnea [ ] ; Pedal Edema [Y ]; Palpitations [ ] ; Syncope [ ] ; Presyncope [ ] ; Paroxysmal nocturnal dyspnea[ ]   Pulmonary: Cough [ ] ; Wheezing[ ] ; Hemoptysis[ ] ; Sputum [ ] ; Snoring [ ]   GI: Vomiting[ ] ; Dysphagia[ ] ; Melena[  ]; Hematochezia [ ] ; Heartburn[ ] ; Abdominal pain [ ] ; Constipation [ ] ; Diarrhea [ ] ; BRBPR [ ]   GU: Hematuria[ ] ; Dysuria [ ] ; Nocturia[ ]   Vascular: Pain in legs with walking [ ] ; Pain in feet with lying flat [ ] ; Non-healing sores [ ] ; Stroke [ ] ; TIA [ ] ; Slurred speech [ ] ;  Neuro: Headaches[ ] ; Vertigo[ ] ; Seizures[ ] ; Paresthesias[ ] ;Blurred vision [ ] ; Diplopia [ ] ; Vision changes [ ]   Ortho/Skin: Arthritis [ ] ; Joint pain [Y ]; Muscle pain [ ] ; Joint swelling [ ] ; Back Pain [ ] ; Rash [ ]   Psych: Depression[ ] ; Anxiety[ ]   Heme: Bleeding problems [ ] ; Clotting disorders [ ] ; Anemia [ ]   Endocrine: Diabetes [ ] ; Thyroid dysfunction[ ]   Home Medications Prior to Admission medications   Medication Sig Start Date End Date Taking? Authorizing Provider  albuterol (PROVENTIL HFA;VENTOLIN HFA) 108 (90 BASE) MCG/ACT inhaler Inhale 2 puffs into the lungs every 4 (four) hours as needed for wheezing or shortness of breath.    Yes Historical Provider, MD  allopurinol (ZYLOPRIM) 100 MG tablet Take 1 tablet by mouth two  times daily 12/25/15  Yes Edward [name], PA-C  carvedilol (COREG) 3.125 MG tablet 1 tab po bid Patient taking differently: Take 3.125 mg by mouth 2 (two) times daily.  01/19/16  Yes Edward [name], PA-C  DULERA 200-5 MCG/ACT AERO Use 2 puffs two times daily 11/02/15  Yes Mackie Pai, PA-C  Arne Cleveland  5 MG TABS tablet Take 1 tablet (5 mg total) by mouth 2 (two) times daily. 10/12/15  Yes Edward [name], PA-C  fluticasone [facility]) 50 MCG/ACT nasal spray Use 2 sprays in each  nostril daily 11/13/15  Yes Midge Minium, MD  furosemide (LASIX) 40 MG tablet Take 2 tablets (80 mg total) by mouth daily. In the AM Patient taking differently: Take 60-80 mg by mouth 2 (two) times daily. 80 mg by mouth in the morning and 60 mg six hours later 01/20/16  Yes Mackie Pai, PA-C  glipiZIDE (GLUCOTROL) 5 MG tablet Take 1 tablet (5 mg total) by mouth 2 (two) times daily before a meal. Reported on  12/29/2015 01/19/16 01/18/17 Yes Edward [name], PA-C  losartan (COZAAR) 25 MG tablet Take 0.5 tablets (12.5 mg total) by mouth daily. 01/19/16  Yes Edward [name], PA-C  montelukast (SINGULAIR) 10 MG tablet Take 1 tablet by mouth at  bedtime 11/10/15  Yes Edward [name], PA-C  NON FORMULARY CPAP at night (setting is at 15)   Yes Historical Provider, MD  omeprazole (PRILOSEC) 20 MG capsule Take 1 capsule by mouth  daily 12/25/15  Yes Mackie Pai, PA-C  potassium chloride (K-DUR) 10 MEQ tablet Take 1 tablet (10 mEq total) by mouth 3 (three) times daily. 02/01/16  Yes Jolaine Artist, MD  simvastatin (ZOCOR) 20 MG tablet Take 1 tablet by mouth  daily 01/05/16  Yes Edward [name], PA-C  tiotropium (SPIRIVA) 18 MCG inhalation capsule Place 1 capsule (18 mcg total) into inhaler and inhale daily. 09/22/15  Yes Edward [name], PA-C  traZODone (DESYREL) 50 MG tablet 1/2 tab po q hs Patient taking differently: Take 25 mg by mouth at bedtime.  01/19/16  Yes Mackie Pai, PA-C    Past Medical History: Past Medical History  Diagnosis Date  . Diabetes mellitus without complication ([place])   . CHF (congestive heart failure) ([place])   . Coronary artery disease   . COPD (chronic obstructive pulmonary disease) ([place])   . Asthma   . Hypertension   . HOH (hard of hearing)   . Sleep apnea   . Myocardial infarction ([place])   . Hyperlipidemia 05/27/2015  . GERD (gastroesophageal reflux disease) 05/27/2015  . AKI (acute kidney injury) ([place]) 08/18/2015  . Arthritis   . Pulmonary fibrosis [facility])     Past Surgical History: Past Surgical History  Procedure Laterality Date  . Coronary angioplasty with stent placement    . Knee surgery    . Total knee arthroplasty      Knee replacement x2  . Cardioversion [name] 08/28/2015    Procedure: CARDIOVERSION;  Surgeon: Jerline Pain, MD;  Location: [place];  Service: Cardiovascular;  Laterality: [name];  . Cardiac catheterization [name] 10/02/2015    Procedure: Left Heart Cath and  Coronary Angiography;  Surgeon: Leonie Man, MD;  Location: [place] CV LAB;  Service: Cardiovascular;  Laterality: [name];  . Joint replacement      Family History: Family History  Problem Relation Age of Onset  . Cancer Mother   . Diabetes Father   . Heart attack Brother   . Heart attack Sister     Social History: Social History   Social History  . Marital Status: Widowed    Spouse Name: [name]  . Number of Children: [name]  . Years of Education: [name]   Occupational History  . retired    Social History Main Topics  . Smoking status: Never Smoker   . Smokeless tobacco: Never Used  .  Alcohol Use: No  . Drug Use: No  . Sexual Activity: Not Asked   Other Topics Concern  . None   Social History Narrative    Allergies:  Allergies  Allergen Reactions  . Percocet [Oxycodone-Acetaminophen] Diarrhea, Nausea And Vomiting and Nausea Only  . Sulfa Antibiotics Diarrhea and Nausea And Vomiting    Objective:    Vital Signs:   Temp:  [98.1 F (36.7 C)-98.5 F (36.9 C)] 98.2 F (36.8 C) (05/17 0445) Pulse Rate:  [88-126] 95 (05/17 1015) Resp:  [17-27] 18 (05/17 0445) BP: (103-131)/(52-93) 109/56 mmHg (05/17 1015) SpO2:  [91 %-99 %] 98 % (05/17 0902) FiO2 (%):  [28 %] 28 % (05/17 0902) Weight:  [254 lb (115.214 kg)-258 lb 1.6 oz (117.073 kg)] 254 lb (115.214 kg) (05/17 0445) Last BM Date: 02/02/16  Weight change: Filed Weights   02/02/16 1453 02/02/16 2130 02/03/16 0445  Weight: 257 lb 14.4 oz (116.983 kg) 258 lb 1.6 oz (117.073 kg) 254 lb (115.214 kg)    Intake/Output:   Intake/Output Summary (Last 24 hours) at 02/03/16 1059 Last data filed at 02/03/16 0832  Gross per 24 hour  Intake    840 ml  Output    525 ml  Net    315 ml     Physical Exam: General:  Well appearing. No resp difficulty. Sitting in the chair.  HEENT: normal Neck: supple. JVP ~7  . Carotids 2+ bilat; no bruits. No lymphadenopathy or thryomegaly appreciated. Cor: PMI nondisplaced.  Irregular rate & rhythm. No rubs, gallops or murmurs. Lungs: clear Abdomen: obese. soft, nontender, nondistended. No hepatosplenomegaly. No bruits or masses. Good bowel sounds. Extremities: no cyanosis, clubbing, rash, trace edema Neuro: alert & orientedx3, cranial nerves grossly intact. moves all 4 extremities w/o difficulty. Affect pleasant  Telemetry: A fib 90-100s  Labs: Basic Metabolic Panel:  Recent Labs Lab 02/02/16 1447 02/03/16 0403  NA 137 141  K 3.2* 3.6  CL 100* 103  CO2 26 27  GLUCOSE 109* 127*  BUN 18 17  CREATININE 1.48* 1.35*  CALCIUM 9.0 8.9    Liver Function Tests: No results for input(s): AST, ALT, ALKPHOS, BILITOT, PROT, ALBUMIN in the last 168 hours. No results for input(s): LIPASE, AMYLASE in the last 168 hours. No results for input(s): AMMONIA in the last 168 hours.  CBC:  Recent Labs Lab 02/02/16 1447  WBC 6.2  HGB 12.7*  HCT 38.7*  MCV 87.4  PLT 197    Cardiac Enzymes: No results for input(s): CKTOTAL, CKMB, CKMBINDEX, TROPONINI in the last 168 hours.  BNP: BNP (last 3 results)  Recent Labs  08/26/15 0510 12/15/15 2100 02/02/16 1447  BNP 1054.2* 481.6* 604.4*    ProBNP (last 3 results)  Recent Labs  10/06/15 1153 10/21/15 1106 01/05/16 1148  PROBNP 474.0* 318.0* 538.0*     CBG:  Recent Labs Lab 02/02/16 2242 02/03/16 0618  GLUCAP 174* 89    Coagulation Studies: No results for input(s): LABPROT, INR in the last 72 hours.  Other results: EKG:A fib 111 bpm QRS 136   Imaging: Dg Chest 2 View  02/02/2016  CLINICAL DATA:  Shortness of breath and productive cough EXAM: CHEST  2 VIEW COMPARISON:  01/05/2016 FINDINGS: Cardiac shadow is enlarged. The lungs are well aerated bilaterally. Diffuse interstitial changes are again identified without focal superimposed infiltrate. No sizable effusion is noted. IMPRESSION: Chronic changes without acute abnormality. Electronically Signed   By: Inez Catalina M.D.   On: 02/02/2016  15:17  Medications:     Current Medications: . allopurinol  100 mg Oral BID  . apixaban  5 mg Oral BID  . carvedilol  3.125 mg Oral BID WC  . fluticasone  2 spray Each Nare Daily  . furosemide  60 mg Oral Q24H  . furosemide  80 mg Oral Daily  . insulin aspart  0-15 Units Subcutaneous TID WC  . losartan  12.5 mg Oral Daily  . mometasone-formoterol  2 puff Inhalation BID  . montelukast  10 mg Oral QHS  . pantoprazole  40 mg Oral Daily  . potassium chloride  10 mEq Oral TID  . simvastatin  20 mg Oral q1800  . sodium chloride flush  3 mL Intravenous Q12H  . tiotropium  18 mcg Inhalation Daily  . traZODone  25 mg Oral QHS     Infusions:      Assessment/Plan   Christopher Holder is well known to the HF team and has been followed for NICM. From HF perspective he seems well compensated. He does not appear volume overloaded.  I think that his social situation may have played role in his dyspnea. His granddaughter lives with him but she has not been home in 4 days and may be in [employer]. She did the shopping and picked up his medications.  Will likely need SNF.    1. Dyspnea - Multifactorial given COPD and pulmonary fibrosis. CXR was ok. Does not appear volume overloaded.  2. Chronic Systolic HF- NICM ECHO 123XX123 EF 25-30% . Repeat ECHO.  On admit  CXR and BNP ok.  NYHA III. Volume status stable.  - Continue lasix 80 mg in am and 60 mg in the afternoon + 20 meq K daily.  - Continue coreg to 6.25 mg twice a day - Continue 12.5 mg losartan daily. Hold off on entresto for now with BP.  - Add 12. 5 mg spiro daily - BMET in am.   2. Atrial Fibrillation-Rate controlled. On eliquis. On carvedilol. Can consider DC-CV but seems well compensated with rate control.  3. CAD- S/P D1 Stent -patent LHC 2017 On eliquis. On statin and BB 4. HTN- Controlled continue current regimen.  5. HLD- on statin 6. CKD stage III- check BMET today.  7. COPD 8. OSA with CPAP- continue  nightly  9. CKD Stage III- BMET today  10. Pulmonary Fibrosis- followed in the community by Dr Halford Chessman.   Disposition per primary team. / Likely SNF.   Length of Stay:   Darrick Grinder NP-C  02/03/2016, 10:59 AM  Advanced Heart Failure Team Pager [phone] (M-F; 7a - 4p)  Please contact [facility] for night-coverage after hours (4p -7a ) and weekends on [email or web address]  Patient seen with NP, agree with the above note.  He was admitted with increased dyspnea. He actually looks pretty well compensated on exam from volume standpoint.  JVP about 8 and weight has been trending down.  I suspect the dyspnea is a combination of CHF and pulmonary fibrosis.  I am not sure why he is acutely worse, it may be related to his grand-daughter and caretaker being gone (social issues).  Would continue current Lasix and add spironolactone 12.5 mg daily.  He will likely need SNF.   It would be reasonable to consider trying to get him out of atrial fibrillation.  He was in NSR back in January, but has been in atrial fibrillation more persistently recently.  He would likely not hold NSR without anti-arrhythmic.  Would not use  amiodarone with pulmonary fibrosis.  Could consider Tikosyn, but would need to reassess ECG as QT appeared too long on admission ECG.    He is getting repeat echo.  Would avoid ICD with age.  Could consider CRT given LBBB but QRS < 150 msec so benefit may be somewhat marginal.  Loralie Champagne 02/03/2016 12:39 PM

## 2016-02-04 ENCOUNTER — Ambulatory Visit: Payer: Medicare Other | Admitting: Emergency Medicine

## 2016-02-04 ENCOUNTER — Encounter (HOSPITAL_COMMUNITY): Payer: Medicare Other

## 2016-02-04 DIAGNOSIS — R0609 Other forms of dyspnea: Secondary | ICD-10-CM | POA: Diagnosis not present

## 2016-02-04 LAB — CBC
HCT: 37.5 % — ABNORMAL LOW (ref 39.0–52.0)
Hemoglobin: 12.1 g/dL — ABNORMAL LOW (ref 13.0–17.0)
MCH: 28.1 pg (ref 26.0–34.0)
MCHC: 32.3 g/dL (ref 30.0–36.0)
MCV: 87 fL (ref 78.0–100.0)
PLATELETS: 189 10*3/uL (ref 150–400)
RBC: 4.31 MIL/uL (ref 4.22–5.81)
RDW: 18 % — AB (ref 11.5–15.5)
WBC: 6.9 10*3/uL (ref 4.0–10.5)

## 2016-02-04 LAB — BASIC METABOLIC PANEL
Anion gap: 13 (ref 5–15)
BUN: 20 mg/dL (ref 6–20)
CALCIUM: 8.9 mg/dL (ref 8.9–10.3)
CHLORIDE: 100 mmol/L — AB (ref 101–111)
CO2: 27 mmol/L (ref 22–32)
CREATININE: 1.43 mg/dL — AB (ref 0.61–1.24)
GFR calc Af Amer: 51 mL/min — ABNORMAL LOW (ref >60)
GFR calc non Af Amer: 44 mL/min — ABNORMAL LOW (ref >60)
Glucose, Bld: 139 mg/dL — ABNORMAL HIGH (ref 65–99)
Potassium: 3.7 mmol/L (ref 3.5–5.1)
SODIUM: 140 mmol/L (ref 135–145)

## 2016-02-04 LAB — GLUCOSE, CAPILLARY
GLUCOSE-CAPILLARY: 130 mg/dL — AB (ref 65–99)
Glucose-Capillary: 154 mg/dL — ABNORMAL HIGH (ref 65–99)
Glucose-Capillary: 108 mg/dL — ABNORMAL HIGH (ref 65–99)
Glucose-Capillary: 152 mg/dL — ABNORMAL HIGH (ref 65–99)

## 2016-02-04 NOTE — Clinical Social Work Note (Signed)
Patient accepted bed offer from [facility]. Per MD, patient will discharge tomorrow. Patient's daughter can transport if he is ready to leave by 10:00 am.  Christopher Holder, [place]

## 2016-02-04 NOTE — Evaluation (Signed)
Physical Therapy Evaluation Patient Details Name: Christopher Holder MRN: [id number] DOB: May 28, 1930 Today's Date: 02/04/2016   History of Present Illness  82 y.o. male with medical history significant of systolic CHF with EF of 123XX123, post inflammatory pulmonary fibrosis following PNA with ARDS in Dec 2016, HTN, HLD, DM, CKD stage 3, OSA on CPAP, persistent A.Fib, admitted 5/16 with worsening dyspnea. At time of admission, His granddaughter lives with him but she has not been home in 4 days. She did the shopping and picked up his medications.    Clinical Impression  Pt admitted with above diagnosis. Has had a significant functional decline over past several weeks. No longer has assist of family and living alone. Patient at high fall risk associated with incr fatigue and anxiety, which leads to impulsivity, poor use of RW, poor judgement/problem solving. Patient with HR 109 at rest and incr to 143 with walking 25 ft (and slow to return to 110s with seated rest). Pt currently with functional limitations due to the deficits listed below (see PT Problem List).  Pt will benefit from skilled PT to increase their independence and safety with mobility to allow discharge to the venue listed below.       Follow Up Recommendations SNF;Supervision/Assistance - 24 hour (could not tolerate CIR)    Equipment Recommendations  None recommended by PT    Recommendations for Other Services       Precautions / Restrictions Precautions Precautions: Fall Precaution Comments: monitor HR      Mobility  Bed Mobility Overal bed mobility: Needs Assistance Bed Mobility: Supine to Sit     Supine to sit: Min assist     General bed mobility comments: HOB flat with incr effort to initiate (pop up to elbow) and ultimately required assist due to increasing fatigue  Transfers Overall transfer level: Needs assistance Equipment used: Rolling walker (2 wheeled) Transfers: Sit to/from Stand Sit to Stand: Min guard          General transfer comment: vc for safe use of RW when standing from chair without armrests (pt fatigued while walking in hall and had to grab closest chair to sit/rest)  Ambulation/Gait Ambulation/Gait assistance: Min guard Ambulation Distance (Feet): 15 Feet (stand rest; 25, seated rest; 35, seated rest) Assistive device: Rolling walker (2 wheeled) Gait Pattern/deviations: Step-through pattern;Decreased stride length;Trunk flexed;Shuffle   Gait velocity interpretation: Below normal speed for age/gender General Gait Details: as fatigues, he becomes more flexed and shuffles his feet; frequent breaks due to dyspnea and incr HR  Stairs            Wheelchair Mobility    Modified Rankin (Stroke Patients Only)       Balance Overall balance assessment: Needs assistance;History of Falls (last fall was 11/16) Sitting-balance support: No upper extremity supported;Feet supported Sitting balance-Leahy Scale: Fair     Standing balance support: No upper extremity supported Standing balance-Leahy Scale: Fair (can maintain, but increases anxiety)                               Pertinent Vitals/Pain See HR above Resting SaO2 supine 91% on room air Seated SaO2 97% With Walking 97%, however dyspnea 4/4 with having to bring a chair to pt for seated rest  Pain Assessment: No/denies pain    Home Living Family/patient expects to be discharged to:: Private residence Living Arrangements: Alone;Other (Comment) (grandaughter was living with him; doesn't know where she is) Available Help at  Discharge:  (if grandaughter returns, she works days and he is alone) Type of Home: Apartment Home Access: Stairs to enter Entrance Stairs-Rails: Right Entrance Stairs-Number of Steps: 5 Home Layout: One level Home Equipment: [place] - 2 wheels;Cane - single point;Adaptive equipment      Prior Function Level of Independence: Needs assistance   Gait / Transfers Assistance Needed:  walks with RW except for areas where his walker does not fit, he uses his cane; previously able to walk to mailbox, currently barely able to walk to kitchen  ADL's / Homemaking Assistance Needed: grandaughter has done shopping, picking up prescriptions; recently he has had to sit frequently to rest when trying to make his breakfast        Hand Dominance   Dominant Hand: Right    Extremity/Trunk Assessment   Upper Extremity Assessment: Defer to OT evaluation           Lower Extremity Assessment: Overall WFL for tasks assessed (does require UE assist to come to stand)      Cervical / Trunk Assessment: Kyphotic  Communication   Communication: HOH  Cognition Arousal/Alertness: Awake/alert Behavior During Therapy: Anxious;WFL for tasks assessed/performed (flights of anxiety and then settles with distraction) Overall Cognitive Status: Within Functional Limits for tasks assessed                      General Comments      Exercises        Assessment/Plan    PT Assessment Patient needs continued PT services  PT Diagnosis Difficulty walking   PT Problem List Decreased activity tolerance;Decreased balance;Decreased mobility;Decreased knowledge of use of DME;Cardiopulmonary status limiting activity;Obesity  PT Treatment Interventions DME instruction;Gait training;Functional mobility training;Therapeutic activities;Therapeutic exercise;Balance training;Patient/family education   PT Goals (Current goals can be found in the Care Plan section) Acute Rehab PT Goals Patient Stated Goal: get strong enough that he can make his breakfast (coffee and scrambled egg) PT Goal Formulation: With patient Time For Goal Achievement: 02/18/16 Potential to Achieve Goals: Good    Frequency Min 2X/week   Barriers to discharge Decreased caregiver support      Co-evaluation               End of Session Equipment Utilized During Treatment: Gait belt Activity Tolerance: Patient  limited by fatigue;Treatment limited secondary to medical complications (Comment) (elevated HR) Patient left: in chair;with call bell/phone within reach;with chair alarm set Nurse Communication: Mobility status;Other (comment) (HR up to 143, SaO2 at rest supine 91% & incr with activity)    Functional Assessment Tool Used: clinical judgement Functional Limitation: Mobility: Walking and moving around Mobility: Walking and Moving Around Current Status [id number]): At least 20 percent but less than 40 percent impaired, limited or restricted Mobility: Walking and Moving Around Goal Status [phone]): At least 1 percent but less than 20 percent impaired, limited or restricted    Time: 0852-0925 PT Time Calculation (min) (ACUTE ONLY): 33 min   Charges:   PT Evaluation $PT Eval Low Complexity: 1 Procedure PT Treatments $Gait Training: 8-22 mins   PT G Codes:   PT G-Codes **NOT FOR INPATIENT CLASS** Functional Assessment Tool Used: clinical judgement Functional Limitation: Mobility: Walking and moving around Mobility: Walking and Moving Around Current Status [id number]): At least 20 percent but less than 40 percent impaired, limited or restricted Mobility: Walking and Moving Around Goal Status [phone]): At least 1 percent but less than 20 percent impaired, limited or restricted    Lariza Cothron 02/04/2016,  9:48 AM Pager [phone]

## 2016-02-04 NOTE — Progress Notes (Signed)
Advanced Heart Failure Rounding Note  Referring Physician: Dr Renne Crigler Primary Physician: Nani Skillern PA-C Primary Cardiologist: Dr Haroldine Laws  Reason for Consultation: Heart Failure   Subjective:    Echo 02/03/16 LVEF 20-25%, Mod AI, Mod MR, Mod LAE/RAE, PA peak pressure 40 mm Hg.  Working with PT currently.  Had to stop and sit down with SOB walking down hall.  HR up into 130-140s with PT as well.  Coming back down as he rests. No CP.   I/O nearly even.  Weight stable (down 3 from admit). Creatinine stable.   Objective:   Weight Range: 255 lb (115.667 kg) Body mass index is 34.58 kg/(m^2).   Vital Signs:   Temp:  [97.8 F (36.6 C)-98.8 F (37.1 C)] 97.8 F (36.6 C) (05/18 0439) Pulse Rate:  [50-114] 63 (05/18 0439) Resp:  [17-21] 21 (05/18 0439) BP: (91-114)/(48-68) 112/68 mmHg (05/18 0439) SpO2:  [91 %-98 %] 91 % (05/18 0901) Weight:  [255 lb (115.667 kg)] 255 lb (115.667 kg) (05/18 0439) Last BM Date: 02/02/16  Weight change: Filed Weights   02/02/16 2130 02/03/16 0445 02/04/16 0439  Weight: 258 lb 1.6 oz (117.073 kg) 254 lb (115.214 kg) 255 lb (115.667 kg)    Intake/Output:   Intake/Output Summary (Last 24 hours) at 02/04/16 0911 Last data filed at 02/04/16 0449  Gross per 24 hour  Intake    600 ml  Output   1500 ml  Net   -900 ml     Physical Exam: General: Well appearing. NAD. Sitting in the chair.  HEENT: normal Neck: supple. JVP ~6-7 . Carotids 2+ bilat; no bruits. No thyromegaly or nodule noted Cor: PMI nondisplaced. Irregular rate & rhythm. No rubs, gallops or murmurs. Lungs: CTAB, normal effort Abdomen: obese. soft, NT, ND, no HSM. No bruits or masses. +BS  Extremities: no cyanosis, clubbing, rash, trace ankle edema Neuro: alert & orientedx3, cranial nerves grossly intact. moves all 4 extremities w/o difficulty. Affect pleasant  Telemetry: Afib, 80s for most of the morning, up into 120-140s while working with PT. Trending back down.    Labs: CBC  Recent Labs  02/02/16 1447 02/04/16 0437  WBC 6.2 6.9  HGB 12.7* 12.1*  HCT 38.7* 37.5*  MCV 87.4 87.0  PLT 197 [id number]   Basic Metabolic Panel  Recent Labs  02/03/16 0403 02/04/16 0437  NA 141 140  K 3.6 3.7  CL 103 100*  CO2 27 27  GLUCOSE 127* 139*  BUN 17 20  CREATININE 1.35* 1.43*  CALCIUM 8.9 8.9   Liver Function Tests No results for input(s): AST, ALT, ALKPHOS, BILITOT, PROT, ALBUMIN in the last 72 hours. No results for input(s): LIPASE, AMYLASE in the last 72 hours. Cardiac Enzymes No results for input(s): CKTOTAL, CKMB, CKMBINDEX, TROPONINI in the last 72 hours.  BNP: BNP (last 3 results)  Recent Labs  08/26/15 0510 12/15/15 2100 02/02/16 1447  BNP 1054.2* 481.6* 604.4*    ProBNP (last 3 results)  Recent Labs  10/06/15 1153 10/21/15 1106 01/05/16 1148  PROBNP 474.0* 318.0* 538.0*     D-Dimer No results for input(s): DDIMER in the last 72 hours. Hemoglobin A1C No results for input(s): HGBA1C in the last 72 hours. Fasting Lipid Panel No results for input(s): CHOL, HDL, LDLCALC, TRIG, CHOLHDL, LDLDIRECT in the last 72 hours. Thyroid Function Tests No results for input(s): TSH, T4TOTAL, T3FREE, THYROIDAB in the last 72 hours.  Invalid input(s): FREET3  Other results:     Imaging/Studies:  Dg Chest  2 View  02/02/2016  CLINICAL DATA:  Shortness of breath and productive cough EXAM: CHEST  2 VIEW COMPARISON:  01/05/2016 FINDINGS: Cardiac shadow is enlarged. The lungs are well aerated bilaterally. Diffuse interstitial changes are again identified without focal superimposed infiltrate. No sizable effusion is noted. IMPRESSION: Chronic changes without acute abnormality. Electronically Signed   By: Inez Catalina M.D.   On: 02/02/2016 15:17     Latest Echo  Latest Cath   Medications:     Scheduled Medications: . allopurinol  100 mg Oral BID  . apixaban  5 mg Oral BID  . carvedilol  3.125 mg Oral BID WC  . fluticasone  2  spray Each Nare Daily  . furosemide  60 mg Oral Q24H  . furosemide  80 mg Oral Daily  . insulin aspart  0-15 Units Subcutaneous TID WC  . losartan  12.5 mg Oral Daily  . mometasone-formoterol  2 puff Inhalation BID  . montelukast  10 mg Oral QHS  . pantoprazole  40 mg Oral Daily  . potassium chloride  10 mEq Oral TID  . simvastatin  20 mg Oral q1800  . sodium chloride flush  3 mL Intravenous Q12H  . spironolactone  12.5 mg Oral Daily  . tiotropium  18 mcg Inhalation Daily  . traZODone  25 mg Oral QHS     Infusions:     PRN Medications:  sodium chloride, acetaminophen, albuterol, sodium chloride flush   Assessment/Plan   Mr Therriault is well known to the HF team and has been followed for NICM. From HF perspective he seems well compensated. He does not appear volume overloaded. I think that his social situation may have played role in his dyspnea. His granddaughter lives with him but she has not been home in 4 days and may be in [employer]. She did the shopping and picked up his medications. Will likely need SNF  1. Dyspnea - Multifactorial given COPD and pulmonary fibrosis. CXR was ok. Does not appear volume overloaded.  2. Chronic Systolic HF- NICM ECHO 123XX123 EF 25-30% .  - Echo 02/03/16 LVEF 20-25%, Mod AI, Mod MR, Mod LAE/RAE, PA peak pressure 40 mm Hg. -> Slightly decreased from previous. On admit CXR and BNP ok. NYHA III. Volume status stable.  - Continue lasix 80 mg in am and 60 mg in the afternoon + 20 meq K daily.  - Continue coreg to 6.25 mg twice a day - Continue 12.5 mg losartan daily. Hold off on entresto for now with BP.  - Continue 12.5 mg spiro daily  2. Atrial Fibrillation-Rate controlled, though rate does increase with exercise. On eliquis. On carvedilol.  - Could eventually consider DC-CV but seems well compensated with rate control.  - Would likely not be good candidate for long term antiarrythmic with pulmonary fibrosis.  3. CAD- S/P D1  Stent -patent LHC 2017 On eliquis. On statin and BB 4. HTN-  - Stable on current regimen.  5. HLD- on statin 6. CKD stage III - Stable. Follow BMETs  7. COPD 8. OSA with CPAP- continue nightly  9. Pulmonary Fibrosis- followed in the community by Dr Halford Chessman.   Relatively stable from HF standpoint.  Favor rate control at this time as he is well compensated and would be poor candidate for long term antiarrythmic with PF.   If rate control ineffective, may need to involve EP.   PT to see and make recommendations.  Pt will likely need SNF.    Satira Mccallum  Nizar Cutler PA-C 02/04/2016, 9:11 AM  Advanced Heart Failure Team Pager [phone] (M-F; [place])  Please contact [facility] for night-coverage after hours (4p -7a ) and weekends on [email or web address]

## 2016-02-04 NOTE — NC FL2 (Signed)
[place] LEVEL OF CARE SCREENING TOOL     IDENTIFICATION  Patient Name: Christopher Holder Birthdate: [birth year] Sex: male Admission Date (Current Location): 02/02/2016  [facility] and [place] Number:  Herbalist and Address:  The . [facility], [place] [address] [place], [place] [address]      Provider Number: [id number]  Attending Physician Name and Address:  Caren Griffins, MD  Relative Name and Phone Number:       Current Level of Care: Hospital Recommended Level of Care: [place] Prior Approval Number:    Date Approved/Denied:   PASRR Number: [id number]  Discharge Plan: SNF    Current Diagnoses: Patient Active Problem List   Diagnosis Date Noted  . Chronic systolic CHF (congestive heart failure), NYHA class 4 ([place]) 02/02/2016  . Pulmonary fibrosis, postinflammatory ([place]) 02/02/2016  . DOE (dyspnea on exertion) 02/02/2016  . Asthma 01/15/2016  . CKD (chronic kidney disease), stage III 12/29/2015  . Renal failure (ARF), acute on chronic (HCC) 12/16/2015  . Hypoxia 12/15/2015  . Pain in lower limb 10/28/2015  . NICM (nonischemic cardiomyopathy) ([place]) 10/02/2015  . Abnormal nuclear stress test 10/01/2015  . Acute respiratory failure ([place])   . Community acquired pneumonia   . Cough with hemoptysis   . Sepsis ([place])   . Chronic systolic congestive heart failure ([place])   . Pulmonary infiltrate   . OSA on CPAP   . Hemoptysis 08/29/2015  . Persistent atrial fibrillation ([place]) 08/26/2015  . Severe sepsis ([place])   . ARDS (adult respiratory distress syndrome) ([place])   . Renal failure, acute on chronic (HCC)   . Acute systolic CHF (congestive heart failure) ([place])   . Acute respiratory failure with hypoxemia ([place])   . Systolic CHF, acute on chronic (HCC)   . Prolonged QT interval   . Sepsis due to pneumonia ([place]) 08/18/2015  . AKI (acute kidney injury) ([place]) 08/18/2015  . Metabolic encephalopathy [id number]  .  Thrombocytopenia ([place]) 08/18/2015  . Acute respiratory failure with hypoxia ([place]) 08/18/2015  . Hard of hearing 08/18/2015  . Chronic sinus bradycardia 08/18/2015  . CAP (community acquired pneumonia) 08/18/2015  . Acute pulmonary edema ([place]) 08/18/2015  . Septic shock ([place]) 08/18/2015  . Chronic cough 08/07/2015  . Onychomycosis 07/28/2015  . Injury by nail 07/28/2015  . Coronary artery disease involving native coronary artery of native heart without angina pectoris 06/19/2015  . Obesity 06/19/2015  . Obstructive sleep apnea 06/19/2015  . Other emphysema ([place]) 06/19/2015  . Diabetes mellitus type 2, controlled ([place]) 05/27/2015  . HTN (hypertension) 05/27/2015  . Hyperlipidemia 05/27/2015  . GERD (gastroesophageal reflux disease) 05/27/2015  . Allergic rhinitis 05/27/2015  . Diabetic neuropathy ([place]) 05/27/2015  . CHF (congestive heart failure) ([place]) 05/27/2015    Orientation RESPIRATION BLADDER Height & Weight     Self, Time, Situation, Place  Normal Continent Weight: 255 lb (115.667 kg) Height:  6' (182.9 cm)  BEHAVIORAL SYMPTOMS/MOOD NEUROLOGICAL BOWEL NUTRITION STATUS   (None)  (None) Continent Diet (Heart-healthy/carb-modified)  AMBULATORY STATUS COMMUNICATION OF NEEDS Skin   Limited Assist Verbally Normal                       Personal Care Assistance Level of Assistance  Bathing, Feeding, Dressing Bathing Assistance: Limited assistance Feeding assistance: Independent Dressing Assistance: Limited assistance     Functional Limitations Info  Sight, Hearing, Speech Sight Info: Adequate Hearing Info: Impaired Speech Info: Adequate    SPECIAL  CARE FACTORS FREQUENCY  Blood pressure, Diabetic urine testing, PT (By licensed PT), OT (By licensed OT)     PT Frequency: 5 x week OT Frequency: 5 x week            Contractures Contractures Info: Not present    Additional Factors Info  Code Status, Allergies Code Status Info: Full Allergies Info: Percoset,  Sulfa antibiotics           Current Medications (02/04/2016):  This is the current hospital active medication list Current Facility-Administered Medications  Medication Dose Route Frequency Provider Last Rate Last Dose  . 0.9 %  sodium chloride infusion  250 mL Intravenous PRN Etta Quill, DO      . acetaminophen (TYLENOL) tablet 650 mg  650 mg Oral Q4H PRN Etta Quill, DO   650 mg at 02/03/16 0533  . albuterol (PROVENTIL) (2.5 MG/3ML) 0.083% nebulizer solution 3 mL  3 mL Inhalation Q4H PRN Etta Quill, DO      . allopurinol (ZYLOPRIM) tablet 100 mg  100 mg Oral BID Etta Quill, DO   100 mg at 02/04/16 0907  . apixaban (ELIQUIS) tablet 5 mg  5 mg Oral BID Etta Quill, DO   5 mg at 02/04/16 0907  . carvedilol (COREG) tablet 3.125 mg  3.125 mg Oral BID WC Etta Quill, DO   3.125 mg at 02/04/16 0906  . fluticasone (FLONASE) 50 MCG/ACT nasal spray 2 spray  2 spray Each Nare Daily Etta Quill, DO   2 spray at 02/04/16 0908  . furosemide (LASIX) tablet 60 mg  60 mg Oral Q24H Etta Quill, DO   60 mg at 02/03/16 1747  . furosemide (LASIX) tablet 80 mg  80 mg Oral Daily Etta Quill, DO   80 mg at 02/04/16 0908  . insulin aspart (novoLOG) injection 0-15 Units  0-15 Units Subcutaneous TID WC Etta Quill, DO   3 Units at 02/03/16 1749  . losartan (COZAAR) tablet 12.5 mg  12.5 mg Oral Daily Etta Quill, DO   12.5 mg at 02/04/16 0907  . mometasone-formoterol (DULERA) 200-5 MCG/ACT inhaler 2 puff  2 puff Inhalation BID Etta Quill, DO   2 puff at 02/04/16 0900  . montelukast (SINGULAIR) tablet 10 mg  10 mg Oral QHS Etta Quill, DO   10 mg at 02/03/16 2152  . pantoprazole (PROTONIX) EC tablet 40 mg  40 mg Oral Daily Etta Quill, DO   40 mg at 02/04/16 0908  . potassium chloride (K-DUR,KLOR-CON) CR tablet 10 mEq  10 mEq Oral TID Etta Quill, DO   10 mEq at 02/04/16 [id number]  . simvastatin (ZOCOR) tablet 20 mg  20 mg Oral q1800 Etta Quill, DO   20 mg  at 02/03/16 1757  . sodium chloride flush (NS) 0.9 % injection 3 mL  3 mL Intravenous Q12H Etta Quill, DO   3 mL at 02/04/16 0909  . sodium chloride flush (NS) 0.9 % injection 3 mL  3 mL Intravenous PRN Etta Quill, DO      . spironolactone (ALDACTONE) tablet 12.5 mg  12.5 mg Oral Daily Caren Griffins, MD   12.5 mg at 02/04/16 0907  . tiotropium (SPIRIVA) inhalation capsule 18 mcg  18 mcg Inhalation Daily Etta Quill, DO   18 mcg at 02/04/16 0900  . traZODone (DESYREL) tablet 25 mg  25 mg Oral QHS Etta Quill, DO  25 mg at 02/03/16 2152     Discharge Medications: Please see discharge summary for a list of discharge medications.  Relevant Imaging Results:  Relevant Lab Results:   Additional Information SS#: [id number]  Candie Chroman, LCSW

## 2016-02-04 NOTE — Progress Notes (Signed)
RT set up patient CPAP mask. RT set CPAP on 15 home reg. No O2 bleed in needed. Patient is able to put mask on himself. He does not need any assistance. RT will continue to monitor as needed.

## 2016-02-04 NOTE — Evaluation (Signed)
Occupational Therapy Evaluation Patient Details Name: Christopher Holder MRN: [id number] DOB: 1930-08-17 Today's Date: 02/04/2016    History of Present Illness 82 y.o. male with medical history significant of systolic CHF with EF of 123XX123, post inflammatory pulmonary fibrosis following PNA with ARDS in Dec 2016, HTN, HLD, DM, CKD stage 3, OSA on CPAP, persistent A.Fib, admitted 5/16 with worsening dyspnea. At time of admission, His granddaughter lives with him but is not home much. She did the shopping and picked up his medications.   Clinical Impression   Pt was admitted for worsening dyspnea. He is normally mod I with adls but has been having more difficulty due to dyspnea.  He will benefit from continued OT to increase safety and independence with adls.  Pt currently needs min A overall, given lots of rest breaks as HR increases with activity. Goals in acute are for supervision.    Follow Up Recommendations  SNF    Equipment Recommendations   (tba for 3:1 commode vs none)    Recommendations for Other Services       Precautions / Restrictions Precautions Precautions: Fall Precaution Comments: monitor HR Restrictions Weight Bearing Restrictions: No      Mobility Bed Mobility Overal bed mobility: Needs Assistance Bed Mobility: Supine to Sit     Supine to sit: Min guard     General bed mobility comments: cues for not holding breath.  Used bedrail  Transfers Overall transfer level: Needs assistance Equipment used: Rolling walker (2 wheeled) Transfers: Sit to/from [employer] to Stand: Min guard Stand pivot transfers: Min guard       General transfer comment: cues for UE placement when getting up from bed    Balance Overall balance assessment: Needs assistance;History of Falls (last fall was 11/16) Sitting-balance support: No upper extremity supported;Feet supported Sitting balance-Leahy Scale: Fair     Standing balance support: No upper  extremity supported Standing balance-Leahy Scale: Fair (can maintain, but increases anxiety)                              ADL Overall ADL's : Needs assistance/impaired     Grooming: Set up;Sitting   Upper Body Bathing: Set up;Sitting   Lower Body Bathing: Minimal assistance;Sit to/from stand   Upper Body Dressing : Minimal assistance;Sitting   Lower Body Dressing: Minimal assistance;Sit to/from stand   Toilet Transfer: Minimal assistance;Stand-pivot;RW (recliner)   Toileting- Clothing Manipulation and Hygiene: Minimal assistance;Sit to/from stand         General ADL Comments: educated on energy conservation.  Pt needs rest breaks during all activities.  Pt is able to prop legs up on bed to reach feet for adls.   Pt's HR up to 140s earlier with PT.  Increased to 120 getting up to chair. Encouraged rest breaks between all activities     Vision     Perception     Praxis      Pertinent Vitals/Pain Pain Assessment: No/denies pain     Hand Dominance Right   Extremity/Trunk Assessment Upper Extremity Assessment Upper Extremity Assessment: Overall WFL for tasks assessed (ROM decreased but WFLs)      Cervical / Trunk Assessment Cervical / Trunk Assessment: Kyphotic   Communication Communication Communication: HOH   Cognition Arousal/Alertness: Awake/alert Behavior During Therapy: WFL for tasks assessed/performed Overall Cognitive Status: Within Functional Limits for tasks assessed  General Comments       Exercises       Shoulder Instructions      Home Living Family/patient expects to be discharged to:: Private residence Living Arrangements: Alone;Other (Comment) (granddaughter but only there a little bit of time) Available Help at Discharge: Family;Available PRN/intermittently Type of Home: Apartment Home Access: Stairs to enter Entrance Stairs-Number of Steps: 5 Entrance Stairs-Rails: Right Home Layout: One  level     Bathroom Shower/Tub: Teacher, early years/pre: Standard Bathroom Accessibility: No   Home Equipment: Environmental consultant - 2 wheels;Cane - single point;Adaptive equipment Adaptive Equipment: Reacher;Long-handled shoe horn        Prior Functioning/Environment Level of Independence: Needs assistance  Gait / Transfers Assistance Needed: walks with RW except for areas where his walker does not fit, he uses his cane; previously able to walk to mailbox, currently barely able to walk to kitchen ADL's / Homemaking Assistance Needed: grandaughter has done shopping, picking up prescriptions; recently he has had to sit frequently to rest when trying to make his breakfast        OT Diagnosis: Generalized weakness   OT Problem List: Decreased strength;Decreased activity tolerance;Impaired balance (sitting and/or standing);Cardiopulmonary status limiting activity   OT Treatment/Interventions: Self-care/ADL training;DME and/or AE instruction;Patient/family education;Balance training;Therapeutic activities    OT Goals(Current goals can be found in the care plan section) Acute Rehab OT Goals Patient Stated Goal: get strong enough that he can make his breakfast (coffee and scrambled egg) OT Goal Formulation: With patient Time For Goal Achievement: 02/11/16 Potential to Achieve Goals: Good ADL Goals Pt Will Perform Grooming: with supervision;standing Pt Will Transfer to Toilet: with supervision;ambulating;regular height toilet;bedside commode (vs) Additional ADL Goal #1: pt will complete adl with set up/supervision sit to stand and initiate at least two rest breaks for energy conservation Additional ADL Goal #2: pt will be independent with bed mobility in preparation for adls  OT Frequency: Min 2X/week   Barriers to D/C:            Co-evaluation              End of Session    Activity Tolerance: Patient limited by fatigue Patient left: in chair;with call bell/phone within  reach;with chair alarm set   Time: 1145-1200 OT Time Calculation (min): 15 min Charges:  OT General Charges $OT Visit: 1 Procedure OT Evaluation $OT Eval Moderate Complexity: 1 Procedure G-Codes:    Floria Brandau 02-27-2016, 12:33 PM Lesle Chris, OTR/L [phone] 2016-02-27

## 2016-02-04 NOTE — Clinical Social Work Note (Signed)
Clinical Social Work Assessment  Patient Details  Name: Christopher Holder MRN: [id number] Date of Birth: [birth year]  Date of referral:  02/04/16               Reason for consult:  Facility Placement, Discharge Planning                Permission sought to share information with:  Facility Sport and exercise psychologist, Family Supports Permission granted to share information::  Yes, Verbal Permission Granted  Name::     Edmonia Lynch  Agency::  SNF's  Relationship::  Daughter, Administrator Information:  P: [phone], J: [phone]  Housing/Transportation Living arrangements for the past 2 months:  Single Family Home Source of Information:  Patient, Medical Team Patient Interpreter Needed:  None Criminal Activity/Legal Involvement Pertinent to Current Situation/Hospitalization:  No - Comment as needed Significant Relationships:  Adult Children, Other(Comment) (Grandchild) Lives with:  Other (Comment) (Granddaughter) Do you feel safe going back to the place where you live?  Yes Need for family participation in patient care:  Yes (Comment)  Care giving concerns:  PT recommending SNF at discharge.   Social Worker assessment / plan:  CSW met with patient. No supports at bedside. CSW introduced role and explained that discharge planning would be discussed. CSW confirmed recommendation for SNF. Patient agreeable. Patient may be in need of PTAR if he cannot get a ride from his daughter or granddaughter on the day of discharge. No further concerns. CSW encouraged patient to contact CSW as needed. CSW will continue to follow patient and facilitate discharge to SNF once medically stable.  Employment status:  Retired Nurse, adult PT Recommendations:  [place] / Referral to community resources:  [place]  Patient/Family's Response to care:  Patient agreeable to SNF placement. At this time, it is not known whether his  family is actually supportive and involved in his care. Patient polite and appreciated social work intervention.  Patient/Family's Understanding of and Emotional Response to Diagnosis, Current Treatment, and Prognosis:  Patient understanding of medical interventions and aware of discharge to SNF once medically stable.  Emotional Assessment Appearance:  Appears stated age Attitude/Demeanor/Rapport:  Other (Pleasant) Affect (typically observed):  Accepting, Appropriate, Calm, Pleasant Orientation:  Oriented to Self, Oriented to Place, Oriented to  Time, Oriented to Situation Alcohol / Substance use:  Never Used Psych involvement (Current and /or in the community):  No (Comment)  Discharge Needs  Concerns to be addressed:  Care Coordination Readmission within the last 30 days:  No Current discharge risk:  Dependent with Mobility, Lives alone (Granddaughter works long hours) Barriers to Discharge:  No Barriers Identified   Candie Chroman, LCSW 02/04/2016, 11:34 AM

## 2016-02-04 NOTE — Consult Note (Signed)
   [facility] CM Inpatient Consult   02/04/2016  Andrick Gurkin 1929/10/29 [id number] Patient is currently active [prior to admission]  with [facility] for chronic disease management services.  Patient has been engaged by a [employer].  Patient was in patient care when attempting to speak with him and awaiting PT evaluation for possible skilled facility placement,  Patient was discussed in progression meeting regarding issues with need more assistance at home due to his granddaughter who works and potentially not currently available to assist him in the home. Awaiting disposition to determine post hospital/community needs with [facility] Care management..  Made Inpatient Case Manager aware that [place] [facility] following has been involved. . Of note, [facility] Care Management services does not replace or interfere with any services that are needed or arranged by inpatient case management or social work.  For additional questions or referrals please contact:  Natividad Brood, RN BSN [place] [facility] Liaison  [phone] business mobile phone Toll free office [phone]

## 2016-02-04 NOTE — Clinical Social Work Placement (Signed)
   CLINICAL SOCIAL WORK PLACEMENT  NOTE  Date:  02/04/2016  Patient Details  Name: Christopher Holder MRN: [id number] Date of Birth: April 21, 1930  Clinical Social Work is seeking post-discharge placement for this patient at the [place] level of care (*CSW will initial, date and re-position this form in  chart as items are completed):  Yes   Patient/family provided with [place] Work Department's list of facilities offering this level of care within the geographic area requested by the patient (or if unable, by the patient's family).  Yes   Patient/family informed of their freedom to choose among providers that offer the needed level of care, that participate in Medicare, Medicaid or managed care program needed by the patient, have an available bed and are willing to accept the patient.  Yes   Patient/family informed of [facility] [place]'s ownership interest in [facility] and [facility], as well as of the fact that they are under no obligation to receive care at these facilities.  PASRR submitted to EDS on       PASRR number received on       Existing PASRR number confirmed on 02/04/16     FL2 transmitted to all facilities in geographic area requested by pt/family on 02/04/16     FL2 transmitted to all facilities within larger geographic area on       Patient informed that his/her managed care company has contracts with or will negotiate with certain facilities, including the following:            Patient/family informed of bed offers received.  Patient chooses bed at       Physician recommends and patient chooses bed at      Patient to be transferred to   on  .  Patient to be transferred to facility by       Patient family notified on   of transfer.  Name of family member notified:        PHYSICIAN       Additional Comment:    _______________________________________________ Candie Chroman, LCSW 02/04/2016, 11:38 AM

## 2016-02-04 NOTE — Progress Notes (Signed)
OT Cancellation Note  Patient Details Name: Christopher Holder MRN: [id number] DOB: 1929-10-20   Cancelled Treatment:    Reason Eval/Treat Not Completed: Medical issues which prohibited therapy. Pt is tachy with HR in the 140s. Will check back.    Johncarlo Maalouf 02/04/2016, 10:33 AM  Lesle Chris, OTR/L [phone] 02/04/2016

## 2016-02-04 NOTE — Progress Notes (Signed)
PROGRESS NOTE  Christopher Holder [id number] DOB: 13-Sep-1930 DOA: 02/02/2016 PCP: Mackie Pai, PA-C     Brief Narrative: 82 y.o. male with medical history significant of systolic CHF with EF of 123XX123, post inflammatory pulmonary fibrosis following PNA with ARDS in Dec 2016, HTN, HLD, DM, CKD stage 3, OSA on CPAP, persistent A.Fib, admitted 5/16 with worsening dyspnea.  Assessment & Plan: Principal Problem:   DOE (dyspnea on exertion) Active Problems:   Diabetes mellitus type 2, controlled (HCC)   HTN (hypertension)   Persistent atrial fibrillation (HCC)   Chronic systolic CHF (congestive heart failure), NYHA class 4 (HCC)   Pulmonary fibrosis, postinflammatory (HCC)  Acute hypoxic respiratory failure due to acute on chronic combined CHF and pulmonary fibrosis - multifactorial CHF / underlying pulmonary fibrosis - 2D echo EF 20-25% - still quite symptomatic this morning when working with PT - consulted heart failure team, appreciate input - continue Lasix, Spironolactone, Coreg, Dulera, Spiriva  DM  - hold home glipizide, SSI  PAF - continue Coreg, Eliquis - rate control  HTN - continue home medications   CAD - stable no chest pain, continue current regimen   DVT prophylaxis: Eliquis Code Status: Full Family Communication: no family at bedside Disposition Plan: SNF when bed available  Consultants:   Cardiology   Procedures:  2D echo  Antimicrobials:  None    Subjective: - ongoing dyspnea while working with PT, had a hard time  Objective: Filed Vitals:   02/04/16 0439 02/04/16 0901 02/04/16 0902 02/04/16 1134  BP: 112/68  117/54 91/62  Pulse: 63  100 101  Temp: 97.8 F (36.6 C)  97.8 F (36.6 C) 97.9 F (36.6 C)  TempSrc: Oral  Oral Oral  Resp: 21   18  Height:      Weight: 115.667 kg (255 lb)     SpO2: 95% 91% 93% 93%    Intake/Output Summary (Last 24 hours) at 02/04/16 1338 Last data filed at 02/04/16 0900  Gross per 24 hour  Intake     680 ml  Output   1600 ml  Net   -920 ml   Filed Weights   02/02/16 2130 02/03/16 0445 02/04/16 0439  Weight: 117.073 kg (258 lb 1.6 oz) 115.214 kg (254 lb) 115.667 kg (255 lb)    Examination: Constitutional: NAD Filed Vitals:   02/04/16 0439 02/04/16 0901 02/04/16 0902 02/04/16 1134  BP: 112/68  117/54 91/62  Pulse: 63  100 101  Temp: 97.8 F (36.6 C)  97.8 F (36.6 C) 97.9 F (36.6 C)  TempSrc: Oral  Oral Oral  Resp: 21   18  Height:      Weight: 115.667 kg (255 lb)     SpO2: 95% 91% 93% 93%   Eyes: PERRL, lids and conjunctivae normal ENMT: Mucous membranes are moist. Poor dentition. Respiratory: clear to auscultation bilaterally, no wheezing, no crackles. Normal respiratory effort.  Cardiovascular: irregular. Trace lower extremity edema. 2+ pedal pulses. Abdomen: no tenderness. Bowel sounds positive.  Musculoskeletal: no clubbing / cyanosis. Neurologic: non focal    Data Reviewed: I have personally reviewed following labs and imaging studies  CBC:  Recent Labs Lab 02/02/16 1447 02/04/16 0437  WBC 6.2 6.9  HGB 12.7* 12.1*  HCT 38.7* 37.5*  MCV 87.4 87.0  PLT 197 [id number]   Basic Metabolic Panel:  Recent Labs Lab 02/02/16 1447 02/03/16 0403 02/04/16 0437  NA 137 141 140  K 3.2* 3.6 3.7  CL 100* 103 100*  CO2 26 27 27  GLUCOSE 109* 127* 139*  BUN 18 17 20   CREATININE 1.48* 1.35* 1.43*  CALCIUM 9.0 8.9 8.9   GFR: Estimated Creatinine Clearance: 51.4 mL/min (by C-G formula based on Cr of 1.43). Liver Function Tests: No results for input(s): AST, ALT, ALKPHOS, BILITOT, PROT, ALBUMIN in the last 168 hours. No results for input(s): LIPASE, AMYLASE in the last 168 hours. No results for input(s): AMMONIA in the last 168 hours. Coagulation Profile: No results for input(s): INR, PROTIME in the last 168 hours. Cardiac Enzymes: No results for input(s): CKTOTAL, CKMB, CKMBINDEX, TROPONINI in the last 168 hours. BNP (last 3 results)  Recent Labs   10/06/15 1153 10/21/15 1106 01/05/16 1148  PROBNP 474.0* 318.0* 538.0*   HbA1C: No results for input(s): HGBA1C in the last 72 hours. CBG:  Recent Labs Lab 02/03/16 1224 02/03/16 1654 02/03/16 2141 02/04/16 0548 02/04/16 1108  GLUCAP 125* 157* 137* 130* 154*   Lipid Profile: No results for input(s): CHOL, HDL, LDLCALC, TRIG, CHOLHDL, LDLDIRECT in the last 72 hours. Thyroid Function Tests: No results for input(s): TSH, T4TOTAL, FREET4, T3FREE, THYROIDAB in the last 72 hours. Anemia Panel: No results for input(s): VITAMINB12, FOLATE, FERRITIN, TIBC, IRON, RETICCTPCT in the last 72 hours. Urine analysis:    Component Value Date/Time   COLORURINE YELLOW 12/16/2015 0011   APPEARANCEUR CLEAR 12/16/2015 0011   LABSPEC 1.013 12/16/2015 0011   PHURINE 6.0 12/16/2015 0011   GLUCOSEU NEGATIVE 12/16/2015 0011   HGBUR NEGATIVE 12/16/2015 0011   BILIRUBINUR NEGATIVE 12/16/2015 0011   KETONESUR NEGATIVE 12/16/2015 0011   PROTEINUR NEGATIVE 12/16/2015 0011   NITRITE NEGATIVE 12/16/2015 0011   LEUKOCYTESUR NEGATIVE 12/16/2015 0011   Sepsis Labs: Invalid input(s): PROCALCITONIN, LACTICIDVEN  No results found for this or any previous visit (from the past 240 hour(s)).    Radiology Studies: Dg Chest 2 View  02/02/2016  CLINICAL DATA:  Shortness of breath and productive cough EXAM: CHEST  2 VIEW COMPARISON:  01/05/2016 FINDINGS: Cardiac shadow is enlarged. The lungs are well aerated bilaterally. Diffuse interstitial changes are again identified without focal superimposed infiltrate. No sizable effusion is noted. IMPRESSION: Chronic changes without acute abnormality. Electronically Signed   By: Inez Catalina M.D.   On: 02/02/2016 15:17     Scheduled Meds: . allopurinol  100 mg Oral BID  . apixaban  5 mg Oral BID  . carvedilol  3.125 mg Oral BID WC  . fluticasone  2 spray Each Nare Daily  . furosemide  60 mg Oral Q24H  . furosemide  80 mg Oral Daily  . insulin aspart  0-15 Units  Subcutaneous TID WC  . losartan  12.5 mg Oral Daily  . mometasone-formoterol  2 puff Inhalation BID  . montelukast  10 mg Oral QHS  . pantoprazole  40 mg Oral Daily  . potassium chloride  10 mEq Oral TID  . simvastatin  20 mg Oral q1800  . sodium chloride flush  3 mL Intravenous Q12H  . spironolactone  12.5 mg Oral Daily  . tiotropium  18 mcg Inhalation Daily  . traZODone  25 mg Oral QHS   Continuous Infusions:     Marzetta Board, MD, PhD [facility] Pager [phone]  If 7PM-7AM, please contact night-coverage [email or web address] Password [email or web address] 02/04/2016, 1:38 PM

## 2016-02-05 DIAGNOSIS — J8410 Pulmonary fibrosis, unspecified: Secondary | ICD-10-CM | POA: Diagnosis not present

## 2016-02-05 DIAGNOSIS — M6281 Muscle weakness (generalized): Secondary | ICD-10-CM | POA: Diagnosis not present

## 2016-02-05 DIAGNOSIS — I482 Chronic atrial fibrillation: Secondary | ICD-10-CM | POA: Diagnosis not present

## 2016-02-05 DIAGNOSIS — I4892 Unspecified atrial flutter: Secondary | ICD-10-CM | POA: Diagnosis not present

## 2016-02-05 DIAGNOSIS — Z7901 Long term (current) use of anticoagulants: Secondary | ICD-10-CM | POA: Diagnosis not present

## 2016-02-05 DIAGNOSIS — R918 Other nonspecific abnormal finding of lung field: Secondary | ICD-10-CM | POA: Diagnosis not present

## 2016-02-05 DIAGNOSIS — J9601 Acute respiratory failure with hypoxia: Secondary | ICD-10-CM | POA: Diagnosis not present

## 2016-02-05 DIAGNOSIS — K219 Gastro-esophageal reflux disease without esophagitis: Secondary | ICD-10-CM | POA: Diagnosis not present

## 2016-02-05 DIAGNOSIS — E1121 Type 2 diabetes mellitus with diabetic nephropathy: Secondary | ICD-10-CM | POA: Diagnosis not present

## 2016-02-05 DIAGNOSIS — I10 Essential (primary) hypertension: Secondary | ICD-10-CM | POA: Diagnosis not present

## 2016-02-05 DIAGNOSIS — I5043 Acute on chronic combined systolic (congestive) and diastolic (congestive) heart failure: Secondary | ICD-10-CM | POA: Diagnosis not present

## 2016-02-05 DIAGNOSIS — H9190 Unspecified hearing loss, unspecified ear: Secondary | ICD-10-CM | POA: Diagnosis not present

## 2016-02-05 DIAGNOSIS — I5040 Unspecified combined systolic (congestive) and diastolic (congestive) heart failure: Secondary | ICD-10-CM | POA: Diagnosis not present

## 2016-02-05 DIAGNOSIS — Z882 Allergy status to sulfonamides status: Secondary | ICD-10-CM | POA: Diagnosis not present

## 2016-02-05 DIAGNOSIS — E1122 Type 2 diabetes mellitus with diabetic chronic kidney disease: Secondary | ICD-10-CM | POA: Diagnosis not present

## 2016-02-05 DIAGNOSIS — T501X5A Adverse effect of loop [high-ceiling] diuretics, initial encounter: Secondary | ICD-10-CM | POA: Diagnosis not present

## 2016-02-05 DIAGNOSIS — I481 Persistent atrial fibrillation: Secondary | ICD-10-CM | POA: Diagnosis not present

## 2016-02-05 DIAGNOSIS — G4733 Obstructive sleep apnea (adult) (pediatric): Secondary | ICD-10-CM | POA: Diagnosis not present

## 2016-02-05 DIAGNOSIS — J441 Chronic obstructive pulmonary disease with (acute) exacerbation: Secondary | ICD-10-CM | POA: Diagnosis not present

## 2016-02-05 DIAGNOSIS — E669 Obesity, unspecified: Secondary | ICD-10-CM | POA: Diagnosis not present

## 2016-02-05 DIAGNOSIS — I2510 Atherosclerotic heart disease of native coronary artery without angina pectoris: Secondary | ICD-10-CM | POA: Diagnosis not present

## 2016-02-05 DIAGNOSIS — I472 Ventricular tachycardia: Secondary | ICD-10-CM | POA: Diagnosis not present

## 2016-02-05 DIAGNOSIS — J9620 Acute and chronic respiratory failure, unspecified whether with hypoxia or hypercapnia: Secondary | ICD-10-CM | POA: Diagnosis not present

## 2016-02-05 DIAGNOSIS — J449 Chronic obstructive pulmonary disease, unspecified: Secondary | ICD-10-CM | POA: Diagnosis not present

## 2016-02-05 DIAGNOSIS — G4700 Insomnia, unspecified: Secondary | ICD-10-CM | POA: Diagnosis not present

## 2016-02-05 DIAGNOSIS — J189 Pneumonia, unspecified organism: Secondary | ICD-10-CM | POA: Diagnosis not present

## 2016-02-05 DIAGNOSIS — Z96653 Presence of artificial knee joint, bilateral: Secondary | ICD-10-CM | POA: Diagnosis not present

## 2016-02-05 DIAGNOSIS — I129 Hypertensive chronic kidney disease with stage 1 through stage 4 chronic kidney disease, or unspecified chronic kidney disease: Secondary | ICD-10-CM | POA: Diagnosis not present

## 2016-02-05 DIAGNOSIS — E876 Hypokalemia: Secondary | ICD-10-CM | POA: Diagnosis not present

## 2016-02-05 DIAGNOSIS — J440 Chronic obstructive pulmonary disease with acute lower respiratory infection: Secondary | ICD-10-CM | POA: Diagnosis not present

## 2016-02-05 DIAGNOSIS — I5023 Acute on chronic systolic (congestive) heart failure: Secondary | ICD-10-CM | POA: Diagnosis not present

## 2016-02-05 DIAGNOSIS — E119 Type 2 diabetes mellitus without complications: Secondary | ICD-10-CM | POA: Diagnosis not present

## 2016-02-05 DIAGNOSIS — R0602 Shortness of breath: Secondary | ICD-10-CM | POA: Diagnosis present

## 2016-02-05 DIAGNOSIS — G4730 Sleep apnea, unspecified: Secondary | ICD-10-CM | POA: Diagnosis not present

## 2016-02-05 DIAGNOSIS — I130 Hypertensive heart and chronic kidney disease with heart failure and stage 1 through stage 4 chronic kidney disease, or unspecified chronic kidney disease: Secondary | ICD-10-CM | POA: Diagnosis not present

## 2016-02-05 DIAGNOSIS — Z515 Encounter for palliative care: Secondary | ICD-10-CM | POA: Diagnosis not present

## 2016-02-05 DIAGNOSIS — Z96659 Presence of unspecified artificial knee joint: Secondary | ICD-10-CM | POA: Diagnosis not present

## 2016-02-05 DIAGNOSIS — R05 Cough: Secondary | ICD-10-CM | POA: Diagnosis not present

## 2016-02-05 DIAGNOSIS — M1990 Unspecified osteoarthritis, unspecified site: Secondary | ICD-10-CM | POA: Diagnosis not present

## 2016-02-05 DIAGNOSIS — I447 Left bundle-branch block, unspecified: Secondary | ICD-10-CM | POA: Diagnosis not present

## 2016-02-05 DIAGNOSIS — K5900 Constipation, unspecified: Secondary | ICD-10-CM | POA: Diagnosis present

## 2016-02-05 DIAGNOSIS — M1A39X1 Chronic gout due to renal impairment, multiple sites, with tophus (tophi): Secondary | ICD-10-CM | POA: Diagnosis not present

## 2016-02-05 DIAGNOSIS — R0609 Other forms of dyspnea: Secondary | ICD-10-CM | POA: Diagnosis not present

## 2016-02-05 DIAGNOSIS — I252 Old myocardial infarction: Secondary | ICD-10-CM | POA: Diagnosis not present

## 2016-02-05 DIAGNOSIS — Z955 Presence of coronary angioplasty implant and graft: Secondary | ICD-10-CM | POA: Diagnosis not present

## 2016-02-05 DIAGNOSIS — I480 Paroxysmal atrial fibrillation: Secondary | ICD-10-CM | POA: Diagnosis not present

## 2016-02-05 DIAGNOSIS — I5022 Chronic systolic (congestive) heart failure: Secondary | ICD-10-CM | POA: Diagnosis not present

## 2016-02-05 DIAGNOSIS — Z79899 Other long term (current) drug therapy: Secondary | ICD-10-CM | POA: Diagnosis not present

## 2016-02-05 DIAGNOSIS — J45909 Unspecified asthma, uncomplicated: Secondary | ICD-10-CM | POA: Diagnosis not present

## 2016-02-05 DIAGNOSIS — J438 Other emphysema: Secondary | ICD-10-CM | POA: Diagnosis not present

## 2016-02-05 DIAGNOSIS — I5020 Unspecified systolic (congestive) heart failure: Secondary | ICD-10-CM | POA: Diagnosis not present

## 2016-02-05 DIAGNOSIS — E662 Morbid (severe) obesity with alveolar hypoventilation: Secondary | ICD-10-CM | POA: Diagnosis present

## 2016-02-05 DIAGNOSIS — Z885 Allergy status to narcotic agent status: Secondary | ICD-10-CM | POA: Diagnosis not present

## 2016-02-05 DIAGNOSIS — R262 Difficulty in walking, not elsewhere classified: Secondary | ICD-10-CM | POA: Diagnosis not present

## 2016-02-05 DIAGNOSIS — N183 Chronic kidney disease, stage 3 (moderate): Secondary | ICD-10-CM | POA: Diagnosis not present

## 2016-02-05 DIAGNOSIS — Z6836 Body mass index (BMI) 36.0-36.9, adult: Secondary | ICD-10-CM | POA: Diagnosis not present

## 2016-02-05 DIAGNOSIS — I429 Cardiomyopathy, unspecified: Secondary | ICD-10-CM | POA: Diagnosis not present

## 2016-02-05 DIAGNOSIS — Z7189 Other specified counseling: Secondary | ICD-10-CM | POA: Diagnosis not present

## 2016-02-05 DIAGNOSIS — R0600 Dyspnea, unspecified: Secondary | ICD-10-CM | POA: Diagnosis not present

## 2016-02-05 DIAGNOSIS — E785 Hyperlipidemia, unspecified: Secondary | ICD-10-CM | POA: Diagnosis not present

## 2016-02-05 LAB — GLUCOSE, CAPILLARY: Glucose-Capillary: 123 mg/dL — ABNORMAL HIGH (ref 65–99)

## 2016-02-05 LAB — BASIC METABOLIC PANEL
Anion gap: 10 (ref 5–15)
BUN: 18 mg/dL (ref 6–20)
CHLORIDE: 103 mmol/L (ref 101–111)
CO2: 29 mmol/L (ref 22–32)
CREATININE: 1.42 mg/dL — AB (ref 0.61–1.24)
Calcium: 8.9 mg/dL (ref 8.9–10.3)
GFR calc Af Amer: 51 mL/min — ABNORMAL LOW (ref >60)
GFR, EST NON AFRICAN AMERICAN: 44 mL/min — AB (ref >60)
GLUCOSE: 113 mg/dL — AB (ref 65–99)
POTASSIUM: 4 mmol/L (ref 3.5–5.1)
Sodium: 142 mmol/L (ref 135–145)

## 2016-02-05 MED ORDER — FUROSEMIDE 40 MG PO TABS: 60.0000 mg | ORAL_TABLET | Freq: Two times a day (BID) | ORAL | Status: DC

## 2016-02-05 NOTE — Discharge Summary (Signed)
Physician Discharge Summary  Christopher Holder [id number] DOB: [birth year] DOA: 02/02/2016  PCP: Mackie Pai, PA-C  Admit date: 02/02/2016 Discharge date: 02/05/2016  Time spent: > 30 minutes  Recommendations for Outpatient Follow-up:  1. Follow up with Cardiology as scheduled below   Discharge Diagnoses:  Principal Problem:   DOE (dyspnea on exertion) Active Problems:   Diabetes mellitus type 2, controlled ([place])   HTN (hypertension)   Persistent atrial fibrillation (HCC)   Chronic systolic CHF (congestive heart failure), NYHA class 4 (HCC)   Pulmonary fibrosis, postinflammatory (HCC)   Discharge Condition: stable  Diet recommendation: heart healthy  Filed Weights   02/03/16 0445 02/04/16 0439 02/05/16 0622  Weight: 115.214 kg (254 lb) 115.667 kg (255 lb) 115.577 kg (254 lb 12.8 oz)    History of present illness:  See H&P, Labs, Consult and Test reports for all details in brief, patient is a 82 y.o. male with medical history significant of systolic CHF with EF of 123XX123, post inflammatory pulmonary fibrosis following PNA with ARDS in Dec 2016, HTN, HLD, DM, CKD stage 3, OSA on CPAP, persistent A.Fib, admitted 5/16 with worsening dyspnea.  Hospital Course:  Acute hypoxic respiratory failure due to acute on chronic combined CHF and pulmonary fibrosis - multifactorial CHF / underlying pulmonary fibrosis. 2D echo EF 20-25%. Consulted heart failure team, appreciate input, patient was diuresed with Lasix with improvement in his breathing and energy levels, weight 258 >> 254 oon discharge. Discussed with cardiology, patient is stable for discharge to SNF level. Continue Lasix, Spironolactone, Coreg, Dulera, Spiriva DM  PAF - continue Coreg, Eliquis, rate control HTN - continue home medications  CAD - stable no chest pain, continue current regimen  Procedures:  2D echo  Study Conclusions  Left ventricle: The cavity size was normal. Systolic function was severely reduced. The  estimated ejection fraction was in the range of 20% to 25%. Diffuse hypokinesis. The study is not technically sufficient to allow evaluation of LV diastolic function.  Consultations:  Cardiology   Discharge Exam: Filed Vitals:   02/04/16 1724 02/04/16 2004 02/04/16 2043 02/05/16 0622  BP: 102/70  97/61 103/63  Pulse:  74 96 96  Temp:   98 F (36.7 C) 98 F (36.7 C)  TempSrc:   Oral Oral  Resp:  18 18   Height:      Weight:    115.577 kg (254 lb 12.8 oz)  SpO2:  94% 95% 94%    General: NAD Cardiovascular: RRR Respiratory: CTA biL  Discharge Instructions Activity:  As tolerated   Get Medicines reviewed and adjusted: Please take all your medications with you for your next visit with your Primary MD  Please request your Primary MD to go over all hospital tests and procedure/radiological results at the follow up, please ask your Primary MD to get all Hospital records sent to his/her office.  If you experience worsening of your admission symptoms, develop shortness of breath, life threatening emergency, suicidal or homicidal thoughts you must seek medical attention immediately by calling 911 or calling your MD immediately if symptoms less severe.  You must read complete instructions/literature along with all the possible adverse reactions/side effects for all the Medicines you take and that have been prescribed to you. Take any new Medicines after you have completely understood and accpet all the possible adverse reactions/side effects.   Do not drive when taking Pain medications.   Do not take more than prescribed Pain, Sleep and Anxiety Medications  Special Instructions: If you  have smoked or chewed Tobacco in the last 2 yrs please stop smoking, stop any regular Alcohol and or any Recreational drug use.  Wear Seat belts while driving.  Please note  You were cared for by a hospitalist during your hospital stay. Once you are discharged, your primary care physician will  handle any further medical issues. Please note that NO REFILLS for any discharge medications will be authorized once you are discharged, as it is imperative that you return to your primary care physician (or establish a relationship with a primary care physician if you do not have one) for your aftercare needs so that they can reassess your need for medications and monitor your lab values.    Medication List    TAKE these medications        albuterol 108 (90 Base) MCG/ACT inhaler  Commonly known as:  PROVENTIL HFA;VENTOLIN HFA  Inhale 2 puffs into the lungs every 4 (four) hours as needed for wheezing or shortness of breath.     allopurinol 100 MG tablet  Commonly known as:  ZYLOPRIM  Take 1 tablet by mouth two  times daily     carvedilol 3.125 MG tablet  Commonly known as:  COREG  1 tab po bid     DULERA 200-5 MCG/ACT Aero  Generic drug:  mometasone-formoterol  Use 2 puffs two times daily     ELIQUIS 5 MG Tabs tablet  Generic drug:  apixaban  Take 1 tablet (5 mg total) by mouth 2 (two) times daily.     fluticasone 50 MCG/ACT nasal spray  Commonly known as:  FLONASE  Use 2 sprays in each  nostril daily     furosemide 40 MG tablet  Commonly known as:  LASIX  Take 1.5-2 tablets (60-80 mg total) by mouth 2 (two) times daily. 80 mg by mouth in the morning and 60 mg six hours later     glipiZIDE 5 MG tablet  Commonly known as:  GLUCOTROL  Take 1 tablet (5 mg total) by mouth 2 (two) times daily before a meal. Reported on 12/29/2015     losartan 25 MG tablet  Commonly known as:  COZAAR  Take 0.5 tablets (12.5 mg total) by mouth daily.     montelukast 10 MG tablet  Commonly known as:  SINGULAIR  Take 1 tablet by mouth at  bedtime     NON FORMULARY  CPAP at night (setting is at 15)     omeprazole 20 MG capsule  Commonly known as:  PRILOSEC  Take 1 capsule by mouth  daily     potassium chloride 10 MEQ tablet  Commonly known as:  K-DUR  Take 1 tablet (10 mEq total) by mouth  3 (three) times daily.     simvastatin 20 MG tablet  Commonly known as:  ZOCOR  Take 1 tablet by mouth  daily     tiotropium 18 MCG inhalation capsule  Commonly known as:  SPIRIVA  Place 1 capsule (18 mcg total) into inhaler and inhale daily.     traZODone 50 MG tablet  Commonly known as:  DESYREL  1/2 tab po q hs           Follow-up Information    Follow up with [facility] .   Specialty:  Skilled Nursing Facility   Contact information:   [address] [place] [phone]      Follow up with [facility] On  02/10/2016.   Specialty:  Cardiology   Why:  at 1100 for post hospital follow up. Please bring all of your medications or MAR to your visit. the code for parking is 0002.   Contact information:   [address] [id number] [place] [phone]      The results of significant diagnostics from this hospitalization (including imaging, microbiology, ancillary and laboratory) are listed below for reference.    Significant Diagnostic Studies: Dg Chest 2 View  02/02/2016  CLINICAL DATA:  Shortness of breath and productive cough EXAM: CHEST  2 VIEW COMPARISON:  01/05/2016 FINDINGS: Cardiac shadow is enlarged. The lungs are well aerated bilaterally. Diffuse interstitial changes are again identified without focal superimposed infiltrate. No sizable effusion is noted. IMPRESSION: Chronic changes without acute abnormality. Electronically Signed   By: Inez Catalina M.D.   On: 02/02/2016 15:17    Microbiology: No results found for this or any previous visit (from the past 240 hour(s)).   Labs: Basic Metabolic Panel:  Recent Labs Lab 02/02/16 1447 02/03/16 0403 02/04/16 0437 02/05/16 0631  NA 137 141 140 142  K 3.2* 3.6 3.7 4.0  CL 100* 103 100* 103  CO2 26 27 27 29   GLUCOSE 109* 127* 139* 113*  BUN 18 17 20 18   CREATININE 1.48* 1.35* 1.43* 1.42*    CALCIUM 9.0 8.9 8.9 8.9   Liver Function Tests: No results for input(s): AST, ALT, ALKPHOS, BILITOT, PROT, ALBUMIN in the last 168 hours. No results for input(s): LIPASE, AMYLASE in the last 168 hours. No results for input(s): AMMONIA in the last 168 hours. CBC:  Recent Labs Lab 02/02/16 1447 02/04/16 0437  WBC 6.2 6.9  HGB 12.7* 12.1*  HCT 38.7* 37.5*  MCV 87.4 87.0  PLT 197 189   Cardiac Enzymes: No results for input(s): CKTOTAL, CKMB, CKMBINDEX, TROPONINI in the last 168 hours. BNP: BNP (last 3 results)  Recent Labs  08/26/15 0510 12/15/15 2100 02/02/16 1447  BNP 1054.2* 481.6* 604.4*    ProBNP (last 3 results)  Recent Labs  10/06/15 1153 10/21/15 1106 01/05/16 1148  PROBNP 474.0* 318.0* 538.0*    CBG:  Recent Labs Lab 02/04/16 0548 02/04/16 1108 02/04/16 1559 02/04/16 2046 02/05/16 0619  GLUCAP 130* 154* 108* 152* 123*       Signed:  Alvis Edgell  [facility] 02/05/2016, 10:26 AM

## 2016-02-05 NOTE — Clinical Social Work Note (Signed)
Discharge summary sent to facility over hub. Admissions aware that patient will be coming there today and that daughter will transport. RN will call facility for report [phone]).  CSW signing off. Consult if any other social work needs arise.  Dayton Scrape, [place]

## 2016-02-05 NOTE — Progress Notes (Signed)
Patient's daughter given copy of AVS.  Report attempted to [facility] but nurse was on break and left my phone number to call for report.  Patient discharged via wheelchair with daughter to bring to [facility].

## 2016-02-05 NOTE — Care Management Obs Status (Signed)
[facility] NOTIFICATION   Patient Details  Name: Christopher Holder MRN: [id number] Date of Birth: 06-11-1930   Medicare Observation Status Notification Given:  Yes    Royston Bake, RN 02/05/2016, 10:49 AM

## 2016-02-08 ENCOUNTER — Encounter: Payer: Self-pay | Admitting: *Deleted

## 2016-02-08 ENCOUNTER — Other Ambulatory Visit: Payer: Self-pay

## 2016-02-08 DIAGNOSIS — I509 Heart failure, unspecified: Secondary | ICD-10-CM

## 2016-02-08 NOTE — Consult Note (Signed)
Documentation of order placed for [facility] Social Worker.  Natividad Brood, RN BSN [facility] Liaison  [phone] business mobile phone Toll free office [phone]

## 2016-02-10 ENCOUNTER — Ambulatory Visit (HOSPITAL_COMMUNITY)
Admission: RE | Admit: 2016-02-10 | Discharge: 2016-02-10 | Disposition: A | Discharge status: Home / Self Care | Payer: Medicare Other | Source: Ambulatory Visit | Attending: Cardiology | Admitting: Cardiology

## 2016-02-10 VITALS — BP 102/56 | HR 93 | Wt 264.8 lb

## 2016-02-10 DIAGNOSIS — Z7901 Long term (current) use of anticoagulants: Secondary | ICD-10-CM | POA: Diagnosis not present

## 2016-02-10 DIAGNOSIS — H9190 Unspecified hearing loss, unspecified ear: Secondary | ICD-10-CM | POA: Diagnosis not present

## 2016-02-10 DIAGNOSIS — I129 Hypertensive chronic kidney disease with stage 1 through stage 4 chronic kidney disease, or unspecified chronic kidney disease: Secondary | ICD-10-CM | POA: Insufficient documentation

## 2016-02-10 DIAGNOSIS — E785 Hyperlipidemia, unspecified: Secondary | ICD-10-CM | POA: Diagnosis not present

## 2016-02-10 DIAGNOSIS — E669 Obesity, unspecified: Secondary | ICD-10-CM | POA: Diagnosis not present

## 2016-02-10 DIAGNOSIS — N183 Chronic kidney disease, stage 3 unspecified: Secondary | ICD-10-CM

## 2016-02-10 DIAGNOSIS — M1990 Unspecified osteoarthritis, unspecified site: Secondary | ICD-10-CM | POA: Diagnosis not present

## 2016-02-10 DIAGNOSIS — J8410 Pulmonary fibrosis, unspecified: Secondary | ICD-10-CM | POA: Insufficient documentation

## 2016-02-10 DIAGNOSIS — I2510 Atherosclerotic heart disease of native coronary artery without angina pectoris: Secondary | ICD-10-CM | POA: Insufficient documentation

## 2016-02-10 DIAGNOSIS — I252 Old myocardial infarction: Secondary | ICD-10-CM | POA: Insufficient documentation

## 2016-02-10 DIAGNOSIS — J449 Chronic obstructive pulmonary disease, unspecified: Secondary | ICD-10-CM | POA: Diagnosis not present

## 2016-02-10 DIAGNOSIS — G4733 Obstructive sleep apnea (adult) (pediatric): Secondary | ICD-10-CM | POA: Insufficient documentation

## 2016-02-10 DIAGNOSIS — G4730 Sleep apnea, unspecified: Secondary | ICD-10-CM | POA: Diagnosis not present

## 2016-02-10 DIAGNOSIS — K219 Gastro-esophageal reflux disease without esophagitis: Secondary | ICD-10-CM | POA: Insufficient documentation

## 2016-02-10 DIAGNOSIS — I429 Cardiomyopathy, unspecified: Secondary | ICD-10-CM | POA: Diagnosis not present

## 2016-02-10 DIAGNOSIS — I5022 Chronic systolic (congestive) heart failure: Secondary | ICD-10-CM | POA: Insufficient documentation

## 2016-02-10 DIAGNOSIS — Z79899 Other long term (current) drug therapy: Secondary | ICD-10-CM | POA: Diagnosis not present

## 2016-02-10 DIAGNOSIS — J45909 Unspecified asthma, uncomplicated: Secondary | ICD-10-CM | POA: Diagnosis not present

## 2016-02-10 DIAGNOSIS — E119 Type 2 diabetes mellitus without complications: Secondary | ICD-10-CM | POA: Insufficient documentation

## 2016-02-10 LAB — BASIC METABOLIC PANEL
Anion gap: 8 (ref 5–15)
BUN: 19 mg/dL (ref 6–20)
CHLORIDE: 104 mmol/L (ref 101–111)
CO2: 28 mmol/L (ref 22–32)
CREATININE: 1.23 mg/dL (ref 0.61–1.24)
Calcium: 9 mg/dL (ref 8.9–10.3)
GFR calc Af Amer: >60 mL/min (ref >60)
GFR calc non Af Amer: 52 mL/min — ABNORMAL LOW (ref >60)
Glucose, Bld: 109 mg/dL — ABNORMAL HIGH (ref 65–99)
Potassium: 3.8 mmol/L (ref 3.5–5.1)
Sodium: 140 mmol/L (ref 135–145)

## 2016-02-10 MED ORDER — FUROSEMIDE 40 MG PO TABS: 80.0000 mg | ORAL_TABLET | Freq: Two times a day (BID) | ORAL | Status: DC

## 2016-02-10 NOTE — Patient Instructions (Signed)
INCREASE Lasix to 80 mg, twice a day  Labs today  Your physician recommends that you schedule a follow-up appointment in: 4 weeks

## 2016-02-10 NOTE — Progress Notes (Signed)
Patient ID: Christopher Holder, male   DOB: 1929/12/12, 82 y.o.   MRN: [id number]  Primary Physician: Christopher Pai, PA-C Primary Cardiologist: Dr Christopher Holder Primary EP: Dr Christopher Holder HF MD: Dr Christopher Holder  HPI: Christopher Holder is a 82 y.o. male with history of CAD s/p D1 stent (patent on repeat cath 123XX123), chronic systolic HF, ECHO 123XX123 EF 25-30%, HTN, HLD, DM, CKD, OSA with CPAP, Paroxysmal Afib, and COPD.   Admitted late March 2017 with volume ovelrload. Diuresed with IV lasix and transitioned to lasix 80 mg in am and 40 mg in pm. Noted to be in A fib during his hospitalization with controlled rate. Discharge weight was 259 pounds. Discharged to [place].   Admitted to [facility] with increased dyspnea May 16th, 2017. Dyspnea not thought to be related to volume overload but COPD. He later discharged to [place].   Today he returns for HF follow up. Overall feeling ok.  Ongoing dyspnea on exertion. Denies PND/Orthopnea. He is currently at [facility]. Says he is not being weighed. Not on low salt diet.   Echo 08/21/15 LVEF 25-30%, Severe diffuse HK, Moderate MR, PA peak pressure 41 mm Hg  Nuclear Stress 09/25/15 - Indications, with elevated troponins with acute illness and low EF on echo. Showed EF 36%, High risk study with severe LVE suggestion of TID. Small inferior wall infarct from apex to base.   LHC 10/02/15 with normal coronaries and widely patent D1 stent, no culprit lesions for previous HIGH RISK stress test. Elevated LVEDP and severe LV systolic dysfunction.  ROS: All systems negative except as listed in HPI, PMH and Problem List.  SH:  Social History   Social History  . Marital Status: Widowed    Spouse Name: [name]  . Number of Children: [name]  . Years of Education: [name]   Occupational History  . retired    Social History Main Topics  . Smoking status: Never Smoker   . Smokeless tobacco: Never Used  . Alcohol Use: No  . Drug Use: No  . Sexual Activity: Not on file    Other Topics Concern  . Not on file   Social History Narrative    FH:  Family History  Problem Relation Age of Onset  . Cancer Mother   . Diabetes Father   . Heart attack Brother   . Heart attack Sister     Past Medical History  Diagnosis Date  . Diabetes mellitus without complication ([place])   . CHF (congestive heart failure) ([place])   . Coronary artery disease   . COPD (chronic obstructive pulmonary disease) ([place])   . Asthma   . Hypertension   . HOH (hard of hearing)   . Sleep apnea   . Myocardial infarction ([place])   . Hyperlipidemia 05/27/2015  . GERD (gastroesophageal reflux disease) 05/27/2015  . AKI (acute kidney injury) ([place]) 08/18/2015  . Arthritis   . Pulmonary fibrosis ([place])     Current Outpatient Prescriptions  Medication Sig Dispense Refill  . atorvastatin (LIPITOR) 10 MG tablet Take 10 mg by mouth daily.    . carvedilol (COREG) 3.125 MG tablet 1 tab po bid (Patient taking differently: Take 3.125 mg by mouth 2 (two) times daily. ) 180 tablet 3  . DULERA 200-5 MCG/ACT AERO Use 2 puffs two times daily 26 g 1  . ELIQUIS 5 MG TABS tablet Take 1 tablet (5 mg total) by mouth 2 (two) times daily. 120 tablet 2  . fluticasone (FLONASE) 50 MCG/ACT  nasal spray Use 2 sprays in each  nostril daily 32 g 1  . furosemide (LASIX) 40 MG tablet Take 1.5-2 tablets (60-80 mg total) by mouth 2 (two) times daily. 80 mg by mouth in the morning and 60 mg six hours later 120 tablet 1  . glipiZIDE (GLUCOTROL) 5 MG tablet Take 1 tablet (5 mg total) by mouth 2 (two) times daily before a meal. Reported on 12/29/2015 180 tablet 3  . losartan (COZAAR) 25 MG tablet Take 25 mg by mouth daily.    . metolazone (ZAROXOLYN) 2.5 MG tablet Take 2.5 mg by mouth daily.    . montelukast (SINGULAIR) 10 MG tablet Take 1 tablet by mouth at  bedtime 90 tablet 0  . omeprazole (PRILOSEC) 20 MG capsule Take 1 capsule by mouth  daily 60 capsule 1  . potassium chloride SA (K-DUR,KLOR-CON) 20 MEQ tablet Take 40 mEq  by mouth daily.    . simvastatin (ZOCOR) 20 MG tablet Take 1 tablet by mouth  daily 90 tablet 1  . tiotropium (SPIRIVA) 18 MCG inhalation capsule Place 1 capsule (18 mcg total) into inhaler and inhale daily. 30 capsule 12  . albuterol (PROVENTIL HFA;VENTOLIN HFA) 108 (90 BASE) MCG/ACT inhaler Inhale 2 puffs into the lungs every 4 (four) hours as needed for wheezing or shortness of breath.     . allopurinol (ZYLOPRIM) 100 MG tablet Take 1 tablet by mouth two  times daily 120 tablet 1  . NON FORMULARY CPAP at night (setting is at 15)    . traZODone (DESYREL) 50 MG tablet 1/2 tab po q hs (Patient taking differently: Take 25 mg by mouth at bedtime. ) 15 tablet 3   No current facility-administered medications for this encounter.    Filed Vitals:   02/10/16 1035  BP: 102/56  Pulse: 93  Weight: 264 lb 12.8 oz (120.112 kg)  SpO2: 94%    PHYSICAL EXAM: General:  Well appearing. No resp difficulty. Arrived in a wheelchair.  Granddaughter present.  HEENT: normal Neck: supple. JVP 9-10 . Carotids 2+ bilaterally; no bruits. No lymphadenopathy or thryomegaly appreciated. Cor: PMI normal. Irregular rate & rhythm. No rubs, gallops or murmurs. Lungs: clear Abdomen: obese, soft, nontender, nondistended. No hepatosplenomegaly. No bruits or masses. Good bowel sounds. Extremities: no cyanosis, clubbing, rash, edema Neuro: alert & orientedx3, cranial nerves grossly intact. Moves all 4 extremities w/o difficulty. Affect pleasant.  ASSESSMENT & PLAN: 1. Chronic Systolic HF- NICM ECHO 123XX123 EF 25-30% . Plan to repeat ECHO after HF meds optimized at least 3 months. Likely July.  NYHA II-III. Volume status mildly elelvated. Increase lasix to 80 mg twice a day + 20 meq K daily. BMET today.  - Continue coreg to 3.125 mg twice a day.  -Continue losartan 25 mg daily.   - Consider spiro next visit. Provided SNF with orders to limit fluid intake to < 2 liters , daily weight, and to provide low salt diet.    2.   Atrial Fibrillation- In A fib today. Rate controlled on low dose carvedilol. . On eliquis. No bleeding problems.  On eliquis. On carvedilol. Can consider DC-CV but seems well compensated with rate control.  3.  CAD- S/P D1 Stent -patent LHC 2017 On eliquis. On statin and BB 4.  HTN- Controlled continue current regimen.  5.  HLD- on statin 6.  CKD stage III- check BMET today.  7. COPD 8. OSA with CPAP- continue nightly  9. CKD Stage III- BMET today  10. Pulmonary Fibrosis-  Followed in the community by Dr Halford Chessman.   Follow up in 4 weeks for medication titration.   Lenita Peregrina NP-C  10:53 AM

## 2016-02-11 DIAGNOSIS — J449 Chronic obstructive pulmonary disease, unspecified: Secondary | ICD-10-CM | POA: Diagnosis not present

## 2016-02-11 DIAGNOSIS — I429 Cardiomyopathy, unspecified: Secondary | ICD-10-CM | POA: Diagnosis not present

## 2016-02-11 DIAGNOSIS — I5020 Unspecified systolic (congestive) heart failure: Secondary | ICD-10-CM | POA: Diagnosis not present

## 2016-02-11 DIAGNOSIS — I480 Paroxysmal atrial fibrillation: Secondary | ICD-10-CM | POA: Diagnosis not present

## 2016-02-16 DIAGNOSIS — G4733 Obstructive sleep apnea (adult) (pediatric): Secondary | ICD-10-CM | POA: Diagnosis not present

## 2016-02-16 DIAGNOSIS — G4700 Insomnia, unspecified: Secondary | ICD-10-CM | POA: Diagnosis not present

## 2016-02-24 DIAGNOSIS — I5040 Unspecified combined systolic (congestive) and diastolic (congestive) heart failure: Secondary | ICD-10-CM | POA: Diagnosis not present

## 2016-02-24 DIAGNOSIS — J8410 Pulmonary fibrosis, unspecified: Secondary | ICD-10-CM | POA: Diagnosis not present

## 2016-02-24 DIAGNOSIS — R05 Cough: Secondary | ICD-10-CM | POA: Diagnosis not present

## 2016-02-25 ENCOUNTER — Other Ambulatory Visit: Payer: Self-pay | Admitting: *Deleted

## 2016-02-25 ENCOUNTER — Telehealth: Payer: Self-pay | Admitting: *Deleted

## 2016-02-25 NOTE — Patient Outreach (Signed)
[place] [facility]) Care Management  02/25/2016  Christopher Holder 1930-09-18 [id number]   Follow up phone call to Lance Morin, discharge planner at [facility] to confirm patient's discharge plan.  Discharge planner not available, voicemail message left requesting a return call.   Sheralyn Boatman [facility] Care Management [phone]

## 2016-02-25 NOTE — Patient Outreach (Addendum)
[place] [facility]) Care Management  02/25/2016  Jaysean Cossin [birth year] [id number]   This social worker is providing coverage for assigned social worker Nat Christen, [place] in her absence to follow up on patient's status in rehab at [facility].  Phone call to [facility], spoke with Lance Morin the discharge planner who stated that patient is set  to discharge on 02/26/16.  However patient's daughter is requesting that patient discharge to a assisted living.  Per [place]ers, patient has been accepted at [facility] assisted living, however patient's daughter has declined this bed offer and would like patient to be placed closer to her home in [place].  At this time the discharge plan has not been determined as patient's daughter has declined the  bed offer at [facility].  Marilynne Drivers will talk to patient's daughter today to finalize discharge plan.  At this time, there have been no additional bed offers other than [facility].   Plan: This social worker will follow up with the discharge planner at [facility] later on this afternoon regarding patient's discharge plan for 02/26/16.   This Education officer, museum will update RNCM.    Sheralyn Boatman [facility] Care Management [phone]

## 2016-02-25 NOTE — Patient Outreach (Signed)
[place]) Care Management  02/25/2016  Christopher Holder 11-09-1929 [id number]   Return call from Lance Morin, discharge planner from [facility].  Patient's grand daughter has visited [facility] and the family has agreed for patient to transition there.  Per Marilynne Drivers, patient has been accepted, however a bed will not be available for approximately 1 week.  Patient will be going home with his daughter on 02/26/16.  Home Health has been ordered through [place] for physical therapy and nursing. However will not start until early next week.  Plan:  This social worker will inform RNCM of patient's discharge plans.   Sheralyn Boatman [facility] Care Management [phone]

## 2016-02-25 NOTE — Telephone Encounter (Signed)
Forwarded to [employer]. JG//CMA

## 2016-02-26 ENCOUNTER — Inpatient Hospital Stay (HOSPITAL_COMMUNITY)
Admission: EM | Admit: 2016-02-26 | Discharge: 2016-03-04 | DRG: 291 | Disposition: A | Discharge status: Skilled Nursing Facility | Payer: Medicare Other | Attending: Family Medicine | Admitting: Family Medicine

## 2016-02-26 ENCOUNTER — Encounter (HOSPITAL_COMMUNITY): Payer: Self-pay

## 2016-02-26 ENCOUNTER — Emergency Department (HOSPITAL_COMMUNITY): Payer: Medicare Other

## 2016-02-26 ENCOUNTER — Ambulatory Visit: Payer: Self-pay | Admitting: *Deleted

## 2016-02-26 DIAGNOSIS — Z96653 Presence of artificial knee joint, bilateral: Secondary | ICD-10-CM | POA: Diagnosis present

## 2016-02-26 DIAGNOSIS — J9601 Acute respiratory failure with hypoxia: Secondary | ICD-10-CM | POA: Diagnosis present

## 2016-02-26 DIAGNOSIS — E785 Hyperlipidemia, unspecified: Secondary | ICD-10-CM | POA: Diagnosis present

## 2016-02-26 DIAGNOSIS — J438 Other emphysema: Secondary | ICD-10-CM | POA: Diagnosis not present

## 2016-02-26 DIAGNOSIS — K219 Gastro-esophageal reflux disease without esophagitis: Secondary | ICD-10-CM | POA: Diagnosis not present

## 2016-02-26 DIAGNOSIS — I472 Ventricular tachycardia: Secondary | ICD-10-CM | POA: Diagnosis not present

## 2016-02-26 DIAGNOSIS — R05 Cough: Secondary | ICD-10-CM | POA: Diagnosis not present

## 2016-02-26 DIAGNOSIS — J441 Chronic obstructive pulmonary disease with (acute) exacerbation: Secondary | ICD-10-CM | POA: Diagnosis not present

## 2016-02-26 DIAGNOSIS — J440 Chronic obstructive pulmonary disease with acute lower respiratory infection: Secondary | ICD-10-CM | POA: Diagnosis present

## 2016-02-26 DIAGNOSIS — Z7189 Other specified counseling: Secondary | ICD-10-CM | POA: Diagnosis not present

## 2016-02-26 DIAGNOSIS — E1122 Type 2 diabetes mellitus with diabetic chronic kidney disease: Secondary | ICD-10-CM | POA: Diagnosis present

## 2016-02-26 DIAGNOSIS — I4892 Unspecified atrial flutter: Secondary | ICD-10-CM | POA: Diagnosis not present

## 2016-02-26 DIAGNOSIS — Z7901 Long term (current) use of anticoagulants: Secondary | ICD-10-CM | POA: Diagnosis not present

## 2016-02-26 DIAGNOSIS — R0600 Dyspnea, unspecified: Secondary | ICD-10-CM

## 2016-02-26 DIAGNOSIS — Z515 Encounter for palliative care: Secondary | ICD-10-CM | POA: Diagnosis not present

## 2016-02-26 DIAGNOSIS — T501X5A Adverse effect of loop [high-ceiling] diuretics, initial encounter: Secondary | ICD-10-CM | POA: Diagnosis present

## 2016-02-26 DIAGNOSIS — J189 Pneumonia, unspecified organism: Secondary | ICD-10-CM | POA: Diagnosis not present

## 2016-02-26 DIAGNOSIS — E662 Morbid (severe) obesity with alveolar hypoventilation: Secondary | ICD-10-CM | POA: Diagnosis present

## 2016-02-26 DIAGNOSIS — I5023 Acute on chronic systolic (congestive) heart failure: Secondary | ICD-10-CM

## 2016-02-26 DIAGNOSIS — I2510 Atherosclerotic heart disease of native coronary artery without angina pectoris: Secondary | ICD-10-CM | POA: Diagnosis not present

## 2016-02-26 DIAGNOSIS — Z955 Presence of coronary angioplasty implant and graft: Secondary | ICD-10-CM | POA: Diagnosis not present

## 2016-02-26 DIAGNOSIS — R918 Other nonspecific abnormal finding of lung field: Secondary | ICD-10-CM | POA: Diagnosis not present

## 2016-02-26 DIAGNOSIS — I252 Old myocardial infarction: Secondary | ICD-10-CM

## 2016-02-26 DIAGNOSIS — Z6836 Body mass index (BMI) 36.0-36.9, adult: Secondary | ICD-10-CM

## 2016-02-26 DIAGNOSIS — I480 Paroxysmal atrial fibrillation: Secondary | ICD-10-CM | POA: Diagnosis not present

## 2016-02-26 DIAGNOSIS — I5040 Unspecified combined systolic (congestive) and diastolic (congestive) heart failure: Secondary | ICD-10-CM | POA: Diagnosis not present

## 2016-02-26 DIAGNOSIS — I130 Hypertensive heart and chronic kidney disease with heart failure and stage 1 through stage 4 chronic kidney disease, or unspecified chronic kidney disease: Principal | ICD-10-CM | POA: Diagnosis present

## 2016-02-26 DIAGNOSIS — I4729 Other ventricular tachycardia: Secondary | ICD-10-CM | POA: Insufficient documentation

## 2016-02-26 DIAGNOSIS — N183 Chronic kidney disease, stage 3 (moderate): Secondary | ICD-10-CM | POA: Diagnosis present

## 2016-02-26 DIAGNOSIS — K5900 Constipation, unspecified: Secondary | ICD-10-CM | POA: Diagnosis present

## 2016-02-26 DIAGNOSIS — I5022 Chronic systolic (congestive) heart failure: Secondary | ICD-10-CM | POA: Diagnosis not present

## 2016-02-26 DIAGNOSIS — J449 Chronic obstructive pulmonary disease, unspecified: Secondary | ICD-10-CM | POA: Insufficient documentation

## 2016-02-26 DIAGNOSIS — J8410 Pulmonary fibrosis, unspecified: Secondary | ICD-10-CM | POA: Diagnosis not present

## 2016-02-26 DIAGNOSIS — E876 Hypokalemia: Secondary | ICD-10-CM | POA: Diagnosis not present

## 2016-02-26 DIAGNOSIS — H9190 Unspecified hearing loss, unspecified ear: Secondary | ICD-10-CM | POA: Diagnosis present

## 2016-02-26 DIAGNOSIS — R059 Cough, unspecified: Secondary | ICD-10-CM

## 2016-02-26 DIAGNOSIS — E1121 Type 2 diabetes mellitus with diabetic nephropathy: Secondary | ICD-10-CM | POA: Diagnosis not present

## 2016-02-26 DIAGNOSIS — R0602 Shortness of breath: Secondary | ICD-10-CM | POA: Diagnosis not present

## 2016-02-26 DIAGNOSIS — I509 Heart failure, unspecified: Secondary | ICD-10-CM

## 2016-02-26 LAB — BASIC METABOLIC PANEL
ANION GAP: 9 (ref 5–15)
BUN: 22 mg/dL — ABNORMAL HIGH (ref 6–20)
CHLORIDE: 100 mmol/L — AB (ref 101–111)
CO2: 26 mmol/L (ref 22–32)
Calcium: 9.1 mg/dL (ref 8.9–10.3)
Creatinine, Ser: 1.38 mg/dL — ABNORMAL HIGH (ref 0.61–1.24)
GFR, EST AFRICAN AMERICAN: 53 mL/min — AB (ref >60)
GFR, EST NON AFRICAN AMERICAN: 46 mL/min — AB (ref >60)
Glucose, Bld: 128 mg/dL — ABNORMAL HIGH (ref 65–99)
POTASSIUM: 4.1 mmol/L (ref 3.5–5.1)
SODIUM: 135 mmol/L (ref 135–145)

## 2016-02-26 LAB — GLUCOSE, CAPILLARY: Glucose-Capillary: 108 mg/dL — ABNORMAL HIGH (ref 65–99)

## 2016-02-26 LAB — CBC
HCT: 37.8 % — ABNORMAL LOW (ref 39.0–52.0)
Hemoglobin: 12 g/dL — ABNORMAL LOW (ref 13.0–17.0)
MCH: 27.8 pg (ref 26.0–34.0)
MCHC: 31.7 g/dL (ref 30.0–36.0)
MCV: 87.5 fL (ref 78.0–100.0)
PLATELETS: 191 10*3/uL (ref 150–400)
RBC: 4.32 MIL/uL (ref 4.22–5.81)
RDW: 18.2 % — AB (ref 11.5–15.5)
WBC: 7.4 10*3/uL (ref 4.0–10.5)

## 2016-02-26 LAB — D-DIMER, QUANTITATIVE (NOT AT ARMC): D DIMER QUANT: 0.52 ug{FEU}/mL — AB (ref 0.00–0.50)

## 2016-02-26 LAB — I-STAT TROPONIN, ED: TROPONIN I, POC: 0.02 ng/mL (ref 0.00–0.08)

## 2016-02-26 LAB — BRAIN NATRIURETIC PEPTIDE: B NATRIURETIC PEPTIDE 5: 999.3 pg/mL — AB (ref 0.0–100.0)

## 2016-02-26 LAB — TROPONIN I: TROPONIN I: 0.03 ng/mL (ref <0.031)

## 2016-02-26 MED ORDER — LEVALBUTEROL HCL 1.25 MG/0.5ML IN NEBU
1.2500 mg | INHALATION_SOLUTION | Freq: Once | RESPIRATORY_TRACT | Status: AC
  Administered 2016-02-26: 1.25 mg via RESPIRATORY_TRACT
  Filled 2016-02-26: qty 0.5

## 2016-02-26 MED ORDER — DOXYCYCLINE HYCLATE 100 MG PO TABS
100.0000 mg | ORAL_TABLET | Freq: Two times a day (BID) | ORAL | Status: DC
  Administered 2016-02-26 – 2016-03-04 (×14): 100 mg via ORAL
  Filled 2016-02-26 (×14): qty 1

## 2016-02-26 MED ORDER — TRAZODONE HCL 50 MG PO TABS
25.0000 mg | ORAL_TABLET | Freq: Every day | ORAL | Status: DC
  Administered 2016-02-26 – 2016-03-03 (×7): 25 mg via ORAL
  Filled 2016-02-26 (×7): qty 1

## 2016-02-26 MED ORDER — POTASSIUM CHLORIDE CRYS ER 20 MEQ PO TBCR
40.0000 meq | EXTENDED_RELEASE_TABLET | Freq: Every day | ORAL | Status: DC
  Administered 2016-02-27 – 2016-03-04 (×7): 40 meq via ORAL
  Filled 2016-02-26 (×8): qty 2

## 2016-02-26 MED ORDER — INSULIN ASPART 100 UNIT/ML ~~LOC~~ SOLN
0.0000 [IU] | Freq: Three times a day (TID) | SUBCUTANEOUS | Status: DC
  Administered 2016-02-27 (×2): 1 [IU] via SUBCUTANEOUS

## 2016-02-26 MED ORDER — MOMETASONE FURO-FORMOTEROL FUM 200-5 MCG/ACT IN AERO
2.0000 | INHALATION_SPRAY | Freq: Two times a day (BID) | RESPIRATORY_TRACT | Status: DC
  Administered 2016-02-27 – 2016-03-04 (×13): 2 via RESPIRATORY_TRACT
  Filled 2016-02-26 (×2): qty 8.8

## 2016-02-26 MED ORDER — SODIUM CHLORIDE 0.9 % IV SOLN: 250.0000 mL | INTRAVENOUS | Status: DC | PRN

## 2016-02-26 MED ORDER — SODIUM CHLORIDE 0.9% FLUSH
3.0000 mL | Freq: Two times a day (BID) | INTRAVENOUS | Status: DC
  Administered 2016-02-26 – 2016-03-04 (×14): 3 mL via INTRAVENOUS

## 2016-02-26 MED ORDER — ASPIRIN EC 81 MG PO TBEC
81.0000 mg | DELAYED_RELEASE_TABLET | Freq: Every day | ORAL | Status: DC
  Administered 2016-02-26 – 2016-03-04 (×8): 81 mg via ORAL
  Filled 2016-02-26 (×8): qty 1

## 2016-02-26 MED ORDER — SODIUM CHLORIDE 0.9% FLUSH: 3.0000 mL | INTRAVENOUS | Status: DC | PRN

## 2016-02-26 MED ORDER — GUAIFENESIN 100 MG/5ML PO SOLN
200.0000 mg | ORAL | Status: DC | PRN
  Administered 2016-02-27 – 2016-03-04 (×14): 200 mg via ORAL
  Filled 2016-02-26 (×6): qty 10
  Filled 2016-02-26 (×2): qty 5
  Filled 2016-02-26 (×2): qty 10
  Filled 2016-02-26: qty 5
  Filled 2016-02-26: qty 10
  Filled 2016-02-26: qty 5
  Filled 2016-02-26 (×3): qty 10

## 2016-02-26 MED ORDER — FUROSEMIDE 10 MG/ML IJ SOLN
60.0000 mg | Freq: Two times a day (BID) | INTRAMUSCULAR | Status: DC
  Administered 2016-02-26 – 2016-02-29 (×7): 60 mg via INTRAVENOUS
  Filled 2016-02-26 (×7): qty 6

## 2016-02-26 MED ORDER — ALLOPURINOL 100 MG PO TABS
100.0000 mg | ORAL_TABLET | Freq: Two times a day (BID) | ORAL | Status: DC
  Administered 2016-02-26 – 2016-03-04 (×14): 100 mg via ORAL
  Filled 2016-02-26 (×14): qty 1

## 2016-02-26 MED ORDER — MONTELUKAST SODIUM 10 MG PO TABS
10.0000 mg | ORAL_TABLET | Freq: Every day | ORAL | Status: DC
  Administered 2016-02-26 – 2016-03-03 (×7): 10 mg via ORAL
  Filled 2016-02-26 (×7): qty 1

## 2016-02-26 MED ORDER — ENOXAPARIN SODIUM 40 MG/0.4ML ~~LOC~~ SOLN: 40.0000 mg | SUBCUTANEOUS | Status: DC

## 2016-02-26 MED ORDER — APIXABAN 5 MG PO TABS
5.0000 mg | ORAL_TABLET | Freq: Two times a day (BID) | ORAL | Status: DC
  Administered 2016-02-26 – 2016-02-28 (×4): 5 mg via ORAL
  Filled 2016-02-26 (×4): qty 1

## 2016-02-26 MED ORDER — ATORVASTATIN CALCIUM 10 MG PO TABS
10.0000 mg | ORAL_TABLET | Freq: Every day | ORAL | Status: DC
  Administered 2016-02-26 – 2016-03-03 (×7): 10 mg via ORAL
  Filled 2016-02-26 (×7): qty 1

## 2016-02-26 MED ORDER — IPRATROPIUM-ALBUTEROL 20-100 MCG/ACT IN AERS: 1.0000 | INHALATION_SPRAY | Freq: Four times a day (QID) | RESPIRATORY_TRACT | Status: DC

## 2016-02-26 MED ORDER — TIOTROPIUM BROMIDE MONOHYDRATE 18 MCG IN CAPS
18.0000 ug | ORAL_CAPSULE | Freq: Every day | RESPIRATORY_TRACT | Status: DC
  Administered 2016-02-27 – 2016-03-04 (×7): 18 ug via RESPIRATORY_TRACT
  Filled 2016-02-26: qty 5

## 2016-02-26 MED ORDER — IPRATROPIUM BROMIDE 0.02 % IN SOLN
0.5000 mg | Freq: Once | RESPIRATORY_TRACT | Status: AC
  Administered 2016-02-26: 0.5 mg via RESPIRATORY_TRACT
  Filled 2016-02-26: qty 2.5

## 2016-02-26 MED ORDER — LOSARTAN POTASSIUM 25 MG PO TABS
25.0000 mg | ORAL_TABLET | Freq: Every day | ORAL | Status: DC
  Administered 2016-02-27 – 2016-03-04 (×7): 25 mg via ORAL
  Filled 2016-02-26 (×7): qty 1

## 2016-02-26 MED ORDER — CARVEDILOL 3.125 MG PO TABS
3.1250 mg | ORAL_TABLET | Freq: Two times a day (BID) | ORAL | Status: DC
  Administered 2016-02-27 (×2): 3.125 mg via ORAL
  Filled 2016-02-26 (×3): qty 1

## 2016-02-26 MED ORDER — PANTOPRAZOLE SODIUM 40 MG PO TBEC
40.0000 mg | DELAYED_RELEASE_TABLET | Freq: Every day | ORAL | Status: DC
  Administered 2016-02-27 – 2016-03-04 (×7): 40 mg via ORAL
  Filled 2016-02-26 (×7): qty 1

## 2016-02-26 MED ORDER — IPRATROPIUM-ALBUTEROL 0.5-2.5 (3) MG/3ML IN SOLN
3.0000 mL | Freq: Four times a day (QID) | RESPIRATORY_TRACT | Status: DC
  Administered 2016-02-26 – 2016-02-27 (×4): 3 mL via RESPIRATORY_TRACT
  Filled 2016-02-26 (×4): qty 3

## 2016-02-26 MED ORDER — FLUTICASONE PROPIONATE 50 MCG/ACT NA SUSP
2.0000 | Freq: Every day | NASAL | Status: DC
  Administered 2016-02-27 – 2016-03-04 (×7): 2 via NASAL
  Filled 2016-02-26: qty 16

## 2016-02-26 NOTE — ED Notes (Signed)
Attempted report 

## 2016-02-26 NOTE — ED Provider Notes (Signed)
CSN: [id number]     Arrival date & time 02/26/16  1647 History   First MD Initiated Contact with Patient 02/26/16 1834     Chief Complaint  Patient presents with  . Shortness of Breath     (Consider location/radiation/quality/duration/timing/severity/associated sxs/prior Treatment) HPI   Kaire Stary is a 82 y.o. male Presents for evaluation of shortness of breath which is worse on any type of exertion, which worsened today, after he got home from rehabilitation.  He reports having a cough productive of yellow sputum, for 3 months.  He was hospitalized, about 3 and half weeks ago, and was discharged from rehabilitation, today  after a stay of about 2 weeks.he denies chest pain, focal weakness or dizziness.His family members with him state that he was more active at home today, after discharge from rehabilitation, and that may have contributed to the difficulty breathing. There are no other known modifying factors.  Past Medical History  Diagnosis Date  . Diabetes mellitus without complication ([place])   . CHF (congestive heart failure) ([place])   . Coronary artery disease   . COPD (chronic obstructive pulmonary disease) ([place])   . Asthma   . Hypertension   . HOH (hard of hearing)   . Sleep apnea   . Myocardial infarction ([place])   . Hyperlipidemia 05/27/2015  . GERD (gastroesophageal reflux disease) 05/27/2015  . AKI (acute kidney injury) ([place]) 08/18/2015  . Arthritis   . Pulmonary fibrosis [facility])    Past Surgical History  Procedure Laterality Date  . Coronary angioplasty with stent placement    . Knee surgery    . Total knee arthroplasty      Knee replacement x2  . Cardioversion [name] 08/28/2015    Procedure: CARDIOVERSION;  Surgeon: Jerline Pain, MD;  Location: [place];  Service: Cardiovascular;  Laterality: [name];  . Cardiac catheterization [name] 10/02/2015    Procedure: Left Heart Cath and Coronary Angiography;  Surgeon: Leonie Man, MD;  Location: [place] [facility];  Service:  Cardiovascular;  Laterality: [name];  . Joint replacement     Family History  Problem Relation Age of Onset  . Cancer Mother   . Diabetes Father   . Heart attack Brother   . Heart attack Sister    Social History  Substance Use Topics  . Smoking status: Never Smoker   . Smokeless tobacco: Never Used  . Alcohol Use: No    Review of Systems  All other systems reviewed and are negative.     Allergies  Percocet and Sulfa antibiotics  Home Medications   Prior to Admission medications   Medication Sig Start Date End Date Taking? Authorizing Provider  albuterol (PROVENTIL HFA;VENTOLIN HFA) 108 (90 BASE) MCG/ACT inhaler Inhale 2 puffs into the lungs every 4 (four) hours as needed for wheezing or shortness of breath.     Historical Provider, MD  allopurinol (ZYLOPRIM) 100 MG tablet Take 1 tablet by mouth two  times daily 12/25/15   Mackie Pai, PA-C  atorvastatin (LIPITOR) 10 MG tablet Take 10 mg by mouth daily.    Historical Provider, MD  carvedilol (COREG) 3.125 MG tablet 1 tab po bid Patient taking differently: Take 3.125 mg by mouth 2 (two) times daily.  01/19/16   Mackie Pai, PA-C  DULERA 200-5 MCG/ACT AERO Use 2 puffs two times daily 11/02/15   Edward [name], PA-C  ELIQUIS 5 MG TABS tablet Take 1 tablet (5 mg total) by mouth 2 (two) times daily. 10/12/15   Mackie Pai,  PA-C  fluticasone (FLONASE) 50 MCG/ACT nasal spray Use 2 sprays in each  nostril daily 11/13/15   Midge Minium, MD  furosemide (LASIX) 40 MG tablet Take 2 tablets (80 mg total) by mouth 2 (two) times daily. 02/10/16   Amy D Ninfa Meeker, NP  glipiZIDE (GLUCOTROL) 5 MG tablet Take 1 tablet (5 mg total) by mouth 2 (two) times daily before a meal. Reported on 12/29/2015 01/19/16 01/18/17  Mackie Pai, PA-C  losartan (COZAAR) 25 MG tablet Take 25 mg by mouth daily.    Historical Provider, MD  metolazone (ZAROXOLYN) 2.5 MG tablet Take 2.5 mg by mouth daily.    Historical Provider, MD  montelukast (SINGULAIR) 10 MG tablet  Take 1 tablet by mouth at  bedtime 11/10/15   Mackie Pai, PA-C  NON FORMULARY CPAP at night (setting is at 15)    Historical Provider, MD  omeprazole (PRILOSEC) 20 MG capsule Take 1 capsule by mouth  daily 12/25/15   Mackie Pai, PA-C  potassium chloride SA (K-DUR,KLOR-CON) 20 MEQ tablet Take 40 mEq by mouth daily.    Historical Provider, MD  simvastatin (ZOCOR) 20 MG tablet Take 1 tablet by mouth  daily 01/05/16   Mackie Pai, PA-C  tiotropium (SPIRIVA) 18 MCG inhalation capsule Place 1 capsule (18 mcg total) into inhaler and inhale daily. 09/22/15   Percell Miller [name], PA-C  traZODone (DESYREL) 50 MG tablet 1/2 tab po q hs Patient taking differently: Take 25 mg by mouth at bedtime.  01/19/16   Edward [name], PA-C   BP 104/62 mmHg  Pulse 104  Temp(Src) 97.3 F (36.3 C) (Oral)  Resp 20  Ht 6' (1.829 m)  Wt 269 lb 2 oz (122.074 kg)  BMI 36.49 kg/m2  SpO2 96% Physical Exam  Constitutional: He is oriented to person, place, and time. He appears well-developed.  Elderly, frail  HENT:  Head: Normocephalic and atraumatic.  Right Ear: External ear normal.  Left Ear: External ear normal.  Eyes: Conjunctivae and EOM are normal. Pupils are equal, round, and reactive to light.  Neck: Normal range of motion and phonation normal. Neck supple.  Cardiovascular: Normal rate, regular rhythm and normal heart sounds.   Pulmonary/Chest: Effort normal. No respiratory distress. He exhibits no bony tenderness.  Scattered rhonchi and few wheezes. Air movement, somewhat diminished at the bases bilaterally.  Abdominal: Soft. He exhibits no distension. There is no tenderness.  Musculoskeletal: Normal range of motion. He exhibits edema (3+ bilateral lower legs.). He exhibits no tenderness.  Neurological: He is alert and oriented to person, place, and time. No cranial nerve deficit or sensory deficit. He exhibits normal muscle tone. Coordination normal.  Skin: Skin is warm, dry and intact.  Psychiatric: He has a  normal mood and affect. His behavior is normal.  Nursing note and vitals reviewed.   ED Course  Procedures (including critical care time)  Medications  ipratropium (ATROVENT) nebulizer solution 0.5 mg (not administered)  levalbuterol (XOPENEX) nebulizer solution 1.25 mg (not administered)    Patient Vitals for the past 24 hrs:  BP Temp Temp src Pulse Resp SpO2 Height Weight  02/26/16 2030 104/62 mmHg - - 104 20 96 % - -  02/26/16 2000 100/65 mmHg - - 105 17 97 % - -  02/26/16 1930 102/63 mmHg - - 106 22 97 % - -  02/26/16 1830 100/59 mmHg - - 99 18 100 % - -  02/26/16 1657 106/57 mmHg 97.3 F (36.3 C) Oral 96 22 97 % 6' (1.829 m)  269 lb 2 oz (122.074 kg)   8:48 PM-Consult complete with Hospitalist. Patient case explained and discussed. He agrees to admit patient for further evaluation and treatment. Call ended at 2055    8:36 PM Reevaluation with update and discussion. After initial assessment and treatment, an updated evaluation reveals He remains short of breath and uncomfortable. Findings discussed with patient, all questions answered. [place] Review Labs Reviewed  BASIC METABOLIC PANEL - Abnormal; Notable for the following:    Chloride 100 (*)    Glucose, Bld 128 (*)    BUN 22 (*)    Creatinine, Ser 1.38 (*)    GFR calc non Af Amer 46 (*)    GFR calc Af Amer 53 (*)    All other components within normal limits  CBC - Abnormal; Notable for the following:    Hemoglobin 12.0 (*)    HCT 37.8 (*)    RDW 18.2 (*)    All other components within normal limits  D-DIMER, QUANTITATIVE (NOT AT [facility]) - Abnormal; Notable for the following:    D-Dimer, Quant 0.52 (*)    All other components within normal limits  BRAIN NATRIURETIC PEPTIDE - Abnormal; Notable for the following:    B Natriuretic Peptide 999.3 (*)    All other components within normal limits  I-STAT TROPOININ, ED   Component     Latest Ref Rng 02/10/2016 02/26/2016           Sodium     135 - 145  mmol/L 140 135  Potassium     3.5 - 5.1 mmol/L 3.8 4.1  Chloride     101 - 111 mmol/L 104 100 (L)  CO2     22 - 32 mmol/L 28 26  Glucose     65 - 99 mg/dL 109 (H) 128 (H)  BUN     6 - 20 mg/dL 19 22 (H)  Creatinine     0.61 - 1.24 mg/dL 1.23 1.38 (H)  Calcium     8.9 - 10.3 mg/dL 9.0 9.1  EGFR (Non-African Amer.)     >60 mL/min 52 (L) 46 (L)  EGFR (African American)     >60 mL/min >60 53 (L)  Anion gap     5 - 15 8 9    Component     Latest Ref Rng 02/04/2016 02/26/2016           WBC     4.0 - 10.5 K/uL 6.9 7.4  RBC     4.22 - 5.81 MIL/uL 4.31 4.32  Hemoglobin     13.0 - 17.0 g/dL 12.1 (L) 12.0 (L)  HCT     39.0 - 52.0 % 37.5 (L) 37.8 (L)  MCV     78.0 - 100.0 fL 87.0 87.5  MCH     26.0 - 34.0 pg 28.1 27.8  MCHC     30.0 - 36.0 g/dL 32.3 31.7  RDW     11.5 - 15.5 % 18.0 (H) 18.2 (H)  Platelets     150 - 400 K/uL 189 191   Component     Latest Ref Rng 02/02/2016 02/26/2016  B Natriuretic Peptide     0.0 - 100.0 pg/mL 604.4 (H) 999.3 (H)   Filed Weights   02/26/16 1657  Weight: 269 lb 2 oz (122.074 kg)    Imaging Review Dg Chest 2 View  02/26/2016  CLINICAL DATA:  Shortness of breath and leg swelling EXAM: CHEST  2 VIEW  COMPARISON:  Feb 02, 2016 FINDINGS: Cardiomegaly with edema and small effusions. More focal opacity seen in the lateral right lung, possibly developing infiltrate. Probable atelectasis in the left base. No other changes. IMPRESSION: Cardiomegaly and edema. Focal opacity in the right lateral lung. Recommend follow-up to resolution. Electronically Signed   By: Dorise Bullion III M.D   On: 02/26/2016 19:01   I have personally reviewed and evaluated these images and lab results as part of my medical decision-making.   EKG Interpretation   Date/Time:  Friday February 26 2016 16:57:57 EDT Ventricular Rate:  103 PR Interval:    QRS Duration: 130 QT Interval:  394 QTC Calculation: 516 R Axis:   -44 Text Interpretation:  Atrial fibrillation with rapid  ventricular response  with premature ventricular or aberrantly conducted complexes Left axis  deviation Left bundle branch block Abnormal ECG since last tracing no  significant change Confirmed by Eulis Foster  MD, Vira Agar ([id number]) on 02/26/2016  6:36:23 PM      MDM   Final diagnoses:  Acute on chronic systolic congestive heart failure (HCC)  Chronic obstructive pulmonary disease, unspecified COPD type (HCC)  Cough  Pulmonary fibrosis (HCC)    Shortness of breath with dyspnea on exertion, which he describes as severe. Patient is moderately anxious. No hypoxia, on evaluation. He was recently hospitalized, then sent to rehabilitation, and was discharged today. During the hospitalization he was evaluated for and treated for heart failure, but the providers felt that his dyspnea on exertion, was likely more associated with his COPD and pulmonary fibrosis, than active heart failure. Today his weight, is 7 kg, greater than when he was discharged from the hospital. This is concerning for CHF exacerbation. BNP is marginally elevated from baseline, and chest x-ray is consistent with fluid overload. Leg edema is worsening by report. Blood pressure is very soft., But it appears that it's at his baseline Therefore, aggressive diuresis, is not possible at this time. D-dimer is normal, age adjusted. Doubt PE. Patient will require admission for close supervision, diuresis, and will likely need pulmonary consultation as well.  Nursing Notes Reviewed/ Care Coordinated, and agree without changes. Applicable Imaging Reviewed.  Interpretation of Laboratory Data incorporated into ED treatment  Plan: Admit   Daleen Bo, MD 02/27/16 0020

## 2016-02-26 NOTE — ED Notes (Signed)
Pt complaining of increasing SOB and leg swelling. Discharged from [facility] this am post CHF hospital admission. Pt states inability to tolerate lying.

## 2016-02-26 NOTE — H&P (Signed)
History and Physical  Christopher Holder [id number] DOB: Sep 02, 1930 DOA: 02/26/2016  PCP:  Mackie Pai, PA-C   Chief Complaint:  Dyspnea   History of Present Illness:  Patient is a 82 yo male with history of HTN, systolic HF, pulmonary fibrosis, DMII, who came with cc of dyspnea. - symptoms started 2 weeks ago associated with orthopnea , PND and bilateral LE swelling. No chest pain. - Patient has cough productive occasionally of yellowish sputum without fever or chills. - No nausea/vomiting, abd pain, dysuria, palpitations, headache.  - he is complaint with his medications including diuretics. He gained 14 lbs since last admission. He was discharged from rehab today.   Review of Systems:  CONSTITUTIONAL:     No night sweats.  +fatigue.  No fever. No chills. Eyes:                            No visual changes.  No eye pain.  No eye discharge.   ENT:                              No epistaxis.  No sinus pain.  No sore throat.   No congestion. RESPIRATORY:           +cough.  +wheeze.  No hemoptysis.  +dyspnea CARDIOVASCULAR   :  No chest pains.  No palpitations. GASTROINTESTINAL:  No abdominal pain.  No nausea. No vomiting.  No diarrhea. No constipation.  No hematemesis.  No hematochezia.  No melena. GENITOURINARY:      No urgency.  No frequency.  No dysuria.  No hematuria.  No  obstructive symptoms.  No discharge.  No pain.   MUSCULOSKELETAL:  No musculoskeletal pain.  No joint swelling.  No arthritis. NEUROLOGICAL:        No confusion.  No weakness. No headache. No seizure. PSYCHIATRIC:             No depression. No anxiety. No suicidal ideation. SKIN:                             No rashes.  No lesions.  No wounds. ENDOCRINE:                No weight loss.  No polydipsia.  No polyuria.  No polyphagia. HEMATOLOGIC:           No purpura.  No petechiae.  No bleeding.  ALLERGIC                 : No pruritus.  No angioedema Other:  Past Medical and Surgical History:   Past  Medical History  Diagnosis Date  . Diabetes mellitus without complication ([place])   . CHF (congestive heart failure) ([place])   . Coronary artery disease   . COPD (chronic obstructive pulmonary disease) ([place])   . Asthma   . Hypertension   . HOH (hard of hearing)   . Sleep apnea   . Myocardial infarction ([place])   . Hyperlipidemia 05/27/2015  . GERD (gastroesophageal reflux disease) 05/27/2015  . AKI (acute kidney injury) ([place]) 08/18/2015  . Arthritis   . Pulmonary fibrosis [facility])    Past Surgical History  Procedure Laterality Date  . Coronary angioplasty with stent placement    . Knee surgery    . Total knee arthroplasty      Knee replacement x2  .  Cardioversion [name] 08/28/2015    Procedure: CARDIOVERSION;  Surgeon: Jerline Pain, MD;  Location: [place];  Service: Cardiovascular;  Laterality: [name];  . Cardiac catheterization [name] 10/02/2015    Procedure: Left Heart Cath and Coronary Angiography;  Surgeon: Leonie Man, MD;  Location: [place] [facility];  Service: Cardiovascular;  Laterality: [name];  . Joint replacement      Social History:   reports that he has never smoked. He has never used smokeless tobacco. He reports that he does not drink alcohol or use illicit drugs.    Allergies  Allergen Reactions  . Percocet [Oxycodone-Acetaminophen] Diarrhea, Nausea And Vomiting and Nausea Only  . Sulfa Antibiotics Diarrhea and Nausea And Vomiting    Family History  Problem Relation Age of Onset  . Cancer Mother   . Diabetes Father   . Heart attack Brother   . Heart attack Sister       Prior to Admission medications   Medication Sig Start Date End Date Taking? Authorizing Provider  allopurinol (ZYLOPRIM) 100 MG tablet Take 1 tablet by mouth two  times daily Patient taking differently: Take 100 mg by mouth 2 (two) times daily.  12/25/15  Yes Edward [name], PA-C  atorvastatin (LIPITOR) 10 MG tablet Take 10 mg by mouth at bedtime.    Yes Historical Provider, MD  carvedilol (COREG)  3.125 MG tablet 1 tab po bid Patient taking differently: Take 3.125 mg by mouth 2 (two) times daily.  01/19/16  Yes Edward [name], PA-C  fluticasone [facility]) 50 MCG/ACT nasal spray Use 2 sprays in each  nostril daily 11/13/15  Yes Midge Minium, MD  glipiZIDE (GLUCOTROL) 5 MG tablet Take 1 tablet (5 mg total) by mouth 2 (two) times daily before a meal. Reported on 12/29/2015 Patient taking differently: Take 5 mg by mouth daily before breakfast. Reported on 12/29/2015 01/19/16 01/18/17 Yes Edward [name], PA-C  losartan (COZAAR) 25 MG tablet Take 25 mg by mouth daily.   Yes Historical Provider, MD  montelukast (SINGULAIR) 10 MG tablet Take 1 tablet by mouth at  bedtime Patient taking differently: Take 1 tablet (10 mg) by mouth at  bedtime 11/10/15  Yes Edward [name], PA-C  NON FORMULARY CPAP at night (setting is at 15)   Yes Historical Provider, MD  omeprazole (PRILOSEC) 20 MG capsule Take 1 capsule by mouth  daily Patient taking differently: Take 20 mg by mouth every morning.  12/25/15  Yes Edward [name], PA-C  potassium chloride (K-DUR,KLOR-CON) 10 MEQ tablet Take 10 mEq by mouth daily. 02/22/16  Yes Historical Provider, MD  tiotropium (SPIRIVA) 18 MCG inhalation capsule Place 1 capsule (18 mcg total) into inhaler and inhale daily. 09/22/15  Yes Edward [name], PA-C  traZODone (DESYREL) 50 MG tablet 1/2 tab po q hs Patient taking differently: Take 25 mg by mouth at bedtime.  01/19/16  Yes Edward [name], PA-C  albuterol (PROVENTIL HFA;VENTOLIN HFA) 108 (90 BASE) MCG/ACT inhaler Inhale 2 puffs into the lungs every 4 (four) hours as needed for wheezing or shortness of breath.     Historical Provider, MD  Ruthe Mannan 200-5 MCG/ACT AERO Use 2 puffs two times daily 11/02/15   Percell Miller [name], PA-C  ELIQUIS 5 MG TABS tablet Take 1 tablet (5 mg total) by mouth 2 (two) times daily. 10/12/15   Percell Miller [name], PA-C  furosemide (LASIX) 40 MG tablet Take 2 tablets (80 mg total) by mouth 2 (two) times daily. 02/10/16   Amy D  Clegg, NP  metolazone (ZAROXOLYN) 2.5 MG  tablet Take 2.5 mg by mouth daily.    Historical Provider, MD    Physical Exam: BP 100/69 mmHg  Pulse 107  Temp(Src) 97.3 F (36.3 C) (Oral)  Resp 20  Ht 6' (1.829 m)  Wt 122.074 kg (269 lb 2 oz)  BMI 36.49 kg/m2  SpO2 97%  GENERAL :   Alert and cooperative, and appears to be in mild acute distress. HEAD:           normocephalic. EYES:            PERRL, EOMI.  vision is grossly intact. EARS:           hearing grossly intact. NOSE:           No nasal discharge. THROAT:     Oral cavity and pharynx normal.   NECK:          supple, non-tender. CARDIAC:    Normal S1 and S2. S3+ Vascular:     Significant peripheral edema.  Marland Kitchen LUNGS:       Bilateral crackles and wheezing.  ABDOMEN: Positive bowel sounds. Soft, nondistended, nontender. No guarding or rebound.      MSK:           No joint erythema or tenderness.  EXT           : No significant deformity or joint abnormality. Neuro        : Alert, oriented to person, place, and time.                      CN II-XII intact.  SKIN:            No rash. No lesions. PSYCH:       No hallucination. Patient is not suicidal.          Labs on Admission:  Reviewed.   Radiological Exams on Admission: Dg Chest 2 View  02/26/2016  CLINICAL DATA:  Shortness of breath and leg swelling EXAM: CHEST  2 VIEW COMPARISON:  Feb 02, 2016 FINDINGS: Cardiomegaly with edema and small effusions. More focal opacity seen in the lateral right lung, possibly developing infiltrate. Probable atelectasis in the left base. No other changes. IMPRESSION: Cardiomegaly and edema. Focal opacity in the right lateral lung. Recommend follow-up to resolution. Electronically Signed   By: Dorise Bullion III M.D   On: 02/26/2016 19:01    EKG:  Independently reviewed. Afib.   Assessment/Plan  Acute on chronic hear failure: Systolic Ddx: non compliance with diet         Arrhythmia : Afib/RVR will keep on tele         Valvular disease :  mod MR from Echo in May 2017         ACS: less likely, no CP. Will check serial trops.          Last Echo: May 2017 with EF 20% Keep on tele Diuretics: lasix 60 mg IV BID.  BB: continue with holding parameters ( stop for BP<100/HR<60)  ACEI/ARBS: continue with holding parameters  Monitor I/O & daily weights Fluid restriction Heart healthy diet  COPD: cont home  Meds. Patient may have a bronchitis due to productive cough.will start on doxycycline 100 mg BID. Keep on Combivent Q6H.   CKD: Cr at baseline.   DMII: placed on low dose correction.   Atrial fibrillation/flutter: Likely related to underlying hypertensive heart disease and/or CAD. EKG: afib/RVR ChADSVasc:  4 Anticoagulation: Eliquis  Rate/Rythm control: rate controled with  BB Rule out MI : serial trops and EKGs.                                                                      Input & Output:  Ordered  Lines & Tubes: PIV DVT prophylaxis:  On Eliquis GI prophylaxis: PPI Consultants: NA Code Status: Full  Family Communication: at bedside  Disposition Plan: Tele     Gennaro Africa M.D [facility]

## 2016-02-27 ENCOUNTER — Encounter (HOSPITAL_COMMUNITY): Payer: Self-pay

## 2016-02-27 ENCOUNTER — Observation Stay (HOSPITAL_COMMUNITY): Payer: Medicare Other

## 2016-02-27 DIAGNOSIS — I5022 Chronic systolic (congestive) heart failure: Secondary | ICD-10-CM | POA: Diagnosis not present

## 2016-02-27 DIAGNOSIS — I480 Paroxysmal atrial fibrillation: Secondary | ICD-10-CM | POA: Diagnosis present

## 2016-02-27 DIAGNOSIS — J8410 Pulmonary fibrosis, unspecified: Secondary | ICD-10-CM | POA: Diagnosis not present

## 2016-02-27 DIAGNOSIS — T501X5A Adverse effect of loop [high-ceiling] diuretics, initial encounter: Secondary | ICD-10-CM | POA: Diagnosis present

## 2016-02-27 DIAGNOSIS — E1122 Type 2 diabetes mellitus with diabetic chronic kidney disease: Secondary | ICD-10-CM | POA: Diagnosis present

## 2016-02-27 DIAGNOSIS — R0602 Shortness of breath: Secondary | ICD-10-CM | POA: Diagnosis present

## 2016-02-27 DIAGNOSIS — I5023 Acute on chronic systolic (congestive) heart failure: Secondary | ICD-10-CM | POA: Diagnosis not present

## 2016-02-27 DIAGNOSIS — J189 Pneumonia, unspecified organism: Secondary | ICD-10-CM | POA: Diagnosis present

## 2016-02-27 DIAGNOSIS — R0600 Dyspnea, unspecified: Secondary | ICD-10-CM | POA: Diagnosis not present

## 2016-02-27 DIAGNOSIS — J9601 Acute respiratory failure with hypoxia: Secondary | ICD-10-CM | POA: Diagnosis present

## 2016-02-27 DIAGNOSIS — H9190 Unspecified hearing loss, unspecified ear: Secondary | ICD-10-CM | POA: Diagnosis present

## 2016-02-27 DIAGNOSIS — J90 Pleural effusion, not elsewhere classified: Secondary | ICD-10-CM | POA: Diagnosis not present

## 2016-02-27 DIAGNOSIS — E1121 Type 2 diabetes mellitus with diabetic nephropathy: Secondary | ICD-10-CM | POA: Diagnosis present

## 2016-02-27 DIAGNOSIS — E876 Hypokalemia: Secondary | ICD-10-CM | POA: Diagnosis present

## 2016-02-27 DIAGNOSIS — I472 Ventricular tachycardia: Secondary | ICD-10-CM | POA: Diagnosis not present

## 2016-02-27 DIAGNOSIS — Z7901 Long term (current) use of anticoagulants: Secondary | ICD-10-CM | POA: Diagnosis not present

## 2016-02-27 DIAGNOSIS — J449 Chronic obstructive pulmonary disease, unspecified: Secondary | ICD-10-CM | POA: Insufficient documentation

## 2016-02-27 DIAGNOSIS — K219 Gastro-esophageal reflux disease without esophagitis: Secondary | ICD-10-CM | POA: Diagnosis present

## 2016-02-27 DIAGNOSIS — I130 Hypertensive heart and chronic kidney disease with heart failure and stage 1 through stage 4 chronic kidney disease, or unspecified chronic kidney disease: Secondary | ICD-10-CM | POA: Diagnosis present

## 2016-02-27 DIAGNOSIS — Z7189 Other specified counseling: Secondary | ICD-10-CM | POA: Diagnosis not present

## 2016-02-27 DIAGNOSIS — I252 Old myocardial infarction: Secondary | ICD-10-CM | POA: Diagnosis not present

## 2016-02-27 DIAGNOSIS — N183 Chronic kidney disease, stage 3 (moderate): Secondary | ICD-10-CM | POA: Diagnosis present

## 2016-02-27 DIAGNOSIS — E662 Morbid (severe) obesity with alveolar hypoventilation: Secondary | ICD-10-CM | POA: Diagnosis present

## 2016-02-27 DIAGNOSIS — J440 Chronic obstructive pulmonary disease with acute lower respiratory infection: Secondary | ICD-10-CM | POA: Diagnosis present

## 2016-02-27 DIAGNOSIS — Z6836 Body mass index (BMI) 36.0-36.9, adult: Secondary | ICD-10-CM | POA: Diagnosis not present

## 2016-02-27 DIAGNOSIS — Z96653 Presence of artificial knee joint, bilateral: Secondary | ICD-10-CM | POA: Diagnosis present

## 2016-02-27 DIAGNOSIS — J441 Chronic obstructive pulmonary disease with (acute) exacerbation: Secondary | ICD-10-CM | POA: Diagnosis present

## 2016-02-27 DIAGNOSIS — J438 Other emphysema: Secondary | ICD-10-CM | POA: Diagnosis not present

## 2016-02-27 DIAGNOSIS — I2510 Atherosclerotic heart disease of native coronary artery without angina pectoris: Secondary | ICD-10-CM | POA: Diagnosis present

## 2016-02-27 DIAGNOSIS — I4892 Unspecified atrial flutter: Secondary | ICD-10-CM | POA: Diagnosis present

## 2016-02-27 DIAGNOSIS — Z515 Encounter for palliative care: Secondary | ICD-10-CM | POA: Diagnosis not present

## 2016-02-27 DIAGNOSIS — K5900 Constipation, unspecified: Secondary | ICD-10-CM | POA: Diagnosis present

## 2016-02-27 DIAGNOSIS — E785 Hyperlipidemia, unspecified: Secondary | ICD-10-CM | POA: Diagnosis present

## 2016-02-27 DIAGNOSIS — I5040 Unspecified combined systolic (congestive) and diastolic (congestive) heart failure: Secondary | ICD-10-CM | POA: Diagnosis not present

## 2016-02-27 DIAGNOSIS — Z955 Presence of coronary angioplasty implant and graft: Secondary | ICD-10-CM | POA: Diagnosis not present

## 2016-02-27 LAB — COMPREHENSIVE METABOLIC PANEL
ALBUMIN: 3.1 g/dL — AB (ref 3.5–5.0)
ALT: 25 U/L (ref 17–63)
ANION GAP: 9 (ref 5–15)
AST: 24 U/L (ref 15–41)
Alkaline Phosphatase: 90 U/L (ref 38–126)
BILIRUBIN TOTAL: 0.9 mg/dL (ref 0.3–1.2)
BUN: 22 mg/dL — ABNORMAL HIGH (ref 6–20)
CHLORIDE: 100 mmol/L — AB (ref 101–111)
CO2: 30 mmol/L (ref 22–32)
Calcium: 8.8 mg/dL — ABNORMAL LOW (ref 8.9–10.3)
Creatinine, Ser: 1.33 mg/dL — ABNORMAL HIGH (ref 0.61–1.24)
GFR calc Af Amer: 55 mL/min — ABNORMAL LOW (ref >60)
GFR calc non Af Amer: 48 mL/min — ABNORMAL LOW (ref >60)
GLUCOSE: 133 mg/dL — AB (ref 65–99)
POTASSIUM: 3.3 mmol/L — AB (ref 3.5–5.1)
SODIUM: 139 mmol/L (ref 135–145)
TOTAL PROTEIN: 6 g/dL — AB (ref 6.5–8.1)

## 2016-02-27 LAB — GLUCOSE, CAPILLARY
GLUCOSE-CAPILLARY: 128 mg/dL — AB (ref 65–99)
GLUCOSE-CAPILLARY: 160 mg/dL — AB (ref 65–99)
Glucose-Capillary: 105 mg/dL — ABNORMAL HIGH (ref 65–99)
Glucose-Capillary: 150 mg/dL — ABNORMAL HIGH (ref 65–99)

## 2016-02-27 LAB — TROPONIN I
Troponin I: 0.04 ng/mL — ABNORMAL HIGH (ref <0.031)
Troponin I: 0.06 ng/mL — ABNORMAL HIGH (ref <0.031)
Troponin I: 0.03 ng/mL (ref <0.031)

## 2016-02-27 MED ORDER — METHYLPREDNISOLONE SODIUM SUCC 40 MG IJ SOLR
40.0000 mg | Freq: Two times a day (BID) | INTRAMUSCULAR | Status: DC
  Administered 2016-02-27 – 2016-02-29 (×4): 40 mg via INTRAVENOUS
  Filled 2016-02-27 (×5): qty 1

## 2016-02-27 MED ORDER — LORAZEPAM 2 MG/ML IJ SOLN
1.0000 mg | Freq: Once | INTRAMUSCULAR | Status: AC
  Administered 2016-02-27: 1 mg via INTRAVENOUS
  Filled 2016-02-27: qty 1

## 2016-02-27 MED ORDER — IPRATROPIUM-ALBUTEROL 0.5-2.5 (3) MG/3ML IN SOLN
3.0000 mL | Freq: Three times a day (TID) | RESPIRATORY_TRACT | Status: DC
  Administered 2016-02-28 – 2016-03-01 (×9): 3 mL via RESPIRATORY_TRACT
  Filled 2016-02-27 (×10): qty 3

## 2016-02-27 MED ORDER — LEVALBUTEROL HCL 1.25 MG/0.5ML IN NEBU: 1.2500 mg | INHALATION_SOLUTION | Freq: Four times a day (QID) | RESPIRATORY_TRACT | Status: DC | PRN

## 2016-02-27 NOTE — Progress Notes (Signed)
Patient was assisted in setting up his CPAP machine from home.  Nasal mask, humidifier filled.  Patient is familiar with equipment and procedure and will self-administer when ready for sleep.  Patient was instructed to contact RN or RT with questions or concerns or if he needs further assistance.

## 2016-02-27 NOTE — Discharge Instructions (Signed)

## 2016-02-27 NOTE — Progress Notes (Signed)
Patient ID: Christopher Holder, male   DOB: [birth year], 82 y.o.   MRN: [id number]    PROGRESS NOTE    Sarthak Tonn  [id number] DOB: 1929-11-03 DOA: 02/26/2016  PCP: Mackie Pai, PA-C   Brief Narrative:  Patient is a 82 yo male with known chronic combined CHF and pulmonary fibrosis, EF 20-25%, presented to [facility] ED with main concern of progressive dyspnea with exertion and at rest.   Assessment & Plan:  Acute hypoxic respiratory failure due to acute on chronic combined CHF and pulmonary fibrosis, AECOPD - 2D echo EF 20-25% in the past month so no need to repeat new ECHO - still quite symptomatic this morning at rest and with minimal exertion  - will consult heart failure team - continue Lasix 60 mg IV BID - weight 120 kg this AM - monitor daily weights, strict I/O - considering recurrent hospitalizations for same condition, progressive heart failure, consideration to PCT consultation for further goals of care discussions   AECOPD, pulmonary fibrosis - wheezing with expiration noted - placed on solumedrol today with tapering - allow BD scheduled and as needed   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  - hold home glipizide, SSI while inpatient   CKD stage III  - review of records indicate GFR in 40's with baseline Cr ~ 1.4 - BMP in AM  PAF, CHADS2 score 5 - continue Coreg, Eliquis - rate control for now  - keep on telemetry   Hypokalemia due to lasix use - mild, supplement and repeat BMP in AM  HTN, essential  - continue home medications   CAD - stable no chest pain, continue current regimen - trop elevated, flat   Morbid obesity due to excess calories - pt meets criteria with BMI > 35 and underlying risks of HTN, DM - Body mass index is 36.01 kg/(m^2)  DVT prophylaxis: Eliquis Code Status: Full Family Communication: Patient at bedside  Disposition Plan: SNF next week   Consultants:   None  Procedures:   None  Antimicrobials:    None   Subjective: Reports feeling better but still with dyspnea.   Objective: Filed Vitals:   02/26/16 2100 02/27/16 0012 02/27/16 0900 02/27/16 1200  BP: 100/69 110/65 99/64 100/72  Pulse: 107 97 135 88  Temp:  98 F (36.7 C)  97.8 F (36.6 C)  TempSrc:  Oral  Oral  Resp: 20 20  18   Height:  6' (1.829 m)    Weight:  120.475 kg (265 lb 9.6 oz)    SpO2: 97% 97%  93%    Intake/Output Summary (Last 24 hours) at 02/27/16 1546 Last data filed at 02/27/16 1346  Gross per 24 hour  Intake    480 ml  Output   1625 ml  Net  -1145 ml   Filed Weights   02/26/16 1657 02/27/16 0012  Weight: 122.074 kg (269 lb 2 oz) 120.475 kg (265 lb 9.6 oz)    Examination:  General exam: Appears calm and comfortable if sitting in chair, dyspnea with exertion  Respiratory system: Crackles at bases with exp wheezing in bilateral lobes upper and lower, course breath sounds  Cardiovascular system: S1 & S2 heard, IRRR. No rubs, gallops or clicks. No pedal edema. Gastrointestinal system: Abdomen is nondistended, soft and nontender.  Central nervous system: Alert and oriented. No focal neurological deficits. Extremities: Symmetric 5 x 5 power. + 2 bilateral LE pitting edema    Data Reviewed: I have personally reviewed following labs and imaging studies  CBC:  Recent Labs Lab 02/26/16 1654  WBC 7.4  HGB 12.0*  HCT 37.8*  MCV 87.5  PLT 99991111   Basic Metabolic Panel:  Recent Labs Lab 02/26/16 1654 02/27/16 0227  NA 135 139  K 4.1 3.3*  CL 100* 100*  CO2 26 30  GLUCOSE 128* 133*  BUN 22* 22*  CREATININE 1.38* 1.33*  CALCIUM 9.1 8.8*   GFR: Estimated Creatinine Clearance: 56.4 mL/min (by C-G formula based on Cr of 1.33). Liver Function Tests:  Recent Labs Lab 02/27/16 0227  AST 24  ALT 25  ALKPHOS 90  BILITOT 0.9  PROT 6.0*  ALBUMIN 3.1*   Cardiac Enzymes:  Recent Labs Lab 02/26/16 2231 02/27/16 0227  TROPONINI 0.03 0.04*   BNP (last 3 results)  Recent Labs   10/06/15 1153 10/21/15 1106 01/05/16 1148  PROBNP 474.0* 318.0* 538.0*   CBG:  Recent Labs Lab 02/26/16 2240 02/27/16 0536 02/27/16 1131  GLUCAP 108* 105* 150*   Urine analysis:    Component Value Date/Time   COLORURINE YELLOW 12/16/2015 0011   APPEARANCEUR CLEAR 12/16/2015 0011   LABSPEC 1.013 12/16/2015 0011   PHURINE 6.0 12/16/2015 0011   GLUCOSEU NEGATIVE 12/16/2015 0011   HGBUR NEGATIVE 12/16/2015 0011   BILIRUBINUR NEGATIVE 12/16/2015 0011   KETONESUR NEGATIVE 12/16/2015 0011   PROTEINUR NEGATIVE 12/16/2015 0011   NITRITE NEGATIVE 12/16/2015 0011   LEUKOCYTESUR NEGATIVE 12/16/2015 0011     Radiology Studies: Dg Chest 2 View  02/26/2016  CLINICAL DATA:  Shortness of breath and leg swelling EXAM: CHEST  2 VIEW COMPARISON:  Feb 02, 2016 FINDINGS: Cardiomegaly with edema and small effusions. More focal opacity seen in the lateral right lung, possibly developing infiltrate. Probable atelectasis in the left base. No other changes. IMPRESSION: Cardiomegaly and edema. Focal opacity in the right lateral lung. Recommend follow-up to resolution. Electronically Signed   By: Dorise Bullion III M.D   On: 02/26/2016 19:01      Scheduled Meds: . allopurinol  100 mg Oral BID  . apixaban  5 mg Oral BID  . aspirin EC  81 mg Oral Daily  . atorvastatin  10 mg Oral QHS  . carvedilol  3.125 mg Oral BID WC  . doxycycline  100 mg Oral Q12H  . fluticasone  2 spray Each Nare Daily  . furosemide  60 mg Intravenous BID  . insulin aspart  0-9 Units Subcutaneous TID WC  . ipratropium-albuterol  3 mL Nebulization Q6H  . losartan  25 mg Oral Daily  . methylPREDNISolone (SOLU-MEDROL) injection  40 mg Intravenous Q12H  . mometasone-formoterol  2 puff Inhalation BID  . montelukast  10 mg Oral QHS  . pantoprazole  40 mg Oral Daily  . potassium chloride  40 mEq Oral Daily  . sodium chloride flush  3 mL Intravenous Q12H  . tiotropium  18 mcg Inhalation Daily  . traZODone  25 mg Oral QHS    Continuous Infusions:  Time spent: 20 minutes   Faye Ramsay, MD [facility] Pager [phone]  If 7PM-7AM, please contact night-coverage [email or web address] Password [facility] 02/27/2016, 3:46 PM

## 2016-02-27 NOTE — Progress Notes (Signed)
Pt complained of shortness of breath and was very anxious and cursing. O2 sats were 96% on room air. Robitussin, and Spiriva inhaler given and pt moved to recliner so he could sit up in the chair. Dr Doyle Askew notified and order received for one time Ativan 1 mg. Pt more relaxed after sitting in chair and using inhaler.

## 2016-02-28 DIAGNOSIS — Z7189 Other specified counseling: Secondary | ICD-10-CM | POA: Insufficient documentation

## 2016-02-28 DIAGNOSIS — J8410 Pulmonary fibrosis, unspecified: Secondary | ICD-10-CM | POA: Insufficient documentation

## 2016-02-28 DIAGNOSIS — R0600 Dyspnea, unspecified: Secondary | ICD-10-CM | POA: Insufficient documentation

## 2016-02-28 DIAGNOSIS — Z515 Encounter for palliative care: Secondary | ICD-10-CM

## 2016-02-28 LAB — BASIC METABOLIC PANEL
ANION GAP: 8 (ref 5–15)
BUN: 29 mg/dL — ABNORMAL HIGH (ref 6–20)
CO2: 30 mmol/L (ref 22–32)
CREATININE: 1.54 mg/dL — AB (ref 0.61–1.24)
Calcium: 9 mg/dL (ref 8.9–10.3)
Chloride: 100 mmol/L — ABNORMAL LOW (ref 101–111)
GFR calc non Af Amer: 40 mL/min — ABNORMAL LOW (ref >60)
GFR, EST AFRICAN AMERICAN: 46 mL/min — AB (ref >60)
GLUCOSE: 172 mg/dL — AB (ref 65–99)
Potassium: 4 mmol/L (ref 3.5–5.1)
Sodium: 138 mmol/L (ref 135–145)

## 2016-02-28 LAB — GLUCOSE, CAPILLARY
GLUCOSE-CAPILLARY: 174 mg/dL — AB (ref 65–99)
GLUCOSE-CAPILLARY: 271 mg/dL — AB (ref 65–99)
GLUCOSE-CAPILLARY: 227 mg/dL — AB (ref 65–99)
Glucose-Capillary: 178 mg/dL — ABNORMAL HIGH (ref 65–99)

## 2016-02-28 LAB — CBC
HEMATOCRIT: 37.1 % — AB (ref 39.0–52.0)
HEMOGLOBIN: 11.5 g/dL — AB (ref 13.0–17.0)
MCH: 27.6 pg (ref 26.0–34.0)
MCHC: 31 g/dL (ref 30.0–36.0)
MCV: 89 fL (ref 78.0–100.0)
Platelets: 191 10*3/uL (ref 150–400)
RBC: 4.17 MIL/uL — AB (ref 4.22–5.81)
RDW: 18.2 % — ABNORMAL HIGH (ref 11.5–15.5)
WBC: 5.6 10*3/uL (ref 4.0–10.5)

## 2016-02-28 LAB — TROPONIN I: TROPONIN I: 0.03 ng/mL (ref <0.031)

## 2016-02-28 LAB — STREP PNEUMONIAE URINARY ANTIGEN: STREP PNEUMO URINARY ANTIGEN: POSITIVE — AB

## 2016-02-28 MED ORDER — GUAIFENESIN ER 600 MG PO TB12
600.0000 mg | ORAL_TABLET | Freq: Two times a day (BID) | ORAL | Status: DC
  Administered 2016-02-28 – 2016-03-04 (×11): 600 mg via ORAL
  Filled 2016-02-28 (×11): qty 1

## 2016-02-28 MED ORDER — APIXABAN 2.5 MG PO TABS
2.5000 mg | ORAL_TABLET | Freq: Two times a day (BID) | ORAL | Status: DC
  Administered 2016-02-28 – 2016-03-03 (×8): 2.5 mg via ORAL
  Filled 2016-02-28 (×8): qty 1

## 2016-02-28 MED ORDER — INSULIN ASPART 100 UNIT/ML ~~LOC~~ SOLN
0.0000 [IU] | Freq: Three times a day (TID) | SUBCUTANEOUS | Status: DC
  Administered 2016-02-28: 3 [IU] via SUBCUTANEOUS
  Administered 2016-02-28: 2 [IU] via SUBCUTANEOUS
  Administered 2016-02-28: 5 [IU] via SUBCUTANEOUS
  Administered 2016-02-29 (×2): 2 [IU] via SUBCUTANEOUS
  Administered 2016-02-29: 3 [IU] via SUBCUTANEOUS
  Administered 2016-03-01: 1 [IU] via SUBCUTANEOUS
  Administered 2016-03-01 – 2016-03-02 (×3): 2 [IU] via SUBCUTANEOUS
  Administered 2016-03-02: 1 [IU] via SUBCUTANEOUS
  Administered 2016-03-02: 3 [IU] via SUBCUTANEOUS
  Administered 2016-03-03: 2 [IU] via SUBCUTANEOUS
  Administered 2016-03-03: 5 [IU] via SUBCUTANEOUS
  Administered 2016-03-03: 3 [IU] via SUBCUTANEOUS
  Administered 2016-03-04: 1 [IU] via SUBCUTANEOUS

## 2016-02-28 MED ORDER — CARVEDILOL 3.125 MG PO TABS
3.1250 mg | ORAL_TABLET | Freq: Two times a day (BID) | ORAL | Status: DC
Start: 2016-02-28 — End: 2016-03-04
  Administered 2016-02-28 – 2016-03-04 (×11): 3.125 mg via ORAL
  Filled 2016-02-28 (×11): qty 1

## 2016-02-28 MED ORDER — LEVALBUTEROL HCL 1.25 MG/0.5ML IN NEBU: 1.2500 mg | INHALATION_SOLUTION | Freq: Four times a day (QID) | RESPIRATORY_TRACT | Status: DC

## 2016-02-28 NOTE — Consult Note (Signed)
Consultation Note Date: 02/28/2016   Patient Name: Christopher Holder  DOB: [birth year]  MRN: [id number]  Age / Sex: 82 y.o., male  PCP: Mackie Pai, PA-C Referring Physician: Theodis Blaze, MD  Reason for Consultation: Establishing goals of care  HPI/Patient Profile: 82 y.o. male   admitted on 02/26/2016 with PMH of HTN, systolic HF, pulmonary fibrosis, DMII, admitted with cc of dyspnea.  Continued physical and functional decline, multiple hospitatilizations.  Faced with advanced directive decisions and anticipatory care needs   Clinical Assessment and Goals of Care:   This NP Wadie Lessen reviewed medical records, received report from team, assessed the patient and then meet at the patient's bedside along with his daughter  to discuss diagnosis, prognosis, GOC, EOL wishes disposition and options.   A detailed discussion was had today regarding advanced directives.  Concepts specific to code status, artifical feeding and hydration, continued IV antibiotics and rehospitalization was had.  The difference between a aggressive medical intervention path  and a palliative comfort care path for this patient at this time was had.  Values and goals of care important to patient and family were attempted to be elicited.  Concept of Hospice and Palliative Care were discussed  Natural trajectory and expectations at EOL were discussed.  Questions and concerns addressed.  Hard Choices booklet left for review. Family encouraged to call with questions or concerns.  PMT will continue to support holistically.  MOST formed introduced  HCPOA documented as daughter/ [name]      Code Status/Advance Care Planning:  Full code- encouraged to consider DNR/DNI status knowing poor outcomes in similar pateitns   Palliative Prophylaxis:   Bowel Regimen, Delirium Protocol, Frequent Pain Assessment and Oral  Care   Psycho-social/Spiritual:   Desire for further Chaplaincy support:yes  Additional Recommendations: Education on Hospice  Prognosis:   Less than 6 months  Discharge Planning:    Daughter tells me a bed was secured at [facility] prior to discharge from [employer] [facility] last week and will be ready on Wednesday.   AL/ [name] with hospice services Order placed for [facility] to f/u in the morning on discharge plan     Primary Diagnoses: Present on Admission:  **None**  I have reviewed the medical record, interviewed the patient and family, and examined the patient. The following aspects are pertinent.  Past Medical History  Diagnosis Date  . Diabetes mellitus without complication ([place])   . CHF (congestive heart failure) ([place])   . Coronary artery disease   . COPD (chronic obstructive pulmonary disease) ([place])   . Asthma   . Hypertension   . HOH (hard of hearing)   . Sleep apnea   . Myocardial infarction ([place])   . Hyperlipidemia 05/27/2015  . GERD (gastroesophageal reflux disease) 05/27/2015  . AKI (acute kidney injury) ([place]) 08/18/2015  . Arthritis   . Pulmonary fibrosis [facility])    Social History   Social History  . Marital Status: Widowed    Spouse Name: [name]  . Number of Children: [name]  .  Years of Education: [name]   Occupational History  . retired    Social History Main Topics  . Smoking status: Never Smoker   . Smokeless tobacco: Never Used  . Alcohol Use: No  . Drug Use: No  . Sexual Activity: Not Asked   Other Topics Concern  . None   Social History Narrative   Family History  Problem Relation Age of Onset  . Cancer Mother   . Diabetes Father   . Heart attack Brother   . Heart attack Sister    Scheduled Meds: . allopurinol  100 mg Oral BID  . apixaban  5 mg Oral BID  . aspirin EC  81 mg Oral Daily  . atorvastatin  10 mg Oral QHS  . carvedilol  3.125 mg Oral BID WC  . doxycycline  100 mg Oral Q12H  . fluticasone  2 spray Each Nare Daily   . furosemide  60 mg Intravenous BID  . insulin aspart  0-9 Units Subcutaneous TID WC  . ipratropium-albuterol  3 mL Nebulization TID  . losartan  25 mg Oral Daily  . methylPREDNISolone (SOLU-MEDROL) injection  40 mg Intravenous Q12H  . mometasone-formoterol  2 puff Inhalation BID  . montelukast  10 mg Oral QHS  . pantoprazole  40 mg Oral Daily  . potassium chloride  40 mEq Oral Daily  . sodium chloride flush  3 mL Intravenous Q12H  . tiotropium  18 mcg Inhalation Daily  . traZODone  25 mg Oral QHS   Continuous Infusions:  PRN Meds:.sodium chloride, guaiFENesin, levalbuterol, sodium chloride flush Medications Prior to Admission:  Prior to Admission medications   Medication Sig Start Date End Date Taking? Authorizing Provider  albuterol (PROVENTIL HFA;VENTOLIN HFA) 108 (90 BASE) MCG/ACT inhaler Inhale 2 puffs into the lungs every 8 (eight) hours.    Yes Historical Provider, MD  allopurinol (ZYLOPRIM) 100 MG tablet Take 1 tablet by mouth two  times daily Patient taking differently: Take 100 mg by mouth 2 (two) times daily.  12/25/15  Yes Edward [name], PA-C  atorvastatin (LIPITOR) 10 MG tablet Take 10 mg by mouth at bedtime.    Yes Historical Provider, MD  carvedilol (COREG) 3.125 MG tablet 1 tab po bid Patient taking differently: Take 3.125 mg by mouth 2 (two) times daily.  01/19/16  Yes Edward [name], PA-C  DULERA 200-5 MCG/ACT AERO Use 2 puffs two times daily Patient taking differently: Use 2 puffs every 8 hours 11/02/15  Yes Edward [name], PA-C  ELIQUIS 5 MG TABS tablet Take 1 tablet (5 mg total) by mouth 2 (two) times daily. 10/12/15  Yes Edward [name], PA-C  fluticasone [facility]) 50 MCG/ACT nasal spray Use 2 sprays in each  nostril daily 11/13/15  Yes Midge Minium, MD  furosemide (LASIX) 40 MG tablet Take 2 tablets (80 mg total) by mouth 2 (two) times daily. 02/10/16  Yes Amy D Clegg, NP  glipiZIDE (GLUCOTROL) 5 MG tablet Take 1 tablet (5 mg total) by mouth 2 (two) times daily  before a meal. Reported on 12/29/2015 Patient taking differently: Take 5 mg by mouth daily before breakfast. Reported on 12/29/2015 01/19/16 01/18/17 Yes Edward [name], PA-C  guaiFENesin (ROBITUSSIN) 100 MG/5ML liquid Take 200 mg by mouth every 4 (four) hours as needed for cough.   Yes Historical Provider, MD  losartan (COZAAR) 25 MG tablet Take 25 mg by mouth daily.   Yes Historical Provider, MD  montelukast (SINGULAIR) 10 MG tablet Take 1 tablet by mouth at  bedtime  Patient taking differently: Take 1 tablet (10 mg) by mouth at  bedtime 11/10/15  Yes Edward [name], PA-C  NON FORMULARY CPAP at night (setting is at 15)   Yes Historical Provider, MD  omeprazole (PRILOSEC) 20 MG capsule Take 1 capsule by mouth  daily Patient taking differently: Take 20 mg by mouth every morning.  12/25/15  Yes Edward [name], PA-C  potassium chloride (K-DUR,KLOR-CON) 10 MEQ tablet Take 40 mEq by mouth daily.  02/22/16  Yes Historical Provider, MD  tiotropium (SPIRIVA) 18 MCG inhalation capsule Place 1 capsule (18 mcg total) into inhaler and inhale daily. 09/22/15  Yes Edward [name], PA-C  traZODone (DESYREL) 50 MG tablet 1/2 tab po q hs Patient taking differently: Take 25 mg by mouth at bedtime.  01/19/16  Yes Mackie Pai, PA-C   Allergies  Allergen Reactions  . Percocet [Oxycodone-Acetaminophen] Diarrhea, Nausea And Vomiting and Nausea Only  . Sulfa Antibiotics Diarrhea and Nausea And Vomiting   Review of Systems  Constitutional: Positive for fatigue.  Respiratory: Positive for shortness of breath and wheezing.     Physical Exam  Constitutional: He appears well-developed and well-nourished. He appears ill.  Cardiovascular: Tachycardia present.   Pulmonary/Chest: He has decreased breath sounds.  Musculoskeletal:  BLE + 2 edema  Neurological: He is alert.  Skin: Skin is warm and dry.    Vital Signs: BP 100/60 mmHg  Pulse 100  Temp(Src) 97.8 F (36.6 C) (Oral)  Resp 20  Ht 6' (1.829 m)  Wt 120.203 kg (265  lb)  BMI 35.93 kg/m2  SpO2 97% Pain Assessment: No/denies pain   Pain Score: 0-No pain   SpO2: SpO2: 97 % O2 Device:SpO2: 97 % O2 Flow Rate: .O2 Flow Rate (L/min): 3 L/min  IO: Intake/output summary:  Intake/Output Summary (Last 24 hours) at 02/28/16 0848 Last data filed at 02/28/16 0600  Gross per 24 hour  Intake    800 ml  Output   1800 ml  Net  -1000 ml    LBM: Last BM Date: 02/27/16 Baseline Weight: Weight: 122.074 kg (269 lb 2 oz) Most recent weight: Weight: 120.203 kg (265 lb) (a scale)      Palliative Assessment/Data:  40 % at best    Discussed with Dr Doyle Askew  Time In: 0900 Time Out: 1030 Time Total: 90 min Greater than 50%  of this time was spent counseling and coordinating care related to the above assessment and plan.  Signed by: Wadie Lessen, NP   Please contact Palliative Medicine Team phone at [phone] for questions and concerns.  For individual provider: See Shea Evans

## 2016-02-28 NOTE — Progress Notes (Signed)
Patient ID: Christopher Holder, male   DOB: 1929-10-26, 82 y.o.   MRN: [id number]    PROGRESS NOTE    Jatavius Manchego  [id number] DOB: 25-Mar-1930 DOA: 02/26/2016  PCP: Mackie Pai, PA-C   Brief Narrative:  Patient is a 82 yo male with known chronic combined CHF and pulmonary fibrosis, EF 20-25%, presented to [facility] ED with main concern of progressive dyspnea with exertion and at rest.   Assessment & Plan:  Acute hypoxic respiratory failure due to acute on chronic combined CHF, pulmonary fibrosis, AECOPD, acute LLL and RUL CAP, unknown pathogen  - 2D echo EF 20-25% in the past month so no need to repeat new ECHO - pt reports feeling better this AM but still with exertional dyspnea - continue Lasix 60 mg IV BID - weight 120 kg this AM and unchanged in the past 24 hours  - monitor daily weights, strict I/O - considering recurrent hospitalizations for same condition, progressive heart failure, consideration to PCT consultation for further goals of care discussions  - will consult with heart failure team, but suspect pt is not candidate for further cardiac interventions, medical management for now  LLL and RUL PNA, CAP - continue with doxycycline, BD as needed - follow up on strep pneumo, sputum cultures   AECOPD, pulmonary fibrosis - still with coarse breath sounds bilaterally  - continue solumedrol for now until less wheezing  - allow BD scheduled and as needed   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  - hold home glipizide, SSI while inpatient   CKD stage III  - review of records indicate GFR in 40's with baseline Cr ~ 1.4 - Cr up this AM from 1.3 --> 1.54, likely from lasix  - BMP in AM  PAF, CHADS2 score 5 - continue Coreg, Eliquis - rate control for now  - keep on telemetry   Hypokalemia due to lasix use - supplemented and WNL this AM   HTN, essential  - continue home medications   CAD - stable no chest pain, continue current regimen - trop elevated, flat    Morbid obesity due to excess calories - pt meets criteria with BMI > 35 and underlying risks of HTN, DM - Body mass index is 36.01 kg/(m^2)  DVT prophylaxis: Eliquis Code Status: Full Family Communication: Patient at bedside  Disposition Plan: SNF next week   Consultants:   None  Procedures:   None  Antimicrobials:   Doxycycline    Subjective: Reports feeling better but still with dyspnea mostly with exertion.   Objective: Filed Vitals:   02/27/16 2300 02/28/16 0539 02/28/16 0838 02/28/16 1012  BP:  91/56 100/60   Pulse: 84 122 100   Temp:      TempSrc:      Resp: 18 20    Height:      Weight:  120.203 kg (265 lb)    SpO2:  97%  98%    Intake/Output Summary (Last 24 hours) at 02/28/16 1355 Last data filed at 02/28/16 0600  Gross per 24 hour  Intake    320 ml  Output   1000 ml  Net   -680 ml   Filed Weights   02/26/16 1657 02/27/16 0012 02/28/16 0539  Weight: 122.074 kg (269 lb 2 oz) 120.475 kg (265 lb 9.6 oz) 120.203 kg (265 lb)    Examination:  General exam: Appears calm and comfortable if sitting in chair, dyspnea with standing and even few steps walking  Respiratory system: Crackles at  bases with exp wheezing in bilateral lobes upper and lower, course breath sounds still noted  Cardiovascular system: S1 & S2 heard, IRRR. No rubs, gallops or clicks. No pedal edema. Gastrointestinal system: Abdomen is nondistended, soft and nontender.  Central nervous system: Alert and oriented. No focal neurological deficits. Extremities: Symmetric 5 x 5 power. + 2 bilateral LE pitting edema    Data Reviewed: I have personally reviewed following labs and imaging studies  CBC:  Recent Labs Lab 02/26/16 1654 02/28/16 0358  WBC 7.4 5.6  HGB 12.0* 11.5*  HCT 37.8* 37.1*  MCV 87.5 89.0  PLT 191 99991111   Basic Metabolic Panel:  Recent Labs Lab 02/26/16 1654 02/27/16 0227 02/28/16 0358  NA 135 139 138  K 4.1 3.3* 4.0  CL 100* 100* 100*  CO2 26 30 30    GLUCOSE 128* 133* 172*  BUN 22* 22* 29*  CREATININE 1.38* 1.33* 1.54*  CALCIUM 9.1 8.8* 9.0   Liver Function Tests:  Recent Labs Lab 02/27/16 0227  AST 24  ALT 25  ALKPHOS 90  BILITOT 0.9  PROT 6.0*  ALBUMIN 3.1*   Cardiac Enzymes:  Recent Labs Lab 02/26/16 2231 02/27/16 0227 02/27/16 1553 02/27/16 2126 02/28/16 0358  TROPONINI 0.03 0.04* 0.06* 0.03 0.03   BNP (last 3 results)  Recent Labs  10/06/15 1153 10/21/15 1106 01/05/16 1148  PROBNP 474.0* 318.0* 538.0*   CBG:  Recent Labs Lab 02/27/16 1131 02/27/16 1557 02/27/16 2111 02/28/16 0534 02/28/16 1118  GLUCAP 150* 128* 160* 174* 271*   Urine analysis:    Component Value Date/Time   COLORURINE YELLOW 12/16/2015 0011   APPEARANCEUR CLEAR 12/16/2015 0011   LABSPEC 1.013 12/16/2015 0011   PHURINE 6.0 12/16/2015 0011   GLUCOSEU NEGATIVE 12/16/2015 0011   HGBUR NEGATIVE 12/16/2015 0011   BILIRUBINUR NEGATIVE 12/16/2015 0011   KETONESUR NEGATIVE 12/16/2015 0011   PROTEINUR NEGATIVE 12/16/2015 0011   NITRITE NEGATIVE 12/16/2015 0011   LEUKOCYTESUR NEGATIVE 12/16/2015 0011     Radiology Studies: Dg Chest 2 View 02/26/2016  Cardiomegaly and edema. Focal opacity in the right lateral lung.   Ct Chest Wo Contrast 02/27/2016   Again noted chronic interstitial coarsening bilaterally. Peripheral reticulation and architectural distortion consistent with fibrotic changes again noted. Findings consistent with pulmonary fibrosis. There is patchy superimposed peripheral ground-glass attenuation in right upper lobe. There is patchy airspace disease in left lower lobe. Findings highly suspicious for superimposed infiltrate/ pneumonia. Follow-up to resolution after treatment is recommended. 2. Stable mediastinal adenopathy. 3. Small dependent secretions are noted in mid trachea without evidence of airway obstruction. 4. There is small to moderate right pleural effusion. Small left pleural effusion. 5. Degenerative  changes thoracolumbar spine.   Scheduled Meds: . allopurinol  100 mg Oral BID  . apixaban  5 mg Oral BID  . aspirin EC  81 mg Oral Daily  . atorvastatin  10 mg Oral QHS  . carvedilol  3.125 mg Oral BID WC  . doxycycline  100 mg Oral Q12H  . fluticasone  2 spray Each Nare Daily  . furosemide  60 mg Intravenous BID  . insulin aspart  0-9 Units Subcutaneous TID WC  . ipratropium-albuterol  3 mL Nebulization TID  . losartan  25 mg Oral Daily  . methylPREDNISolone (SOLU-MEDROL) injection  40 mg Intravenous Q12H  . mometasone-formoterol  2 puff Inhalation BID  . montelukast  10 mg Oral QHS  . pantoprazole  40 mg Oral Daily  . potassium chloride  40 mEq Oral Daily  .  sodium chloride flush  3 mL Intravenous Q12H  . tiotropium  18 mcg Inhalation Daily  . traZODone  25 mg Oral QHS   Continuous Infusions:  Time spent: 20 minutes   Faye Ramsay, MD [facility] Pager [phone]  If 7PM-7AM, please contact night-coverage [email or web address] Password [email or web address] 02/28/2016, 1:55 PM

## 2016-02-28 NOTE — Evaluation (Signed)
Physical Therapy Evaluation Patient Details Name: Christopher Holder MRN: [id number] DOB: [birth year] Today's Date: 02/28/2016   History of Present Illness  Patient is a 82 yo male with known chronic combined CHF and pulmonary fibrosis, EF 20-25%, presented to [facility] ED with main concern of progressive dyspnea with exertion and at rest. Dx: acute hyposic respiratory failure due to acute on chronic combined  CHF, pulmonary fibrosis, AECOPD, acute LLL and RUL CAP, unknown pathogen.    Clinical Impression  Pt admitted with above diagnosis. Pt currently with functional limitations due to the deficits listed below (see PT Problem List). Mr. Swindall presents w/ poor functional endurance.  HR fluctuating 119-138 w/ short distance ambulation in room.  He requires min guard assist for safe mobility and will need 24/7 assist/supervision at d/c.  Therefore, recommending SNF at d/c. Pt will benefit from skilled PT to increase their independence and safety with mobility to allow discharge to the venue listed below.      Follow Up Recommendations SNF;Supervision/Assistance - 24 hour    Equipment Recommendations  None recommended by PT    Recommendations for Other Services       Precautions / Restrictions Precautions Precautions: Fall Restrictions Weight Bearing Restrictions: No      Mobility  Bed Mobility               General bed mobility comments: Pt sitting in recliner chair upon PT arrival  Transfers Overall transfer level: Needs assistance Equipment used: Rolling walker (2 wheeled) Transfers: Sit to/from Stand Sit to Stand: Min guard         General transfer comment: Min guard for safety as min instabilty noted.  Cues for hand placement to sit.  Ambulation/Gait Ambulation/Gait assistance: Min guard Ambulation Distance (Feet): 40 Feet Assistive device: Rolling walker (2 wheeled) Gait Pattern/deviations: Step-through pattern;Decreased stride length;Trunk flexed   Gait velocity  interpretation: Below normal speed for age/gender General Gait Details: HR fluctuating 119-138.  Flexed trunk which improves slightly w/ cues.  Pt fatigues quickly.  Stairs            Wheelchair Mobility    Modified Rankin (Stroke Patients Only)       Balance Overall balance assessment: Needs assistance Sitting-balance support: Feet supported;No upper extremity supported Sitting balance-Leahy Scale: Fair     Standing balance support: During functional activity;Bilateral upper extremity supported Standing balance-Leahy Scale: Poor Standing balance comment: Relies on UE support                             Pertinent Vitals/Pain Pain Assessment: No/denies pain    Home Living Family/patient expects to be discharged to:: Private residence Living Arrangements: Alone Available Help at Discharge: Family;Available PRN/intermittently (Grandaughter works during the day) Type of Home: Apartment Home Access: Stairs to enter Entrance Stairs-Rails: Left;Right;Can reach both Technical brewer of Steps: 5 Home Layout: One level Home Equipment: Environmental consultant - 2 wheels;Cane - single point;Tub bench;Hand held shower head Additional Comments: Planning to move to Assisted Living on 03/02/16 [facility] per pt)    Prior Function Level of Independence: Needs assistance   Gait / Transfers Assistance Needed: Has been using his cane in his home, if going outside he uses his RW.  Has been limited to distances of ~15 ft before needing to sit down and rest or lean against the wall and rest.    ADL's / Homemaking Assistance Needed: [place] aide coming 2x/wk to assist w/ shower transfer and to  wash up.  Dresses independently        Hand Dominance   Dominant Hand: Right    Extremity/Trunk Assessment   Upper Extremity Assessment: Defer to OT evaluation           Lower Extremity Assessment: Overall WFL for tasks assessed (Bil LE swelling)      Cervical / Trunk Assessment:  Kyphotic  Communication   Communication: HOH;Other (comment) (has hearing aides)  Cognition Arousal/Alertness: Awake/alert Behavior During Therapy: WFL for tasks assessed/performed Overall Cognitive Status: Within Functional Limits for tasks assessed                      General Comments General comments (skin integrity, edema, etc.): Educated pt on importance of keeping LEs elevated, pt understanding and appreciative.    Exercises General Exercises - Lower Extremity Ankle Circles/Pumps: AROM;Both;10 reps;Seated      Assessment/Plan    PT Assessment Patient needs continued PT services  PT Diagnosis Difficulty walking   PT Problem List Decreased activity tolerance;Decreased balance;Decreased safety awareness;Cardiopulmonary status limiting activity  PT Treatment Interventions DME instruction;Gait training;Functional mobility training;Therapeutic activities;Therapeutic exercise;Balance training;Patient/family education   PT Goals (Current goals can be found in the Care Plan section) Acute Rehab PT Goals Patient Stated Goal: to get stronger PT Goal Formulation: With patient Time For Goal Achievement: 03/13/16 Potential to Achieve Goals: Good    Frequency Min 2X/week   Barriers to discharge Decreased caregiver support Lives alone    Co-evaluation               End of Session Equipment Utilized During Treatment: Gait belt;Oxygen Activity Tolerance: Patient limited by fatigue Patient left: in chair;with call bell/phone within reach;with chair alarm set Nurse Communication: Mobility status;Other (comment) (HR)         Time: [id number] PT Time Calculation (min) (ACUTE ONLY): 18 min   Charges:   PT Evaluation $PT Eval Low Complexity: 1 Procedure     PT G Codes:       Collie Siad PT, DPT  Pager: [phone] Phone: [phone] 02/28/2016, 3:55 PM

## 2016-02-29 ENCOUNTER — Encounter (HOSPITAL_COMMUNITY): Payer: Self-pay | Admitting: Cardiology

## 2016-02-29 ENCOUNTER — Other Ambulatory Visit: Payer: Self-pay | Admitting: *Deleted

## 2016-02-29 ENCOUNTER — Other Ambulatory Visit: Payer: Self-pay | Admitting: Medical

## 2016-02-29 DIAGNOSIS — I5023 Acute on chronic systolic (congestive) heart failure: Secondary | ICD-10-CM

## 2016-02-29 DIAGNOSIS — J8410 Pulmonary fibrosis, unspecified: Secondary | ICD-10-CM

## 2016-02-29 LAB — CBC
HCT: 36.3 % — ABNORMAL LOW (ref 39.0–52.0)
Hemoglobin: 11.3 g/dL — ABNORMAL LOW (ref 13.0–17.0)
MCH: 27.6 pg (ref 26.0–34.0)
MCHC: 31.1 g/dL (ref 30.0–36.0)
MCV: 88.5 fL (ref 78.0–100.0)
PLATELETS: 186 10*3/uL (ref 150–400)
RBC: 4.1 MIL/uL — AB (ref 4.22–5.81)
RDW: 18 % — AB (ref 11.5–15.5)
WBC: 10.1 10*3/uL (ref 4.0–10.5)

## 2016-02-29 LAB — BASIC METABOLIC PANEL
Anion gap: 9 (ref 5–15)
BUN: 37 mg/dL — AB (ref 6–20)
CHLORIDE: 98 mmol/L — AB (ref 101–111)
CO2: 31 mmol/L (ref 22–32)
CREATININE: 1.48 mg/dL — AB (ref 0.61–1.24)
Calcium: 8.9 mg/dL (ref 8.9–10.3)
GFR calc Af Amer: 49 mL/min — ABNORMAL LOW (ref >60)
GFR calc non Af Amer: 42 mL/min — ABNORMAL LOW (ref >60)
GLUCOSE: 189 mg/dL — AB (ref 65–99)
Potassium: 4 mmol/L (ref 3.5–5.1)
Sodium: 138 mmol/L (ref 135–145)

## 2016-02-29 LAB — GLUCOSE, CAPILLARY
GLUCOSE-CAPILLARY: 196 mg/dL — AB (ref 65–99)
Glucose-Capillary: 166 mg/dL — ABNORMAL HIGH (ref 65–99)
Glucose-Capillary: 203 mg/dL — ABNORMAL HIGH (ref 65–99)
Glucose-Capillary: 184 mg/dL — ABNORMAL HIGH (ref 65–99)

## 2016-02-29 LAB — EXPECTORATED SPUTUM ASSESSMENT W GRAM STAIN, RFLX TO RESP C

## 2016-02-29 LAB — EXPECTORATED SPUTUM ASSESSMENT W REFEX TO RESP CULTURE

## 2016-02-29 MED ORDER — PREDNISONE 50 MG PO TABS
50.0000 mg | ORAL_TABLET | Freq: Every day | ORAL | Status: DC
  Administered 2016-03-01 – 2016-03-04 (×4): 50 mg via ORAL
  Filled 2016-02-29 (×4): qty 1

## 2016-02-29 NOTE — Care Management Important Message (Signed)
Important Message  Patient Details  Name: Christopher Holder MRN: [id number] Date of Birth: 08-05-1930   Medicare Important Message Given:  Yes    [name] 02/29/2016, 1:46 PM

## 2016-02-29 NOTE — Consult Note (Signed)
Reason for Consult: CHF   Referring Physician: Dr. Doyle Askew   PCP:  Mackie Pai, PA-C  Primary Cardiologist: Dr. Marlou Porch HF: Dr. Haroldine Laws EP: Dr. Dolores Lory is an 82 y.o. male.    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 Has seen Dr. Rayann Heman 10/09/15 (hx of bradycardia on BB)-he did not feel that with QRS of only 120-130 msec that he would receive any benefit from CRT. And A fib was rate controlled at that time.    He was been followed in the HF clinic and was seen April At that time he was stable and losartan added. Weight at that time was 263 pounds. Grand-daughter lives with him and works at [employer] in [employer].  Was hospitalized 02/03/16 and seen by HF team.  Wt at that discharge 254 lbs.    Admitted 02/26/16 for dyspnea that began 2 weeks ago. He tells me when he arrived home from rehab stay he was dyspneic and had lower ext edema.  Dx. Of acute on chronic HF, systolic and nn compliance with diet. Wt on admit at 269.    BNP 999, K+ 3.3, Cr 1.33 troponin 0.03-->0.04-->0.06.  D-Dimer was 0.52   Labs now improved with K+ 4.0, Cr. 1.48,   CXR with cardiomegaly and edema, ? Possible infiltrate  CT chest with Rt pl. Effusion and mild Lt pleural effusion.  Acute LLL and RUL CAP and urine  EKG:  A fib with RVR at 103, LBBB no acute changes  Pt has diuresed negative 2,915 since admit and wt at 267 lbs.   Today still with dyspnea with talking and congested cough  TELE:  A fib with rates elevated at times to 130.  Palative care has seen the pt -he is currently full code  Cardiac Tests -Echo 08/21/15 LVEF 25-30%, Severe diffuse HK, Moderate MR, PA peak pressure 41 mm Hg ECHO 02/03/2016 Left ventricle: The cavity size was normal.  Systolic function was severely reduced. The estimated ejection fraction was in the range of 20% to 25%. Diffuse hypokinesis. The study is not technically sufficient to allow evaluation of LV diastolic function  PA pk pressure 40 mm Hg -Nuclear Stress 09/25/15 - Indications, with elevated troponins with acute illness and low EF on echo. Showed EF 36%, High risk study with severe LVE suggestion of TID. Small inferior wall infarct from apex to base.  -LHC 10/02/15 with normal coronaries and widely patent D1 stent, no culprit lesions for previous HIGH RISK stress test. Elevated LVEDP and severe LV systolic dysfunction.  Past Medical History  Diagnosis Date  . Diabetes mellitus without complication ([place])   . CHF (congestive heart failure) ([place])   . Coronary artery disease   . COPD (chronic obstructive pulmonary disease) ([place])   . Asthma   . Hypertension   . HOH (hard of hearing)   . Sleep apnea   . Myocardial infarction ([place])   . Hyperlipidemia 05/27/2015  . GERD (gastroesophageal reflux disease) 05/27/2015  . AKI (acute kidney injury) ([place]) 08/18/2015  . Arthritis   . Pulmonary fibrosis [facility])     Past Surgical History  Procedure Laterality Date  . Coronary angioplasty with stent placement    .  Knee surgery    . Total knee arthroplasty      Knee replacement x2  . Cardioversion [name] 08/28/2015    Procedure: CARDIOVERSION;  Surgeon: Jerline Pain, MD;  Location: [place];  Service: Cardiovascular;  Laterality: [name];  . Cardiac catheterization [name] 10/02/2015    Procedure: Left Heart Cath and Coronary Angiography;  Surgeon: Leonie Man, MD;  Location: [place] [facility];  Service: Cardiovascular;  Laterality: [name];  . Joint replacement      Family History  Problem Relation Age of Onset  . Cancer Mother   . Diabetes Father   . Heart attack Brother   . Heart attack Sister    Social History:  reports that he has never smoked. He has never used smokeless tobacco. He reports that he does not  drink alcohol or use illicit drugs.  Allergies:  Allergies  Allergen Reactions  . Percocet [Oxycodone-Acetaminophen] Diarrhea, Nausea And Vomiting and Nausea Only  . Sulfa Antibiotics Diarrhea and Nausea And Vomiting    OUTPATIENT MEDICATIONS: No current facility-administered medications on file prior to encounter.   Current Outpatient Prescriptions on File Prior to Encounter  Medication Sig Dispense Refill  . albuterol (PROVENTIL HFA;VENTOLIN HFA) 108 (90 BASE) MCG/ACT inhaler Inhale 2 puffs into the lungs every 8 (eight) hours.     Marland Kitchen atorvastatin (LIPITOR) 10 MG tablet Take 10 mg by mouth at bedtime.     . carvedilol (COREG) 3.125 MG tablet 1 tab po bid (Patient taking differently: Take 3.125 mg by mouth 2 (two) times daily. ) 180 tablet 3  . DULERA 200-5 MCG/ACT AERO Use 2 puffs two times daily (Patient taking differently: Use 2 puffs every 8 hours) 26 g 1  . ELIQUIS 5 MG TABS tablet Take 1 tablet (5 mg total) by mouth 2 (two) times daily. 120 tablet 2  . fluticasone (FLONASE) 50 MCG/ACT nasal spray Use 2 sprays in each  nostril daily 32 g 1  . furosemide (LASIX) 40 MG tablet Take 2 tablets (80 mg total) by mouth 2 (two) times daily. 120 tablet 1  . glipiZIDE (GLUCOTROL) 5 MG tablet Take 1 tablet (5 mg total) by mouth 2 (two) times daily before a meal. Reported on 12/29/2015 (Patient taking differently: Take 5 mg by mouth daily before breakfast. Reported on 12/29/2015) 180 tablet 3  . losartan (COZAAR) 25 MG tablet Take 25 mg by mouth daily.    . NON FORMULARY CPAP at night (setting is at 15)    . tiotropium (SPIRIVA) 18 MCG inhalation capsule Place 1 capsule (18 mcg total) into inhaler and inhale daily. 30 capsule 12  . traZODone (DESYREL) 50 MG tablet 1/2 tab po q hs (Patient taking differently: Take 25 mg by mouth at bedtime. ) 15 tablet 3   CURRENT MEDICATIONS: Scheduled Meds: . allopurinol  100 mg Oral BID  . apixaban  2.5 mg Oral BID  . aspirin EC  81 mg Oral Daily  .  atorvastatin  10 mg Oral QHS  . carvedilol  3.125 mg Oral BID WC  . doxycycline  100 mg Oral Q12H  . fluticasone  2 spray Each Nare Daily  . furosemide  60 mg Intravenous BID  . guaiFENesin  600 mg Oral BID  . insulin aspart  0-9 Units Subcutaneous TID WC  . ipratropium-albuterol  3 mL Nebulization TID  . losartan  25 mg Oral Daily  . methylPREDNISolone (SOLU-MEDROL) injection  40 mg Intravenous Q12H  . mometasone-formoterol  2 puff Inhalation BID  .  montelukast  10 mg Oral QHS  . pantoprazole  40 mg Oral Daily  . potassium chloride  40 mEq Oral Daily  . sodium chloride flush  3 mL Intravenous Q12H  . tiotropium  18 mcg Inhalation Daily  . traZODone  25 mg Oral QHS   Continuous Infusions:  PRN Meds:.sodium chloride, guaiFENesin, levalbuterol, sodium chloride flush   Results for orders placed or performed during the hospital encounter of 02/26/16 (from the past 48 hour(s))  Troponin I (q 6hr x 3)     Status: Abnormal   Collection Time: 02/27/16  3:53 PM  Result Value Ref Range   Troponin I 0.06 (H) <0.031 ng/mL    Comment:        PERSISTENTLY INCREASED TROPONIN VALUES IN THE RANGE OF 0.04-0.49 ng/mL CAN BE SEEN IN:       -UNSTABLE ANGINA       -CONGESTIVE HEART FAILURE       -MYOCARDITIS       -CHEST TRAUMA       -ARRYHTHMIAS       -LATE PRESENTING MYOCARDIAL INFARCTION       -COPD   CLINICAL FOLLOW-UP RECOMMENDED.   Glucose, capillary     Status: Abnormal   Collection Time: 02/27/16  3:57 PM  Result Value Ref Range   Glucose-Capillary 128 (H) 65 - 99 mg/dL   Comment 1 Notify RN    Comment 2 Document in Chart   Glucose, capillary     Status: Abnormal   Collection Time: 02/27/16  9:11 PM  Result Value Ref Range   Glucose-Capillary 160 (H) 65 - 99 mg/dL   Comment 1 Notify RN    Comment 2 Document in Chart   Troponin I (q 6hr x 3)     Status: None   Collection Time: 02/27/16  9:26 PM  Result Value Ref Range   Troponin I 0.03 <0.031 ng/mL    Comment:        NO  INDICATION OF MYOCARDIAL INJURY.   Troponin I (q 6hr x 3)     Status: None   Collection Time: 02/28/16  3:58 AM  Result Value Ref Range   Troponin I 0.03 <0.031 ng/mL    Comment:        NO INDICATION OF MYOCARDIAL INJURY.   Basic metabolic panel     Status: Abnormal   Collection Time: 02/28/16  3:58 AM  Result Value Ref Range   Sodium 138 135 - 145 mmol/L   Potassium 4.0 3.5 - 5.1 mmol/L    Comment: DELTA CHECK NOTED   Chloride 100 (L) 101 - 111 mmol/L   CO2 30 22 - 32 mmol/L   Glucose, Bld 172 (H) 65 - 99 mg/dL   BUN 29 (H) 6 - 20 mg/dL   Creatinine, Ser 1.54 (H) 0.61 - 1.24 mg/dL   Calcium 9.0 8.9 - 10.3 mg/dL   GFR calc non Af Amer 40 (L) >60 mL/min   GFR calc Af Amer 46 (L) >60 mL/min    Comment: (NOTE) The eGFR has been calculated using the CKD EPI equation. This calculation has not been validated in all clinical situations. eGFR's persistently <60 mL/min signify possible Chronic Kidney Disease.    Anion gap 8 5 - 15  CBC     Status: Abnormal   Collection Time: 02/28/16  3:58 AM  Result Value Ref Range   WBC 5.6 4.0 - 10.5 K/uL   RBC 4.17 (L) 4.22 - 5.81 MIL/uL   Hemoglobin  11.5 (L) 13.0 - 17.0 g/dL   HCT 37.1 (L) 39.0 - 52.0 %   MCV 89.0 78.0 - 100.0 fL   MCH 27.6 26.0 - 34.0 pg   MCHC 31.0 30.0 - 36.0 g/dL   RDW 18.2 (H) 11.5 - 15.5 %   Platelets 191 150 - 400 K/uL  Glucose, capillary     Status: Abnormal   Collection Time: 02/28/16  5:34 AM  Result Value Ref Range   Glucose-Capillary 174 (H) 65 - 99 mg/dL   Comment 1 Notify RN    Comment 2 Document in Chart   Glucose, capillary     Status: Abnormal   Collection Time: 02/28/16 11:18 AM  Result Value Ref Range   Glucose-Capillary 271 (H) 65 - 99 mg/dL   Comment 1 Notify RN    Comment 2 Document in Chart   Glucose, capillary     Status: Abnormal   Collection Time: 02/28/16  4:25 PM  Result Value Ref Range   Glucose-Capillary 227 (H) 65 - 99 mg/dL   Comment 1 Notify RN    Comment 2 Document in Chart    Strep pneumoniae urinary antigen     Status: Abnormal   Collection Time: 02/28/16  6:18 PM  Result Value Ref Range   Strep Pneumo Urinary Antigen POSITIVE (A) NEGATIVE  Glucose, capillary     Status: Abnormal   Collection Time: 02/28/16 11:18 PM  Result Value Ref Range   Glucose-Capillary 178 (H) 65 - 99 mg/dL   Comment 1 Notify RN    Comment 2 Document in Chart   Basic metabolic panel     Status: Abnormal   Collection Time: 02/29/16  3:50 AM  Result Value Ref Range   Sodium 138 135 - 145 mmol/L   Potassium 4.0 3.5 - 5.1 mmol/L   Chloride 98 (L) 101 - 111 mmol/L   CO2 31 22 - 32 mmol/L   Glucose, Bld 189 (H) 65 - 99 mg/dL   BUN 37 (H) 6 - 20 mg/dL   Creatinine, Ser 1.48 (H) 0.61 - 1.24 mg/dL   Calcium 8.9 8.9 - 10.3 mg/dL   GFR calc non Af Amer 42 (L) >60 mL/min   GFR calc Af Amer 49 (L) >60 mL/min    Comment: (NOTE) The eGFR has been calculated using the CKD EPI equation. This calculation has not been validated in all clinical situations. eGFR's persistently <60 mL/min signify possible Chronic Kidney Disease.    Anion gap 9 5 - 15  CBC     Status: Abnormal   Collection Time: 02/29/16  3:50 AM  Result Value Ref Range   WBC 10.1 4.0 - 10.5 K/uL   RBC 4.10 (L) 4.22 - 5.81 MIL/uL   Hemoglobin 11.3 (L) 13.0 - 17.0 g/dL   HCT 36.3 (L) 39.0 - 52.0 %   MCV 88.5 78.0 - 100.0 fL   MCH 27.6 26.0 - 34.0 pg   MCHC 31.1 30.0 - 36.0 g/dL   RDW 18.0 (H) 11.5 - 15.5 %   Platelets 186 150 - 400 K/uL  Glucose, capillary     Status: Abnormal   Collection Time: 02/29/16  6:12 AM  Result Value Ref Range   Glucose-Capillary 166 (H) 65 - 99 mg/dL   Comment 1 Notify RN    Comment 2 Document in Chart   Culture, expectorated sputum-assessment     Status: None   Collection Time: 02/29/16  9:27 AM  Result Value Ref Range   Specimen Description SPUTUM  Special Requests NONE    Sputum evaluation      THIS SPECIMEN IS ACCEPTABLE. RESPIRATORY CULTURE REPORT TO FOLLOW.   Report Status  02/29/2016 FINAL   Culture, respiratory (NON-Expectorated)     Status: None (Preliminary result)   Collection Time: 02/29/16  9:27 AM  Result Value Ref Range   Specimen Description SPUTUM    Special Requests NONE    Gram Stain      FEW WBC PRESENT, PREDOMINANTLY MONONUCLEAR ABUNDANT GRAM POSITIVE COCCI IN PAIRS ABUNDANT GRAM VARIABLE ROD    Culture PENDING    Report Status PENDING   Glucose, capillary     Status: Abnormal   Collection Time: 02/29/16 11:38 AM  Result Value Ref Range   Glucose-Capillary 203 (H) 65 - 99 mg/dL   Ct Chest Wo Contrast  02/27/2016  CLINICAL DATA:  Dyspnea, shortness of breath, asthma EXAM: CT CHEST WITHOUT CONTRAST TECHNIQUE: Multidetector CT imaging of the chest was performed following the standard protocol without IV contrast. COMPARISON:  12/16/2015 FINDINGS: Mediastinum/Lymph Nodes: Secretions are noted in mid trachea please see axial image 32. No evidence of airway obstruction. A right lower paratracheal lymph node again noted Measures 1.4 cm short-axis without change from prior exam. Atherosclerotic calcifications of coronary arteries. Cardiomegaly again noted. Mild atherosclerotic calcifications of thoracic aorta. Ascending aorta measures 4.3 cm in diameter without change from prior exam. Lungs/Pleura: There is small to moderate right pleural effusion. Small left pleural effusion. Again noted chronic interstitial coarsening bilaterally. Peripheral reticulation and architectural distortion consistent with fibrotic changes again noted. Findings consistent with pulmonary fibrosis. There is patchy superimposed peripheral ground-glass attenuation in right upper lobe. There is patchy airspace disease in left lower lobe. Findings highly suspicious for superimposed infiltrate/ pneumonia. Follow-up to resolution after treatment is recommended. Upper abdomen: The visualized upper abdomen shows multiple small calcified gallstones within gallbladder the largest measures 5 mm.  No adrenal gland mass is noted. There is atrophic pancreas. Visualized unenhanced spleen is unremarkable. Visualized unenhanced liver shows no intrahepatic biliary ductal dilatation. Musculoskeletal: Sagittal images of the spine shows degenerative changes thoracic spine. Sagittal view of the sternum is unremarkable. No destructive rib lesions are noted. IMPRESSION: 1. Again noted chronic interstitial coarsening bilaterally. Peripheral reticulation and architectural distortion consistent with fibrotic changes again noted. Findings consistent with pulmonary fibrosis. There is patchy superimposed peripheral ground-glass attenuation in right upper lobe. There is patchy airspace disease in left lower lobe. Findings highly suspicious for superimposed infiltrate/ pneumonia. Follow-up to resolution after treatment is recommended. 2. Stable mediastinal adenopathy. 3. Small dependent secretions are noted in mid trachea without evidence of airway obstruction. 4. There is small to moderate right pleural effusion. Small left pleural effusion. 5. Degenerative changes thoracolumbar spine. 6. Calcified gallstones are noted within gallbladder. Electronically Signed   By: Lahoma Crocker M.D.   On: 02/27/2016 19:54    ROS: General:no colds or fevers, + weight increase during rehab stay  Skin:no rashes or ulcers HEENT:no blurred vision, no congestion CV:see HPI PUL:see HPI GI:no diarrhea constipation or melena, no indigestion GU:no hematuria, no dysuria MS:no joint pain, no claudication Neuro:no syncope, no lightheadedness Endo:+ diabetes, no thyroid disease   Blood pressure 98/55, pulse 86, temperature 97.9 F (36.6 C), temperature source Oral, resp. rate 20, height 6' (1.829 m), weight 267 lb (121.11 kg), SpO2 100 %.  Wt Readings from Last 3 Encounters:  02/29/16 267 lb (121.11 kg)  02/10/16 264 lb 12.8 oz (120.112 kg)  02/05/16 254 lb 12.8 oz (115.577 kg)  PE:  General:Pleasant affect, NAD but with talking he  develops SOB Skin:Warm and dry, brisk capillary refill HEENT:normocephalic, sclera clear, mucus membranes moist Neck:supple, + JVD to Jaw, no bruits  Heart:irreg irreg without murmur, gallup, rub or click Lungs: with harsh rales and rhonchi, occ wheezes JQB:HALP, non tender, + BS, do not palpate liver spleen or masses Ext: 1-2+ lower ext edema, but improved,  2+ pedal pulses, 2+ radial pulses Neuro:alert and oriented X 3, MAE, follows commands, + facial symmetry   Assessment/Plan Active Problems:   CHF (congestive heart failure) (HCC)   Heart failure (HCC)   Acute on chronic systolic congestive heart failure (HCC)   Chronic obstructive pulmonary disease (HCC)   Palliative care encounter   Dyspnea   Pulmonary fibrosis ([place])  1. Dyspnea - Multifactorial given COPD and pulmonary fibrosis.now with RUL and LLL Acute CAP on abx.  -doxycycline, also on solumedrol.  2. Acute on Chronic Systolic HF- NICM ECHO 37/9024 EF 25-30% .  - Echo 02/03/16 LVEF 20-25%, Mod AI, Mod MR, Mod LAE/RAE, PA peak pressure 40 mm Hg. -> Slightly decreased from previous. - not a candidate for ICD due to advanced age On admitNYHA III. Wt up and  CXR with edema in addition to infiltrate.  - lasix 60 mg IV BID continue - coreg decreased to 3.125 mg twice a day - Continue 25 mg losartan daily. Hold off on entresto for now with BP.  - Spironolactone is at 12.5 mg   3. Atrial Fibrillation-Rate up to 120 at timesOn eliquis. On carvedilol.  chads2vasc score is at least 6. - rate is elevated most likely due to PNA.   Could eventually consider DC-CV but has been well compensated with rate control.  - Would likely not be good candidate for long term antiarrythmic with pulmonary fibrosis.  - eliquis decreased to 2.5 mg BID due to increased Cr.    4. CAD- S/P D1 Stent -patent LHC 2017 On eliquis. On statin and BB  5. HTN-  - Stable to low on current regimen.  6. HLD- on statin 7. CKD stage III -  Stable. Follow BMETs  8. COPD 9. OSA with CPAP- continue nightly  10. Pulmonary Fibrosis- followed in the community by Dr Halford Chessman.   Dr. Haroldine Laws to see this evening.   [name]  Nurse Practitioner [employer] [facility] Pager [phone] or after 5pm or weekends call [phone] 02/29/2016, 3:26 PM     Patient seen and examined discussed with [name] NP-C.  I agree with the assessment and plan as stated above.   His dyspnea is clearly multifactorial but also has significant volume overload. Agree with IV diuresis. AF rate improved slightly. Continue Eliquis for anticoagulation. CAD is stable. Palliative Care note reviewed.  I agree that he will likely need placement to avoid recurrent hospitalizations. We will continue to follow.   Bensimhon, Daniel,MD 9:01 PM

## 2016-02-29 NOTE — Progress Notes (Signed)
Soc Worker following for transitioning to ALF with hospice care; Aneta Mins [phone]

## 2016-02-29 NOTE — Progress Notes (Addendum)
Patient ID: Christopher Holder, male   DOB: 1930/07/08, 82 y.o.   MRN: [id number]    PROGRESS NOTE    Christopher Holder  [id number] DOB: 1930-07-03 DOA: 02/26/2016  PCP: Mackie Pai, PA-C   Brief Narrative:  Patient is a 82 yo male with known chronic combined CHF and pulmonary fibrosis, EF 20-25%, presented to [facility] ED with main concern of progressive dyspnea with exertion and at rest.   Assessment & Plan:  Acute hypoxic respiratory failure due to acute on chronic combined CHF, pulmonary fibrosis, AECOPD, acute LLL and RUL CAP, unknown pathogen  - 2D echo EF 20-25% in the past month so no need to repeat new ECHO - pt reports feeling better this AM but still with exertional dyspnea and rales on physical exam  - currently he is on Lasix IV BID, suspect he needs higher dose but further dose titration limited by low BP - weight 120 kg --> 121 kg this AM - monitor daily weights, strict I/O - consult cardiology for assistance   LLL and RUL PNA, CAP - continue with doxycycline, BD as needed - follow up on strep pneumo, sputum cultures, negative to date   AECOPD, pulmonary fibrosis - transition solumedrol to oral Prednisone  - allow BD scheduled and as needed   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  - hold home glipizide, SSI while inpatient   CKD stage III  - review of records indicate GFR in 40's with baseline Cr ~ 1.4 - Cr trend: 1.3 --> 1.54 --> 1.48 - BMP in AM  PAF, CHADS2 score 5 - continue Coreg, Eliquis - rate controlled for now  - keep on telemetry   Hypokalemia due to lasix use - supplemented and WNL this AM   HTN, essential  - continue home medications   CAD - stable no chest pain, continue current regimen - trop elevated, flat   Morbid obesity due to excess calories - pt meets criteria with BMI > 35 and underlying risks of HTN, DM - Body mass index is 36.01 kg/(m^2)  DVT prophylaxis: Eliquis Code Status: Full Family Communication: Patient at  bedside  Disposition Plan: SNF when more diuresed   Consultants:   Cardiology   Procedures:   None  Antimicrobials:   Doxycycline    Subjective: Reports feeling better but still with dyspnea mostly with exertion.   Objective: Filed Vitals:   02/28/16 2331 02/29/16 0617 02/29/16 0822 02/29/16 1154  BP: 111/52 98/63 96/79  98/55  Pulse: 97 96 130 86  Temp:  97.5 F (36.4 C) 98.4 F (36.9 C) 97.9 F (36.6 C)  TempSrc:  Oral Oral Oral  Resp: 20 20 24 20   Height:      Weight:  121.11 kg (267 lb)    SpO2: 97% 95% 98% 100%    Intake/Output Summary (Last 24 hours) at 02/29/16 1616 Last data filed at 02/29/16 1453  Gross per 24 hour  Intake   1560 ml  Output   2365 ml  Net   -805 ml   Filed Weights   02/27/16 0012 02/28/16 0539 02/29/16 0617  Weight: 120.475 kg (265 lb 9.6 oz) 120.203 kg (265 lb) 121.11 kg (267 lb)    Examination:  General exam: Appears calm and comfortable if sitting in chair, dyspnea with standing and even few steps walking  Respiratory system: rales bilaterally with minimal wheezing,  Cardiovascular system:  IRRR. No rubs, gallops or clicks. Bilateral LE edema +2  Gastrointestinal system: Abdomen is nondistended, soft and  nontender.  Central nervous system: Alert and oriented. No focal neurological deficits. Extremities: Symmetric 5 x 5 power. + 2 bilateral LE pitting edema    Data Reviewed: I have personally reviewed following labs and imaging studies  CBC:  Recent Labs Lab 02/26/16 1654 02/28/16 0358 02/29/16 0350  WBC 7.4 5.6 10.1  HGB 12.0* 11.5* 11.3*  HCT 37.8* 37.1* 36.3*  MCV 87.5 89.0 88.5  PLT 191 191 [id number]   Basic Metabolic Panel:  Recent Labs Lab 02/26/16 1654 02/27/16 0227 02/28/16 0358 02/29/16 0350  NA 135 139 138 138  K 4.1 3.3* 4.0 4.0  CL 100* 100* 100* 98*  CO2 26 30 30 31   GLUCOSE 128* 133* 172* 189*  BUN 22* 22* 29* 37*  CREATININE 1.38* 1.33* 1.54* 1.48*  CALCIUM 9.1 8.8* 9.0 8.9   Liver Function  Tests:  Recent Labs Lab 02/27/16 0227  AST 24  ALT 25  ALKPHOS 90  BILITOT 0.9  PROT 6.0*  ALBUMIN 3.1*   Cardiac Enzymes:  Recent Labs Lab 02/26/16 2231 02/27/16 0227 02/27/16 1553 02/27/16 2126 02/28/16 0358  TROPONINI 0.03 0.04* 0.06* 0.03 0.03   BNP (last 3 results)  Recent Labs  10/06/15 1153 10/21/15 1106 01/05/16 1148  PROBNP 474.0* 318.0* 538.0*   CBG:  Recent Labs Lab 02/28/16 1118 02/28/16 1625 02/28/16 2318 02/29/16 0612 02/29/16 1138  GLUCAP 271* 227* 178* 166* 203*   Urine analysis:    Component Value Date/Time   COLORURINE YELLOW 12/16/2015 0011   APPEARANCEUR CLEAR 12/16/2015 0011   LABSPEC 1.013 12/16/2015 0011   PHURINE 6.0 12/16/2015 0011   GLUCOSEU NEGATIVE 12/16/2015 0011   HGBUR NEGATIVE 12/16/2015 0011   BILIRUBINUR NEGATIVE 12/16/2015 0011   KETONESUR NEGATIVE 12/16/2015 0011   PROTEINUR NEGATIVE 12/16/2015 0011   NITRITE NEGATIVE 12/16/2015 0011   LEUKOCYTESUR NEGATIVE 12/16/2015 0011     Radiology Studies: Dg Chest 2 View 02/26/2016  Cardiomegaly and edema. Focal opacity in the right lateral lung.   Ct Chest Wo Contrast 02/27/2016   Again noted chronic interstitial coarsening bilaterally. Peripheral reticulation and architectural distortion consistent with fibrotic changes again noted. Findings consistent with pulmonary fibrosis. There is patchy superimposed peripheral ground-glass attenuation in right upper lobe. There is patchy airspace disease in left lower lobe. Findings highly suspicious for superimposed infiltrate/ pneumonia. Follow-up to resolution after treatment is recommended. 2. Stable mediastinal adenopathy. 3. Small dependent secretions are noted in mid trachea without evidence of airway obstruction. 4. There is small to moderate right pleural effusion. Small left pleural effusion. 5. Degenerative changes thoracolumbar spine.   Scheduled Meds: . allopurinol  100 mg Oral BID  . apixaban  2.5 mg Oral BID  .  aspirin EC  81 mg Oral Daily  . atorvastatin  10 mg Oral QHS  . carvedilol  3.125 mg Oral BID WC  . doxycycline  100 mg Oral Q12H  . fluticasone  2 spray Each Nare Daily  . furosemide  60 mg Intravenous BID  . guaiFENesin  600 mg Oral BID  . insulin aspart  0-9 Units Subcutaneous TID WC  . ipratropium-albuterol  3 mL Nebulization TID  . losartan  25 mg Oral Daily  . methylPREDNISolone (SOLU-MEDROL) injection  40 mg Intravenous Q12H  . mometasone-formoterol  2 puff Inhalation BID  . montelukast  10 mg Oral QHS  . pantoprazole  40 mg Oral Daily  . potassium chloride  40 mEq Oral Daily  . sodium chloride flush  3 mL Intravenous Q12H  . tiotropium  18 mcg Inhalation Daily  . traZODone  25 mg Oral QHS   Continuous Infusions:  Time spent: 20 minutes   Faye Ramsay, MD [facility] Pager [phone]  If 7PM-7AM, please contact night-coverage [email or web address] Password [facility] 02/29/2016, 4:16 PM

## 2016-02-29 NOTE — Progress Notes (Signed)
   02/29/16 1000  Clinical Encounter Type  Visited With Patient  Visit Type Initial;Psychological support;Spiritual support;Social support  Referral From Palliative care team  Spiritual Encounters  Spiritual Needs Emotional;Prayer  Ch responded to Palliative consult; [place] sat bedside with pt who was lucid in conversation about his church and current events; not sure if he full comprehends everything regarding care; daughter and granddaughter coming to support as wife going to wedding this coming Saturday;  Look forward to another visit.  Gwynn Burly 10:11 AM

## 2016-02-29 NOTE — Patient Outreach (Signed)
[facility]) Care Management  02/29/2016  Tollie Gargus August 15, 1930 [id number]  Pt has readmitted to inpt status on 6/9 for CHF and COPD. Note [facility] social worker is currently following pt while at SNF (last location [facility]) pending [facility] for permanent placement at that time.  RN will continue to follow per policy if pt returns to community.   Raina Mina, RN Care Management Coordinator [place] Office [phone]

## 2016-02-29 NOTE — Clinical Social Work Note (Signed)
CSW spoke with patient's daughter about SNF recommendation. Patient's daughter wants patient to go wherever he will benefit most. Patient's daughter had questions about what insurance would pay for. CSW spoke with [facility] and she advised that patient's daughter contact his insurance provider to get specifics on benefits. CSW left voicemail for patient's daughter with this information.  Dayton Scrape, [place]

## 2016-02-29 NOTE — Consult Note (Signed)
   [facility] CM Inpatient Consult   02/29/2016  Christopher Holder 12/28/1929 [id number]   Patient has been active with [place] [facility] for chronic disease management services and community support for resources prior to admission.  Patient has been engaged by the [facility] CSW in which she was working with the family for transitioning this patient to an Emergency planning/management officer. Please review [facility] CSW notes for details.  Made inpatient CSW that [place] [facility] was active prior to admission. Chart review reveals that the patient and family will likely pursue an ALF with Hospice/Palliative services, in which the patient will receive full post hospital community care management services with them and [facility] Care Management will no longer follow.  Of note, [facility] Care Management services can be  arranged if the current discharge plan for hospice care changes.  For additional questions or referrals please contact:  Natividad Brood, RN BSN [facility] Liaison  [phone] business mobile phone Toll free office [phone]

## 2016-02-29 NOTE — Clinical Social Work Note (Signed)
Clinical Social Work Assessment  Patient Details  Name: Christopher Holder MRN: [id number] Date of Birth: 1930-07-16  Date of referral:  02/29/16               Reason for consult:  Facility Placement, Discharge Planning                Permission sought to share information with:  Facility Sport and exercise psychologist, Family Supports Permission granted to share information::  Yes, Verbal Permission Granted  Name::     Gwynneth Albright::  SNF's/ALF  Relationship::  Daughter  Contact Information:  [phone]  Housing/Transportation Living arrangements for the past 2 months:  [place], [place] of Information:  Patient, Medical Team Patient Interpreter Needed:  None Criminal Activity/Legal Involvement Pertinent to Current Situation/Hospitalization:  No - Comment as needed Significant Relationships:  Adult Children, Other Family Members Lives with:  Facility Resident, Other (Comment) (Granddaughter) Do you feel safe going back to the place where you live?  Yes Need for family participation in patient care:  Yes (Comment)  Care giving concerns:  PT recommending SNF at discharge.   Social Worker assessment / plan:  CSW met with patient. No supports at bedside. CSW introduced role and explained that discharge planning would be discussed. Patient remembered CSW from last admission. Patient recently discharged from [facility]. He went home for a few hours and noticed swelling and shortness of breath. Discussed whether patient would go to SNF again or ALF [facility]) with hospice. Patient was unable to provide answer but gave verbal permission to contact his daughter, Mardene Celeste. CSW called and left voicemail. No further concerns. CSW encouraged patient to contact CSW as needed. CSW will continue to follow patient and his family and facilitate discharge to SNF or ALF with hospice.  Employment status:  Retired Nurse, adult PT  Recommendations:  [place] / Referral to community resources:  [place]  Patient/Family's Response to care:  Patient unable to provide answer on whether he wants to go back to SNF or not but gave verbal permission to call his daughter. Left voicemail. Patient's family supportive and involved in patient's care. Patient polite and appreciated social work intervention.  Patient/Family's Understanding of and Emotional Response to Diagnosis, Current Treatment, and Prognosis:  Patient knowledgeable of medical interventions and aware of PT recommendations for SNF. CSW waiting on call back from patient's daughter to confirm plans whether SNF or ALF with hospice.  Emotional Assessment Appearance:  Appears stated age Attitude/Demeanor/Rapport:  Other (Pleasant) Affect (typically observed):  Accepting, Appropriate, Calm, Pleasant Orientation:  Oriented to Self, Oriented to Place, Oriented to  Time, Oriented to Situation Alcohol / Substance use:  Never Used Psych involvement (Current and /or in the community):  No (Comment)  Discharge Needs  Concerns to be addressed:  Care Coordination Readmission within the last 30 days:  Yes Current discharge risk:  Dependent with Mobility, Terminally ill Barriers to Discharge:  Other (Out of Medicare days from recent SNF stay.)   Candie Chroman, LCSW 02/29/2016, 11:34 AM

## 2016-02-29 NOTE — NC FL2 (Signed)
[place] LEVEL OF CARE SCREENING TOOL     IDENTIFICATION  Patient Name: Christopher Holder Birthdate: 10/25/1929 Sex: male Admission Date (Current Location): 02/26/2016  [facility] and [place] Number:  Herbalist and Address:  The . [facility], [place] [address] [place], [place] [address]      Provider Number: [id number]  Attending Physician Name and Address:  Theodis Blaze, MD  Relative Name and Phone Number:       Current Level of Care: Hospital Recommended Level of Care: [place], [place] Prior Approval Number:    Date Approved/Denied:   PASRR Number: [id number]  Discharge Plan: Other (Comment) (SNF or ALF with hospice)    Current Diagnoses: Patient Active Problem List   Diagnosis Date Noted  . Palliative care encounter 02/28/2016  . DNR (do not resuscitate) discussion 02/28/2016  . Dyspnea   . Pulmonary fibrosis ([place])   . Acute on chronic systolic congestive heart failure ([place])   . Chronic obstructive pulmonary disease ([place])   . Heart failure ([place]) 02/26/2016  . Chronic systolic CHF (congestive heart failure), NYHA class 4 ([place]) 02/02/2016  . Pulmonary fibrosis, postinflammatory ([place]) 02/02/2016  . DOE (dyspnea on exertion) 02/02/2016  . Asthma 01/15/2016  . CKD (chronic kidney disease), stage III 12/29/2015  . Renal failure (ARF), acute on chronic (HCC) 12/16/2015  . Hypoxia 12/15/2015  . Pain in lower limb 10/28/2015  . NICM (nonischemic cardiomyopathy) ([place]) 10/02/2015  . Abnormal nuclear stress test 10/01/2015  . Acute respiratory failure ([place])   . Community acquired pneumonia   . Cough with hemoptysis   . Sepsis ([place])   . Chronic systolic congestive heart failure ([place])   . Pulmonary infiltrate   . OSA on CPAP   . Hemoptysis 08/29/2015  . Persistent atrial fibrillation ([place]) 08/26/2015  . Severe sepsis ([place])   . ARDS (adult respiratory distress syndrome) ([place])   . Renal failure, acute  on chronic (HCC)   . Acute systolic CHF (congestive heart failure) ([place])   . Acute respiratory failure with hypoxemia ([place])   . Systolic CHF, acute on chronic (HCC)   . Prolonged QT interval   . Sepsis due to pneumonia ([place]) 08/18/2015  . AKI (acute kidney injury) ([place]) 08/18/2015  . Metabolic encephalopathy [id number]  . Thrombocytopenia ([place]) 08/18/2015  . Acute respiratory failure with hypoxia ([place]) 08/18/2015  . Hard of hearing 08/18/2015  . Chronic sinus bradycardia 08/18/2015  . CAP (community acquired pneumonia) 08/18/2015  . Acute pulmonary edema ([place]) 08/18/2015  . Septic shock ([place]) 08/18/2015  . Chronic cough 08/07/2015  . Onychomycosis 07/28/2015  . Injury by nail 07/28/2015  . Coronary artery disease involving native coronary artery of native heart without angina pectoris 06/19/2015  . Obesity 06/19/2015  . Obstructive sleep apnea 06/19/2015  . Other emphysema ([place]) 06/19/2015  . Diabetes mellitus type 2, controlled ([place]) 05/27/2015  . HTN (hypertension) 05/27/2015  . Hyperlipidemia 05/27/2015  . GERD (gastroesophageal reflux disease) 05/27/2015  . Allergic rhinitis 05/27/2015  . Diabetic neuropathy ([place]) 05/27/2015  . CHF (congestive heart failure) ([place]) 05/27/2015    Orientation RESPIRATION BLADDER Height & Weight     Self, Time, Situation, Place  O2, Other (Comment) (Nasal Canula 3 L. CPAP at night.) Continent Weight: 267 lb (121.11 kg) (a scale) Height:  6' (182.9 cm)  BEHAVIORAL SYMPTOMS/MOOD NEUROLOGICAL BOWEL NUTRITION STATUS   (None)  (None) Continent Diet (Heart healthy)  AMBULATORY STATUS COMMUNICATION OF NEEDS Skin   Limited  Assist Verbally Normal                       Personal Care Assistance Level of Assistance    Bathing Assistance: Limited assistance Feeding assistance: Independent Dressing Assistance: Limited assistance     Functional Limitations Info  Sight, Hearing, Speech Sight Info: Adequate Hearing Info: Impaired Speech Info:  Adequate    SPECIAL CARE FACTORS FREQUENCY  Blood pressure, Diabetic urine testing, PT (By licensed PT)     PT Frequency: 5 x week              Contractures Contractures Info: Not present    Additional Factors Info  Code Status, Allergies Code Status Info: Full Allergies Info: Percoset, Sulfa Antibiotics           Current Medications (02/29/2016):  This is the current hospital active medication list Current Facility-Administered Medications  Medication Dose Route Frequency Provider Last Rate Last Dose  . 0.9 %  sodium chloride infusion  250 mL Intravenous PRN Gennaro Africa, MD      . allopurinol (ZYLOPRIM) tablet 100 mg  100 mg Oral BID Gennaro Africa, MD   100 mg at 02/29/16 0939  . apixaban (ELIQUIS) tablet 2.5 mg  2.5 mg Oral BID Theodis Blaze, MD   2.5 mg at 02/29/16 0939  . aspirin EC tablet 81 mg  81 mg Oral Daily Gennaro Africa, MD   81 mg at 02/29/16 0939  . atorvastatin (LIPITOR) tablet 10 mg  10 mg Oral QHS Gennaro Africa, MD   10 mg at 02/28/16 2135  . carvedilol (COREG) tablet 3.125 mg  3.125 mg Oral BID WC Theodis Blaze, MD   3.125 mg at 02/29/16 0816  . doxycycline (VIBRA-TABS) tablet 100 mg  100 mg Oral Q12H Gennaro Africa, MD   100 mg at 02/29/16 0939  . fluticasone (FLONASE) 50 MCG/ACT nasal spray 2 spray  2 spray Each Nare Daily Gennaro Africa, MD   2 spray at 02/29/16 0940  . furosemide (LASIX) injection 60 mg  60 mg Intravenous BID Gennaro Africa, MD   60 mg at 02/29/16 0817  . guaiFENesin (MUCINEX) 12 hr tablet 600 mg  600 mg Oral BID Theodis Blaze, MD   600 mg at 02/29/16 0939  . guaiFENesin (ROBITUSSIN) 100 MG/5ML solution 200 mg  200 mg Oral Q4H PRN Gennaro Africa, MD   200 mg at 02/28/16 1955  . insulin aspart (novoLOG) injection 0-9 Units  0-9 Units Subcutaneous TID WC Theodis Blaze, MD   2 Units at 02/29/16 [phone]  . ipratropium-albuterol (DUONEB) 0.5-2.5 (3) MG/3ML nebulizer solution 3 mL  3 mL Nebulization TID Theodis Blaze, MD   3 mL at 02/29/16 0730  . levalbuterol  (XOPENEX) nebulizer solution 1.25 mg  1.25 mg Nebulization Q6H PRN Theodis Blaze, MD      . losartan (COZAAR) tablet 25 mg  25 mg Oral Daily Gennaro Africa, MD   25 mg at 02/29/16 0939  . methylPREDNISolone sodium succinate (SOLU-MEDROL) 40 mg/mL injection 40 mg  40 mg Intravenous Q12H Theodis Blaze, MD   40 mg at 02/29/16 0454  . mometasone-formoterol (DULERA) 200-5 MCG/ACT inhaler 2 puff  2 puff Inhalation BID Gennaro Africa, MD   2 puff at 02/29/16 0735  . montelukast (SINGULAIR) tablet 10 mg  10 mg Oral QHS Gennaro Africa, MD   10 mg at 02/28/16 2135  . pantoprazole (PROTONIX) EC tablet 40 mg  40 mg Oral Daily  Gennaro Africa, MD   40 mg at 02/29/16 [phone]  . potassium chloride SA (K-DUR,KLOR-CON) CR tablet 40 mEq  40 mEq Oral Daily Gennaro Africa, MD   40 mEq at 02/29/16 0939  . sodium chloride flush (NS) 0.9 % injection 3 mL  3 mL Intravenous Q12H Gennaro Africa, MD   3 mL at 02/29/16 1000  . sodium chloride flush (NS) 0.9 % injection 3 mL  3 mL Intravenous PRN Gennaro Africa, MD      . tiotropium [facility]) inhalation capsule 18 mcg  18 mcg Inhalation Daily Gennaro Africa, MD   18 mcg at 02/29/16 0735  . traZODone (DESYREL) tablet 25 mg  25 mg Oral QHS Gennaro Africa, MD   25 mg at 02/28/16 2135     Discharge Medications: Please see discharge summary for a list of discharge medications.  Relevant Imaging Results:  Relevant Lab Results:   Additional Information SS#: [id number]  Candie Chroman, LCSW

## 2016-03-01 DIAGNOSIS — I472 Ventricular tachycardia: Secondary | ICD-10-CM

## 2016-03-01 DIAGNOSIS — K5900 Constipation, unspecified: Secondary | ICD-10-CM

## 2016-03-01 DIAGNOSIS — I4729 Other ventricular tachycardia: Secondary | ICD-10-CM | POA: Insufficient documentation

## 2016-03-01 LAB — GLUCOSE, CAPILLARY
GLUCOSE-CAPILLARY: 146 mg/dL — AB (ref 65–99)
GLUCOSE-CAPILLARY: 190 mg/dL — AB (ref 65–99)
Glucose-Capillary: 170 mg/dL — ABNORMAL HIGH (ref 65–99)
Glucose-Capillary: 189 mg/dL — ABNORMAL HIGH (ref 65–99)

## 2016-03-01 LAB — CBC
HCT: 35.5 % — ABNORMAL LOW (ref 39.0–52.0)
Hemoglobin: 11.1 g/dL — ABNORMAL LOW (ref 13.0–17.0)
MCH: 27.7 pg (ref 26.0–34.0)
MCHC: 31.3 g/dL (ref 30.0–36.0)
MCV: 88.5 fL (ref 78.0–100.0)
PLATELETS: 174 10*3/uL (ref 150–400)
RBC: 4.01 MIL/uL — ABNORMAL LOW (ref 4.22–5.81)
RDW: 18.1 % — AB (ref 11.5–15.5)
WBC: 9 10*3/uL (ref 4.0–10.5)

## 2016-03-01 LAB — BASIC METABOLIC PANEL
Anion gap: 8 (ref 5–15)
BUN: 45 mg/dL — AB (ref 6–20)
CALCIUM: 8.9 mg/dL (ref 8.9–10.3)
CO2: 31 mmol/L (ref 22–32)
CREATININE: 1.36 mg/dL — AB (ref 0.61–1.24)
Chloride: 98 mmol/L — ABNORMAL LOW (ref 101–111)
GFR calc Af Amer: 54 mL/min — ABNORMAL LOW (ref >60)
GFR, EST NON AFRICAN AMERICAN: 46 mL/min — AB (ref >60)
GLUCOSE: 153 mg/dL — AB (ref 65–99)
Potassium: 4.2 mmol/L (ref 3.5–5.1)
Sodium: 137 mmol/L (ref 135–145)

## 2016-03-01 LAB — MAGNESIUM: Magnesium: 2.3 mg/dL (ref 1.7–2.4)

## 2016-03-01 MED ORDER — POLYETHYLENE GLYCOL 3350 17 G PO PACK
17.0000 g | PACK | Freq: Every day | ORAL | Status: DC
  Administered 2016-03-02 – 2016-03-04 (×3): 17 g via ORAL
  Filled 2016-03-01 (×3): qty 1

## 2016-03-01 MED ORDER — METOLAZONE 2.5 MG PO TABS
2.5000 mg | ORAL_TABLET | Freq: Once | ORAL | Status: AC
  Administered 2016-03-01: 2.5 mg via ORAL
  Filled 2016-03-01: qty 1

## 2016-03-01 MED ORDER — BISACODYL 10 MG RE SUPP
10.0000 mg | RECTAL | Status: AC
  Administered 2016-03-01: 10 mg via RECTAL
  Filled 2016-03-01: qty 1

## 2016-03-01 MED ORDER — POTASSIUM CHLORIDE CRYS ER 20 MEQ PO TBCR
20.0000 meq | EXTENDED_RELEASE_TABLET | Freq: Once | ORAL | Status: AC
  Administered 2016-03-01: 20 meq via ORAL
  Filled 2016-03-01: qty 1

## 2016-03-01 MED ORDER — FUROSEMIDE 10 MG/ML IJ SOLN
80.0000 mg | Freq: Two times a day (BID) | INTRAMUSCULAR | Status: AC
  Administered 2016-03-01 – 2016-03-02 (×3): 80 mg via INTRAVENOUS
  Filled 2016-03-01 (×3): qty 8

## 2016-03-01 MED ORDER — SENNOSIDES-DOCUSATE SODIUM 8.6-50 MG PO TABS
1.0000 | ORAL_TABLET | Freq: Two times a day (BID) | ORAL | Status: DC
Start: 2016-03-01 — End: 2016-03-04
  Administered 2016-03-02 – 2016-03-04 (×5): 1 via ORAL
  Filled 2016-03-01 (×5): qty 1

## 2016-03-01 NOTE — Progress Notes (Signed)
Patient ID: Christopher Holder, male   DOB: 07/24/1930, 82 y.o.   MRN: [id number]    PROGRESS NOTE    Christopher Holder  [id number] DOB: [birth year] DOA: 02/26/2016  PCP: Mackie Pai, PA-C   Brief Narrative:  Patient is a 82 yo male with known chronic combined CHF and pulmonary fibrosis, EF 20-25%, presented to [facility] ED with main concern of progressive dyspnea with exertion and at rest.   Assessment & Plan:  Acute hypoxic respiratory failure due to acute on chronic combined CHF, pulmonary fibrosis, AECOPD, acute LLL and RUL CAP, unknown pathogen  - 2D echo EF 20-25% in the past month so no need to repeat new ECHO - pt still significantly volume overloaded  - lasix dose increased from 60 mg IV BID to 80 mg IV BID to see if this will help - weight 120 kg --> 121 kg this AM, unchanged in the past 48 hours  - monitor daily weights, strict I/O - consult cardiology for assistance   LLL and RUL PNA, CAP - continue with doxycycline day #5/7, BD as needed - follow up on strep pneumo, sputum cultures, negative to date   AECOPD, pulmonary fibrosis - was on solumedrol and transitioned to Prednisone tapering  - allow BD scheduled and as needed   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  - hold home glipizide, SSI while inpatient   CKD stage III  - review of records indicate GFR in 40's with baseline Cr ~ 1.4 - Cr trend: 1.3 --> 1.54 --> 1.48 --> 1.36 - BMP in AM  PAF, CHADS2 score 5 - continue Coreg, Eliquis - rate controlled for now  - keep on telemetry   Hypokalemia due to lasix use - supplemented and WNL this AM   HTN, essential  - continue home medications   CAD - stable no chest pain, continue current regimen - trop elevated, flat   Morbid obesity due to excess calories - pt meets criteria with BMI > 35 and underlying risks of HTN, DM - Body mass index is 36.01 kg/(m^2)  DVT prophylaxis: Eliquis Code Status: Full Family Communication: Patient at bedside    Disposition Plan: SNF when more diuresed   Consultants:   Cardiology   Procedures:   None  Antimicrobials:   Doxycycline 6/9 -->   Subjective: Reports feeling better but still with dyspnea and LE edema.   Objective: Filed Vitals:   03/01/16 0431 03/01/16 0754 03/01/16 1125 03/01/16 1434  BP: 112/63  95/56   Pulse: 90  86   Temp: 97.7 F (36.5 C)  97.9 F (36.6 C)   TempSrc: Oral  Oral   Resp: 20  20   Height:      Weight: 121.7 kg (268 lb 4.8 oz)     SpO2: 100% 98% 97% 98%    Intake/Output Summary (Last 24 hours) at 03/01/16 1746 Last data filed at 03/01/16 1708  Gross per 24 hour  Intake   1200 ml  Output   3226 ml  Net  -2026 ml   Filed Weights   02/28/16 0539 02/29/16 0617 03/01/16 0431  Weight: 120.203 kg (265 lb) 121.11 kg (267 lb) 121.7 kg (268 lb 4.8 oz)    Examination:  General exam: Appears calm and comfortable if sitting in chair, dyspnea with standing and even few steps walking  Respiratory system: rales bilaterally with minimal wheezing,  Cardiovascular system:  IRRR. No rubs, gallops or clicks. Bilateral LE edema +2 Gastrointestinal system: Abdomen is nondistended, soft  and nontender.  Central nervous system: Alert and oriented. No focal neurological deficits. Extremities: Symmetric 5 x 5 power. + 2 bilateral LE pitting edema, chronic venous stasis changes    Data Reviewed: I have personally reviewed following labs and imaging studies  CBC:  Recent Labs Lab 02/26/16 1654 02/28/16 0358 02/29/16 0350 03/01/16 0301  WBC 7.4 5.6 10.1 9.0  HGB 12.0* 11.5* 11.3* 11.1*  HCT 37.8* 37.1* 36.3* 35.5*  MCV 87.5 89.0 88.5 88.5  PLT 191 191 186 [id number]   Basic Metabolic Panel:  Recent Labs Lab 02/26/16 1654 02/27/16 0227 02/28/16 0358 02/29/16 0350 03/01/16 0301 03/01/16 1505  NA 135 139 138 138 137  --   K 4.1 3.3* 4.0 4.0 4.2  --   CL 100* 100* 100* 98* 98*  --   CO2 26 30 30 31 31   --   GLUCOSE 128* 133* 172* 189* 153*  --   BUN  22* 22* 29* 37* 45*  --   CREATININE 1.38* 1.33* 1.54* 1.48* 1.36*  --   CALCIUM 9.1 8.8* 9.0 8.9 8.9  --   MG  --   --   --   --   --  2.3   Liver Function Tests:  Recent Labs Lab 02/27/16 0227  AST 24  ALT 25  ALKPHOS 90  BILITOT 0.9  PROT 6.0*  ALBUMIN 3.1*   Cardiac Enzymes:  Recent Labs Lab 02/26/16 2231 02/27/16 0227 02/27/16 1553 02/27/16 2126 02/28/16 0358  TROPONINI 0.03 0.04* 0.06* 0.03 0.03   BNP (last 3 results)  Recent Labs  10/06/15 1153 10/21/15 1106 01/05/16 1148  PROBNP 474.0* 318.0* 538.0*   CBG:  Recent Labs Lab 02/29/16 1635 02/29/16 2037 03/01/16 0636 03/01/16 1113 03/01/16 1633  GLUCAP 184* 196* 146* 170* 190*   Urine analysis:    Component Value Date/Time   COLORURINE YELLOW 12/16/2015 0011   APPEARANCEUR CLEAR 12/16/2015 0011   LABSPEC 1.013 12/16/2015 0011   PHURINE 6.0 12/16/2015 0011   GLUCOSEU NEGATIVE 12/16/2015 0011   HGBUR NEGATIVE 12/16/2015 0011   BILIRUBINUR NEGATIVE 12/16/2015 0011   KETONESUR NEGATIVE 12/16/2015 0011   PROTEINUR NEGATIVE 12/16/2015 0011   NITRITE NEGATIVE 12/16/2015 0011   LEUKOCYTESUR NEGATIVE 12/16/2015 0011     Radiology Studies: Dg Chest 2 View 02/26/2016  Cardiomegaly and edema. Focal opacity in the right lateral lung.   Ct Chest Wo Contrast 02/27/2016   Again noted chronic interstitial coarsening bilaterally. Peripheral reticulation and architectural distortion consistent with fibrotic changes again noted. Findings consistent with pulmonary fibrosis. There is patchy superimposed peripheral ground-glass attenuation in right upper lobe. There is patchy airspace disease in left lower lobe. Findings highly suspicious for superimposed infiltrate/ pneumonia. Follow-up to resolution after treatment is recommended. 2. Stable mediastinal adenopathy. 3. Small dependent secretions are noted in mid trachea without evidence of airway obstruction. 4. There is small to moderate right pleural effusion. Small  left pleural effusion. 5. Degenerative changes thoracolumbar spine.   Scheduled Meds: . allopurinol  100 mg Oral BID  . apixaban  2.5 mg Oral BID  . aspirin EC  81 mg Oral Daily  . atorvastatin  10 mg Oral QHS  . carvedilol  3.125 mg Oral BID WC  . doxycycline  100 mg Oral Q12H  . fluticasone  2 spray Each Nare Daily  . furosemide  80 mg Intravenous BID  . guaiFENesin  600 mg Oral BID  . insulin aspart  0-9 Units Subcutaneous TID WC  . ipratropium-albuterol  3 mL  Nebulization TID  . losartan  25 mg Oral Daily  . mometasone-formoterol  2 puff Inhalation BID  . montelukast  10 mg Oral QHS  . pantoprazole  40 mg Oral Daily  . potassium chloride  40 mEq Oral Daily  . predniSONE  50 mg Oral Q breakfast  . sodium chloride flush  3 mL Intravenous Q12H  . tiotropium  18 mcg Inhalation Daily  . traZODone  25 mg Oral QHS   Continuous Infusions:  Time spent: 20 minutes   Faye Ramsay, MD [facility] Pager [phone]  If 7PM-7AM, please contact night-coverage [email or web address] Password [facility] 03/01/2016, 5:46 PM

## 2016-03-01 NOTE — Progress Notes (Signed)
Physical Therapy Treatment Patient Details Name: Christopher Holder MRN: [id number] DOB: [birth year] Today's Date: 03/01/2016    History of Present Illness Patient is a 82 yo male with known chronic combined CHF and pulmonary fibrosis, EF 20-25%, presented to [facility] ED with main concern of progressive dyspnea with exertion and at rest. Dx: acute hyposic respiratory failure due to acute on chronic combined  CHF, pulmonary fibrosis, AECOPD, acute LLL and RUL CAP, unknown pathogen.    PT Comments    Christopher Holder made good functional progress today, ambulating in hallway.  However, pt required one standing rest break in which he needed to lean against railing in hallway due to fatigue.  SpO2 dropped as low as 80% on 2L O2 which improved to mid 90s w/ 3L O2 and rest break.  SNF remains most appropriate d/c plan as pt will need supervision/assist when OOB at d/c.  Unlikely that [place] can provide this level of assist.   Follow Up Recommendations  SNF;Supervision/Assistance - 24 hour     Equipment Recommendations  None recommended by PT    Recommendations for Other Services       Precautions / Restrictions Precautions Precautions: Fall Precaution Comments: monitor HR and O2 Restrictions Weight Bearing Restrictions: No    Mobility  Bed Mobility               General bed mobility comments: Pt sitting in recliner chair upon PT arrival  Transfers Overall transfer level: Needs assistance Equipment used: Rolling walker (2 wheeled) Transfers: Sit to/from Stand Sit to Stand: Min guard         General transfer comment: Pt requires increased time and is slow to stand. Cues for hand placement to sit and to back up all the way to the chair before sitting.  Pt attempts to sit prematurely.  Ambulation/Gait Ambulation/Gait assistance: Min guard Ambulation Distance (Feet): 90 Feet Assistive device: Rolling walker (2 wheeled) Gait Pattern/deviations: Step-through pattern;Decreased  stride length;Trunk flexed   Gait velocity interpretation: Below normal speed for age/gender General Gait Details: HR up to 133.  Cues for upright posture and forward gaze.  Pt requires 1 rest break in which he leans against railing on wall due to fatigue.  SpO2 drops as low as 80% on 2L O2 which improves to mid 90s w/ 3L O2 and rest break.     Stairs            Wheelchair Mobility    Modified Rankin (Stroke Patients Only)       Balance Overall balance assessment: Needs assistance Sitting-balance support: No upper extremity supported;Feet supported Sitting balance-Leahy Scale: Good     Standing balance support: Bilateral upper extremity supported;During functional activity Standing balance-Leahy Scale: Poor Standing balance comment: Relies on UE support                    Cognition Arousal/Alertness: Awake/alert Behavior During Therapy: WFL for tasks assessed/performed Overall Cognitive Status: Within Functional Limits for tasks assessed                      Exercises General Exercises - Lower Extremity Ankle Circles/Pumps: AROM;Both;10 reps;Seated Quad Sets: Strengthening;Both;10 reps;Seated Long Arc Quad: AROM;Both;10 reps;Seated Hip Flexion/Marching: Both;10 reps;Seated    General Comments General comments (skin integrity, edema, etc.): SNF remains most appropriate d/c plan as pt will need supervision/assist when OOB at d/c.  Unlikely that [place] can provide this level of assist.  Pertinent Vitals/Pain Pain Assessment: No/denies pain    Home Living                      Prior Function            PT Goals (current goals can now be found in the care plan section) Acute Rehab PT Goals Patient Stated Goal: to get stronger PT Goal Formulation: With patient Time For Goal Achievement: 03/13/16 Potential to Achieve Goals: Good Progress towards PT goals: Progressing toward goals    Frequency  Min 2X/week     PT Plan Current plan remains appropriate    Co-evaluation             End of Session Equipment Utilized During Treatment: Gait belt;Oxygen Activity Tolerance: Patient limited by fatigue;Treatment limited secondary to medical complications (Comment) (hypoxia) Patient left: in chair;with call bell/phone within reach;with chair alarm set     Time: [id number] PT Time Calculation (min) (ACUTE ONLY): 18 min  Charges:  $Gait Training: 8-22 mins                    G Codes:      Collie Siad PT, DPT  Pager: [phone] Phone: [phone] 03/01/2016, 2:19 PM

## 2016-03-01 NOTE — Progress Notes (Signed)
Daily Progress Note   Patient Name: Christopher Holder       Date: 03/01/2016 DOB: Jul 19, 1930  Age: 82 y.o. MRN#: [id number] Attending Physician: Theodis Blaze, MD Primary Care Physician: Elise Benne Admit Date: 02/26/2016  Reason for Consultation/Follow-up: Establishing goals of care, Non pain symptom management and Psychosocial/spiritual support  Subjective:  - patient c/o constipation and increased "swelling" in LE  -long conversation with daughter as she makes disposition decisions   Length of Stay: 3  Current Medications: Scheduled Meds:  . allopurinol  100 mg Oral BID  . apixaban  2.5 mg Oral BID  . aspirin EC  81 mg Oral Daily  . atorvastatin  10 mg Oral QHS  . carvedilol  3.125 mg Oral BID WC  . doxycycline  100 mg Oral Q12H  . fluticasone  2 spray Each Nare Daily  . furosemide  80 mg Intravenous BID  . guaiFENesin  600 mg Oral BID  . insulin aspart  0-9 Units Subcutaneous TID WC  . ipratropium-albuterol  3 mL Nebulization TID  . losartan  25 mg Oral Daily  . mometasone-formoterol  2 puff Inhalation BID  . montelukast  10 mg Oral QHS  . pantoprazole  40 mg Oral Daily  . polyethylene glycol  17 g Oral Daily  . potassium chloride  40 mEq Oral Daily  . predniSONE  50 mg Oral Q breakfast  . senna-docusate  1 tablet Oral BID  . sodium chloride flush  3 mL Intravenous Q12H  . tiotropium  18 mcg Inhalation Daily  . traZODone  25 mg Oral QHS    Continuous Infusions:    PRN Meds: sodium chloride, guaiFENesin, levalbuterol, sodium chloride flush  Physical Exam  Constitutional: He is oriented to person, place, and time. He appears well-developed. He appears ill.  Cardiovascular: Normal rate, regular rhythm and normal heart sounds.   Pulmonary/Chest: He has decreased  breath sounds in the right lower field and the left lower field.  Musculoskeletal:  -BLE +2 edema  Neurological: He is alert and oriented to person, place, and time.  Skin: Skin is warm and dry.            Vital Signs: BP 95/56 mmHg  Pulse 86  Temp(Src) 97.9 F (36.6 C) (Oral)  Resp 20  Ht 6' (1.829 m)  Wt 121.7 kg (268 lb 4.8 oz)  BMI 36.38 kg/m2  SpO2 98% SpO2: SpO2: 98 % O2 Device: O2 Device: Nasal Cannula O2 Flow Rate: O2 Flow Rate (L/min): 2 L/min  Intake/output summary:  Intake/Output Summary (Last 24 hours) at 03/01/16 1844 Last data filed at 03/01/16 1828  Gross per 24 hour  Intake   1182 ml  Output   3577 ml  Net  -2395 ml   LBM: Last BM Date: 02/29/16 Baseline Weight: Weight: 122.074 kg (269 lb 2 oz) Most recent weight: Weight: 121.7 kg (268 lb 4.8 oz) (scale a)       Palliative Assessment/Data: 30%    Flowsheet Rows        Most Recent Value   Intake Tab    Referral Department  Hospitalist   Unit at Time of Referral  Cardiac/Telemetry Unit   Palliative Care Primary Diagnosis  Pulmonary   Date Notified  02/27/16   Palliative Care Type  New Palliative care   Reason for referral  Clarify Goals of Care   Date of Admission  02/26/16   Date first seen by Palliative Care  02/28/16   # of days Palliative referral response time  1 Day(s)   # of days IP prior to Palliative referral  1   Clinical Assessment    Psychosocial & Spiritual Assessment    Palliative Care Outcomes       Patient Active Problem List   Diagnosis Date Noted  . Palliative care encounter 02/28/2016  . DNR (do not resuscitate) discussion 02/28/2016  . Dyspnea   . Pulmonary fibrosis ([place])   . Acute on chronic systolic congestive heart failure ([place])   . Chronic obstructive pulmonary disease ([place])   . Heart failure ([place]) 02/26/2016  . Chronic systolic CHF (congestive heart failure), NYHA class 4 ([place]) 02/02/2016  . Pulmonary fibrosis, postinflammatory ([place]) 02/02/2016  . DOE (dyspnea  on exertion) 02/02/2016  . Asthma 01/15/2016  . CKD (chronic kidney disease), stage III 12/29/2015  . Renal failure (ARF), acute on chronic (HCC) 12/16/2015  . Hypoxia 12/15/2015  . Pain in lower limb 10/28/2015  . NICM (nonischemic cardiomyopathy) ([place]) 10/02/2015  . Abnormal nuclear stress test 10/01/2015  . Acute respiratory failure ([place])   . Community acquired pneumonia   . Cough with hemoptysis   . Sepsis ([place])   . Chronic systolic congestive heart failure ([place])   . Pulmonary infiltrate   . OSA on CPAP   . Hemoptysis 08/29/2015  . Persistent atrial fibrillation ([place]) 08/26/2015  . Severe sepsis ([place])   . ARDS (adult respiratory distress syndrome) ([place])   . Renal failure, acute on chronic (HCC)   . Acute systolic CHF (congestive heart failure) ([place])   . Acute respiratory failure with hypoxemia ([place])   . Systolic CHF, acute on chronic (HCC)   . Prolonged QT interval   . Sepsis due to pneumonia ([place]) 08/18/2015  . AKI (acute kidney injury) ([place]) 08/18/2015  . Metabolic encephalopathy [id number]  . Thrombocytopenia ([place]) 08/18/2015  . Acute respiratory failure with hypoxia ([place]) 08/18/2015  . Hard of hearing 08/18/2015  . Chronic sinus bradycardia 08/18/2015  . CAP (community acquired pneumonia) 08/18/2015  . Acute pulmonary edema ([place]) 08/18/2015  . Septic shock ([place]) 08/18/2015  . Chronic cough 08/07/2015  . Onychomycosis 07/28/2015  . Injury by nail 07/28/2015  . Coronary artery disease involving native coronary artery of native heart without angina pectoris 06/19/2015  . Obesity 06/19/2015  . Obstructive sleep apnea 06/19/2015  .  Other emphysema ([place]) 06/19/2015  . Diabetes mellitus type 2, controlled ([place]) 05/27/2015  . HTN (hypertension) 05/27/2015  . Hyperlipidemia 05/27/2015  . GERD (gastroesophageal reflux disease) 05/27/2015  . Allergic rhinitis 05/27/2015  . Diabetic neuropathy ([place]) 05/27/2015  . CHF (congestive heart failure) ([place]) 05/27/2015     Palliative Care Assessment & Plan   Patient Profile: 82 y.o. male admitted on 02/26/2016 with PMH of HTN, systolic HF, pulmonary fibrosis, DMII, admitted with cc of dyspnea.  Assessment: Continued physical and functional decline, multiple hospitatilizations. Faced with advanced directive decisions and anticipatory care needs   Goals of Care and Additional Recommendations:  -focus of care is comfort with the hope of continued quality of life -medical management  Of chronic disease, Cardiology to see for increased edema -dulcolax supp NOW  Code Status:  Encouraged him to consider DNR knowing poor outcomes in similar patients     Code Status Orders        Start     Ordered   02/26/16 2144  Full code   Continuous     02/26/16 2145    Code Status History    Date Active Date Inactive Code Status Order ID Comments User Context   02/02/2016  8:25 PM 02/05/2016  2:19 PM Full Code [id number]  Etta Quill, DO ED   12/16/2015  3:47 AM 12/19/2015  7:47 PM Full Code [id number]  Rise Patience, MD Inpatient   10/02/2015 10:46 AM 10/02/2015  5:41 PM Full Code [id number]  Leonie Man, MD Inpatient   08/18/2015 12:48 PM 09/02/2015  7:31 PM Full Code [id number]  Samella Parr, NP Inpatient    Advance Directive Documentation        Most Recent Value   Type of Advance Directive  Healthcare Power of Attorney   Pre-existing out of facility DNR order (yellow form or pink MOST form)     "MOST" Form in Place?         Prognosis:   < 6 months  Discharge Planning:     Daughter is struggling with decision regarding disposition AL with hospice vs SNF for rehabilitation.  She understands the overall poor prognosis and unrealistic goal of significant improvemtn at a rehabilitation center  Care plan was discussed with Dr Doyle Askew  Thank you for allowing the Palliative Medicine Team to assist in the care of this patient.   Time In: 1000 Time Out: 1035 Total Time 35 min Prolonged Time  Billed  no       Greater than 50%  of this time was spent counseling and coordinating care related to the above assessment and plan.  Wadie Lessen, NP  Please contact Palliative Medicine Team phone at [phone] for questions and concerns.

## 2016-03-01 NOTE — Progress Notes (Signed)
Advanced Heart Failure Rounding Note  Referring Physician: Dr. Doyle Askew   PCP: Mackie Pai, PA-C  Primary Cardiologist: Dr. Marlou Porch HF: Dr. Haroldine Laws EP: Dr. Rayann Heman  Subjective:    Admitted 02/26/16 with 2 weeks of DOE + edema  Still with coughing and dyspnea with conversation. Says he is peeing "OK". Urine remains clear.  Deconditioned, PT recommend SNF as of 02/28/16.  Out only 500 cc yesterday and weight shows up 1 lb on lasix 60 mg IV BID. Creatinine trending down and BUN trending up.   Objective:   Weight Range: 268 lb 4.8 oz (121.7 kg) Body mass index is 36.38 kg/(m^2).   Vital Signs:   Temp:  [97.7 F (36.5 C)-98.4 F (36.9 C)] 97.7 F (36.5 C) (06/13 0431) Pulse Rate:  [86-130] 90 (06/13 0431) Resp:  [18-24] 20 (06/13 0431) BP: (93-112)/(55-85) 112/63 mmHg (06/13 0431) SpO2:  [98 %-100 %] 100 % (06/13 0431) Weight:  [268 lb 4.8 oz (121.7 kg)] 268 lb 4.8 oz (121.7 kg) (06/13 0431) Last BM Date: 02/29/16  Weight change: Filed Weights   02/28/16 0539 02/29/16 0617 03/01/16 0431  Weight: 265 lb (120.203 kg) 267 lb (121.11 kg) 268 lb 4.8 oz (121.7 kg)    Intake/Output:   Intake/Output Summary (Last 24 hours) at 03/01/16 0742 Last data filed at 03/01/16 0443  Gross per 24 hour  Intake   1320 ml  Output   1790 ml  Net   -470 ml     Physical Exam: General: Elderly appearing. Increased work of breathing with conversation  HEENT: normal Neck: supple. JVP to jaw . Carotids 2+ bilaterally; no bruits. No thyromegaly or nodule noted.  Cor: PMI normal. Irregular rate & rhythm. No rubs, gallops or murmurs appreciated Lungs: Decreased throughout with scattered rhonchi and basilar crackles. Scattered wheezes, R>L. Abdomen: obese, soft, nontender, nondistended. No hepatosplenomegaly. No bruits or masses. Good bowel sounds. Extremities: no cyanosis, clubbing, rash, 1+ edema Neuro: alert & orientedx3, cranial nerves grossly intact. Moves all 4 extremities w/o  difficulty. Affect pleasant.  Telemetry: reviewed personally, Afib 100s  Labs: CBC  Recent Labs  02/29/16 0350 03/01/16 0301  WBC 10.1 9.0  HGB 11.3* 11.1*  HCT 36.3* 35.5*  MCV 88.5 88.5  PLT 186 [id number]   Basic Metabolic Panel  Recent Labs  02/29/16 0350 03/01/16 0301  NA 138 137  K 4.0 4.2  CL 98* 98*  CO2 31 31  GLUCOSE 189* 153*  BUN 37* 45*  CREATININE 1.48* 1.36*  CALCIUM 8.9 8.9   Liver Function Tests No results for input(s): AST, ALT, ALKPHOS, BILITOT, PROT, ALBUMIN in the last 72 hours. No results for input(s): LIPASE, AMYLASE in the last 72 hours. Cardiac Enzymes  Recent Labs  02/27/16 1553 02/27/16 2126 02/28/16 0358  TROPONINI 0.06* 0.03 0.03    BNP: BNP (last 3 results)  Recent Labs  12/15/15 2100 02/02/16 1447 02/26/16 1654  BNP 481.6* 604.4* 999.3*    ProBNP (last 3 results)  Recent Labs  10/06/15 1153 10/21/15 1106 01/05/16 1148  PROBNP 474.0* 318.0* 538.0*     D-Dimer No results for input(s): DDIMER in the last 72 hours. Hemoglobin A1C No results for input(s): HGBA1C in the last 72 hours. Fasting Lipid Panel No results for input(s): CHOL, HDL, LDLCALC, TRIG, CHOLHDL, LDLDIRECT in the last 72 hours. Thyroid Function Tests No results for input(s): TSH, T4TOTAL, T3FREE, THYROIDAB in the last 72 hours.  Invalid input(s): FREET3  Other results:     Imaging/Studies:  No results found.  Latest Echo  Latest Cath   Medications:     Scheduled Medications: . allopurinol  100 mg Oral BID  . apixaban  2.5 mg Oral BID  . aspirin EC  81 mg Oral Daily  . atorvastatin  10 mg Oral QHS  . carvedilol  3.125 mg Oral BID WC  . doxycycline  100 mg Oral Q12H  . fluticasone  2 spray Each Nare Daily  . furosemide  60 mg Intravenous BID  . guaiFENesin  600 mg Oral BID  . insulin aspart  0-9 Units Subcutaneous TID WC  . ipratropium-albuterol  3 mL Nebulization TID  . losartan  25 mg Oral Daily  . mometasone-formoterol  2  puff Inhalation BID  . montelukast  10 mg Oral QHS  . pantoprazole  40 mg Oral Daily  . potassium chloride  40 mEq Oral Daily  . predniSONE  50 mg Oral Q breakfast  . sodium chloride flush  3 mL Intravenous Q12H  . tiotropium  18 mcg Inhalation Daily  . traZODone  25 mg Oral QHS     Infusions:     PRN Medications:  sodium chloride, guaiFENesin, levalbuterol, sodium chloride flush   Assessment   1. Dyspnea - multifactorial 2. A/C systolic HF 3. Atrial fibriliation, permanent 4. CAD s/p D1 stent - Patent LHC 2017 5. HTN 6. HLD 7. CKD stage III 8. COPD 9. OSA with CPAP 10. Pulmonary fibrosis  Plan    Dyspnea multifactorial and we are attempting to control volume overload.    He remains markedly volume overloaded and only had modest response to lasix yesterday.  Will increase to 80 mg IV lasix BID and give one dose 2.5 mg metolazone this am. Supp K.    Some wheezing on exam, RT arrived in room to give breathing treatment towards the end of my exam.  CAD stable without ACS. Continue nightly CPAP for OSA.   Currently planned for d/c Wednesday to [facility] with ? Hospice services, although pt is still full code at this time.   Length of Stay: 3   Shirley Friar PA-C 03/01/2016, 7:42 AM  Advanced Heart Failure Team Pager [phone] (M-F; 7a - 4p)  Please contact [facility] for night-coverage after hours (4p -7a ) and weekends on [email or web address]   Addendum: Paged for 10 beats NSVT.  K stable, will order Mg. Asymptomatic.  Legrand Como [address] [place], PA-C 03/01/2016 1:04 PM   Patient seen and examined with Oda Kilts, PA-C. We discussed all aspects of the encounter. I agree with the assessment and plan as stated above.   Little progress with IV diuresis. Agree with increasing lasix to 80 bid and adding metolazone.For NSVT keep K >= 4.0 Mg >= 2.0.   Options very limited.   Bensimhon, Daniel,MD 8:44 PM

## 2016-03-01 NOTE — Clinical Social Work Note (Addendum)
CSW called [facility] to make sure they still had a room available for patient. Spoke with admissions coordinator and she said that they still needed to assess him and either she or another staff member would come to the hospital today to do that. CSW called patient's daughter. Patient's daughter still unsure whether to do SNF or ALF with hospice. PT evaluation says that patient requires only minimal guard and is walking 40 feet with a walker. Patient's daughter asked CSW to contact PT and find out if he could get adequate treatment at ALF with how he is doing now. CSW will also assist patient's daughter by calling patient's insurance company and finding out what they will pay for. Patient's daughter dealing with a lot of social stressors right now.  Dayton Scrape, CSW [phone]  12:01 pm Spoke with Collie Siad, PT that gave eval on 6/11 about whether she recommended patient going to SNF or if she thought he would be okay at ALF with hospice. She stated that she would feel better if patient had someone to be with him when out of bed.  Dayton Scrape, CSW [phone]  1:35 pm CSW contacted insurance company and they said that patient is in day 30 of SNF days. Days 1-20 are $0 copay, Days 21-51 are $160 copay, and days 52-100 are $0 copay. If he goes to ALF with hospice, regular Medicare will take over. ALF will be private pay. Patient and his daughter have already applied for Medicaid for long-term care.  Dayton Scrape, [place]

## 2016-03-02 DIAGNOSIS — K5900 Constipation, unspecified: Secondary | ICD-10-CM | POA: Insufficient documentation

## 2016-03-02 LAB — GLUCOSE, CAPILLARY
GLUCOSE-CAPILLARY: 145 mg/dL — AB (ref 65–99)
Glucose-Capillary: 206 mg/dL — ABNORMAL HIGH (ref 65–99)
Glucose-Capillary: 184 mg/dL — ABNORMAL HIGH (ref 65–99)
Glucose-Capillary: 179 mg/dL — ABNORMAL HIGH (ref 65–99)

## 2016-03-02 LAB — CULTURE, RESPIRATORY: CULTURE: NORMAL

## 2016-03-02 LAB — CBC
HEMATOCRIT: 37.5 % — AB (ref 39.0–52.0)
HEMOGLOBIN: 11.8 g/dL — AB (ref 13.0–17.0)
MCH: 27.6 pg (ref 26.0–34.0)
MCHC: 31.5 g/dL (ref 30.0–36.0)
MCV: 87.6 fL (ref 78.0–100.0)
Platelets: 187 10*3/uL (ref 150–400)
RBC: 4.28 MIL/uL (ref 4.22–5.81)
RDW: 18.1 % — ABNORMAL HIGH (ref 11.5–15.5)
WBC: 8.4 10*3/uL (ref 4.0–10.5)

## 2016-03-02 LAB — BASIC METABOLIC PANEL
ANION GAP: 10 (ref 5–15)
BUN: 49 mg/dL — AB (ref 6–20)
CO2: 36 mmol/L — AB (ref 22–32)
Calcium: 9.2 mg/dL (ref 8.9–10.3)
Chloride: 92 mmol/L — ABNORMAL LOW (ref 101–111)
Creatinine, Ser: 1.49 mg/dL — ABNORMAL HIGH (ref 0.61–1.24)
GFR calc non Af Amer: 42 mL/min — ABNORMAL LOW (ref >60)
GFR, EST AFRICAN AMERICAN: 48 mL/min — AB (ref >60)
GLUCOSE: 158 mg/dL — AB (ref 65–99)
Potassium: 3.7 mmol/L (ref 3.5–5.1)
Sodium: 138 mmol/L (ref 135–145)

## 2016-03-02 LAB — CULTURE, RESPIRATORY W GRAM STAIN

## 2016-03-02 MED ORDER — POTASSIUM CHLORIDE CRYS ER 20 MEQ PO TBCR
20.0000 meq | EXTENDED_RELEASE_TABLET | Freq: Once | ORAL | Status: AC
  Administered 2016-03-02: 20 meq via ORAL
  Filled 2016-03-02: qty 1

## 2016-03-02 MED ORDER — TORSEMIDE 20 MG PO TABS: 40.0000 mg | ORAL_TABLET | Freq: Two times a day (BID) | ORAL | Status: DC

## 2016-03-02 MED ORDER — TORSEMIDE 20 MG PO TABS
40.0000 mg | ORAL_TABLET | Freq: Two times a day (BID) | ORAL | Status: DC
  Administered 2016-03-02 – 2016-03-03 (×2): 40 mg via ORAL
  Filled 2016-03-02 (×2): qty 2

## 2016-03-02 MED ORDER — IPRATROPIUM-ALBUTEROL 0.5-2.5 (3) MG/3ML IN SOLN: 3.0000 mL | RESPIRATORY_TRACT | Status: DC | PRN

## 2016-03-02 MED ORDER — FUROSEMIDE 10 MG/ML IJ SOLN
80.0000 mg | Freq: Once | INTRAMUSCULAR | Status: AC
  Administered 2016-03-02: 80 mg via INTRAVENOUS
  Filled 2016-03-02: qty 8

## 2016-03-02 MED ORDER — ALBUTEROL SULFATE (2.5 MG/3ML) 0.083% IN NEBU
2.5000 mg | INHALATION_SOLUTION | Freq: Three times a day (TID) | RESPIRATORY_TRACT | Status: DC
  Administered 2016-03-02: 2.5 mg via RESPIRATORY_TRACT
  Filled 2016-03-02: qty 3

## 2016-03-02 NOTE — Progress Notes (Addendum)
Advanced Heart Failure Rounding Note  Referring Physician: Dr. Doyle Askew   PCP: Mackie Pai, PA-C  Primary Cardiologist: Dr. Marlou Porch HF: Dr. Haroldine Laws EP: Dr. Rayann Heman  Subjective:    Admitted 02/26/16 with 2 weeks of DOE + edema  Feeling better today, was up most of the night peeing.  Continues to have cough. Sputum slightly "brown" this morning per patient.    PT recommending SNF as of 02/28/16.  Much improved diuresis on increased lasix with metolazone. Out 3.2 L and down 9 lbs. Creatinine and BUN relatively stable.   Objective:   Weight Range: 259 lb 11.2 oz (117.799 kg) Body mass index is 35.21 kg/(m^2).   Vital Signs:   Temp:  [97.4 F (36.3 C)-97.9 F (36.6 C)] 97.8 F (36.6 C) (06/14 0435) Pulse Rate:  [86-99] 99 (06/14 0435) Resp:  [20] 20 (06/14 0435) BP: (93-95)/(56-70) 94/70 mmHg (06/14 0435) SpO2:  [97 %-100 %] 100 % (06/14 0435) Weight:  [259 lb 11.2 oz (117.799 kg)] 259 lb 11.2 oz (117.799 kg) (06/14 0435) Last BM Date: 03/01/16  Weight change: Filed Weights   02/29/16 0617 03/01/16 0431 03/02/16 0435  Weight: 267 lb (121.11 kg) 268 lb 4.8 oz (121.7 kg) 259 lb 11.2 oz (117.799 kg)    Intake/Output:   Intake/Output Summary (Last 24 hours) at 03/02/16 0725 Last data filed at 03/02/16 0600  Gross per 24 hour  Intake   1282 ml  Output   4827 ml  Net  -3545 ml     Physical Exam: General: Elderly appearing. Increased work of breathing with conversation  HEENT: normal Neck: supple. JVP 8-9. Carotids 2+ bilaterally; no bruits. No thyromegaly or nodule noted.  Cor: PMI normal. Irregular rate & rhythm. No M/G/R Lungs: Decreased throughout but improved from yesterday.  Abdomen: obese, soft, NT, ND, no HSM. No bruits or masses. +BS  Extremities: no cyanosis, clubbing, rash, trace-1+ edema Neuro: alert & orientedx3, cranial nerves grossly intact. Moves all 4 extremities w/o difficulty. Affect pleasant.  Telemetry: reviewed personally, Afib  90-100s  Labs: CBC  Recent Labs  03/01/16 0301 03/02/16 0329  WBC 9.0 8.4  HGB 11.1* 11.8*  HCT 35.5* 37.5*  MCV 88.5 87.6  PLT 174 [id number]   Basic Metabolic Panel  Recent Labs  03/01/16 0301 03/01/16 1505 03/02/16 0329  NA 137  --  138  K 4.2  --  3.7  CL 98*  --  92*  CO2 31  --  36*  GLUCOSE 153*  --  158*  BUN 45*  --  49*  CREATININE 1.36*  --  1.49*  CALCIUM 8.9  --  9.2  MG  --  2.3  --    Liver Function Tests No results for input(s): AST, ALT, ALKPHOS, BILITOT, PROT, ALBUMIN in the last 72 hours. No results for input(s): LIPASE, AMYLASE in the last 72 hours. Cardiac Enzymes No results for input(s): CKTOTAL, CKMB, CKMBINDEX, TROPONINI in the last 72 hours.  BNP: BNP (last 3 results)  Recent Labs  12/15/15 2100 02/02/16 1447 02/26/16 1654  BNP 481.6* 604.4* 999.3*    ProBNP (last 3 results)  Recent Labs  10/06/15 1153 10/21/15 1106 01/05/16 1148  PROBNP 474.0* 318.0* 538.0*     D-Dimer No results for input(s): DDIMER in the last 72 hours. Hemoglobin A1C No results for input(s): HGBA1C in the last 72 hours. Fasting Lipid Panel No results for input(s): CHOL, HDL, LDLCALC, TRIG, CHOLHDL, LDLDIRECT in the last 72 hours. Thyroid Function Tests No results  for input(s): TSH, T4TOTAL, T3FREE, THYROIDAB in the last 72 hours.  Invalid input(s): FREET3  Other results:     Imaging/Studies:  No results found.  Latest Echo  Latest Cath   Medications:     Scheduled Medications: . albuterol  2.5 mg Nebulization TID  . allopurinol  100 mg Oral BID  . apixaban  2.5 mg Oral BID  . aspirin EC  81 mg Oral Daily  . atorvastatin  10 mg Oral QHS  . carvedilol  3.125 mg Oral BID WC  . doxycycline  100 mg Oral Q12H  . fluticasone  2 spray Each Nare Daily  . furosemide  80 mg Intravenous BID  . guaiFENesin  600 mg Oral BID  . insulin aspart  0-9 Units Subcutaneous TID WC  . losartan  25 mg Oral Daily  . mometasone-formoterol  2 puff  Inhalation BID  . montelukast  10 mg Oral QHS  . pantoprazole  40 mg Oral Daily  . polyethylene glycol  17 g Oral Daily  . potassium chloride  40 mEq Oral Daily  . predniSONE  50 mg Oral Q breakfast  . senna-docusate  1 tablet Oral BID  . sodium chloride flush  3 mL Intravenous Q12H  . tiotropium  18 mcg Inhalation Daily  . traZODone  25 mg Oral QHS    Infusions:    PRN Medications: sodium chloride, guaiFENesin, levalbuterol, sodium chloride flush   Assessment   1. Dyspnea - multifactorial 2. A/C systolic HF 3. Atrial fibriliation, permanent 4. CAD s/p D1 stent - Patent LHC 2017 5. HTN 6. HLD 7. CKD stage III 8. COPD 9. OSA with CPAP 10. Pulmonary fibrosis  Plan    Dyspnea seems to have improved now with better diuresis.   Volume status much improved with increased diuretics. Will give dose of IV lasix this am, and then can resume po diuretics this evening.  Will switch from lasix to torsemide 40 mg BID.    Lungs much more clear than yesterday.   CAD stable without ACS. Continue nightly CPAP for OSA.   Currently planned for d/c today to SNF vs ALF with Hospice services. Pt remains full code at this time.    He has follow up for next week.   Length of Stay: 4  Shirley Friar PA-C 03/02/2016, 7:25 AM  Advanced Heart Failure Team Pager [phone] (M-F; 7a - 4p)  Please contact [facility] for night-coverage after hours (4p -7a ) and weekends on [email or web address]   Patient seen and examined with Oda Kilts, PA-C. We discussed all aspects of the encounter. I agree with the assessment and plan as stated above.   Much improved with diuresis. Weight down 10 pounds. Still with fluid on board. Give one more dose IV lasix. Supp K+. Hopefully d/c in am. I think he needs Paramedicine f/u until stable in community.  Bensimhon, Daniel,MD 8:09 PM

## 2016-03-02 NOTE — Progress Notes (Signed)
Patient ID: Christopher Holder, male   DOB: 12/15/1929, 82 y.o.   MRN: [id number]    PROGRESS NOTE    Christopher Holder  [id number] DOB: 1930/07/29 DOA: 02/26/2016  PCP: Mackie Pai, PA-C   Brief Narrative:  Patient is a 82 yo male with known chronic combined CHF and pulmonary fibrosis, EF 20-25%, presented to [facility] ED with main concern of progressive dyspnea with exertion and at rest.   Assessment & Plan:  Acute hypoxic respiratory failure due to acute on chronic combined CHF, pulmonary fibrosis, AECOPD, acute LLL and RUL CAP, unknown pathogen  - 2D echo EF 20-25% in the past month so no need to repeat new ECHO - improved diuresis  - lasix dose was increased from 60 mg IV BID to 80 mg IV BID on June 13th, 2017 - weight 120 kg --> 121 kg --> 117 kg this AM - plan to d/c SNF in AM  LLL and RUL PNA, CAP - continue with doxycycline day #6/7, BD as needed - trep pneumo, sputum cultures, negative to date   AECOPD, pulmonary fibrosis - was on solumedrol and transitioned to Prednisone, continue to taper down  - continue BD scheduled and as needed   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  - hold home glipizide, SSI while inpatient   CKD stage III  - review of records indicate GFR in 40's with baseline Cr ~ 1.4 - Cr trend: 1.3 --> 1.54 --> 1.48 --> 1.36 --> 1.49 - BMP in AM  PAF, CHADS2 score 5 - continue Coreg, Eliquis - rate controlled for now  - keep on telemetry   Hypokalemia due to lasix use - supplemented and remains WNL this AM   HTN, essential  - continue home medications   CAD - stable no chest pain, continue current regimen - trop elevated, flat   Morbid obesity due to excess calories - pt meets criteria with BMI > 35 and underlying risks of HTN, DM - Body mass index is 36.01 kg/(m^2)  DVT prophylaxis: Eliquis Code Status: Full Family Communication: Patient at bedside  Disposition Plan: SNF in AM  Consultants:   Cardiology   Procedures:    None  Antimicrobials:   Doxycycline 6/9 -->   Subjective: Reports feeling better, less LE edema and pt says it is easier to breathe.  Objective: Filed Vitals:   03/01/16 1434 03/01/16 2014 03/01/16 2131 03/02/16 0435  BP:   93/61 94/70  Pulse:   97 99  Temp:   97.4 F (36.3 C) 97.8 F (36.6 C)  TempSrc:   Oral Oral  Resp:   20 20  Height:      Weight:    117.799 kg (259 lb 11.2 oz)  SpO2: 98% 97% 98% 100%    Intake/Output Summary (Last 24 hours) at 03/02/16 0642 Last data filed at 03/02/16 0600  Gross per 24 hour  Intake   1282 ml  Output   4827 ml  Net  -3545 ml   Filed Weights   02/29/16 0617 03/01/16 0431 03/02/16 0435  Weight: 121.11 kg (267 lb) 121.7 kg (268 lb 4.8 oz) 117.799 kg (259 lb 11.2 oz)    Examination:  General exam: Appears calm and comfortable if sitting in chair, dyspnea with standing and even few steps walking  Respiratory system: improved air movement with scattered rales  Cardiovascular system:  IRRR. No rubs, gallops or clicks. Bilateral LE edema +1 -2 Gastrointestinal system: Abdomen is nondistended, soft and nontender.  Central nervous system:  Alert and oriented. No focal neurological deficits. Extremities: Symmetric 5 x 5 power. + 1-2 bilateral LE pitting edema, chronic venous stasis changes    Data Reviewed: I have personally reviewed following labs and imaging studies  CBC:  Recent Labs Lab 02/26/16 1654 02/28/16 0358 02/29/16 0350 03/01/16 0301 03/02/16 0329  WBC 7.4 5.6 10.1 9.0 8.4  HGB 12.0* 11.5* 11.3* 11.1* 11.8*  HCT 37.8* 37.1* 36.3* 35.5* 37.5*  MCV 87.5 89.0 88.5 88.5 87.6  PLT 191 191 186 174 [id number]   Basic Metabolic Panel:  Recent Labs Lab 02/27/16 0227 02/28/16 0358 02/29/16 0350 03/01/16 0301 03/01/16 1505 03/02/16 0329  NA 139 138 138 137  --  138  K 3.3* 4.0 4.0 4.2  --  3.7  CL 100* 100* 98* 98*  --  92*  CO2 30 30 31 31   --  36*  GLUCOSE 133* 172* 189* 153*  --  158*  BUN 22* 29* 37* 45*  --   49*  CREATININE 1.33* 1.54* 1.48* 1.36*  --  1.49*  CALCIUM 8.8* 9.0 8.9 8.9  --  9.2  MG  --   --   --   --  2.3  --    Liver Function Tests:  Recent Labs Lab 02/27/16 0227  AST 24  ALT 25  ALKPHOS 90  BILITOT 0.9  PROT 6.0*  ALBUMIN 3.1*   Cardiac Enzymes:  Recent Labs Lab 02/26/16 2231 02/27/16 0227 02/27/16 1553 02/27/16 2126 02/28/16 0358  TROPONINI 0.03 0.04* 0.06* 0.03 0.03   BNP (last 3 results)  Recent Labs  10/06/15 1153 10/21/15 1106 01/05/16 1148  PROBNP 474.0* 318.0* 538.0*   CBG:  Recent Labs Lab 03/01/16 0636 03/01/16 1113 03/01/16 1633 03/01/16 2054 03/02/16 0533  GLUCAP 146* 170* 190* 189* 145*   Urine analysis:    Component Value Date/Time   COLORURINE YELLOW 12/16/2015 0011   APPEARANCEUR CLEAR 12/16/2015 0011   LABSPEC 1.013 12/16/2015 0011   PHURINE 6.0 12/16/2015 0011   GLUCOSEU NEGATIVE 12/16/2015 0011   HGBUR NEGATIVE 12/16/2015 0011   BILIRUBINUR NEGATIVE 12/16/2015 0011   KETONESUR NEGATIVE 12/16/2015 0011   PROTEINUR NEGATIVE 12/16/2015 0011   NITRITE NEGATIVE 12/16/2015 0011   LEUKOCYTESUR NEGATIVE 12/16/2015 0011     Radiology Studies: Dg Chest 2 View 02/26/2016  Cardiomegaly and edema. Focal opacity in the right lateral lung.   Ct Chest Wo Contrast 02/27/2016   Again noted chronic interstitial coarsening bilaterally. Peripheral reticulation and architectural distortion consistent with fibrotic changes again noted. Findings consistent with pulmonary fibrosis. There is patchy superimposed peripheral ground-glass attenuation in right upper lobe. There is patchy airspace disease in left lower lobe. Findings highly suspicious for superimposed infiltrate/ pneumonia. Follow-up to resolution after treatment is recommended. 2. Stable mediastinal adenopathy. 3. Small dependent secretions are noted in mid trachea without evidence of airway obstruction. 4. There is small to moderate right pleural effusion. Small left pleural  effusion. 5. Degenerative changes thoracolumbar spine.   Scheduled Meds: . albuterol  2.5 mg Nebulization TID  . allopurinol  100 mg Oral BID  . apixaban  2.5 mg Oral BID  . aspirin EC  81 mg Oral Daily  . atorvastatin  10 mg Oral QHS  . carvedilol  3.125 mg Oral BID WC  . doxycycline  100 mg Oral Q12H  . fluticasone  2 spray Each Nare Daily  . furosemide  80 mg Intravenous BID  . guaiFENesin  600 mg Oral BID  . insulin aspart  0-9 Units  Subcutaneous TID WC  . losartan  25 mg Oral Daily  . mometasone-formoterol  2 puff Inhalation BID  . montelukast  10 mg Oral QHS  . pantoprazole  40 mg Oral Daily  . polyethylene glycol  17 g Oral Daily  . potassium chloride  40 mEq Oral Daily  . predniSONE  50 mg Oral Q breakfast  . senna-docusate  1 tablet Oral BID  . sodium chloride flush  3 mL Intravenous Q12H  . tiotropium  18 mcg Inhalation Daily  . traZODone  25 mg Oral QHS   Continuous Infusions:  Time spent: 20 minutes   Faye Ramsay, MD [facility] Pager [phone]  If 7PM-7AM, please contact night-coverage [email or web address] Password [facility] 03/02/2016, 6:42 AM

## 2016-03-02 NOTE — Clinical Social Work Note (Addendum)
CSW as been on contact with patient's daughter today discussing ALF/SNF options. Patient wondering whether he should go to SNF as long-term resident. Patient has bed at ALF tomorrow morning. Per granddaughter, he needs to be there by 10:00 am.  Dayton Scrape, [place]  4:02 pm Patient's daughter has chosen ALF. Spoke with Maximino Sarin from [facility]. Patient needs TB test prior to going to facility, a palliative order on discharge summary for ALF, and an updated FL2 with no IV medications. CSW paged MD.  Dayton Scrape, [place]

## 2016-03-03 LAB — CBC
HCT: 40.8 % (ref 39.0–52.0)
Hemoglobin: 12.9 g/dL — ABNORMAL LOW (ref 13.0–17.0)
MCH: 27.9 pg (ref 26.0–34.0)
MCHC: 31.6 g/dL (ref 30.0–36.0)
MCV: 88.1 fL (ref 78.0–100.0)
PLATELETS: 208 10*3/uL (ref 150–400)
RBC: 4.63 MIL/uL (ref 4.22–5.81)
RDW: 18.1 % — AB (ref 11.5–15.5)
WBC: 8.1 10*3/uL (ref 4.0–10.5)

## 2016-03-03 LAB — GLUCOSE, CAPILLARY
GLUCOSE-CAPILLARY: 160 mg/dL — AB (ref 65–99)
GLUCOSE-CAPILLARY: 168 mg/dL — AB (ref 65–99)
GLUCOSE-CAPILLARY: 215 mg/dL — AB (ref 65–99)
Glucose-Capillary: 260 mg/dL — ABNORMAL HIGH (ref 65–99)
Glucose-Capillary: 198 mg/dL — ABNORMAL HIGH (ref 65–99)

## 2016-03-03 LAB — BASIC METABOLIC PANEL
Anion gap: 10 (ref 5–15)
BUN: 45 mg/dL — AB (ref 6–20)
CHLORIDE: 87 mmol/L — AB (ref 101–111)
CO2: 42 mmol/L — ABNORMAL HIGH (ref 22–32)
CREATININE: 1.37 mg/dL — AB (ref 0.61–1.24)
Calcium: 9.3 mg/dL (ref 8.9–10.3)
GFR, EST AFRICAN AMERICAN: 53 mL/min — AB (ref >60)
GFR, EST NON AFRICAN AMERICAN: 46 mL/min — AB (ref >60)
Glucose, Bld: 150 mg/dL — ABNORMAL HIGH (ref 65–99)
POTASSIUM: 3.6 mmol/L (ref 3.5–5.1)
SODIUM: 139 mmol/L (ref 135–145)

## 2016-03-03 MED ORDER — APIXABAN 2.5 MG PO TABS
2.5000 mg | ORAL_TABLET | ORAL | Status: AC
  Administered 2016-03-03: 2.5 mg via ORAL
  Filled 2016-03-03: qty 1

## 2016-03-03 MED ORDER — POTASSIUM CHLORIDE CRYS ER 20 MEQ PO TBCR
20.0000 meq | EXTENDED_RELEASE_TABLET | Freq: Once | ORAL | Status: AC
  Administered 2016-03-03: 20 meq via ORAL
  Filled 2016-03-03: qty 1

## 2016-03-03 MED ORDER — TORSEMIDE 20 MG PO TABS
40.0000 mg | ORAL_TABLET | Freq: Two times a day (BID) | ORAL | Status: DC
  Administered 2016-03-04: 40 mg via ORAL
  Filled 2016-03-03: qty 2

## 2016-03-03 MED ORDER — APIXABAN 5 MG PO TABS
5.0000 mg | ORAL_TABLET | Freq: Two times a day (BID) | ORAL | Status: DC
  Administered 2016-03-03 – 2016-03-04 (×2): 5 mg via ORAL
  Filled 2016-03-03 (×2): qty 1

## 2016-03-03 MED ORDER — POTASSIUM CHLORIDE CRYS ER 20 MEQ PO TBCR
40.0000 meq | EXTENDED_RELEASE_TABLET | Freq: Once | ORAL | Status: AC
  Administered 2016-03-03: 40 meq via ORAL
  Filled 2016-03-03: qty 2

## 2016-03-03 MED ORDER — FUROSEMIDE 10 MG/ML IJ SOLN
80.0000 mg | Freq: Once | INTRAMUSCULAR | Status: AC
  Administered 2016-03-03: 80 mg via INTRAVENOUS
  Filled 2016-03-03: qty 8

## 2016-03-03 NOTE — Progress Notes (Signed)
Advanced Heart Failure Rounding Note  Referring Physician: Dr. Doyle Askew   PCP: Mackie Pai, PA-C  Primary Cardiologist: Dr. Marlou Porch HF: Dr. Haroldine Laws EP: Dr. Rayann Heman  Subjective:    Admitted 02/26/16 with 2 weeks of DOE + edema  Continues to feel better but still with cough. Denies SOB.  Edema improving.   PT recommending SNF as of 02/28/16. Likely to SNF vs ALF with hospice services in place.    Out 2.6 L and an additional 5 lbs with IV lasix yesterday.Marland Kitchen  He is already out 1.8 L this am.  Creatinine and BUN relatively stable.   Objective:   Weight Range: 254 lb (115.214 kg) Body mass index is 34.44 kg/(m^2).   Vital Signs:   Temp:  [97.5 F (36.4 C)-97.8 F (36.6 C)] 97.8 F (36.6 C) (06/14 2121) Pulse Rate:  [85-104] 89 (06/15 0518) Resp:  [20] 20 (06/15 0518) BP: (95-107)/(57-61) 102/60 mmHg (06/15 0518) SpO2:  [97 %-99 %] 99 % (06/15 0518) Weight:  [254 lb (115.214 kg)] 254 lb (115.214 kg) (06/15 0518) Last BM Date: 03/01/16  Weight change: Filed Weights   03/01/16 0431 03/02/16 0435 03/03/16 0518  Weight: 268 lb 4.8 oz (121.7 kg) 259 lb 11.2 oz (117.799 kg) 254 lb (115.214 kg)    Intake/Output:   Intake/Output Summary (Last 24 hours) at 03/03/16 0914 Last data filed at 03/03/16 [id number]  Gross per 24 hour  Intake    840 ml  Output   4200 ml  Net  -3360 ml     Physical Exam: General: Elderly appearing. Less dyspnea. HEENT: normal Neck: supple. JVP 7-8. Carotids 2+ bilaterally; no bruits. No thyromegaly or nodule noted.  Cor: PMI normal. Irregular rate & rhythm. No M/G/R Lungs: Decreased throughout with slight crackles Abdomen: obese, soft, non-tender, non-distended, no HSM. No bruits or masses. +BS  Extremities: no cyanosis, clubbing, rash, trace-1+ edema Neuro: alert & orientedx3, cranial nerves grossly intact. Moves all 4 extremities w/o difficulty. Affect pleasant.  Telemetry: reviewed personally, Afib 90-100s  Labs: CBC  Recent Labs  03/02/16 0329 03/03/16 0537  WBC 8.4 8.1  HGB 11.8* 12.9*  HCT 37.5* 40.8  MCV 87.6 88.1  PLT 187 [id number]   Basic Metabolic Panel  Recent Labs  03/01/16 1505 03/02/16 0329 03/03/16 0537  NA  --  138 139  K  --  3.7 3.6  CL  --  92* 87*  CO2  --  36* 42*  GLUCOSE  --  158* 150*  BUN  --  49* 45*  CREATININE  --  1.49* 1.37*  CALCIUM  --  9.2 9.3  MG 2.3  --   --    Liver Function Tests No results for input(s): AST, ALT, ALKPHOS, BILITOT, PROT, ALBUMIN in the last 72 hours. No results for input(s): LIPASE, AMYLASE in the last 72 hours. Cardiac Enzymes No results for input(s): CKTOTAL, CKMB, CKMBINDEX, TROPONINI in the last 72 hours.  BNP: BNP (last 3 results)  Recent Labs  12/15/15 2100 02/02/16 1447 02/26/16 1654  BNP 481.6* 604.4* 999.3*    ProBNP (last 3 results)  Recent Labs  10/06/15 1153 10/21/15 1106 01/05/16 1148  PROBNP 474.0* 318.0* 538.0*     D-Dimer No results for input(s): DDIMER in the last 72 hours. Hemoglobin A1C No results for input(s): HGBA1C in the last 72 hours. Fasting Lipid Panel No results for input(s): CHOL, HDL, LDLCALC, TRIG, CHOLHDL, LDLDIRECT in the last 72 hours. Thyroid Function Tests No results for input(s): TSH, T4TOTAL,  T3FREE, THYROIDAB in the last 72 hours.  Invalid input(s): FREET3  Other results:     Imaging/Studies:  No results found.  Latest Echo  Latest Cath   Medications:     Scheduled Medications: . allopurinol  100 mg Oral BID  . apixaban  2.5 mg Oral BID  . aspirin EC  81 mg Oral Daily  . atorvastatin  10 mg Oral QHS  . carvedilol  3.125 mg Oral BID WC  . doxycycline  100 mg Oral Q12H  . fluticasone  2 spray Each Nare Daily  . guaiFENesin  600 mg Oral BID  . insulin aspart  0-9 Units Subcutaneous TID WC  . losartan  25 mg Oral Daily  . mometasone-formoterol  2 puff Inhalation BID  . montelukast  10 mg Oral QHS  . pantoprazole  40 mg Oral Daily  . polyethylene glycol  17 g Oral Daily    . potassium chloride  40 mEq Oral Daily  . predniSONE  50 mg Oral Q breakfast  . senna-docusate  1 tablet Oral BID  . sodium chloride flush  3 mL Intravenous Q12H  . tiotropium  18 mcg Inhalation Daily  . torsemide  40 mg Oral BID  . traZODone  25 mg Oral QHS    Infusions:    PRN Medications: sodium chloride, guaiFENesin, ipratropium-albuterol, sodium chloride flush   Assessment   1. Dyspnea - multifactorial 2. A/C systolic HF 3. Atrial fibriliation, permanent 4. CAD s/p D1 stent - Patent LHC 2017 5. HTN 6. HLD 7. CKD stage III 8. COPD 9. OSA with CPAP 10. Pulmonary fibrosis  Plan    Dyspnea seems to have improved now with better diuresis.   Volume status much improved with 80 lasix BID yesterday.  JVP 7-8. Had po torsemide this morning. If he stays again over night can consider giving addition dose of IV lasix, but so far this am has diuresed well on po torsemide. (nearly 500 cc in bedside urinal)   Lungs still with some rhonchi.   CAD stable without ACS. Continue nightly CPAP for OSA.   Per notes planned for d/c today to SNF vs ALF with Hospice services, but pt states he has been told he will go tomorrow.     He has follow up with HF clinic next week.   Length of Stay: 5  Shirley Friar PA-C 03/03/2016, 9:14 AM  Advanced Heart Failure Team Pager [phone] (M-F; 7a - 4p)  Please contact [facility] for night-coverage after hours (4p -7a ) and weekends on [email or web address]  Patient seen and examined with Oda Kilts, PA-C. We discussed all aspects of the encounter. I agree with the assessment and plan as stated above.   Volume status continues to improve. Plan d/c to ALF tomorrow. Renal function stable. Will give one more dose of IV lasix. Supp Kcl.   Mirenda Baltazar,MD 3:30 PM

## 2016-03-03 NOTE — Clinical Social Work Note (Signed)
CSW acknowledges consult for setting up hospice at ALF. Palliative NP saw patient yesterday and ALF is aware. ALF asked that order be put in discharge summary. CSW notified MD yesterday.  Christopher Holder, [place]

## 2016-03-03 NOTE — Progress Notes (Signed)
Added H20 to Cpap,  Pt will self admin.

## 2016-03-03 NOTE — Clinical Social Work Note (Signed)
ALF notified that patient will discharge tomorrow instead of today.  Christopher Holder, [name]

## 2016-03-03 NOTE — Care Management Important Message (Signed)
Important Message  Patient Details  Name: Christopher Holder MRN: [id number] Date of Birth: 08/09/1930   Medicare Important Message Given:  Yes    [name] 03/03/2016, 9:34 AM

## 2016-03-03 NOTE — Progress Notes (Signed)
Patient ID: Christopher Holder, male   DOB: 1930-07-11, 82 y.o.   MRN: [id number]    PROGRESS NOTE    Christopher Holder  [id number] DOB: 09-Feb-1930 DOA: 02/26/2016  PCP: Mackie Pai, PA-C   Brief Narrative:  Patient is a 82 yo male with known chronic combined CHF and pulmonary fibrosis, EF 20-25%, presented to [facility] ED with main concern of progressive dyspnea with exertion and at rest.   Assessment & Plan:  Acute hypoxic respiratory failure due to acute on chronic combined CHF, pulmonary fibrosis, AECOPD, acute LLL and RUL CAP, unknown pathogen  - 2D echo EF 20-25% in the past month so no need to repeat new ECHO - improved diuresis  - still on Lasix IV 80 mg BID, dose adjustment per cardiology  - weight 120 kg --> 121 kg --> 117 kg --> 115 kg this AM - plan to d/c SNF in AM  LLL and RUL PNA, CAP - continue with doxycycline day #7/7, BD as needed - trep pneumo, sputum cultures, negative to date   AECOPD, pulmonary fibrosis - was on solumedrol and transitioned to Prednisone, continue to taper down  - today pt got 50 mg prednisone PO, plan to taper down by 10 mg daily until completed  - continue BD scheduled and as needed   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  - hold home glipizide, SSI while inpatient   CKD stage III  - review of records indicate GFR in 40's with baseline Cr ~ 1.4 - Cr trend: 1.3 --> 1.54 --> 1.48 --> 1.36 --> 1.49 --> 1.37 - BMP in AM  PAF, CHADS2 score 5 - continue Coreg, Eliquis - rate controlled for now   Hypokalemia due to lasix use - supplemented and remains WNL this AM   HTN, essential  - continue home medications   CAD - stable no chest pain, continue current regimen - trop elevated, flat   Morbid obesity due to excess calories - pt meets criteria with BMI > 35 and underlying risks of HTN, DM - Body mass index is 36.01 kg/(m^2)  DVT prophylaxis: Eliquis Code Status: Full Family Communication: Patient at bedside  Disposition  Plan: SNF in AM  Consultants:   Cardiology   Procedures:   None  Antimicrobials:   Doxycycline 6/9 -->   Subjective: Reports feeling better, less LE edema and pt says it is easier to breathe.  Objective: Filed Vitals:   03/02/16 2121 03/03/16 0518 03/03/16 1003 03/03/16 1532  BP: 95/61 102/60  92/57  Pulse: 85 89  85  Temp: 97.8 F (36.6 C)   98.5 F (36.9 C)  TempSrc: Oral Oral  Oral  Resp: 20 20  20   Height:      Weight:  115.214 kg (254 lb)    SpO2: 97% 99% 98% 98%    Intake/Output Summary (Last 24 hours) at 03/03/16 1548 Last data filed at 03/03/16 1300  Gross per 24 hour  Intake   1080 ml  Output   4075 ml  Net  -2995 ml   Filed Weights   03/01/16 0431 03/02/16 0435 03/03/16 0518  Weight: 121.7 kg (268 lb 4.8 oz) 117.799 kg (259 lb 11.2 oz) 115.214 kg (254 lb)    Examination:  General exam: Appears calm and comfortable if sitting in chair, dyspnea with standing and even few steps walking  Respiratory system: improved air movement with mild bibasilar crackles  Cardiovascular system:  IRRR. No rubs, gallops or clicks. Bilateral LE edema +1  Gastrointestinal system: Abdomen is nondistended, soft and nontender.  Central nervous system: Alert and oriented. No focal neurological deficits. Extremities: Symmetric 5 x 5 power. + 1-2 bilateral LE pitting edema, chronic venous stasis changes    Data Reviewed: I have personally reviewed following labs and imaging studies  CBC:  Recent Labs Lab 02/28/16 0358 02/29/16 0350 03/01/16 0301 03/02/16 0329 03/03/16 0537  WBC 5.6 10.1 9.0 8.4 8.1  HGB 11.5* 11.3* 11.1* 11.8* 12.9*  HCT 37.1* 36.3* 35.5* 37.5* 40.8  MCV 89.0 88.5 88.5 87.6 88.1  PLT 191 186 174 187 [id number]   Basic Metabolic Panel:  Recent Labs Lab 02/28/16 0358 02/29/16 0350 03/01/16 0301 03/01/16 1505 03/02/16 0329 03/03/16 0537  NA 138 138 137  --  138 139  K 4.0 4.0 4.2  --  3.7 3.6  CL 100* 98* 98*  --  92* 87*  CO2 30 31 31   --   36* 42*  GLUCOSE 172* 189* 153*  --  158* 150*  BUN 29* 37* 45*  --  49* 45*  CREATININE 1.54* 1.48* 1.36*  --  1.49* 1.37*  CALCIUM 9.0 8.9 8.9  --  9.2 9.3  MG  --   --   --  2.3  --   --    Liver Function Tests:  Recent Labs Lab 02/27/16 0227  AST 24  ALT 25  ALKPHOS 90  BILITOT 0.9  PROT 6.0*  ALBUMIN 3.1*   Cardiac Enzymes:  Recent Labs Lab 02/26/16 2231 02/27/16 0227 02/27/16 1553 02/27/16 2126 02/28/16 0358  TROPONINI 0.03 0.04* 0.06* 0.03 0.03   BNP (last 3 results)  Recent Labs  10/06/15 1153 10/21/15 1106 01/05/16 1148  PROBNP 474.0* 318.0* 538.0*   CBG:  Recent Labs Lab 03/02/16 1644 03/02/16 2118 03/03/16 0523 03/03/16 0618 03/03/16 1210  GLUCAP 184* 179* 160* 168* 260*   Urine analysis:    Component Value Date/Time   COLORURINE YELLOW 12/16/2015 0011   APPEARANCEUR CLEAR 12/16/2015 0011   LABSPEC 1.013 12/16/2015 0011   PHURINE 6.0 12/16/2015 0011   GLUCOSEU NEGATIVE 12/16/2015 0011   HGBUR NEGATIVE 12/16/2015 0011   BILIRUBINUR NEGATIVE 12/16/2015 0011   KETONESUR NEGATIVE 12/16/2015 0011   PROTEINUR NEGATIVE 12/16/2015 0011   NITRITE NEGATIVE 12/16/2015 0011   LEUKOCYTESUR NEGATIVE 12/16/2015 0011     Radiology Studies: Dg Chest 2 View 02/26/2016  Cardiomegaly and edema. Focal opacity in the right lateral lung.   Ct Chest Wo Contrast 02/27/2016   Again noted chronic interstitial coarsening bilaterally. Peripheral reticulation and architectural distortion consistent with fibrotic changes again noted. Findings consistent with pulmonary fibrosis. There is patchy superimposed peripheral ground-glass attenuation in right upper lobe. There is patchy airspace disease in left lower lobe. Findings highly suspicious for superimposed infiltrate/ pneumonia. Follow-up to resolution after treatment is recommended. 2. Stable mediastinal adenopathy. 3. Small dependent secretions are noted in mid trachea without evidence of airway obstruction. 4.  There is small to moderate right pleural effusion. Small left pleural effusion. 5. Degenerative changes thoracolumbar spine.   Scheduled Meds: . allopurinol  100 mg Oral BID  . apixaban  5 mg Oral BID  . aspirin EC  81 mg Oral Daily  . atorvastatin  10 mg Oral QHS  . carvedilol  3.125 mg Oral BID WC  . doxycycline  100 mg Oral Q12H  . fluticasone  2 spray Each Nare Daily  . furosemide  80 mg Intravenous Once  . guaiFENesin  600 mg Oral BID  . insulin  aspart  0-9 Units Subcutaneous TID WC  . losartan  25 mg Oral Daily  . mometasone-formoterol  2 puff Inhalation BID  . montelukast  10 mg Oral QHS  . pantoprazole  40 mg Oral Daily  . polyethylene glycol  17 g Oral Daily  . potassium chloride  40 mEq Oral Daily  . potassium chloride  40 mEq Oral Once  . predniSONE  50 mg Oral Q breakfast  . senna-docusate  1 tablet Oral BID  . sodium chloride flush  3 mL Intravenous Q12H  . tiotropium  18 mcg Inhalation Daily  . [START ON 03/04/2016] torsemide  40 mg Oral BID  . traZODone  25 mg Oral QHS   Continuous Infusions:  Time spent: 20 minutes   Faye Ramsay, MD [facility] Pager [phone]  If 7PM-7AM, please contact night-coverage [email or web address] Password [facility] 03/03/2016, 3:48 PM

## 2016-03-04 DIAGNOSIS — R0600 Dyspnea, unspecified: Secondary | ICD-10-CM

## 2016-03-04 DIAGNOSIS — J438 Other emphysema: Secondary | ICD-10-CM

## 2016-03-04 DIAGNOSIS — Z515 Encounter for palliative care: Secondary | ICD-10-CM

## 2016-03-04 DIAGNOSIS — I5022 Chronic systolic (congestive) heart failure: Secondary | ICD-10-CM

## 2016-03-04 DIAGNOSIS — I5040 Unspecified combined systolic (congestive) and diastolic (congestive) heart failure: Secondary | ICD-10-CM

## 2016-03-04 LAB — MAGNESIUM: Magnesium: 2.3 mg/dL (ref 1.7–2.4)

## 2016-03-04 LAB — CBC
HEMATOCRIT: 41 % (ref 39.0–52.0)
HEMOGLOBIN: 12.8 g/dL — AB (ref 13.0–17.0)
MCH: 27.8 pg (ref 26.0–34.0)
MCHC: 31.2 g/dL (ref 30.0–36.0)
MCV: 89.1 fL (ref 78.0–100.0)
Platelets: 190 10*3/uL (ref 150–400)
RBC: 4.6 MIL/uL (ref 4.22–5.81)
RDW: 18.2 % — AB (ref 11.5–15.5)
WBC: 10.3 10*3/uL (ref 4.0–10.5)

## 2016-03-04 LAB — BASIC METABOLIC PANEL
ANION GAP: 6 (ref 5–15)
BUN: 46 mg/dL — AB (ref 6–20)
CALCIUM: 9.1 mg/dL (ref 8.9–10.3)
CO2: 43 mmol/L — ABNORMAL HIGH (ref 22–32)
Chloride: 91 mmol/L — ABNORMAL LOW (ref 101–111)
Creatinine, Ser: 1.5 mg/dL — ABNORMAL HIGH (ref 0.61–1.24)
GFR calc Af Amer: 48 mL/min — ABNORMAL LOW (ref >60)
GFR, EST NON AFRICAN AMERICAN: 41 mL/min — AB (ref >60)
GLUCOSE: 142 mg/dL — AB (ref 65–99)
Potassium: 4.1 mmol/L (ref 3.5–5.1)
SODIUM: 140 mmol/L (ref 135–145)

## 2016-03-04 LAB — GLUCOSE, CAPILLARY: Glucose-Capillary: 148 mg/dL — ABNORMAL HIGH (ref 65–99)

## 2016-03-04 MED ORDER — TORSEMIDE 20 MG PO TABS
40.0000 mg | ORAL_TABLET | Freq: Two times a day (BID) | ORAL | Status: DC
Start: 2016-03-04 — End: 2016-04-02

## 2016-03-04 MED ORDER — ASPIRIN 81 MG PO TBEC: 81.0000 mg | DELAYED_RELEASE_TABLET | Freq: Every day | ORAL | Status: DC

## 2016-03-04 MED ORDER — GLIPIZIDE 5 MG PO TABS: 5.0000 mg | ORAL_TABLET | Freq: Every day | ORAL | Status: AC

## 2016-03-04 MED ORDER — PREDNISONE 20 MG PO TABS: ORAL_TABLET | ORAL | Status: DC

## 2016-03-04 NOTE — Care Management Important Message (Signed)
Important Message  Patient Details  Name: Christopher Holder MRN: [id number] Date of Birth: Aug 26, 1930   Medicare Important Message Given:  Yes    [name] 03/04/2016, 9:21 AM

## 2016-03-04 NOTE — Care Management Note (Signed)
Case Management Note  Patient Details  Name: Christopher Holder MRN: [id number] Date of Birth: 19-Dec-1929  Subjective/Objective:     Pt has been living with granddaughter but will discharge to [facility] ALF with home hospice following.  Granddaughter's choice is [facility], referral called.                      Expected Discharge Plan:  Assisted Living / Rest Home  Discharge planning Services  CM Consult  Post Acute Care Choice:  Hospice Choice offered to:   (Granddaughter)  HH Arranged:  Therapist, sports, Social Work [employer] Agency:  [facility] [place] of Service:  Completed, signed off  Medicare Important Message Given:  Yes  Tewana Bohlen, Kym Groom, RN 03/04/2016, 10:32 AM

## 2016-03-04 NOTE — NC FL2 (Signed)
[place] LEVEL OF CARE SCREENING TOOL     IDENTIFICATION  Patient Name: Christopher Holder Birthdate: December 19, 1929 Sex: male Admission Date (Current Location): 02/26/2016  [facility] and [place] Number:  Herbalist and Address:  [facility] [place], [place] [address] [place], [place] [address]      Provider Number: [id number]  Attending Physician Name and Address:  Murlean Iba, MD  Relative Name and Phone Number:       Current Level of Care: Hospital Recommended Level of Care: [place] (with hospice) Prior Approval Number:    Date Approved/Denied:   PASRR Number: [id number]  Discharge Plan: Other (Comment) [facility] ALF)    Current Diagnoses: Patient Active Problem List   Diagnosis Date Noted  . Constipation   . NSVT (nonsustained ventricular tachycardia) ([place])   . Palliative care encounter 02/28/2016  . DNR (do not resuscitate) discussion 02/28/2016  . Dyspnea   . Pulmonary fibrosis ([place])   . Acute on chronic systolic congestive heart failure ([place])   . Chronic obstructive pulmonary disease ([place])   . Heart failure ([place]) 02/26/2016  . Chronic systolic CHF (congestive heart failure), NYHA class 4 ([place]) 02/02/2016  . Pulmonary fibrosis, postinflammatory ([place]) 02/02/2016  . DOE (dyspnea on exertion) 02/02/2016  . Asthma 01/15/2016  . CKD (chronic kidney disease), stage III 12/29/2015  . Renal failure (ARF), acute on chronic (HCC) 12/16/2015  . Hypoxia 12/15/2015  . Pain in lower limb 10/28/2015  . NICM (nonischemic cardiomyopathy) ([place]) 10/02/2015  . Abnormal nuclear stress test 10/01/2015  . Acute respiratory failure ([place])   . Community acquired pneumonia   . Cough with hemoptysis   . Sepsis ([place])   . Chronic systolic congestive heart failure ([place])   . Pulmonary infiltrate   . OSA on CPAP   . Hemoptysis 08/29/2015  . Persistent atrial fibrillation ([place]) 08/26/2015  . Severe sepsis ([place])   . ARDS (adult  respiratory distress syndrome) ([place])   . Renal failure, acute on chronic (HCC)   . Acute systolic CHF (congestive heart failure) ([place])   . Acute respiratory failure with hypoxemia ([place])   . Systolic CHF, acute on chronic (HCC)   . Prolonged QT interval   . Sepsis due to pneumonia ([place]) 08/18/2015  . AKI (acute kidney injury) ([place]) 08/18/2015  . Metabolic encephalopathy [id number]  . Thrombocytopenia ([place]) 08/18/2015  . Acute respiratory failure with hypoxia ([place]) 08/18/2015  . Hard of hearing 08/18/2015  . Chronic sinus bradycardia 08/18/2015  . CAP (community acquired pneumonia) 08/18/2015  . Acute pulmonary edema ([place]) 08/18/2015  . Septic shock ([place]) 08/18/2015  . Chronic cough 08/07/2015  . Onychomycosis 07/28/2015  . Injury by nail 07/28/2015  . Coronary artery disease involving native coronary artery of native heart without angina pectoris 06/19/2015  . Obesity 06/19/2015  . Obstructive sleep apnea 06/19/2015  . Other emphysema ([place]) 06/19/2015  . Diabetes mellitus type 2, controlled ([place]) 05/27/2015  . HTN (hypertension) 05/27/2015  . Hyperlipidemia 05/27/2015  . GERD (gastroesophageal reflux disease) 05/27/2015  . Allergic rhinitis 05/27/2015  . Diabetic neuropathy ([place]) 05/27/2015  . CHF (congestive heart failure) ([place]) 05/27/2015    Orientation RESPIRATION BLADDER Height & Weight     Self, Time, Situation, Place  O2, Other (Comment) (Nasal Canula 3 L. CPAP at night.) Continent Weight: 249 lb 9.6 oz (113.218 kg) (a scale) Height:  6' (182.9 cm)  BEHAVIORAL SYMPTOMS/MOOD NEUROLOGICAL BOWEL NUTRITION STATUS   (None)  (None) Continent Diet (Heart  healthy)  AMBULATORY STATUS COMMUNICATION OF NEEDS Skin   Limited Assist Verbally Normal                       Personal Care Assistance Level of Assistance    Bathing Assistance: Limited assistance Feeding assistance: Independent Dressing Assistance: Limited assistance     Functional Limitations Info  Sight,  Hearing, Speech Sight Info: Adequate Hearing Info: Impaired Speech Info: Adequate    SPECIAL CARE FACTORS FREQUENCY  Blood pressure, Diabetic urine testing, PT (By licensed PT)     PT Frequency: 5 x week              Contractures Contractures Info: Not present    Additional Factors Info  Code Status, Allergies Code Status Info: Full Allergies Info: Percoset, Sulfa Antibiotics           Current Medications (03/04/2016):  This is the current hospital active medication list Current Facility-Administered Medications  Medication Dose Route Frequency Provider Last Rate Last Dose  . allopurinol (ZYLOPRIM) tablet 100 mg  100 mg Oral BID Gennaro Africa, MD   100 mg at 03/04/16 0923  . apixaban (ELIQUIS) tablet 5 mg  5 mg Oral BID Shirley Friar, PA-C   5 mg at 03/04/16 [id number]  . aspirin EC tablet 81 mg  81 mg Oral Daily Gennaro Africa, MD   81 mg at 03/04/16 [id number]  . atorvastatin (LIPITOR) tablet 10 mg  10 mg Oral QHS Gennaro Africa, MD   10 mg at 03/03/16 2049  . carvedilol (COREG) tablet 3.125 mg  3.125 mg Oral BID WC Theodis Blaze, MD   3.125 mg at 03/04/16 0817  . doxycycline (VIBRA-TABS) tablet 100 mg  100 mg Oral Q12H Gennaro Africa, MD   100 mg at 03/04/16 0923  . fluticasone (FLONASE) 50 MCG/ACT nasal spray 2 spray  2 spray Each Nare Daily Gennaro Africa, MD   2 spray at 03/04/16 1000  . guaiFENesin (MUCINEX) 12 hr tablet 600 mg  600 mg Oral BID Theodis Blaze, MD   600 mg at 03/04/16 [id number]  . guaiFENesin (ROBITUSSIN) 100 MG/5ML solution 200 mg  200 mg Oral Q4H PRN Gennaro Africa, MD   200 mg at 03/04/16 0923  . insulin aspart (novoLOG) injection 0-9 Units  0-9 Units Subcutaneous TID WC Theodis Blaze, MD   1 Units at 03/04/16 [phone]  . ipratropium-albuterol (DUONEB) 0.5-2.5 (3) MG/3ML nebulizer solution 3 mL  3 mL Nebulization Q4H PRN Theodis Blaze, MD      . losartan (COZAAR) tablet 25 mg  25 mg Oral Daily Gennaro Africa, MD   25 mg at 03/04/16 [id number]  . mometasone-formoterol (DULERA) 200-5  MCG/ACT inhaler 2 puff  2 puff Inhalation BID Gennaro Africa, MD   2 puff at 03/04/16 0726  . montelukast (SINGULAIR) tablet 10 mg  10 mg Oral QHS Gennaro Africa, MD   10 mg at 03/03/16 2047  . pantoprazole (PROTONIX) EC tablet 40 mg  40 mg Oral Daily Gennaro Africa, MD   40 mg at 03/04/16 0923  . polyethylene glycol (MIRALAX / GLYCOLAX) packet 17 g  17 g Oral Daily Theodis Blaze, MD   17 g at 03/04/16 [id number]  . potassium chloride SA (K-DUR,KLOR-CON) CR tablet 40 mEq  40 mEq Oral Daily Gennaro Africa, MD   40 mEq at 03/04/16 0923  . predniSONE (DELTASONE) tablet 50 mg  50 mg Oral Q breakfast Theodis Blaze, MD   50 mg  at 03/04/16 [id number]  . senna-docusate (Senokot-S) tablet 1 tablet  1 tablet Oral BID Theodis Blaze, MD   1 tablet at 03/04/16 [phone]  . tiotropium (SPIRIVA) inhalation capsule 18 mcg  18 mcg Inhalation Daily Gennaro Africa, MD   18 mcg at 03/04/16 0727  . torsemide (DEMADEX) tablet 40 mg  40 mg Oral BID Satira Mccallum Tillery, PA-C   40 mg at 03/04/16 0817  . traZODone (DESYREL) tablet 25 mg  25 mg Oral QHS Gennaro Africa, MD   25 mg at 03/03/16 2046     Discharge Medications: Please see discharge summary for a list of discharge medications.  Relevant Imaging Results:  Relevant Lab Results:   Additional Information SS#: [id number]  Candie Chroman, LCSW

## 2016-03-04 NOTE — Discharge Summary (Signed)
Physician Discharge Summary  Christopher Holder [id number] DOB: [birth year] DOA: 02/26/2016  PCP: Mackie Pai, PA-C  Admit date: 02/26/2016 Discharge date: 03/04/2016   Recommendations and Orders:  1. Follow up with cardiology heart failure clinic as scheduled 2. Please arrange Hospice at ALF   Dscharge Condition: Stable   Brief/Interim Summary:   Patient is a 82 yo male with known chronic combined CHF and pulmonary fibrosis, EF 20-25%, presented to [facility] ED with main concern of progressive dyspnea with exertion and at rest.   Assessment & Plan:  Acute hypoxic respiratory failure due to acute on chronic combined CHF, pulmonary fibrosis, AECOPD, acute LLL and RUL CAP, unknown pathogen  - 2D echo EF 20-25% in the past month so no need to repeat new ECHO - improved diuresis  - still on Lasix IV 80 mg BID, dose adjustment per cardiology  - weight 120 kg --> 121 kg --> 117 kg --> 115 kg this AM - plan to d/c 03/04/16.   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  - hold home glipizide while inpatient - Monitor blood glucose closely outpatient   CKD stage 3 - review of records indicate GFR in 40's with baseline Cr ~ 1.4 - Cr trend: 1.3 --> 1.54 --> 1.48 --> 1.36 --> 1.49 --> 1.37  PAF, CHADS2 score 5 - continue Coreg, Eliquis - rate controlled for now   Hypokalemia due to lasix use - supplemented and remains WNL this AM   HTN, essential  - continue home medications   CAD - stable no chest pain, continue current regimen - trop elevated, flat   Morbid obesity due to excess calories - pt meets criteria with BMI > 35 and underlying risks of HTN, DM -  Body mass index is 36.01 kg/(m^2)  DVT prophylaxis: Eliquis  Family Communication: Updated at bedside  Consultants:   Cardiology - Heart failure service  Procedures:   None  Antimicrobials:   Doxycycline 6/9 -->03/04/16  Discharge Diagnoses:  Active Problems:   CHF (congestive heart failure) (HCC)   Heart failure (HCC)   Acute on chronic systolic congestive heart failure (HCC)   Chronic obstructive pulmonary disease (HCC)   Palliative care encounter   Dyspnea   Pulmonary fibrosis (HCC)   NSVT (nonsustained ventricular tachycardia) (HCC)   Constipation  Discharge Instructions     Discharge Instructions    Diet - low sodium heart healthy    Complete by:  As directed      Discharge instructions    Complete by:  As directed   Please arrange for hospice at ALF Follow up with heart failure clinic as scheduled. Monitor blood glucose at least 2 times daily.     Increase activity slowly    Complete by:  As directed             Medication List    STOP taking these medications        furosemide 40 MG tablet  Commonly known as:  LASIX     guaiFENesin 100 MG/5ML liquid  Commonly known as:  ROBITUSSIN      TAKE these medications        albuterol 108 (90  Base) MCG/ACT inhaler  Commonly known as:  PROVENTIL HFA;VENTOLIN HFA  Inhale 2 puffs into the lungs every 8 (eight) hours.     allopurinol 100 MG tablet  Commonly known as:  ZYLOPRIM  Take 1 tablet by mouth two  times daily     aspirin 81 MG EC tablet  Take 1 tablet (81 mg total) by mouth daily.     atorvastatin 10 MG tablet  Commonly known as:  LIPITOR  Take 10 mg by mouth at bedtime.     carvedilol 3.125 MG tablet  Commonly known as:  COREG  1 tab po bid     DULERA 200-5 MCG/ACT Aero  Generic drug:  mometasone-formoterol  Use 2 puffs two times daily     ELIQUIS 5 MG Tabs tablet  Generic drug:  apixaban  Take 1 tablet (5 mg total) by mouth 2 (two) times daily.     fluticasone 50 MCG/ACT nasal  spray  Commonly known as:  FLONASE  Use 2 sprays in each  nostril daily     glipiZIDE 5 MG tablet  Commonly known as:  GLUCOTROL  Take 1 tablet (5 mg total) by mouth daily before breakfast. Reported on 12/29/2015     losartan 25 MG tablet  Commonly known as:  COZAAR  Take 25 mg by mouth daily.     montelukast 10 MG tablet  Commonly known as:  SINGULAIR  Take 1 tablet by mouth at  bedtime     NON FORMULARY  CPAP at night (setting is at 15)     omeprazole 20 MG capsule  Commonly known as:  PRILOSEC  Take 1 capsule by mouth  daily     potassium chloride 10 MEQ tablet  Commonly known as:  K-DUR,KLOR-CON  Take 40 mEq by mouth daily.     predniSONE 20 MG tablet  Commonly known as:  DELTASONE  Take 2 tabs po daily for 5 days, then 1 tab po daily for 5 days     tiotropium 18 MCG inhalation capsule  Commonly known as:  SPIRIVA  Place 1 capsule (18 mcg total) into inhaler and inhale daily.     torsemide 20 MG tablet  Commonly known as:  DEMADEX  Take 2 tablets (40 mg total) by mouth 2 (two) times daily.     traZODone 50 MG tablet  Commonly known as:  DESYREL  1/2 tab po q hs       Follow-up Information    Follow up with Glori Bickers, MD On 03/10/2016.   Specialty:  Cardiology   Why:  at 1040 for post hospital follow up. Please bring all of your medications to your visit. the code for parking is 0020.   Contact information:   [place] [phone]      Allergies  Allergen Reactions  . Percocet [Oxycodone-Acetaminophen] Diarrhea, Nausea And Vomiting and Nausea Only  . Sulfa Antibiotics Diarrhea and Nausea And Vomiting   Consultations:  Advanced Heart failure service  Procedures/Studies: Dg Chest 2 View  02/26/2016  CLINICAL DATA:  Shortness of breath and leg swelling EXAM: CHEST  2 VIEW COMPARISON:  Feb 02, 2016 FINDINGS: Cardiomegaly with edema and small effusions. More focal opacity seen in the lateral right lung,  possibly developing infiltrate. Probable atelectasis in the left base. No other changes. IMPRESSION: Cardiomegaly and edema. Focal opacity in the right lateral lung. Recommend follow-up to resolution. Electronically Signed   By: Dorise Bullion III M.D  On: 02/26/2016 19:01   Ct Chest Wo Contrast  02/27/2016  CLINICAL DATA:  Dyspnea, shortness of breath, asthma EXAM: CT CHEST WITHOUT CONTRAST TECHNIQUE: Multidetector CT imaging of the chest was performed following the standard protocol without IV contrast. COMPARISON:  12/16/2015 FINDINGS: Mediastinum/Lymph Nodes: Secretions are noted in mid trachea please see axial image 32. No evidence of airway obstruction. A right lower paratracheal lymph node again noted Measures 1.4 cm short-axis without change from prior exam. Atherosclerotic calcifications of coronary arteries. Cardiomegaly again noted. Mild atherosclerotic calcifications of thoracic aorta. Ascending aorta measures 4.3 cm in diameter without change from prior exam. Lungs/Pleura: There is small to moderate right pleural effusion. Small left pleural effusion. Again noted chronic interstitial coarsening bilaterally. Peripheral reticulation and architectural distortion consistent with fibrotic changes again noted. Findings consistent with pulmonary fibrosis. There is patchy superimposed peripheral ground-glass attenuation in right upper lobe. There is patchy airspace disease in left lower lobe. Findings highly suspicious for superimposed infiltrate/ pneumonia. Follow-up to resolution after treatment is recommended. Upper abdomen: The visualized upper abdomen shows multiple small calcified gallstones within gallbladder the largest measures 5 mm. No adrenal gland mass is noted. There is atrophic pancreas. Visualized unenhanced spleen is unremarkable. Visualized unenhanced liver shows no intrahepatic biliary ductal dilatation. Musculoskeletal: Sagittal images of the spine shows degenerative changes thoracic  spine. Sagittal view of the sternum is unremarkable. No destructive rib lesions are noted. IMPRESSION: 1. Again noted chronic interstitial coarsening bilaterally. Peripheral reticulation and architectural distortion consistent with fibrotic changes again noted. Findings consistent with pulmonary fibrosis. There is patchy superimposed peripheral ground-glass attenuation in right upper lobe. There is patchy airspace disease in left lower lobe. Findings highly suspicious for superimposed infiltrate/ pneumonia. Follow-up to resolution after treatment is recommended. 2. Stable mediastinal adenopathy. 3. Small dependent secretions are noted in mid trachea without evidence of airway obstruction. 4. There is small to moderate right pleural effusion. Small left pleural effusion. 5. Degenerative changes thoracolumbar spine. 6. Calcified gallstones are noted within gallbladder. Electronically Signed   By: Lahoma Crocker M.D.   On: 02/27/2016 19:54    Subjective: Pt says he feels much better, breathing much better, diuresing well.  No complaints.   Discharge Exam: Filed Vitals:   03/03/16 2202 03/04/16 0608  BP: 104/54 100/54  Pulse: 87 74  Temp: 97.9 F (36.6 C) 97.2 F (36.2 C)  Resp: 19 20   Filed Vitals:   03/03/16 2057 03/03/16 2202 03/04/16 0608 03/04/16 0728  BP:  104/54 100/54   Pulse:  87 74   Temp:  97.9 F (36.6 C) 97.2 F (36.2 C)   TempSrc:  Oral Oral   Resp:  19 20   Height:      Weight:   249 lb 9.6 oz (113.218 kg)   SpO2: 96% 95% 98% 98%   General exam: Appears calm and comfortable sitting in chair Respiratory system: improved air movement with mild bibasilar crackles  Cardiovascular system: IRRR. No rubs, gallops or clicks. Bilateral LE edema +1  Gastrointestinal system: Abdomen is nondistended, soft and nontender.  Central nervous system: Alert and oriented. No focal neurological deficits. Extremities:  + 1 bilateral LE edema, chronic venous stasis changes   The results of  significant diagnostics from this hospitalization (including imaging, microbiology, ancillary and laboratory) are listed below for reference.     Microbiology: Recent Results (from the past 240 hour(s))  Culture, expectorated sputum-assessment     Status: None   Collection Time: 02/29/16  9:27 AM  Result  Value Ref Range Status   Specimen Description SPUTUM  Final   Special Requests NONE  Final   Sputum evaluation   Final    THIS SPECIMEN IS ACCEPTABLE. RESPIRATORY CULTURE REPORT TO FOLLOW.   Report Status 02/29/2016 FINAL  Final  Culture, respiratory (NON-Expectorated)     Status: None   Collection Time: 02/29/16  9:27 AM  Result Value Ref Range Status   Specimen Description SPUTUM  Final   Special Requests NONE  Final   Gram Stain   Final    FEW WBC PRESENT, PREDOMINANTLY MONONUCLEAR ABUNDANT GRAM POSITIVE COCCI IN PAIRS ABUNDANT GRAM VARIABLE ROD    Culture Consistent with normal respiratory flora.  Final   Report Status 03/02/2016 FINAL  Final     Labs: BNP (last 3 results)  Recent Labs  12/15/15 2100 02/02/16 1447 02/26/16 1654  BNP 481.6* 604.4* [id number]*   Basic Metabolic Panel:  Recent Labs Lab 02/29/16 0350 03/01/16 0301 03/01/16 1505 03/02/16 0329 03/03/16 0537 03/04/16 0405  NA 138 137  --  138 139 140  K 4.0 4.2  --  3.7 3.6 4.1  CL 98* 98*  --  92* 87* 91*  CO2 31 31  --  36* 42* 43*  GLUCOSE 189* 153*  --  158* 150* 142*  BUN 37* 45*  --  49* 45* 46*  CREATININE 1.48* 1.36*  --  1.49* 1.37* 1.50*  CALCIUM 8.9 8.9  --  9.2 9.3 9.1  MG  --   --  2.3  --   --  2.3   Liver Function Tests:  Recent Labs Lab 02/27/16 0227  AST 24  ALT 25  ALKPHOS 90  BILITOT 0.9  PROT 6.0*  ALBUMIN 3.1*   No results for input(s): LIPASE, AMYLASE in the last 168 hours. No results for input(s): AMMONIA in the last 168 hours. CBC:  Recent Labs Lab 02/29/16 0350 03/01/16 0301 03/02/16 0329 03/03/16 0537 03/04/16 0405  WBC 10.1 9.0 8.4 8.1 10.3  HGB 11.3*  11.1* 11.8* 12.9* 12.8*  HCT 36.3* 35.5* 37.5* 40.8 41.0  MCV 88.5 88.5 87.6 88.1 89.1  PLT 186 174 187 208 190   Cardiac Enzymes:  Recent Labs Lab 02/26/16 2231 02/27/16 0227 02/27/16 1553 02/27/16 2126 02/28/16 0358  TROPONINI 0.03 0.04* 0.06* 0.03 0.03   BNP: Invalid input(s): POCBNP CBG:  Recent Labs Lab 03/03/16 0618 03/03/16 1210 03/03/16 1724 03/03/16 2202 03/04/16 0607  GLUCAP 168* 260* 215* 198* 148*   D-Dimer No results for input(s): DDIMER in the last 72 hours. Hgb A1c No results for input(s): HGBA1C in the last 72 hours. Lipid Profile No results for input(s): CHOL, HDL, LDLCALC, TRIG, CHOLHDL, LDLDIRECT in the last 72 hours. Thyroid function studies No results for input(s): TSH, T4TOTAL, T3FREE, THYROIDAB in the last 72 hours.  Invalid input(s): FREET3 Anemia work up No results for input(s): VITAMINB12, FOLATE, FERRITIN, TIBC, IRON, RETICCTPCT in the last 72 hours. Urinalysis    Component Value Date/Time   COLORURINE YELLOW 12/16/2015 0011   APPEARANCEUR CLEAR 12/16/2015 0011   LABSPEC 1.013 12/16/2015 0011   PHURINE 6.0 12/16/2015 0011   GLUCOSEU NEGATIVE 12/16/2015 0011   HGBUR NEGATIVE 12/16/2015 0011   BILIRUBINUR NEGATIVE 12/16/2015 0011   KETONESUR NEGATIVE 12/16/2015 0011   PROTEINUR NEGATIVE 12/16/2015 0011   NITRITE NEGATIVE 12/16/2015 0011   LEUKOCYTESUR NEGATIVE 12/16/2015 0011   Sepsis Labs Invalid input(s): PROCALCITONIN,  WBC,  LACTICIDVEN Microbiology Recent Results (from the past 240 hour(s))  Culture, expectorated sputum-assessment  Status: None   Collection Time: 02/29/16  9:27 AM  Result Value Ref Range Status   Specimen Description SPUTUM  Final   Special Requests NONE  Final   Sputum evaluation   Final    THIS SPECIMEN IS ACCEPTABLE. RESPIRATORY CULTURE REPORT TO FOLLOW.   Report Status 02/29/2016 FINAL  Final  Culture, respiratory (NON-Expectorated)     Status: None   Collection Time: 02/29/16  9:27 AM  Result  Value Ref Range Status   Specimen Description SPUTUM  Final   Special Requests NONE  Final   Gram Stain   Final    FEW WBC PRESENT, PREDOMINANTLY MONONUCLEAR ABUNDANT GRAM POSITIVE COCCI IN PAIRS ABUNDANT GRAM VARIABLE ROD    Culture Consistent with normal respiratory flora.  Final   Report Status 03/02/2016 FINAL  Final   Time coordinating discharge: 38 minutes  SIGNED:  Irwin Brakeman, MD  [facility] 03/04/2016, 10:17 AM If 7PM-7AM, please contact night-coverage [email or web address] Password [email or web address]

## 2016-03-04 NOTE — Progress Notes (Signed)
Advanced Heart Failure Rounding Note  Referring Physician: Dr. Doyle Askew   PCP: Mackie Pai, PA-C  Primary Cardiologist: Dr. Marlou Porch HF: Dr. Haroldine Laws EP: Dr. Rayann Heman  Subjective:    Admitted 02/26/16 with 2 weeks of DOE + edema  Feeling great this am.  Excited to be leaving hospital.  Denies SOB, lightheadedness, or dizziness. Still having some production with cough.   PT recommending SNF as of 02/28/16. Likely to SNF vs ALF with hospice services in place.    Out 2.8 L and down an additional 6 lbs with IV lasix yesterday.  Creatinine and BUN stable.   Objective:   Weight Range: 249 lb 9.6 oz (113.218 kg) Body mass index is 33.84 kg/(m^2).   Vital Signs:   Temp:  [97.2 F (36.2 C)-98.5 F (36.9 C)] 97.2 F (36.2 C) (06/16 [id number]) Pulse Rate:  [74-87] 74 (06/16 0608) Resp:  [19-20] 20 (06/16 0608) BP: (92-104)/(54-57) 100/54 mmHg (06/16 0608) SpO2:  [95 %-98 %] 98 % (06/16 0728) FiO2 (%):  [28 %] 28 % (06/16 0728) Weight:  [249 lb 9.6 oz (113.218 kg)] 249 lb 9.6 oz (113.218 kg) (06/16 0608) Last BM Date: 03/03/16  Weight change: Filed Weights   03/02/16 0435 03/03/16 0518 03/04/16 0608  Weight: 259 lb 11.2 oz (117.799 kg) 254 lb (115.214 kg) 249 lb 9.6 oz (113.218 kg)    Intake/Output:   Intake/Output Summary (Last 24 hours) at 03/04/16 0749 Last data filed at 03/04/16 [id number]  Gross per 24 hour  Intake    940 ml  Output   2650 ml  Net  -1710 ml     Physical Exam: General: Elderly appearing. Less dyspnea. HEENT: normal Neck: supple. JVP 6-7. Carotids 2+ bilaterally; no bruits. No thyromegaly or nodule noted.  Cor: PMI normal. Irregular rate & rhythm. No M/G/R Lungs: Decreased throughout, no wheezes/rales/rhonchi. Abdomen: obese, soft, non-tender, non-distended, no HSM. No bruits or masses. +BS  Extremities: no cyanosis, clubbing, rash, trace-1+ edema Neuro: alert & orientedx3, cranial nerves grossly intact. Moves all 4 extremities w/o difficulty. Affect  pleasant.  Telemetry: reviewed personally, Afib 80-90s  Labs: CBC  Recent Labs  03/03/16 0537 03/04/16 0405  WBC 8.1 10.3  HGB 12.9* 12.8*  HCT 40.8 41.0  MCV 88.1 89.1  PLT 208 [id number]   Basic Metabolic Panel  Recent Labs  03/01/16 1505  03/03/16 0537 03/04/16 0405  NA  --   < > 139 140  K  --   < > 3.6 4.1  CL  --   < > 87* 91*  CO2  --   < > 42* 43*  GLUCOSE  --   < > 150* 142*  BUN  --   < > 45* 46*  CREATININE  --   < > 1.37* 1.50*  CALCIUM  --   < > 9.3 9.1  MG 2.3  --   --   --   < > = values in this interval not displayed. Liver Function Tests No results for input(s): AST, ALT, ALKPHOS, BILITOT, PROT, ALBUMIN in the last 72 hours. No results for input(s): LIPASE, AMYLASE in the last 72 hours. Cardiac Enzymes No results for input(s): CKTOTAL, CKMB, CKMBINDEX, TROPONINI in the last 72 hours.  BNP: BNP (last 3 results)  Recent Labs  12/15/15 2100 02/02/16 1447 02/26/16 1654  BNP 481.6* 604.4* 999.3*    ProBNP (last 3 results)  Recent Labs  10/06/15 1153 10/21/15 1106 01/05/16 1148  PROBNP 474.0* 318.0* 538.0*  D-Dimer No results for input(s): DDIMER in the last 72 hours. Hemoglobin A1C No results for input(s): HGBA1C in the last 72 hours. Fasting Lipid Panel No results for input(s): CHOL, HDL, LDLCALC, TRIG, CHOLHDL, LDLDIRECT in the last 72 hours. Thyroid Function Tests No results for input(s): TSH, T4TOTAL, T3FREE, THYROIDAB in the last 72 hours.  Invalid input(s): FREET3  Other results:     Imaging/Studies:  No results found.  Latest Echo  Latest Cath   Medications:     Scheduled Medications: . allopurinol  100 mg Oral BID  . apixaban  5 mg Oral BID  . aspirin EC  81 mg Oral Daily  . atorvastatin  10 mg Oral QHS  . carvedilol  3.125 mg Oral BID WC  . doxycycline  100 mg Oral Q12H  . fluticasone  2 spray Each Nare Daily  . guaiFENesin  600 mg Oral BID  . insulin aspart  0-9 Units Subcutaneous TID WC  . losartan   25 mg Oral Daily  . mometasone-formoterol  2 puff Inhalation BID  . montelukast  10 mg Oral QHS  . pantoprazole  40 mg Oral Daily  . polyethylene glycol  17 g Oral Daily  . potassium chloride  40 mEq Oral Daily  . predniSONE  50 mg Oral Q breakfast  . senna-docusate  1 tablet Oral BID  . sodium chloride flush  3 mL Intravenous Q12H  . tiotropium  18 mcg Inhalation Daily  . torsemide  40 mg Oral BID  . traZODone  25 mg Oral QHS    Infusions:    PRN Medications: sodium chloride, guaiFENesin, ipratropium-albuterol, sodium chloride flush   Assessment   1. Dyspnea - multifactorial 2. A/C systolic HF 3. Atrial fibriliation, permanent 4. CAD s/p D1 stent - Patent LHC 2017 5. HTN 6. HLD 7. CKD stage III 8. COPD 9. OSA with CPAP 10. Pulmonary fibrosis 11. NSVT  Plan    Dyspnea much improved with diuresis.   Transition to torsemide 40 mg BID. JVP 6-7.  Peeing well this am (300-400 cc in bedside urinal)  14 beats of NSVT this am.  Asymptomatic, has been happening in mornings.  K stable, will recheck Mg with notable diuresis these past several days.   Lungs sound much more clear.   CAD stable without ACS. Continue nightly CPAP for OSA.   OK for d/c today from cardiology perspective. He has close follow up with HF clinic next week.   HF meds for d/c Torsemide 40 mg BID Potassium 40 meq daily Coreg 3.125 mg BID Losartan 25 mg daily Atorvastatin 10 mg qhs ASA 81 mg daily  Length of Stay: 6  Shirley Friar PA-C 03/04/2016, 7:49 AM  Advanced Heart Failure Team Pager [phone] (M-F; 7a - 4p)  Please contact [facility] for night-coverage after hours (4p -7a ) and weekends on [email or web address]  Patient seen and examined with Oda Kilts, PA-C. We discussed all aspects of the encounter. I agree with the assessment and plan as stated above.   Volume status much improved. He is ready for d/c today. Agree with switch to po demadex. F/u in HF Clinic next week. May need  paramedicine.   Alicea Wente,MD 9:05 AM

## 2016-03-04 NOTE — Progress Notes (Signed)
Pt has orders to be discharged to [facility]. Discharge instructions given and pt has no additional questions at this time. Medication regimen reviewed and pt educated. Pt verbalized understanding and has no additional questions. Telemetry box removed. IV removed and site in good condition. Pt stable and waiting for transportation.   Maurene Capes RN

## 2016-03-04 NOTE — Clinical Social Work Note (Signed)
CSW facilitated patient discharge including contacting patient family and facility to confirm patient discharge plans. Clinical information faxed to facility and family agreeable with plan. Patient's granddaughter to call transport to [facility]. RN to call report prior to discharge [phone]).  CSW will sign off for now as social work intervention is no longer needed. Please consult [place] again if new needs arise.  Christopher Holder, Christopher Holder

## 2016-03-07 DIAGNOSIS — R262 Difficulty in walking, not elsewhere classified: Secondary | ICD-10-CM | POA: Diagnosis not present

## 2016-03-07 DIAGNOSIS — M6281 Muscle weakness (generalized): Secondary | ICD-10-CM | POA: Diagnosis not present

## 2016-03-07 DIAGNOSIS — G4733 Obstructive sleep apnea (adult) (pediatric): Secondary | ICD-10-CM | POA: Diagnosis not present

## 2016-03-07 LAB — QUANTIFERON IN TUBE
QFT TB AG MINUS NIL VALUE: <0 IU/mL
QUANTIFERON MITOGEN VALUE: 0.66 [IU]/mL
QUANTIFERON NIL VALUE: 0.16 [IU]/mL
QUANTIFERON TB AG VALUE: 0.13 IU/mL
QUANTIFERON TB GOLD: NEGATIVE

## 2016-03-07 LAB — QUANTIFERON TB GOLD ASSAY (BLOOD)

## 2016-03-08 DIAGNOSIS — I5020 Unspecified systolic (congestive) heart failure: Secondary | ICD-10-CM | POA: Diagnosis not present

## 2016-03-08 DIAGNOSIS — J449 Chronic obstructive pulmonary disease, unspecified: Secondary | ICD-10-CM | POA: Diagnosis not present

## 2016-03-08 DIAGNOSIS — I509 Heart failure, unspecified: Secondary | ICD-10-CM | POA: Diagnosis not present

## 2016-03-08 DIAGNOSIS — Z66 Do not resuscitate: Secondary | ICD-10-CM | POA: Diagnosis not present

## 2016-03-08 DIAGNOSIS — I10 Essential (primary) hypertension: Secondary | ICD-10-CM | POA: Diagnosis not present

## 2016-03-08 LAB — QUANTIFERON IN TUBE
QFT TB AG MINUS NIL VALUE: <0 IU/mL
QUANTIFERON MITOGEN VALUE: 0.16 IU/mL
QUANTIFERON TB AG VALUE: 0.02 IU/mL
QUANTIFERON TB GOLD: UNDETERMINED
Quantiferon Nil Value: 0.06 IU/mL

## 2016-03-08 LAB — QUANTIFERON TB GOLD ASSAY (BLOOD)

## 2016-03-10 ENCOUNTER — Encounter (HOSPITAL_COMMUNITY): Payer: Medicare Other

## 2016-03-11 ENCOUNTER — Ambulatory Visit: Payer: Medicare Other | Admitting: Emergency Medicine

## 2016-03-14 ENCOUNTER — Other Ambulatory Visit: Payer: Self-pay | Admitting: *Deleted

## 2016-03-14 ENCOUNTER — Encounter: Payer: Self-pay | Admitting: *Deleted

## 2016-03-14 NOTE — Patient Outreach (Signed)
[place] [facility]) Care Management  03/14/2016  Christopher Holder 03-25-1930 [id number]   Pt readmitted and discharged on 6/16 with plans to involved Hospice at the ALF. Pt no longer eligible for [facility] services and will be discharged at this time. RN has alerted the involved social worker of case closure. No further services will be provided at this time.  RN will also send case closure letter to the provider.  Raina Mina, RN Care Management Coordinator [place] Office [phone]

## 2016-03-15 ENCOUNTER — Telehealth: Payer: Self-pay | Admitting: *Deleted

## 2016-03-15 DIAGNOSIS — I509 Heart failure, unspecified: Secondary | ICD-10-CM | POA: Diagnosis not present

## 2016-03-15 DIAGNOSIS — I10 Essential (primary) hypertension: Secondary | ICD-10-CM | POA: Diagnosis not present

## 2016-03-15 DIAGNOSIS — Z66 Do not resuscitate: Secondary | ICD-10-CM | POA: Diagnosis not present

## 2016-03-15 DIAGNOSIS — J449 Chronic obstructive pulmonary disease, unspecified: Secondary | ICD-10-CM | POA: Diagnosis not present

## 2016-03-15 NOTE — Telephone Encounter (Signed)
I called patient for 52-month phone call for [email or web address]  Study. I spoke to granddaughter and patient has been re-hospitalized 2 times for congestive heart failure. I asked her to thank patient for his participation in the study.

## 2016-03-16 DIAGNOSIS — Z79899 Other long term (current) drug therapy: Secondary | ICD-10-CM | POA: Diagnosis not present

## 2016-03-16 DIAGNOSIS — E785 Hyperlipidemia, unspecified: Secondary | ICD-10-CM | POA: Diagnosis not present

## 2016-03-17 DIAGNOSIS — G309 Alzheimer's disease, unspecified: Secondary | ICD-10-CM | POA: Diagnosis not present

## 2016-03-17 DIAGNOSIS — F0280 Dementia in other diseases classified elsewhere without behavioral disturbance: Secondary | ICD-10-CM | POA: Diagnosis not present

## 2016-03-28 ENCOUNTER — Emergency Department (HOSPITAL_COMMUNITY)

## 2016-03-28 ENCOUNTER — Encounter (HOSPITAL_COMMUNITY): Payer: Self-pay | Admitting: *Deleted

## 2016-03-28 ENCOUNTER — Inpatient Hospital Stay (HOSPITAL_COMMUNITY)
Admission: EM | Admit: 2016-03-28 | Discharge: 2016-04-02 | DRG: 308 | Disposition: A | Discharge status: Skilled Nursing Facility | Attending: Internal Medicine | Admitting: Internal Medicine

## 2016-03-28 DIAGNOSIS — M109 Gout, unspecified: Secondary | ICD-10-CM | POA: Diagnosis present

## 2016-03-28 DIAGNOSIS — Z885 Allergy status to narcotic agent status: Secondary | ICD-10-CM

## 2016-03-28 DIAGNOSIS — I471 Supraventricular tachycardia: Secondary | ICD-10-CM | POA: Diagnosis present

## 2016-03-28 DIAGNOSIS — Z7901 Long term (current) use of anticoagulants: Secondary | ICD-10-CM

## 2016-03-28 DIAGNOSIS — Z955 Presence of coronary angioplasty implant and graft: Secondary | ICD-10-CM

## 2016-03-28 DIAGNOSIS — E785 Hyperlipidemia, unspecified: Secondary | ICD-10-CM | POA: Diagnosis present

## 2016-03-28 DIAGNOSIS — R0602 Shortness of breath: Secondary | ICD-10-CM | POA: Diagnosis not present

## 2016-03-28 DIAGNOSIS — E669 Obesity, unspecified: Secondary | ICD-10-CM | POA: Diagnosis present

## 2016-03-28 DIAGNOSIS — I509 Heart failure, unspecified: Secondary | ICD-10-CM | POA: Diagnosis not present

## 2016-03-28 DIAGNOSIS — J449 Chronic obstructive pulmonary disease, unspecified: Secondary | ICD-10-CM | POA: Diagnosis present

## 2016-03-28 DIAGNOSIS — N183 Chronic kidney disease, stage 3 unspecified: Secondary | ICD-10-CM | POA: Diagnosis present

## 2016-03-28 DIAGNOSIS — J9620 Acute and chronic respiratory failure, unspecified whether with hypoxia or hypercapnia: Secondary | ICD-10-CM | POA: Diagnosis present

## 2016-03-28 DIAGNOSIS — I130 Hypertensive heart and chronic kidney disease with heart failure and stage 1 through stage 4 chronic kidney disease, or unspecified chronic kidney disease: Secondary | ICD-10-CM | POA: Diagnosis present

## 2016-03-28 DIAGNOSIS — Z515 Encounter for palliative care: Secondary | ICD-10-CM | POA: Diagnosis present

## 2016-03-28 DIAGNOSIS — Z96659 Presence of unspecified artificial knee joint: Secondary | ICD-10-CM | POA: Diagnosis present

## 2016-03-28 DIAGNOSIS — I5023 Acute on chronic systolic (congestive) heart failure: Secondary | ICD-10-CM | POA: Diagnosis not present

## 2016-03-28 DIAGNOSIS — I2510 Atherosclerotic heart disease of native coronary artery without angina pectoris: Secondary | ICD-10-CM | POA: Diagnosis present

## 2016-03-28 DIAGNOSIS — E119 Type 2 diabetes mellitus without complications: Secondary | ICD-10-CM

## 2016-03-28 DIAGNOSIS — J438 Other emphysema: Secondary | ICD-10-CM | POA: Diagnosis not present

## 2016-03-28 DIAGNOSIS — K219 Gastro-esophageal reflux disease without esophagitis: Secondary | ICD-10-CM | POA: Diagnosis present

## 2016-03-28 DIAGNOSIS — Z882 Allergy status to sulfonamides status: Secondary | ICD-10-CM | POA: Diagnosis not present

## 2016-03-28 DIAGNOSIS — E1140 Type 2 diabetes mellitus with diabetic neuropathy, unspecified: Secondary | ICD-10-CM | POA: Diagnosis present

## 2016-03-28 DIAGNOSIS — I481 Persistent atrial fibrillation: Secondary | ICD-10-CM | POA: Diagnosis not present

## 2016-03-28 DIAGNOSIS — J189 Pneumonia, unspecified organism: Secondary | ICD-10-CM | POA: Diagnosis not present

## 2016-03-28 DIAGNOSIS — I959 Hypotension, unspecified: Secondary | ICD-10-CM | POA: Diagnosis present

## 2016-03-28 DIAGNOSIS — G4733 Obstructive sleep apnea (adult) (pediatric): Secondary | ICD-10-CM | POA: Diagnosis present

## 2016-03-28 DIAGNOSIS — I10 Essential (primary) hypertension: Secondary | ICD-10-CM | POA: Diagnosis present

## 2016-03-28 DIAGNOSIS — Z7984 Long term (current) use of oral hypoglycemic drugs: Secondary | ICD-10-CM

## 2016-03-28 DIAGNOSIS — Z79899 Other long term (current) drug therapy: Secondary | ICD-10-CM

## 2016-03-28 DIAGNOSIS — E1122 Type 2 diabetes mellitus with diabetic chronic kidney disease: Secondary | ICD-10-CM | POA: Diagnosis present

## 2016-03-28 DIAGNOSIS — E1121 Type 2 diabetes mellitus with diabetic nephropathy: Secondary | ICD-10-CM | POA: Diagnosis present

## 2016-03-28 DIAGNOSIS — I4892 Unspecified atrial flutter: Secondary | ICD-10-CM | POA: Diagnosis present

## 2016-03-28 DIAGNOSIS — E0842 Diabetes mellitus due to underlying condition with diabetic polyneuropathy: Secondary | ICD-10-CM

## 2016-03-28 DIAGNOSIS — J8410 Pulmonary fibrosis, unspecified: Secondary | ICD-10-CM | POA: Diagnosis present

## 2016-03-28 DIAGNOSIS — I519 Heart disease, unspecified: Secondary | ICD-10-CM | POA: Diagnosis not present

## 2016-03-28 DIAGNOSIS — H9190 Unspecified hearing loss, unspecified ear: Secondary | ICD-10-CM | POA: Diagnosis present

## 2016-03-28 DIAGNOSIS — Z7982 Long term (current) use of aspirin: Secondary | ICD-10-CM

## 2016-03-28 DIAGNOSIS — Z9989 Dependence on other enabling machines and devices: Secondary | ICD-10-CM

## 2016-03-28 DIAGNOSIS — I252 Old myocardial infarction: Secondary | ICD-10-CM

## 2016-03-28 DIAGNOSIS — I480 Paroxysmal atrial fibrillation: Secondary | ICD-10-CM | POA: Diagnosis present

## 2016-03-28 DIAGNOSIS — I429 Cardiomyopathy, unspecified: Secondary | ICD-10-CM | POA: Insufficient documentation

## 2016-03-28 DIAGNOSIS — I4891 Unspecified atrial fibrillation: Secondary | ICD-10-CM | POA: Diagnosis present

## 2016-03-28 DIAGNOSIS — Z6833 Body mass index (BMI) 33.0-33.9, adult: Secondary | ICD-10-CM

## 2016-03-28 DIAGNOSIS — I4819 Other persistent atrial fibrillation: Secondary | ICD-10-CM | POA: Diagnosis present

## 2016-03-28 DIAGNOSIS — D638 Anemia in other chronic diseases classified elsewhere: Secondary | ICD-10-CM | POA: Diagnosis present

## 2016-03-28 DIAGNOSIS — R0600 Dyspnea, unspecified: Secondary | ICD-10-CM | POA: Diagnosis present

## 2016-03-28 LAB — GLUCOSE, CAPILLARY
GLUCOSE-CAPILLARY: 171 mg/dL — AB (ref 65–99)
Glucose-Capillary: 262 mg/dL — ABNORMAL HIGH (ref 65–99)
Glucose-Capillary: 120 mg/dL — ABNORMAL HIGH (ref 65–99)

## 2016-03-28 LAB — CBC
HEMATOCRIT: 31.3 % — AB (ref 39.0–52.0)
Hemoglobin: 10.4 g/dL — ABNORMAL LOW (ref 13.0–17.0)
MCH: 29.4 pg (ref 26.0–34.0)
MCHC: 33.2 g/dL (ref 30.0–36.0)
MCV: 88.4 fL (ref 78.0–100.0)
Platelets: 416 10*3/uL — ABNORMAL HIGH (ref 150–400)
RBC: 3.54 MIL/uL — ABNORMAL LOW (ref 4.22–5.81)
RDW: 17.7 % — AB (ref 11.5–15.5)
WBC: 9.3 10*3/uL (ref 4.0–10.5)

## 2016-03-28 LAB — BASIC METABOLIC PANEL
Anion gap: 10 (ref 5–15)
BUN: 24 mg/dL — AB (ref 6–20)
CHLORIDE: 95 mmol/L — AB (ref 101–111)
CO2: 29 mmol/L (ref 22–32)
Calcium: 8.3 mg/dL — ABNORMAL LOW (ref 8.9–10.3)
Creatinine, Ser: 1.51 mg/dL — ABNORMAL HIGH (ref 0.61–1.24)
GFR calc Af Amer: 47 mL/min — ABNORMAL LOW (ref >60)
GFR calc non Af Amer: 41 mL/min — ABNORMAL LOW (ref >60)
GLUCOSE: 139 mg/dL — AB (ref 65–99)
POTASSIUM: 5.2 mmol/L — AB (ref 3.5–5.1)
Sodium: 134 mmol/L — ABNORMAL LOW (ref 135–145)

## 2016-03-28 LAB — I-STAT TROPONIN, ED: Troponin i, poc: 0.03 ng/mL (ref 0.00–0.08)

## 2016-03-28 LAB — MAGNESIUM: Magnesium: 2.1 mg/dL (ref 1.7–2.4)

## 2016-03-28 MED ORDER — TORSEMIDE 20 MG PO TABS
40.0000 mg | ORAL_TABLET | Freq: Two times a day (BID) | ORAL | Status: DC
  Administered 2016-03-28 – 2016-04-01 (×10): 40 mg via ORAL
  Filled 2016-03-28 (×11): qty 2

## 2016-03-28 MED ORDER — ASPIRIN EC 81 MG PO TBEC
81.0000 mg | DELAYED_RELEASE_TABLET | Freq: Every day | ORAL | Status: DC
  Administered 2016-03-28 – 2016-03-31 (×4): 81 mg via ORAL
  Filled 2016-03-28 (×4): qty 1

## 2016-03-28 MED ORDER — TRAZODONE HCL 50 MG PO TABS
50.0000 mg | ORAL_TABLET | Freq: Every evening | ORAL | Status: DC | PRN
  Administered 2016-03-29: 50 mg via ORAL
  Filled 2016-03-28: qty 1

## 2016-03-28 MED ORDER — ALBUTEROL SULFATE HFA 108 (90 BASE) MCG/ACT IN AERS: 2.0000 | INHALATION_SPRAY | Freq: Three times a day (TID) | RESPIRATORY_TRACT | Status: DC

## 2016-03-28 MED ORDER — INSULIN ASPART 100 UNIT/ML ~~LOC~~ SOLN
0.0000 [IU] | Freq: Three times a day (TID) | SUBCUTANEOUS | Status: DC
  Administered 2016-03-28: 5 [IU] via SUBCUTANEOUS
  Administered 2016-03-29: 2 [IU] via SUBCUTANEOUS
  Administered 2016-03-29 – 2016-03-30 (×2): 1 [IU] via SUBCUTANEOUS
  Administered 2016-03-30: 5 [IU] via SUBCUTANEOUS
  Administered 2016-03-31: 1 [IU] via SUBCUTANEOUS
  Administered 2016-03-31: 2 [IU] via SUBCUTANEOUS
  Administered 2016-03-31 – 2016-04-01 (×2): 1 [IU] via SUBCUTANEOUS
  Administered 2016-04-01 – 2016-04-02 (×2): 2 [IU] via SUBCUTANEOUS

## 2016-03-28 MED ORDER — METOPROLOL TARTRATE 5 MG/5ML IV SOLN
INTRAVENOUS | Status: AC
  Filled 2016-03-28: qty 5

## 2016-03-28 MED ORDER — SODIUM CHLORIDE 0.9 % IV BOLUS (SEPSIS)
250.0000 mL | Freq: Once | INTRAVENOUS | Status: AC
  Administered 2016-03-28: 250 mL via INTRAVENOUS

## 2016-03-28 MED ORDER — ATORVASTATIN CALCIUM 10 MG PO TABS
10.0000 mg | ORAL_TABLET | Freq: Every day | ORAL | Status: DC
  Administered 2016-03-28 – 2016-04-01 (×5): 10 mg via ORAL
  Filled 2016-03-28 (×5): qty 1

## 2016-03-28 MED ORDER — ALLOPURINOL 100 MG PO TABS
100.0000 mg | ORAL_TABLET | Freq: Two times a day (BID) | ORAL | Status: DC
  Administered 2016-03-28 – 2016-04-02 (×11): 100 mg via ORAL
  Filled 2016-03-28 (×11): qty 1

## 2016-03-28 MED ORDER — ETOMIDATE 2 MG/ML IV SOLN
10.0000 mg | Freq: Once | INTRAVENOUS | Status: AC
  Administered 2016-03-28: 10 mg via INTRAVENOUS
  Filled 2016-03-28: qty 10

## 2016-03-28 MED ORDER — ACETAMINOPHEN 650 MG RE SUPP: 650.0000 mg | Freq: Four times a day (QID) | RECTAL | Status: DC | PRN

## 2016-03-28 MED ORDER — GUAIFENESIN-DM 100-10 MG/5ML PO SYRP
5.0000 mL | ORAL_SOLUTION | ORAL | Status: DC | PRN
  Administered 2016-03-28 – 2016-04-01 (×6): 5 mL via ORAL
  Filled 2016-03-28 (×6): qty 5

## 2016-03-28 MED ORDER — ACETAMINOPHEN 325 MG PO TABS: 650.0000 mg | ORAL_TABLET | Freq: Four times a day (QID) | ORAL | Status: DC | PRN

## 2016-03-28 MED ORDER — MOMETASONE FURO-FORMOTEROL FUM 200-5 MCG/ACT IN AERO
2.0000 | INHALATION_SPRAY | Freq: Two times a day (BID) | RESPIRATORY_TRACT | Status: DC
  Administered 2016-03-28 – 2016-04-02 (×11): 2 via RESPIRATORY_TRACT
  Filled 2016-03-28: qty 8.8

## 2016-03-28 MED ORDER — TIOTROPIUM BROMIDE MONOHYDRATE 18 MCG IN CAPS
18.0000 ug | ORAL_CAPSULE | Freq: Every day | RESPIRATORY_TRACT | Status: DC
  Administered 2016-03-28 – 2016-04-01 (×5): 18 ug via RESPIRATORY_TRACT
  Filled 2016-03-28: qty 5

## 2016-03-28 MED ORDER — PHENOL 1.4 % MT LIQD
1.0000 | OROMUCOSAL | Status: DC | PRN
  Administered 2016-03-28: 1 via OROMUCOSAL
  Filled 2016-03-28: qty 177

## 2016-03-28 MED ORDER — APIXABAN 5 MG PO TABS
5.0000 mg | ORAL_TABLET | Freq: Two times a day (BID) | ORAL | Status: DC
  Administered 2016-03-28: 5 mg via ORAL
  Filled 2016-03-28: qty 1

## 2016-03-28 MED ORDER — METOPROLOL TARTRATE 5 MG/5ML IV SOLN
5.0000 mg | Freq: Once | INTRAVENOUS | Status: AC
  Administered 2016-03-28: 5 mg via INTRAVENOUS

## 2016-03-28 MED ORDER — POTASSIUM CHLORIDE CRYS ER 20 MEQ PO TBCR
40.0000 meq | EXTENDED_RELEASE_TABLET | Freq: Every day | ORAL | Status: DC
  Administered 2016-03-28 – 2016-04-02 (×5): 40 meq via ORAL
  Filled 2016-03-28 (×5): qty 2

## 2016-03-28 MED ORDER — ALBUTEROL SULFATE (2.5 MG/3ML) 0.083% IN NEBU
2.5000 mg | INHALATION_SOLUTION | Freq: Three times a day (TID) | RESPIRATORY_TRACT | Status: DC
  Administered 2016-03-28 (×3): 2.5 mg via RESPIRATORY_TRACT
  Filled 2016-03-28 (×3): qty 3

## 2016-03-28 MED ORDER — APIXABAN 2.5 MG PO TABS
2.5000 mg | ORAL_TABLET | Freq: Two times a day (BID) | ORAL | Status: DC
  Administered 2016-03-28 – 2016-04-02 (×10): 2.5 mg via ORAL
  Filled 2016-03-28 (×10): qty 1

## 2016-03-28 MED ORDER — PANTOPRAZOLE SODIUM 40 MG PO TBEC
40.0000 mg | DELAYED_RELEASE_TABLET | Freq: Every day | ORAL | Status: DC
  Administered 2016-03-28 – 2016-04-02 (×6): 40 mg via ORAL
  Filled 2016-03-28 (×6): qty 1

## 2016-03-28 MED ORDER — MONTELUKAST SODIUM 10 MG PO TABS
10.0000 mg | ORAL_TABLET | Freq: Every day | ORAL | Status: DC
  Administered 2016-03-28 – 2016-04-01 (×5): 10 mg via ORAL
  Filled 2016-03-28 (×5): qty 1

## 2016-03-28 MED ORDER — INSULIN ASPART 100 UNIT/ML ~~LOC~~ SOLN
0.0000 [IU] | Freq: Every day | SUBCUTANEOUS | Status: DC
  Administered 2016-03-29 – 2016-03-30 (×2): 2 [IU] via SUBCUTANEOUS

## 2016-03-28 NOTE — Consult Note (Signed)
CARDIOLOGY CONSULT NOTE   Patient ID: Christopher Holder MRN: [id number], DOB/AGE: 80/23/1931   Admit date: 03/28/2016 Date of Consult: 03/28/2016   Primary Physician: Mackie Pai, PA-C Primary Cardiologist: Dr. Marlou Porch Primary Electrophysiologist: Dr. Rayann Heman Heart Failure: Dr. Haroldine Laws  Referred by Dr. Tressa Busman  Pt. Profile  Christopher Holder is a chronically ill 82 year old male with past medical history of chronic systolic heart failure with EF 25-30%, NICM out of proportion given underlying CAD, CAD, post inflammatory pulmonary fibrosis following PNA with ARDS in 08/2015, HTN, HLD, DM, stage 3 CKD, OSA on CPAP, and paroxysmal afib on low dose eliquis recently discharged to ALF with hospice came back with recurent SOB and chest pressure.   Problem List  Past Medical History  Diagnosis Date  . Diabetes mellitus without complication ([employer])   . CHF (congestive heart failure) ([place])   . Coronary artery disease   . COPD (chronic obstructive pulmonary disease) ([place])   . Asthma   . Hypertension   . HOH (hard of hearing)   . Sleep apnea   . Myocardial infarction ([place])   . Hyperlipidemia 05/27/2015  . GERD (gastroesophageal reflux disease) 05/27/2015  . AKI (acute kidney injury) ([place]) 08/18/2015  . Arthritis   . Pulmonary fibrosis [facility])     Past Surgical History  Procedure Laterality Date  . Coronary angioplasty with stent placement    . Knee surgery    . Total knee arthroplasty      Knee replacement x2  . Cardioversion [name] 08/28/2015    Procedure: CARDIOVERSION;  Surgeon: Jerline Pain, MD;  Location: [facility];  Service: Cardiovascular;  Laterality: [name];  . Cardiac catheterization [name] 10/02/2015    Procedure: Left Heart Cath and Coronary Angiography;  Surgeon: Leonie Man, MD;  Location: [facility];  Service: Cardiovascular;  Laterality: [name];  . Joint replacement       Allergies  Allergies  Allergen Reactions  . Percocet [Oxycodone-Acetaminophen] Diarrhea, Nausea And  Vomiting and Nausea Only  . Sulfa Antibiotics Diarrhea and Nausea And Vomiting    HPI   Christopher Holder is a chronically ill 82 year old male with past medical history of chronic systolic heart failure with EF 25-30%, NICM out of proportion given underlying CAD, CAD, post inflammatory pulmonary fibrosis following PNA with ARDS in 08/2015, HTN, HLD, DM, stage 3 CKD, OSA on CPAP, and paroxysmal afib on low dose eliquis. Given QRS of only 120 to 130s, Dr. Rayann Heman felt he likely would not benefit from CRT therapy in January 2017. Her last cardiac catheterization on 10/02/2015 showed normal coronaries, widely patent D1 stent, no culprit lesion was found despite having a high risk Myoview on 1/6. We were unable to add amiodarone for afib given his pulmonary fibrosis. He has had 4 admissions in last 6 months with pneumonia, COPD and heart failure. Most recent admission was from 6/9-6/16. He was seen by heart failure service during that hospitalization, he underwent IV diuresis, discharge weight was 254 pounds. He was transitioned to torsemide 40 mg twice a day. He was eventually discharged [facility] facility with hospice.  According to the patient, he continued to have weakness, cough and shortness of breath after he went to assisted living facility. His symptom suddenly got worse this morning having significant shortness of breath and chest tightness prompting the facility staff to call 911. On arrival to [facility] ED, he was noted to be in atrial fibrillation with RVR. He was given IV diltiazem without improvement.  Heart rate was 130 to 150s. His blood pressure was hypotensive. After discussing with cardiology team, eventually he underwent DC cardioversion in the ED with restoration of normal sinus rhythm. Afterward, he continued to be symptomatic. He was admitted to step down unit under internal medicine service. Cardiology has been consulted for paroxysmal atrial fibrillation with RVR. Of  note, patient is currently partial code after discussing with hospice staff, CPR and the mass only but no intubation/BiPAP or defibrillation. Chest x-ray showed cardiac enlargement was infiltration or edema in the lung basis.  Inpatient Medications  . albuterol  2.5 mg Nebulization Q8H  . allopurinol  100 mg Oral BID  . apixaban  5 mg Oral BID  . aspirin EC  81 mg Oral Daily  . atorvastatin  10 mg Oral QHS  . insulin aspart  0-5 Units Subcutaneous QHS  . insulin aspart  0-9 Units Subcutaneous TID WC  . mometasone-formoterol  2 puff Inhalation BID  . montelukast  10 mg Oral QHS  . pantoprazole  40 mg Oral Daily  . potassium chloride  40 mEq Oral Daily  . tiotropium  18 mcg Inhalation Daily  . torsemide  40 mg Oral BID    Family History Family History  Problem Relation Age of Onset  . Cancer Mother   . Diabetes Father   . Heart attack Brother   . Heart attack Sister      Social History Social History   Social History  . Marital Status: Widowed    Spouse Name: [name]  . Number of Children: [name]  . Years of Education: [name]   Occupational History  . retired    Social History Main Topics  . Smoking status: Never Smoker   . Smokeless tobacco: Never Used  . Alcohol Use: No  . Drug Use: No  . Sexual Activity: Not on file   Other Topics Concern  . Not on file   Social History Narrative     Review of Systems  General:  No chills, fever, night sweats or weight changes.  Cardiovascular:  No edema, palpitations. +chest pain, dyspnea on exertion Dermatological: No rash, lesions/masses Respiratory: +cough, dyspnea Urologic: No hematuria, dysuria Abdominal:   No nausea, vomiting, diarrhea, bright red blood per rectum, melena, or hematemesis Neurologic:  No visual changes, wkns, changes in mental status. All other systems reviewed and are otherwise negative except as noted above.  Physical Exam  Blood pressure 92/54, pulse 85, temperature 98.2 F (36.8 C), temperature  source Oral, resp. rate 32, height 6' (1.829 m), weight 246 lb 1.6 oz (111.63 kg), SpO2 97 %.  General: Pleasant, NAD Psych: Normal affect. Neuro: Alert and oriented X 3. Moves all extremities spontaneously. HEENT: Normal  Neck: Supple without bruits or JVD. Lungs:  Resp regular and unlabored. Bibasilar rale.  Heart: RRR no s3, s4, or murmurs. Abdomen: Soft, non-tender, non-distended, BS + x 4.  Extremities: No clubbing, cyanosis. DP/PT/Radials 2+ and equal bilaterally. Nonpitting edema only.  Labs  No results for input(s): CKTOTAL, CKMB, TROPONINI in the last 72 hours. Lab Results  Component Value Date   WBC 9.3 03/28/2016   HGB 10.4* 03/28/2016   HCT 31.3* 03/28/2016   MCV 88.4 03/28/2016   PLT 416* 03/28/2016    Recent Labs Lab 03/28/16 0303  NA 134*  K 5.2*  CL 95*  CO2 29  BUN 24*  CREATININE 1.51*  CALCIUM 8.3*  GLUCOSE 139*   No results found for: CHOL, HDL, LDLCALC, TRIG Lab Results  Component Value Date   DDIMER 0.52* 02/26/2016    Radiology/Studies  Ct Chest Wo Contrast  02/27/2016  CLINICAL DATA:  Dyspnea, shortness of breath, asthma EXAM: CT CHEST WITHOUT CONTRAST TECHNIQUE: Multidetector CT imaging of the chest was performed following the standard protocol without IV contrast. COMPARISON:  12/16/2015 FINDINGS: Mediastinum/Lymph Nodes: Secretions are noted in mid trachea please see axial image 32. No evidence of airway obstruction. A right lower paratracheal lymph node again noted Measures 1.4 cm short-axis without change from prior exam. Atherosclerotic calcifications of coronary arteries. Cardiomegaly again noted. Mild atherosclerotic calcifications of thoracic aorta. Ascending aorta measures 4.3 cm in diameter without change from prior exam. Lungs/Pleura: There is small to moderate right pleural effusion. Small left pleural effusion. Again noted chronic interstitial coarsening bilaterally. Peripheral reticulation and architectural distortion consistent with  fibrotic changes again noted. Findings consistent with pulmonary fibrosis. There is patchy superimposed peripheral ground-glass attenuation in right upper lobe. There is patchy airspace disease in left lower lobe. Findings highly suspicious for superimposed infiltrate/ pneumonia. Follow-up to resolution after treatment is recommended. Upper abdomen: The visualized upper abdomen shows multiple small calcified gallstones within gallbladder the largest measures 5 mm. No adrenal gland mass is noted. There is atrophic pancreas. Visualized unenhanced spleen is unremarkable. Visualized unenhanced liver shows no intrahepatic biliary ductal dilatation. Musculoskeletal: Sagittal images of the spine shows degenerative changes thoracic spine. Sagittal view of the sternum is unremarkable. No destructive rib lesions are noted. IMPRESSION: 1. Again noted chronic interstitial coarsening bilaterally. Peripheral reticulation and architectural distortion consistent with fibrotic changes again noted. Findings consistent with pulmonary fibrosis. There is patchy superimposed peripheral ground-glass attenuation in right upper lobe. There is patchy airspace disease in left lower lobe. Findings highly suspicious for superimposed infiltrate/ pneumonia. Follow-up to resolution after treatment is recommended. 2. Stable mediastinal adenopathy. 3. Small dependent secretions are noted in mid trachea without evidence of airway obstruction. 4. There is small to moderate right pleural effusion. Small left pleural effusion. 5. Degenerative changes thoracolumbar spine. 6. Calcified gallstones are noted within gallbladder. Electronically Signed   By: Lahoma Crocker M.D.   On: 02/27/2016 19:54   Dg Chest Port 1 View  03/28/2016  CLINICAL DATA:  Post cardioversion EXAM: PORTABLE CHEST 1 VIEW COMPARISON:  03/28/2016 FINDINGS: Cardiac enlargement. Infiltrates or atelectasis again noted in the lung bases without change. Underlying interstitial process. No  pneumothorax. Mediastinal contours appear intact. IMPRESSION: Cardiac enlargement with infiltration or edema in the lung bases. Similar appearance to previous study. Electronically Signed   By: Lucienne Capers M.D.   On: 03/28/2016 05:25   Dg Chest Portable 1 View  03/28/2016  CLINICAL DATA:  Palpitations and shortness of breath tonight. EXAM: PORTABLE CHEST 1 VIEW COMPARISON:  CT chest 02/27/2016.  Chest 02/26/2016. FINDINGS: Cardiac enlargement. Bilateral peripheral interstitial infiltrates with increased opacities in the lung bases, likely corresponding to small pleural effusions and basilar atelectasis. No pneumothorax. Mediastinal contours appear intact. IMPRESSION: Cardiac enlargement. Chronic interstitial process in the lungs with increasing opacities in the lung bases probably represent small effusions is seen on previous CT. Electronically Signed   By: Lucienne Capers M.D.   On: 03/28/2016 03:25    ECG  NSR with LBBB and PACs  ASSESSMENT AND PLAN  1. Acute respiratory failure in the setting of hypotension related to afib RVR  - did not appreciate significant fluid overload, possibly mildly fluid overloaded, but his symptom is out of proportion with degree of fluid overload.   - majority of SOB  is likely related to his frailty given poor EF, hypotension and afib with RVR  - will discuss with MD, continue torsemide 40mg  BID dosing, if has SOB may given addition dose of torsemide, but would not use IV at this time given persistent hypotension. His weight is actually lower than his discharge weight. Renal function stable. He does have nonpitting edema in bilateral LE, but no pitting edema. Bibasilar rale maybe atelectasis and pulm fibrosis. No significant JVD, however CXR shows possibly mild pulm edema.  2. Chronic systolic HF/NICM with baseline EF 25-30%  - last cath 09/2015 normal coronaries, with patent stent in D1  3. PAF with RVR on eliquis: underwent urgent DCCV in the ED  -  CHA2DS2-Vasc score 6 (age, HTN, DM, CAD, HF)  - DCCV in the ED, currently in NSR with episodic SVT burst, ideally need to be back on BB for rate control, but unable to do so given hypotension. Continue observation for now. Add back coreg once BP stable. Unfortunately expect similar symptom to recur given his poor prognosis, continue followup with hospice  4. Chest pressure: no further workup, occurred in the setting of acute respiratory distress. Note last cath 09/2015 normal coronaries, patent stent in D1.   5. CKD stage III: stable.   6. post inflammatory pulmonary fibrosis following PNA with ARDS in 08/2015  - no amiodarone given h/o pulm fibrosis  7. HTN, hypotensive now  8. HLD: continue low dose lipitor.  9. DM   Signed, Almyra Deforest, PA-C 03/28/2016, 11:54 AM

## 2016-03-28 NOTE — ED Notes (Signed)
Attempted report.  Pt has not been assigned to an Therapist, sports.

## 2016-03-28 NOTE — ED Notes (Signed)
Dr. Danford at bedside  

## 2016-03-28 NOTE — ED Notes (Signed)
Pt arrives via EMS from [facility]. Facility called out d/t pt c/o chest tightness and shortness of breath. Pt is a hospice patient at the facility. Upon EMS arrival they found pt to be afib RVR rate 130-150. Pt received 324 ASA and 10mg  Cardizem which brought heart rate down to 130.

## 2016-03-28 NOTE — ED Notes (Signed)
Dr.Pfeiffer aware of low Bps

## 2016-03-28 NOTE — ED Notes (Signed)
MD at bedside. 

## 2016-03-28 NOTE — Progress Notes (Signed)
Called to room by patient's daughter Precious Bard. Patti informed me, as well as Mr. Danger, that they had a long discussion and Mr. Schulteis now wished to be a partial code - CPR and meds only, no intubation/bipap/defib. Spoke with Dr. Doyle Askew regarding patient's wishes and order changed per patient request. Pt and daughter updated that change was made. I have reached out to [facility] who rounded on patient this am and updated her on the above changes. Collie Siad stated she would reach out to the hospice social worker who is following Mr. Carcione and see if she can be of any assistance. I also informed Collie Siad that Mr. Kuczek and his daughter had several questions regarding how to prevent patient from having to be re-admitted to the hospital in future instances. Collie Siad stated she will touch base with Precious Bard and see if she can answer her questions.

## 2016-03-28 NOTE — ED Notes (Signed)
RN has attempted to call daughter and grand daughter several times with no answer

## 2016-03-28 NOTE — Progress Notes (Signed)
Pt admitted after midnight, please see earlier admission note by Dr. Loleta Books. Cardio c/s.  Faye Ramsay, MD  [facility] Pager [phone]  If 7PM-7AM, please contact night-coverage [email or web address] Password [email or web address]

## 2016-03-28 NOTE — Progress Notes (Signed)
[facility] RN Liaison Note   This is a  GIP related and covered hospital admission of 7/10 per Dr. Shelbie Proctor with a hospice dx of Heart disease.    Patient was admitted to hospital during night due to A-fib with RVR  following an episode of severe chest tightness and shortness of breath at facility.   Facility staff called 911 .  Code status is currently Full Code.   The patient's daughter was not able to be reached.  His Granddaughter Janett Billow did visit while I was there and she said she has also been trying to reach her mother Mardene Celeste and she plans to go to her work to notify her of the admission.   Visited patient at bedside.  He is alert and oriented and was able to describe the events during the night.  The patient was cardioverted in the ED.   He reports now that the chest tightness is better but he does still feel short of breath.   He is currently on 02 at 2L for comfort.   Patient reports that his appetite has been declining at the facility recently and that he has not fully recovered from his most recent hospital admission in June.  He reports that  It takes too much effort to try to eat.  He reports that at baseline he is able to get out of bed independently.   He has two saline locks in place but is not on IVF.  He is receiving albuterol neb tx for dsypnea and continues on Eliquis.  Spoke with staff RN who did report patient's 02 sats were good however 02 applied for comfort as patient was experiencing some continued dyspnea.   [facility] will continue to follow patient and anticipate discharge needs.   TC was placed to daughter's cell phone, however was only able to leave message.  Will continue to try to reach daughter .   Updated med list and transfer summary placed on shadow chart.    Mickie Kay, [facility] Liaison  [phone]

## 2016-03-28 NOTE — H&P (Signed)
History and Physical  Patient Name: Christopher Holder     [id number]    DOB: October 24, 1929    DOA: 03/28/2016 PCP: Mackie Pai, PA-C   Patient coming from: [facility]  Chief Complaint: Dyspnea  HPI: Christopher Holder is a 82 y.o. male with a past medical history significant for CAD s/p PCI, chronic systolic HF, EF [id number], HTN, DM, CKD, OSA with CPAP, Paroxysmal Afib, and COPD who presents with acute onset dyspnea.  The was admitted from 6/9 to 6/16 for acute on chronic CHF, COPD, and CAP, improved with diuresis, doxycycline, bronchodilators and steroids.  Was seen by Cardiology and Palliative Care during that hospitalization, and ultimately was placed at [facility] for long-term care (he had previously rehabbed at [facility], but otherwise lived with his granddaughter).  Discharged to Hospice for CHF and COPD and pulmonary fibrosis after ARDS last Dec.  Since discharge, he is dyspneic with walking across the room, uses a cane or walker.  He was stable however, until tonight, he woke up in the middle of the night feeling short of breath and "chest heaviness".  There was no radiation of the chest discomfort.  There was no preceding leg swelling.  There was no cough or fever.    ED course: -Low grade temp 30F, HR 130s in Afib, and respirations fast, hypotensive in 80s -Na 134, K 5.2, Cr 1.5 (baseline 1.4-1.5), WBC 9.3K, Hgb 10.4 -The case was discussed with Cardiology who recommended cardioversion, which was performed and the patient was converted to sinus rhythm and BP came up to [id number] systolic -Metoprolol was given and his pressure dropped again -TRH were asked to evaluate for admission     ROS: All other systems negative except as just noted or noted in the history of present illness.    Past Medical History  Diagnosis Date  . Diabetes mellitus without complication ([place])   . CHF (congestive heart failure) ([place])   . Coronary artery disease   . COPD (chronic obstructive  pulmonary disease) ([place])   . Asthma   . Hypertension   . HOH (hard of hearing)   . Sleep apnea   . Myocardial infarction ([place])   . Hyperlipidemia 05/27/2015  . GERD (gastroesophageal reflux disease) 05/27/2015  . AKI (acute kidney injury) ([place]) 08/18/2015  . Arthritis   . Pulmonary fibrosis [facility])     Past Surgical History  Procedure Laterality Date  . Coronary angioplasty with stent placement    . Knee surgery    . Total knee arthroplasty      Knee replacement x2  . Cardioversion [name] 08/28/2015    Procedure: CARDIOVERSION;  Surgeon: Jerline Pain, MD;  Location: [place];  Service: Cardiovascular;  Laterality: [name];  . Cardiac catheterization [name] 10/02/2015    Procedure: Left Heart Cath and Coronary Angiography;  Surgeon: Leonie Man, MD;  Location: [place] [facility];  Service: Cardiovascular;  Laterality: [name];  . Joint replacement      Social History: Patient lives at [facility].  The patient walks with a walker.  Never smoker.  From [place].  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  Lived with his granddaughter who is in [place] here at [facility] until moving in permanently to [facility] with Hospice.    Allergies  Allergen Reactions  . Percocet [Oxycodone-Acetaminophen] Diarrhea, Nausea And Vomiting and Nausea Only  . Sulfa Antibiotics Diarrhea and Nausea And Vomiting  Family history: family history includes Cancer in his mother; Diabetes in his father; Heart attack in his brother and sister.  Prior to Admission medications   Medication Sig Start Date End Date Taking? Authorizing Provider  albuterol (PROVENTIL HFA;VENTOLIN HFA) 108 (90 BASE) MCG/ACT inhaler Inhale 2 puffs into the lungs every 8 (eight) hours.    Yes Historical Provider, MD  allopurinol (ZYLOPRIM) 100 MG tablet Take 1 tablet by mouth two  times daily 02/29/16  Yes Mackie Pai, PA-C  aspirin EC 81 MG EC tablet Take 1 tablet (81 mg total) by mouth daily.  03/04/16  Yes Clanford Marisa Hua, MD  atorvastatin (LIPITOR) 10 MG tablet Take 10 mg by mouth at bedtime.    Yes Historical Provider, MD  carvedilol (COREG) 3.125 MG tablet 1 tab po bid 01/19/16  Yes Edward [name], PA-C  DULERA 200-5 MCG/ACT AERO Use 2 puffs two times daily 11/02/15  Yes Edward [name], PA-C  ELIQUIS 5 MG TABS tablet Take 1 tablet (5 mg total) by mouth 2 (two) times daily. 10/12/15  Yes Edward [name], PA-C  fluticasone (FLONASE) 50 MCG/ACT nasal spray Use 2 sprays in each  nostril daily 11/13/15  Yes Midge Minium, MD  glipiZIDE (GLUCOTROL) 5 MG tablet Take 1 tablet (5 mg total) by mouth daily before breakfast. Reported on 12/29/2015 03/04/16  Yes Clanford Marisa Hua, MD  guaifenesin (ROBITUSSIN) 100 MG/5ML syrup Take 200 mg by mouth every 4 (four) hours as needed for cough.   Yes Historical Provider, MD  losartan (COZAAR) 25 MG tablet Take 25 mg by mouth daily.   Yes Historical Provider, MD  montelukast (SINGULAIR) 10 MG tablet Take 1 tablet by mouth at  bedtime 02/29/16  Yes Mackie Pai, PA-C  omeprazole (PRILOSEC) 20 MG capsule Take 1 capsule by mouth  daily 02/29/16  Yes Edward [name], PA-C  polyethylene glycol (MIRALAX / GLYCOLAX) packet Take 17 g by mouth daily as needed for mild constipation.   Yes Historical Provider, MD  potassium chloride (K-DUR,KLOR-CON) 10 MEQ tablet Take 40 mEq by mouth daily.  02/22/16  Yes Historical Provider, MD  senna-docusate (SENOKOT-S) 8.6-50 MG tablet Take 1 tablet by mouth 2 (two) times daily.   Yes Historical Provider, MD  tiotropium (SPIRIVA) 18 MCG inhalation capsule Place 1 capsule (18 mcg total) into inhaler and inhale daily. 09/22/15  Yes Edward [name], PA-C  torsemide (DEMADEX) 20 MG tablet Take 2 tablets (40 mg total) by mouth 2 (two) times daily. 03/04/16  Yes Clanford Marisa Hua, MD  traZODone (DESYREL) 50 MG tablet 1/2 tab po q hs 01/19/16  Yes Mackie Pai, PA-C       Physical Exam: BP 93/50 mmHg  Pulse 71  Temp(Src) 99 F (37.2  C) (Oral)  Resp 19  SpO2 97% General appearance: Elderly male, alert and in moderate distress from dyspnea.   Eyes: Conjunctiva normal, lids and lashes normal.   PERRL.  ENT: No nasal deformity, discharge.  OP moist without lesions.  Poor dentition. Lymph: No cervical or supraclavicular lymphadenopathy. Skin: Warm and dry.  No suspicious rashes or lesions. Cardiac: Normal rate, irregular, nl S1-S2, no murmurs appreciated.  Capillary refill is brisk.  JVP not visible.  No LE edema.  Radial and DP pulses 2+ and symmetric. Respiratory: Normal respiratory rate and rhythm.  Diminished at bases, no wheezes, rales in bases. GI: Abdomen soft without rigidity.  No TTP. No ascites, distension, hepatosplenomegaly.   MSK: No deformities or effusions.  No clubbing/cyanosis. Neuro: Cranial nerves normal. Sensorium  intact and responding to questions, attention normal.  Speech is fluent.  Moves all extremities equally and with normal coordination, but weak Psych: Affect blunted.  Judgment and insight appear normal.       Labs on Admission:  I have personally reviewed following labs and imaging studies: CBC:  Recent Labs Lab 03/28/16 0303  WBC 9.3  HGB 10.4*  HCT 31.3*  MCV 88.4  PLT 123456*   Basic Metabolic Panel:  Recent Labs Lab 03/28/16 0303  NA 134*  K 5.2*  CL 95*  CO2 29  GLUCOSE 139*  BUN 24*  CREATININE 1.51*  CALCIUM 8.3*   GFR: CrCl cannot be calculated (Unknown ideal weight.).  BNP (last 3 results)  Recent Labs  10/06/15 1153 10/21/15 1106 01/05/16 1148  PROBNP 474.0* 318.0* 538.0*     Radiological Exams on Admission: Personally reviewed: Dg Chest Port 1 View  03/28/2016  CLINICAL DATA:  Post cardioversion EXAM: PORTABLE CHEST 1 VIEW COMPARISON:  03/28/2016 FINDINGS: Cardiac enlargement. Infiltrates or atelectasis again noted in the lung bases without change. Underlying interstitial process. No pneumothorax. Mediastinal contours appear intact. IMPRESSION: Cardiac  enlargement with infiltration or edema in the lung bases. Similar appearance to previous study. Electronically Signed   By: Lucienne Capers M.D.   On: 03/28/2016 05:25   Dg Chest Portable 1 View  03/28/2016  CLINICAL DATA:  Palpitations and shortness of breath tonight. EXAM: PORTABLE CHEST 1 VIEW COMPARISON:  CT chest 02/27/2016.  Chest 02/26/2016. FINDINGS: Cardiac enlargement. Bilateral peripheral interstitial infiltrates with increased opacities in the lung bases, likely corresponding to small pleural effusions and basilar atelectasis. No pneumothorax. Mediastinal contours appear intact. IMPRESSION: Cardiac enlargement. Chronic interstitial process in the lungs with increasing opacities in the lung bases probably represent small effusions is seen on previous CT. Electronically Signed   By: Lucienne Capers M.D.   On: 03/28/2016 03:25    EKG: Independently reviewed. Initial ECG with Afib rate 130s. Repeat after cardioversion in sinus with frequent PACs, LBBB.    Assessment/Plan 1. Afib with RVR and hypotension:  CHADS2Vasc 6 on Eliquis and carvedilol.   -Continue Eliquis -Hold Carvedilol until BP improves -Amiodarone if HR increases in the interim -Consult to Cardiology   2. Possible acute on chronic systolic CHF:  Precipitated by #1.  Last discharge dry weight 115 kg, and actually below that today although edema on CXR vs fibrosis.   -Continue torsemide -Continue statin aspirin -Hold losartan and BB given BP -Consult to Cardiology -Daily BMP -Strict I/Os  3. COPD with comorbid post-inflammatory fibrosis:  -Continue Dulera, Spiriva -Continue albuterol, scheduled -Continue montelukast, flonase  4. Gout:  -Continue allopurinol  5. Chronic normocytic anemia:  Stable  6. OSA on CPAP:  -Continue CPAP  7.Goals of Care: Patient wants to be on life support, "only for a month" and then can be taken off, because "if it is my time it is my time."   Seen by Palliative Care during  last hospitalization and on Hospice now. Also endorses some frustration with quality of life since major illness started last Nov/Dec, "I can't even eat anymore because it's such a struggle to eat" -Revisit goals of care with family when available       DVT prophylaxis: Not needed, on Eliquis  Code Status: FULL  Family Communication: None present or available by phone  Disposition Plan: Anticipate stepdown admission, continue telemetry monitoring and Cardiology consultation.   Consults called: Cardiology Admission status: INPATIENT, stepdown      Medical decision  making: Patient seen at 6:17 AM on 03/28/2016.  The patient was discussed with Dr. Johnney Killian. What exists of the patient's chart was reviewed in depth.  Clinical condition: requiring close hemodynamic monitoring.        Edwin Dada [facility] Pager [phone]

## 2016-03-28 NOTE — ED Provider Notes (Signed)
CSN: [id number]     Arrival date & time 03/28/16  0250 History   First MD Initiated Contact with Patient 03/28/16 0321     Chief Complaint  Patient presents with  . Chest Pain     (Consider location/radiation/quality/duration/timing/severity/associated sxs/prior Treatment) HPI Patient reports he awakened with chest tightness and shortness of breath. He states he this is very similar to what happened prior to his last several hospitalizations. He reports he just wakes up and feels like his chest has a heavy pressure and he can't breathe very well. Patient reports he is also getting some swelling in his legs. He reports he is taking Lasix regularly. Upon EMS arrival patient was in atrial fibrillation with rapid ventricular response. Rate was 130 to 150s. Patient was given aspirin and Cardizem 10 mg IV prior to arrival. Past Medical History  Diagnosis Date  . Diabetes mellitus without complication ([place])   . CHF (congestive heart failure) ([place])   . Coronary artery disease   . COPD (chronic obstructive pulmonary disease) ([place])   . Asthma   . Hypertension   . HOH (hard of hearing)   . Sleep apnea   . Myocardial infarction ([place])   . Hyperlipidemia 05/27/2015  . GERD (gastroesophageal reflux disease) 05/27/2015  . AKI (acute kidney injury) ([place]) 08/18/2015  . Arthritis   . Pulmonary fibrosis [facility])    Past Surgical History  Procedure Laterality Date  . Coronary angioplasty with stent placement    . Knee surgery    . Total knee arthroplasty      Knee replacement x2  . Cardioversion [name] 08/28/2015    Procedure: CARDIOVERSION;  Surgeon: Jerline Pain, MD;  Location: [place];  Service: Cardiovascular;  Laterality: [name];  . Cardiac catheterization [name] 10/02/2015    Procedure: Left Heart Cath and Coronary Angiography;  Surgeon: Leonie Man, MD;  Location: [place] [facility];  Service: Cardiovascular;  Laterality: [name];  . Joint replacement     Family History  Problem Relation Age of Onset   . Cancer Mother   . Diabetes Father   . Heart attack Brother   . Heart attack Sister    Social History  Substance Use Topics  . Smoking status: Never Smoker   . Smokeless tobacco: Never Used  . Alcohol Use: No    Review of Systems  10 Systems reviewed and are negative for acute change except as noted in the HPI.   Allergies  Percocet and Sulfa antibiotics  Home Medications   Prior to Admission medications   Medication Sig Start Date End Date Taking? Authorizing Provider  albuterol (PROVENTIL HFA;VENTOLIN HFA) 108 (90 BASE) MCG/ACT inhaler Inhale 2 puffs into the lungs every 8 (eight) hours.    Yes Historical Provider, MD  allopurinol (ZYLOPRIM) 100 MG tablet Take 1 tablet by mouth two  times daily 02/29/16  Yes Mackie Pai, PA-C  aspirin EC 81 MG EC tablet Take 1 tablet (81 mg total) by mouth daily. 03/04/16  Yes Clanford Marisa Hua, MD  atorvastatin (LIPITOR) 10 MG tablet Take 10 mg by mouth at bedtime.    Yes Historical Provider, MD  carvedilol (COREG) 3.125 MG tablet 1 tab po bid 01/19/16  Yes Edward [name], PA-C  DULERA 200-5 MCG/ACT AERO Use 2 puffs two times daily 11/02/15  Yes Edward [name], PA-C  ELIQUIS 5 MG TABS tablet Take 1 tablet (5 mg total) by mouth 2 (two) times daily. 10/12/15  Yes Edward [name], PA-C  fluticasone (FLONASE) 50 MCG/ACT nasal  spray Use 2 sprays in each  nostril daily 11/13/15  Yes Midge Minium, MD  glipiZIDE (GLUCOTROL) 5 MG tablet Take 1 tablet (5 mg total) by mouth daily before breakfast. Reported on 12/29/2015 03/04/16  Yes Clanford Marisa Hua, MD  guaifenesin (ROBITUSSIN) 100 MG/5ML syrup Take 200 mg by mouth every 4 (four) hours as needed for cough.   Yes Historical Provider, MD  losartan (COZAAR) 25 MG tablet Take 25 mg by mouth daily.   Yes Historical Provider, MD  montelukast (SINGULAIR) 10 MG tablet Take 1 tablet by mouth at  bedtime 02/29/16  Yes Mackie Pai, PA-C  omeprazole (PRILOSEC) 20 MG capsule Take 1 capsule by mouth  daily  02/29/16  Yes Edward [name], PA-C  polyethylene glycol (MIRALAX / GLYCOLAX) packet Take 17 g by mouth daily as needed for mild constipation.   Yes Historical Provider, MD  potassium chloride (K-DUR,KLOR-CON) 10 MEQ tablet Take 40 mEq by mouth daily.  02/22/16  Yes Historical Provider, MD  senna-docusate (SENOKOT-S) 8.6-50 MG tablet Take 1 tablet by mouth 2 (two) times daily.   Yes Historical Provider, MD  tiotropium (SPIRIVA) 18 MCG inhalation capsule Place 1 capsule (18 mcg total) into inhaler and inhale daily. 09/22/15  Yes Edward [name], PA-C  torsemide (DEMADEX) 20 MG tablet Take 2 tablets (40 mg total) by mouth 2 (two) times daily. 03/04/16  Yes Clanford Marisa Hua, MD  traZODone (DESYREL) 50 MG tablet 1/2 tab po q hs 01/19/16  Yes Edward [name], PA-C   BP 90/62 mmHg  Pulse 69  Temp(Src) 99 F (37.2 C) (Oral)  Resp 32  SpO2 94% Physical Exam  Constitutional:  Patient is moderately deconditioned. He is alert and nontoxic. Mild increased work of breathing.  HENT:  Head: Normocephalic and atraumatic.  Mouth/Throat: Oropharynx is clear and moist.  Eyes: EOM are normal.  Neck: Neck supple.  Cardiovascular:  Tachycardia, irregularly irregular.  Pulmonary/Chest:  Rales bilateral bases. Occasional dry cough. Occasional wheeze mid lung field.    ED Course  .Cardioversion Date/Time: 03/28/2016 4:20 AM Performed by: Charlesetta Shanks Authorized by: Charlesetta Shanks Consent: Written consent obtained. Risks and benefits: risks, benefits and alternatives were discussed Consent given by: patient Patient sedated: yes Sedation type: moderate (conscious) sedation Sedatives: etomidate Sedation start date/time: 03/28/2016 4:30 AM Sedation end date/time: 03/28/2016 4:50 AM Cardioversion basis: emergent Pre-procedure rhythm: atrial fibrillation Electrodes: pads Electrodes placed: anterior-posterior Number of attempts: 2 Attempt 1 mode: synchronous Attempt 1 waveform: biphasic Attempt 1 shock (in  Joules): 100 Attempt 1 outcome: no change in rhythm Attempt 2 mode: synchronous Attempt 2 waveform: biphasic Attempt 2 shock (in Joules): 150 Attempt 2 outcome: conversion to normal sinus rhythm Post-procedure rhythm: normal sinus rhythm Complications: no complications  .Sedation Date/Time: 03/28/2016 4:30 AM Performed by: Charlesetta Shanks Authorized by: Charlesetta Shanks  Consent:    Consent obtained:  Written   Consent given by:  Patient   Risks discussed:  Inadequate sedation, respiratory compromise necessitating ventilatory assistance and intubation, prolonged sedation necessitating reversal and prolonged hypoxia resulting in organ damage Indications:    Sedation purpose:  Cardioversion   Procedure necessitating sedation performed by:  Physician performing sedation Pre-sedation assessment:    NPO status caution: urgency dictates proceeding with non-ideal NPO status     ASA classification: class 4 - patient with severe systemic disease that is a constant threat to life     Mouth opening:  3 or more finger widths   Thyromental distance:  3 finger widths   Mallampati score:  I - soft palate, uvula, fauces, pillars visible   Pre-sedation assessments completed and reviewed: airway patency, cardiovascular function, hydration status, mental status, nausea/vomiting, pain level, respiratory function and temperature     Pre-sedation assessment completed:  03/28/2016 4:00 AM Immediate pre-procedure details:    Reviewed: vital signs     Verified: bag valve mask available, emergency equipment available, intubation equipment available, IV patency confirmed, oxygen available, reversal medications available and suction available   Procedure details (see MAR for exact dosages):    Sedation start time:  03/28/2016 4:30 AM   Preoxygenation:  Nasal cannula and nonrebreather mask   Sedation:  Etomidate   Intra-procedure monitoring:  Blood pressure monitoring, cardiac monitor, continuous pulse oximetry,  frequent vital sign checks and frequent LOC assessments   Intra-procedure events: none     Intra-procedure management:  Airway repositioning   Sedation end time:  03/28/2016 4:50 AM Post-procedure details:    Recovery: Patient returned to pre-procedure baseline     Post-sedation assessments completed and reviewed: airway patency, cardiovascular function, hydration status, mental status, nausea/vomiting, pain level and respiratory function     Patient is stable for discharge or admission: Yes     Patient tolerance:  Tolerated well, no immediate complications  (including critical care time) CRITICAL CARE Performed by: Charlesetta Shanks   Total critical care time: 60 minutes. Recurrent rechecks of patient condition and observation of vital signs.  Critical care time was exclusive of separately billable procedures and treating other patients.  Critical care was necessary to treat or prevent imminent or life-threatening deterioration.  Critical care was time spent personally by me on the following activities: development of treatment plan with patient and/or surrogate as well as nursing, discussions with consultants, evaluation of patient's response to treatment, examination of patient, obtaining history from patient or surrogate, ordering and performing treatments and interventions, ordering and review of laboratory studies, ordering and review of radiographic studies, pulse oximetry and re-evaluation of patient's condition. Labs Review Labs Reviewed  BASIC METABOLIC PANEL - Abnormal; Notable for the following:    Sodium 134 (*)    Potassium 5.2 (*)    Chloride 95 (*)    Glucose, Bld 139 (*)    BUN 24 (*)    Creatinine, Ser 1.51 (*)    Calcium 8.3 (*)    GFR calc non Af Amer 41 (*)    GFR calc Af Amer 47 (*)    All other components within normal limits  CBC - Abnormal; Notable for the following:    RBC 3.54 (*)    Hemoglobin 10.4 (*)    HCT 31.3 (*)    RDW 17.7 (*)    Platelets 416  (*)    All other components within normal limits  MAGNESIUM  I-STAT TROPOININ, ED    Imaging Review Dg Chest Port 1 View  03/28/2016  CLINICAL DATA:  Post cardioversion EXAM: PORTABLE CHEST 1 VIEW COMPARISON:  03/28/2016 FINDINGS: Cardiac enlargement. Infiltrates or atelectasis again noted in the lung bases without change. Underlying interstitial process. No pneumothorax. Mediastinal contours appear intact. IMPRESSION: Cardiac enlargement with infiltration or edema in the lung bases. Similar appearance to previous study. Electronically Signed   By: Lucienne Capers M.D.   On: 03/28/2016 05:25   Dg Chest Portable 1 View  03/28/2016  CLINICAL DATA:  Palpitations and shortness of breath tonight. EXAM: PORTABLE CHEST 1 VIEW COMPARISON:  CT chest 02/27/2016.  Chest 02/26/2016. FINDINGS: Cardiac enlargement. Bilateral peripheral interstitial infiltrates with increased opacities in the lung  bases, likely corresponding to small pleural effusions and basilar atelectasis. No pneumothorax. Mediastinal contours appear intact. IMPRESSION: Cardiac enlargement. Chronic interstitial process in the lungs with increasing opacities in the lung bases probably represent small effusions is seen on previous CT. Electronically Signed   By: Lucienne Capers M.D.   On: 03/28/2016 03:25   I have personally reviewed and evaluated these images and lab results as part of my medical decision-making. First EKG interpreted by EDP: Atrial fibrillation rate of 130, QRS 131 QTC 508. Left bundle branch pattern without acute STEMI pattern.  Post cardioversion EKG interpreted by EDP: Sinus rhythm 79, frequent PAC, left bundle branch block without acute ST-T wave elevation.    Consult: Reviewed with Dr. Susy Manor of cardiology. He advised cardioversion over other medication management. This was after review of the patient's chart and medical history. Patient is on Eliquis chronically. Consult Dr. Oletta Darter reviewed patient's case and history  of present illness. Advises at this time admitted to step down. Consider amiodarone if patient needs additional rate control. MDM   Final diagnoses:  Atrial fibrillation with rapid ventricular response ([place])  Cardiomyopathy ([place])   Patient's last EF was approximately 20-25%. The patient presented with rapid atrial fibrillation. Blood pressure was low. He had had Cardizem administered by EMS without improvement and rate control. After consultation with cardiology was determined best management was for cardioversion. Patient is on Eliquis.Marland Kitchen He was successfully cardioverted but he did remain short of breath and subjectively feeling unwell. Patient is admitted to step down unit for ongoing monitoring and medical management.    Charlesetta Shanks, MD 03/28/16 [phone]

## 2016-03-28 NOTE — Consult Note (Signed)
   [facility] CM Inpatient Consult   03/28/2016  Christopher Holder Sep 08, 1930 [id number]  Patient screened for potential [facility] services. Patient is eligible for [facility] Care Management services under patient's [facility]  plan. Patient was recently discharged from [place] services as he was active with Hospice and [place].  Will follow up with inpatient to check for updates.  This patient is a re-admission 4 hospitalizations  in the past 6 months.  Please place a [facility] Care Management consult or for questions contact:   Natividad Brood, RN BSN [place] [facility] Liaison  [phone] business mobile phone Toll free office [phone]

## 2016-03-29 DIAGNOSIS — I4891 Unspecified atrial fibrillation: Secondary | ICD-10-CM | POA: Diagnosis present

## 2016-03-29 LAB — CBC
HCT: 31.8 % — ABNORMAL LOW (ref 39.0–52.0)
HEMOGLOBIN: 10.1 g/dL — AB (ref 13.0–17.0)
MCH: 28.5 pg (ref 26.0–34.0)
MCHC: 31.8 g/dL (ref 30.0–36.0)
MCV: 89.8 fL (ref 78.0–100.0)
Platelets: 319 10*3/uL (ref 150–400)
RBC: 3.54 MIL/uL — AB (ref 4.22–5.81)
RDW: 17.4 % — ABNORMAL HIGH (ref 11.5–15.5)
WBC: 9.3 10*3/uL (ref 4.0–10.5)

## 2016-03-29 LAB — GLUCOSE, CAPILLARY
GLUCOSE-CAPILLARY: 160 mg/dL — AB (ref 65–99)
GLUCOSE-CAPILLARY: 118 mg/dL — AB (ref 65–99)
GLUCOSE-CAPILLARY: 221 mg/dL — AB (ref 65–99)
Glucose-Capillary: 147 mg/dL — ABNORMAL HIGH (ref 65–99)

## 2016-03-29 LAB — BASIC METABOLIC PANEL
ANION GAP: 11 (ref 5–15)
BUN: 28 mg/dL — ABNORMAL HIGH (ref 6–20)
CHLORIDE: 96 mmol/L — AB (ref 101–111)
CO2: 29 mmol/L (ref 22–32)
Calcium: 8.3 mg/dL — ABNORMAL LOW (ref 8.9–10.3)
Creatinine, Ser: 1.92 mg/dL — ABNORMAL HIGH (ref 0.61–1.24)
GFR calc non Af Amer: 31 mL/min — ABNORMAL LOW (ref >60)
GFR, EST AFRICAN AMERICAN: 36 mL/min — AB (ref >60)
Glucose, Bld: 147 mg/dL — ABNORMAL HIGH (ref 65–99)
Potassium: 4.5 mmol/L (ref 3.5–5.1)
Sodium: 136 mmol/L (ref 135–145)

## 2016-03-29 LAB — MAGNESIUM: MAGNESIUM: 1.7 mg/dL (ref 1.7–2.4)

## 2016-03-29 MED ORDER — ALBUTEROL SULFATE (2.5 MG/3ML) 0.083% IN NEBU
2.5000 mg | INHALATION_SOLUTION | Freq: Three times a day (TID) | RESPIRATORY_TRACT | Status: DC
  Administered 2016-03-29 – 2016-03-31 (×6): 2.5 mg via RESPIRATORY_TRACT
  Filled 2016-03-29 (×8): qty 3

## 2016-03-29 MED ORDER — MAGNESIUM SULFATE 2 GM/50ML IV SOLN
2.0000 g | Freq: Once | INTRAVENOUS | Status: AC
  Administered 2016-03-29: 2 g via INTRAVENOUS
  Filled 2016-03-29: qty 50

## 2016-03-29 MED ORDER — METOPROLOL TARTRATE 5 MG/5ML IV SOLN
5.0000 mg | Freq: Four times a day (QID) | INTRAVENOUS | Status: DC | PRN
  Administered 2016-03-29: 5 mg via INTRAVENOUS
  Filled 2016-03-29: qty 5

## 2016-03-29 MED ORDER — METOPROLOL TARTRATE 12.5 MG HALF TABLET
12.5000 mg | ORAL_TABLET | Freq: Two times a day (BID) | ORAL | Status: DC
  Administered 2016-03-29 – 2016-03-31 (×5): 12.5 mg via ORAL
  Filled 2016-03-29 (×5): qty 1

## 2016-03-29 NOTE — Care Management Obs Status (Signed)
[place] NOTIFICATION   Patient Details  Name: Maximos Kralicek MRN: [id number] Date of Birth: 07-19-1930   Medicare Observation Status Notification Given:  Yes  MOON/CC44 given to and signed by pt; copy to [place] for scanning into medical records  Erenest Rasher, RN 03/29/2016, 5:53 PM

## 2016-03-29 NOTE — Progress Notes (Signed)
Patient ID: Kassim Frisque, male   DOB: 10/20/1929, 82 y.o.   MRN: [id number]    PROGRESS NOTE    Glennis Rauseo  [id number] DOB: 11-07-1929 DOA: 03/28/2016  PCP: Mackie Pai, PA-C   Brief Narrative:  82 y.o. male with a past medical history significant for CAD s/p PCI, chronic systolic HF, EF 123XX123, HTN, DM, CKD, OSA with CPAP, Paroxysmal Afib, and COPD who presented with acute onset dyspnea.  Pt was admitted from 6/9 to 6/16 for acute on chronic CHF, COPD, and CAP, improved with diuresis, doxycycline, bronchodilators and steroids. Was seen by Cardiology and Palliative Care during that hospitalization, and ultimately was placed at [facility] for long-term care (he had previously rehabbed at [facility], but otherwise lived with his granddaughter). Discharged to Hospice for CHF and COPD and pulmonary fibrosis after ARDS last Dec.  ED course: - Low grade temp 70F, HR 130s in Afib, and respirations fast, hypotensive in 80s - Na 134, K 5.2, Cr 1.5 (baseline 1.4-1.5), WBC 9.3K, Hgb 10.4 - The case was discussed with Cardiology who recommended cardioversion, which was performed and the patient was converted to sinus rhythm and BP came up to A999333 systolic - TRH were asked to evaluate for admission  Assessment & Plan:   Principal Problem: Acute respiratory failure in the setting of hypotension related to afib RVR - no significant fluid overload, symptom is out of proportion with degree of fluid overload.  - continue torsemide 40mg  BID dosing, if has SOB may given addition dose of torsemide, would not use IV given persistent hypotension - weight is actually lower than his discharge weight.  - Renal function stable. He does have nonpitting edema in bilateral LE, but no pitting edema  Chronic systolic HF/NICM with baseline EF 25-30% - last cath 09/2015 normal coronaries, with patent stent in D1  PAF with RVR on eliquis: underwent urgent DCCV in the ED - CHA2DS2-Vasc score 6  (age, HTN, DM, CAD, HF) - DCCV in the ED, currently in NSR with episodic SVT burst, ideally need to be back on BB for rate control, - start low dose BB  Chest pressure - no further workup, occurred in the setting of acute respiratory distress.  - Note last cath 09/2015 normal coronaries, patent stent in D1.   CKD stage III - stable.   DM type II with complications of nephropathy - reasonable inpatient control   Obesity  Body mass index is 33.38 kg/(m^2).  DVT prophylaxis: Eliquis  Code Status: Partial no CPR or NIPV, [place] with meds per ACLS Family Communication: Patient at bedside  Disposition Plan: SNF in 1-2 days  Consultants:   Cardio  Procedures:   Cardioversion in ED  Antimicrobials:   None  Subjective: Still with dyspnea and chest pressure.   Objective: Filed Vitals:   03/29/16 0458 03/29/16 0844 03/29/16 1135 03/29/16 1554  BP: 93/51  103/70 103/68  Pulse: 88  105 121  Temp: 97.6 F (36.4 C)  98 F (36.7 C) 98.6 F (37 C)  TempSrc: Oral  Oral Oral  Resp: 25   27  Height:      Weight: 111.676 kg (246 lb 3.2 oz)     SpO2: 100% 97% 94% 96%    Intake/Output Summary (Last 24 hours) at 03/29/16 1841 Last data filed at 03/29/16 0824  Gross per 24 hour  Intake    360 ml  Output    650 ml  Net   -290 ml   [employer]  03/28/16 0855 03/29/16 0458  Weight: 111.63 kg (246 lb 1.6 oz) 111.676 kg (246 lb 3.2 oz)    Examination:  General exam: Appears calm and comfortable  Respiratory system: Respiratory effort normal. Cardiovascular system: S1 & S2 heard, tachy. No JVD, rubs, gallops or clicks.  Gastrointestinal system: Abdomen is nondistended, soft and nontender. No organomegaly or masses felt.  Central nervous system: Alert and oriented. No focal neurological deficits. Extremities: Symmetric 5 x 5 power.    Data Reviewed: I have personally reviewed following labs and imaging studies  CBC:  Recent Labs Lab 03/28/16 0303 03/29/16 0204  WBC  9.3 9.3  HGB 10.4* 10.1*  HCT 31.3* 31.8*  MCV 88.4 89.8  PLT 416* 99991111   Basic Metabolic Panel:  Recent Labs Lab 03/28/16 0303 03/29/16 0204  NA 134* 136  K 5.2* 4.5  CL 95* 96*  CO2 29 29  GLUCOSE 139* 147*  BUN 24* 28*  CREATININE 1.51* 1.92*  CALCIUM 8.3* 8.3*  MG 2.1 1.7    Recent Labs  10/06/15 1153 10/21/15 1106 01/05/16 1148  PROBNP 474.0* 318.0* 538.0*   HbA1C: No results for input(s): HGBA1C in the last 72 hours. CBG:  Recent Labs Lab 03/28/16 1609 03/28/16 2056 03/29/16 0746 03/29/16 1134 03/29/16 1613  GLUCAP 120* 171* 147* 160* 118*   Radiology Studies: Dg Chest Port 1 View  03/28/2016  CLINICAL DATA:  Post cardioversion EXAM: PORTABLE CHEST 1 VIEW COMPARISON:  03/28/2016 FINDINGS: Cardiac enlargement. Infiltrates or atelectasis again noted in the lung bases without change. Underlying interstitial process. No pneumothorax. Mediastinal contours appear intact. IMPRESSION: Cardiac enlargement with infiltration or edema in the lung bases. Similar appearance to previous study. Electronically Signed   By: Lucienne Capers M.D.   On: 03/28/2016 05:25   Dg Chest Portable 1 View  03/28/2016  CLINICAL DATA:  Palpitations and shortness of breath tonight. EXAM: PORTABLE CHEST 1 VIEW COMPARISON:  CT chest 02/27/2016.  Chest 02/26/2016. FINDINGS: Cardiac enlargement. Bilateral peripheral interstitial infiltrates with increased opacities in the lung bases, likely corresponding to small pleural effusions and basilar atelectasis. No pneumothorax. Mediastinal contours appear intact. IMPRESSION: Cardiac enlargement. Chronic interstitial process in the lungs with increasing opacities in the lung bases probably represent small effusions is seen on previous CT. Electronically Signed   By: Lucienne Capers M.D.   On: 03/28/2016 03:25      Scheduled Meds: . albuterol  2.5 mg Nebulization TID  . allopurinol  100 mg Oral BID  . apixaban  2.5 mg Oral BID  . aspirin EC  81 mg  Oral Daily  . atorvastatin  10 mg Oral QHS  . insulin aspart  0-5 Units Subcutaneous QHS  . insulin aspart  0-9 Units Subcutaneous TID WC  . metoprolol tartrate  12.5 mg Oral BID  . mometasone-formoterol  2 puff Inhalation BID  . montelukast  10 mg Oral QHS  . pantoprazole  40 mg Oral Daily  . potassium chloride  40 mEq Oral Daily  . tiotropium  18 mcg Inhalation Daily  . torsemide  40 mg Oral BID   Continuous Infusions:    LOS: 1 day    Time spent: 20 minutes   Faye Ramsay, MD [facility] Pager [phone]  If 7PM-7AM, please contact night-coverage [email or web address] Password [facility] 03/29/2016, 6:41 PM

## 2016-03-29 NOTE — Discharge Instructions (Signed)

## 2016-03-29 NOTE — Evaluation (Signed)
Physical Therapy Evaluation Patient Details Name: Christopher Holder MRN: [id number] DOB: Aug 02, 1930 Today's Date: 03/29/2016   History of Present Illness  82 y.o. male with a past medical history significant for CAD s/p PCI, chronic systolic HF, EF 123XX123, HTN, DM, CKD, OSA with CPAP, Paroxysmal Afib, and COPD who presents with acute onset dyspnea  Clinical Impression  Patient demonstrates deficits in functional mobility as indicated below. Will need continued skilled PT to address deficits and maximize function. Will see as indicated and progress as tolerated. OF NOTE:patient very slow with ambulation, limited activity tolerance, 3 standing rest breaks and one sitting rest break. patient cued for nasal inhalation with supplemental O2. Patient with rapid afib extremely tachycardic with HR reaching as high as 158. O2 saturations remianing upper 80s with activity but questionable validity or reading dur to afib.   Question desires for ST SNF for rehabilitation (believe patient would benefit) vs increased assist at ALF with hospice.     Follow Up Recommendations SNF;Supervision for mobility/OOB (vs increased assist at ALF)    Equipment Recommendations  None recommended by PT    Recommendations for Other Services       Precautions / Restrictions Precautions Precautions: Fall Precaution Comments: watch HR and O2 saturations, monitor DOE Restrictions Weight Bearing Restrictions: No      Mobility  Bed Mobility Overal bed mobility: Needs Assistance Bed Mobility: Sit to Supine           General bed mobility comments: increased time to perform, assist for elevation of LEs and positioning in bed  Transfers Overall transfer level: Needs assistance Equipment used: Rolling walker (2 wheeled) Transfers: Sit to/from Stand Sit to Stand: Min assist         General transfer comment: VCs for hand placement and safety, cued for upright posture  Ambulation/Gait Ambulation/Gait assistance: Min  assist;+2 safety/equipment Ambulation Distance (Feet): 34 Feet Assistive device: Rolling walker (2 wheeled) Gait Pattern/deviations: Step-to pattern;Decreased stride length;Shuffle;Trunk flexed Gait velocity: decreased Gait velocity interpretation: <1.8 ft/sec, indicative of risk for recurrent falls General Gait Details: patient very slow with ambulation, limited activity tolerance, 3 standing rest breaks and one sitting rest break. patient cued for nasal inhalation with supplemental O2. Patient with rapid afib extremely tachycardic with HR reaching as high as 158. O2 saturations remianing upper 80s with activity but questionable validity or reading dur to afib.   Stairs            Wheelchair Mobility    Modified Rankin (Stroke Patients Only)       Balance Overall balance assessment: Needs assistance Sitting-balance support: Feet supported Sitting balance-Leahy Scale: Fair   Postural control: Other (comment) (flexed posture/ fwd head)   Standing balance-Leahy Scale: Poor Standing balance comment: heavy reliance on RW                             Pertinent Vitals/Pain Pain Assessment: No/denies pain    Home Living Family/patient expects to be discharged to:: Assisted living Living Arrangements: Alone Available Help at Discharge: Family;Available PRN/intermittently Type of Home: Assisted living Home Access: Level entry     Home Layout: One level Home Equipment: Walker - 2 wheels;Cane - single point;Wheelchair - manual      Prior Function Level of Independence: Needs assistance   Gait / Transfers Assistance Needed: ocassional use of RW in apt, but mainly uses w/c with self propulsion using LEs to get to dining hall  ADL's / [employer]  Needed: [place] aide coming 2x/wk to assist w/ shower transfer and to wash up.  Dresses independently        Hand Dominance   Dominant Hand: Right    Extremity/Trunk Assessment   Upper Extremity Assessment:  Generalized weakness           Lower Extremity Assessment: Generalized weakness;RLE deficits/detail;LLE deficits/detail (limited ROM BLEs)         Communication   Communication: HOH;Other (comment) (has hearing aides)  Cognition Arousal/Alertness: Awake/alert Behavior During Therapy: WFL for tasks assessed/performed Overall Cognitive Status: Within Functional Limits for tasks assessed                      General Comments      Exercises        Assessment/Plan    PT Assessment Patient needs continued PT services  PT Diagnosis Difficulty walking;Abnormality of gait;Generalized weakness   PT Problem List Decreased strength;Decreased range of motion;Decreased activity tolerance;Decreased balance;Decreased mobility;Decreased coordination;Cardiopulmonary status limiting activity;Obesity  PT Treatment Interventions DME instruction;Gait training;Functional mobility training;Therapeutic activities;Therapeutic exercise;Balance training;Patient/family education   PT Goals (Current goals can be found in the Care Plan section) Acute Rehab PT Goals Patient Stated Goal: to get more help PT Goal Formulation: With patient Time For Goal Achievement: 04/12/16 Potential to Achieve Goals: Fair    Frequency Min 2X/week   Barriers to discharge Decreased caregiver support      Co-evaluation PT/OT/SLP Co-Evaluation/Treatment: Yes Reason for Co-Treatment: Complexity of the patient's impairments (multi-system involvement);For patient/therapist safety PT goals addressed during session: Mobility/safety with mobility OT goals addressed during session: ADL's and self-care       End of Session Equipment Utilized During Treatment: Gait belt;Oxygen (3 liters [place]) Activity Tolerance: Patient limited by fatigue;Treatment limited secondary to medical complications (Comment) (elevated HR 158 with activity) Patient left: in bed;with call bell/phone within reach Nurse Communication: Mobility  status         Time: [id number] PT Time Calculation (min) (ACUTE ONLY): 25 min   Charges:   PT Evaluation $PT Eval Moderate Complexity: 1 Procedure     PT G CodesDuncan Dull 04-09-2016, 9:48 AM Alben Deeds, PT DPT  [phone]

## 2016-03-29 NOTE — Progress Notes (Signed)
Pt had 14 beats of VTACH. Pt asleep on observation by nurse, pt easily awaken. Pt alert and oriented. BP=94/43; HR 59. MD called and made aware. Orders to get routine lab now and call MD if Magnesium < 2 and Potassium is <4. Pt has no other complaints at this time. Will continue to monitor pt. Marquie [employer] laboratory technician.

## 2016-03-29 NOTE — Progress Notes (Signed)
[id number]  [facility] RN Liaison Visit This is a GIP related and covered hospital admission of 7/10 per Dr. Shelbie Proctor with a hospice dx of Heart disease. Patient was admitted to  due to A-fib with RVR following an episode of severe chest tightness and shortness of breath at facility.  Visited patient at bedside. Initially sleeping very soundly.   Spoke with staff nurse Cyril Mourning.  Patient has changed his code status now to a Partial code ( CPR and meds only).  I had spoken to the daughter Mardene Celeste yesterday and she has expressed concerns about his current living arrangements.  Hospice SW was notified and will be meeting with the daughter to discuss other options.  Per chart review the patient has been evaluated by both PT and OT.  He was able to ambulate with walker to doorway and back with assistance.   He remains on 02 at 2L per n/c, has a condom catheter in place for comfort.  Per chart review it was noted that patient did have an episode of V-tach during his sleep.  Patient unaware.      Had labs drawn and was given 2mg  Magnesium IV for MG level of 1.7.   Returned to patient's room. He is now awake and alert. He is smiling and states he is feeling better.  He is unaware of any chest tightness or cardiac symptoms.  He reports he is eating some but gets full very fast and prefers light meals.  He is using 02 and states that  It is helping him.   Discussed with him that hospice will provide 02 for him to be delivered to the facility upon discharge.   Discussed with the patient that daughter is interested in pursuing alternative arrangements for him as his care needs are more that what [facility] is able to provide.   Patient is in agreement with this plan.     He did report feeling very tired after walking just a short distance with PT.    Patient reported that he needs his hearing aide batteries from his home.   TC was placed to granddaughter Janett Billow who will pick up  batteries from [facility] to bring to him.     Also had short discussion with patient about his goals.  He states that he realizes that these episodes may continue to occur when he goes home. He stated that he would prefer not to be hospitalized again and would like for facilty staff and hospice to  attempt to manage symptoms in his home environment first before calling 911.  Patient may benefit from having comfort medications available in the facility for PRN use.   [facility] will continue to follow and assist with any discharge needs.  Please call with any questions.  Thank you!  Mickie Kay, [facility] Liaison  [phone]

## 2016-03-29 NOTE — NC FL2 (Signed)
[place] LEVEL OF CARE SCREENING TOOL     IDENTIFICATION  Patient Name: Christopher Holder Birthdate: [birth year] Sex: male Admission Date (Current Location): 03/28/2016  [facility] and [place] Number:  Herbalist and Address:  [facility] [place] [address] [place], [place] [address]      Provider Number: [id number]  Attending Physician Name and Address:  Theodis Blaze, MD  Relative Name and Phone Number:       Current Level of Care: Hospital Recommended Level of Care: [place] Prior Approval Number:    Date Approved/Denied:   PASRR Number:    Discharge Plan: SNF    Current Diagnoses: Patient Active Problem List   Diagnosis Date Noted  . Atrial fibrillation with RVR ([place]) 03/28/2016  . Cardiomyopathy ([place])   . Constipation   . NSVT (nonsustained ventricular tachycardia) ([place])   . Palliative care encounter 02/28/2016  . DNR (do not resuscitate) discussion 02/28/2016  . Dyspnea   . Pulmonary fibrosis ([place])   . Acute on chronic systolic congestive heart failure ([place])   . Chronic obstructive pulmonary disease ([place])   . Heart failure ([place]) 02/26/2016  . Chronic systolic CHF (congestive heart failure), NYHA class 4 ([place]) 02/02/2016  . Pulmonary fibrosis, postinflammatory ([place]) 02/02/2016  . DOE (dyspnea on exertion) 02/02/2016  . Asthma 01/15/2016  . CKD (chronic kidney disease), stage III 12/29/2015  . Renal failure (ARF), acute on chronic (HCC) 12/16/2015  . Hypoxia 12/15/2015  . Pain in lower limb 10/28/2015  . NICM (nonischemic cardiomyopathy) ([place]) 10/02/2015  . Abnormal nuclear stress test 10/01/2015  . Acute respiratory failure ([place])   . Community acquired pneumonia   . Cough with hemoptysis   . Sepsis ([place])   . Chronic systolic congestive heart failure ([place])   . Pulmonary infiltrate   . OSA on CPAP   . Hemoptysis 08/29/2015  . Persistent atrial fibrillation ([place]) 08/26/2015  . Severe sepsis ([place])   .  ARDS (adult respiratory distress syndrome) ([place])   . Renal failure, acute on chronic (HCC)   . Acute systolic CHF (congestive heart failure) ([place])   . Acute respiratory failure with hypoxemia ([place])   . Systolic CHF, acute on chronic (HCC)   . Prolonged QT interval   . Sepsis due to pneumonia ([place]) 08/18/2015  . AKI (acute kidney injury) ([place]) 08/18/2015  . Metabolic encephalopathy [id number]  . Thrombocytopenia ([place]) 08/18/2015  . Acute respiratory failure with hypoxia ([place]) 08/18/2015  . Hard of hearing 08/18/2015  . Chronic sinus bradycardia 08/18/2015  . CAP (community acquired pneumonia) 08/18/2015  . Acute pulmonary edema ([place]) 08/18/2015  . Septic shock ([place]) 08/18/2015  . Chronic cough 08/07/2015  . Onychomycosis 07/28/2015  . Injury by nail 07/28/2015  . Coronary artery disease involving native coronary artery of native heart without angina pectoris 06/19/2015  . Obesity 06/19/2015  . Obstructive sleep apnea 06/19/2015  . Other emphysema ([place]) 06/19/2015  . Diabetes mellitus type 2, controlled ([place]) 05/27/2015  . HTN (hypertension) 05/27/2015  . Hyperlipidemia 05/27/2015  . GERD (gastroesophageal reflux disease) 05/27/2015  . Allergic rhinitis 05/27/2015  . Diabetic neuropathy ([place]) 05/27/2015  . CHF (congestive heart failure) ([place]) 05/27/2015    Orientation RESPIRATION BLADDER Height & Weight     Self, Time, Situation, Place  O2 (3L) Continent Weight: 246 lb 3.2 oz (111.676 kg) Height:  6' (182.9 cm)  BEHAVIORAL SYMPTOMS/MOOD NEUROLOGICAL BOWEL NUTRITION STATUS      Continent Diet (Heart Healthy)  AMBULATORY STATUS COMMUNICATION OF NEEDS Skin   Extensive Assist Verbally Normal                       Personal Care Assistance Level of Assistance  Bathing, Feeding, Dressing Bathing Assistance: Maximum assistance Feeding assistance: Maximum assistance Dressing Assistance: Maximum assistance     Functional Limitations Info  Sight, Hearing, Speech Sight Info:  Adequate Hearing Info: Impaired Speech Info: Adequate    SPECIAL CARE FACTORS FREQUENCY  PT (By licensed PT), OT (By licensed OT)     PT Frequency: Minimal per patient family request              Contractures Contractures Info: Not present    Additional Factors Info  Code Status, Allergies, Insulin Sliding Scale Code Status Info: Partial Code Allergies Info: Percocet, Sulfa Antibiotics   Insulin Sliding Scale Info: Novolog 3 times daily with meals and bedtime       Current Medications (03/29/2016):  This is the current hospital active medication list Current Facility-Administered Medications  Medication Dose Route Frequency Provider Last Rate Last Dose  . acetaminophen (TYLENOL) tablet 650 mg  650 mg Oral Q6H PRN Edwin Dada, MD       Or  . acetaminophen (TYLENOL) suppository 650 mg  650 mg Rectal Q6H PRN Edwin Dada, MD      . albuterol (PROVENTIL) (2.5 MG/3ML) 0.083% nebulizer solution 2.5 mg  2.5 mg Nebulization TID Theodis Blaze, MD   2.5 mg at 03/29/16 0843  . allopurinol (ZYLOPRIM) tablet 100 mg  100 mg Oral BID Edwin Dada, MD   100 mg at 03/29/16 0811  . apixaban (ELIQUIS) tablet 2.5 mg  2.5 mg Oral BID Almyra Deforest, PA   2.5 mg at 03/29/16 0811  . aspirin EC tablet 81 mg  81 mg Oral Daily Edwin Dada, MD   81 mg at 03/29/16 0811  . atorvastatin (LIPITOR) tablet 10 mg  10 mg Oral QHS Edwin Dada, MD   10 mg at 03/28/16 2046  . guaiFENesin-dextromethorphan (ROBITUSSIN DM) 100-10 MG/5ML syrup 5 mL  5 mL Oral Q4H PRN Theodis Blaze, MD   5 mL at 03/29/16 0416  . insulin aspart (novoLOG) injection 0-5 Units  0-5 Units Subcutaneous QHS Edwin Dada, MD   0 Units at 03/28/16 2118  . insulin aspart (novoLOG) injection 0-9 Units  0-9 Units Subcutaneous TID WC Edwin Dada, MD   2 Units at 03/29/16 1247  . mometasone-formoterol (DULERA) 200-5 MCG/ACT inhaler 2 puff  2 puff Inhalation BID Edwin Dada, MD    2 puff at 03/29/16 0844  . montelukast (SINGULAIR) tablet 10 mg  10 mg Oral QHS Edwin Dada, MD   10 mg at 03/28/16 2047  . pantoprazole (PROTONIX) EC tablet 40 mg  40 mg Oral Daily Edwin Dada, MD   40 mg at 03/29/16 0811  . phenol (CHLORASEPTIC) mouth spray 1 spray  1 spray Mouth/Throat PRN Theodis Blaze, MD   1 spray at 03/28/16 1328  . potassium chloride SA (K-DUR,KLOR-CON) CR tablet 40 mEq  40 mEq Oral Daily Edwin Dada, MD   40 mEq at 03/28/16 0932  . tiotropium (SPIRIVA) inhalation capsule 18 mcg  18 mcg Inhalation Daily Edwin Dada, MD   18 mcg at 03/29/16 0844  . torsemide (DEMADEX) tablet 40 mg  40 mg Oral BID Edwin Dada, MD   40 mg at 03/29/16 0811  .  traZODone (DESYREL) tablet 50 mg  50 mg Oral QHS PRN Edwin Dada, MD         Discharge Medications: Please see discharge summary for a list of discharge medications.  Relevant Imaging Results:  Relevant Lab Results:   Additional Information SSN [id number]  Christopher Holder, [place]

## 2016-03-29 NOTE — Progress Notes (Signed)
MD called and made aware pt's magnesium level 1.7. Magnesium IV ordered per MD. Will continue to monitor pt. Rosana Fret RN

## 2016-03-29 NOTE — Progress Notes (Signed)
Patient stated that he has been fine without one and that he will be going home tomorrow. He does not wish to wear a CPAP tonight.

## 2016-03-29 NOTE — Progress Notes (Signed)
Occupational Therapy Evaluation Patient Details Name: Christopher Holder MRN: [id number] DOB: Jul 14, 1930 Today's Date: 03/29/2016    History of Present Illness 82 y.o. male with a past medical history significant for CAD s/p PCI, chronic systolic HF, EF 123XX123, HTN, DM, CKD, OSA with CPAP, Paroxysmal Afib, and COPD who presents with acute onset dyspnea   Clinical Impression   Patient presents to OT with decreased ADL independence and safety due to the deficits listed below. He will benefit from skilled OT to maximize function and to facilitate a safe discharge. OT will follow.    Follow Up Recommendations  SNF;Supervision/Assistance - 24 hour    Equipment Recommendations  Other (comment) (tbd next venue of care)    Recommendations for Other Services       Precautions / Restrictions Precautions Precautions: Fall Precaution Comments: watch HR and O2 saturations, monitor DOE Restrictions Weight Bearing Restrictions: No      Mobility Bed Mobility Overal bed mobility: Needs Assistance Bed Mobility: Sit to Supine       Sit to supine: Min assist;+2 for safety/equipment;+2 for physical assistance   General bed mobility comments: increased time to perform, assist for elevation of LEs and positioning in bed  Transfers Overall transfer level: Needs assistance Equipment used: Rolling walker (2 wheeled) Transfers: Sit to/from Stand Sit to Stand: Min assist;+2 safety/equipment         General transfer comment: VCs for hand placement and safety, cued for upright posture    Balance                                      ADL Overall ADL's : Needs assistance/impaired Eating/Feeding: Set up;Sitting   Grooming: Wash/dry hands;Wash/dry face;Oral care;Set up;Bed level   Upper Body Bathing: Maximal assistance   Lower Body Bathing: Total assistance       Lower Body Dressing: Total assistance Lower Body Dressing Details (indicate cue type and reason): adjust  socks     Toileting- Clothing Manipulation and Hygiene: Total assistance Toileting - Clothing Manipulation Details (indicate cue type and reason): has catheter     Functional mobility during ADLs: Minimal assistance;+2 for safety/equipment;Rolling walker General ADL Comments: Patient sitting EOB upon arrival. Agreeable to OT/PT evaluations. Stood from EOB and ambulated with RW in room/hallway with 1 seated rest break (chair follow). See vital signs below. Returned to bed at end of session.     Vision     Perception     Praxis      Pertinent Vitals/Pain Pain Assessment: No/denies pain     Hand Dominance Right   Extremity/Trunk Assessment Upper Extremity Assessment Upper Extremity Assessment: Generalized weakness   Lower Extremity Assessment Lower Extremity Assessment: Defer to PT evaluation        Communication Communication Communication: HOH;Other (comment) (has hearing aides)   Cognition Arousal/Alertness: Awake/alert Behavior During Therapy: WFL for tasks assessed/performed Overall Cognitive Status: Within Functional Limits for tasks assessed                     General Comments       Exercises       Shoulder Instructions      Home Living Family/patient expects to be discharged to:: Assisted living Living Arrangements: Alone Available Help at Discharge: Family;Available PRN/intermittently Type of Home: Assisted living Home Access: Level entry     Home Layout: One level  Home Equipment: [place] - 2 wheels;Cane - single point;Wheelchair - manual          Prior Functioning/Environment Level of Independence: Needs assistance  Gait / Transfers Assistance Needed: ocassional use of RW in apt, but mainly uses w/c with self propulsion using LEs to get to dining hall ADL's / Homemaking Assistance Needed: [facility] aide coming 2x/wk to assist w/ shower transfer and to wash up.  Dresses independently        OT Diagnosis: Generalized  weakness   OT Problem List: Decreased strength;Decreased activity tolerance;Decreased knowledge of use of DME or AE;Decreased safety awareness;Cardiopulmonary status limiting activity   OT Treatment/Interventions: Self-care/ADL training;Energy conservation;DME and/or AE instruction;Therapeutic activities;Patient/family education    OT Goals(Current goals can be found in the care plan section) Acute Rehab OT Goals Patient Stated Goal: to get more help OT Goal Formulation: With patient Time For Goal Achievement: 2016/04/02 Potential to Achieve Goals: Fair ADL Goals Pt Will Perform Upper Body Dressing: with set-up;sitting Pt Will Perform Lower Body Dressing: with min assist;sit to/from stand Pt Will Transfer to Toilet: with supervision;ambulating Pt Will Perform Toileting - Clothing Manipulation and hygiene: with supervision;sit to/from stand  OT Frequency: Min 2X/week   Barriers to D/C: Decreased caregiver support          Co-evaluation PT/OT/SLP Co-Evaluation/Treatment: Yes Reason for Co-Treatment: Complexity of the patient's impairments (multi-system involvement);For patient/therapist safety PT goals addressed during session: Mobility/safety with mobility OT goals addressed during session: ADL's and self-care      End of Session Equipment Utilized During Treatment: Rolling walker;Oxygen Nurse Communication: Mobility status  Activity Tolerance: Patient tolerated treatment well Patient left: in bed;with call bell/phone within reach   Time: [id number] OT Time Calculation (min): 29 min Charges:  OT General Charges $OT Visit: 1 Procedure OT Evaluation $OT Eval Moderate Complexity: 1 Procedure G-Codes:    Nahiara Kretzschmar A 04-02-2016, 9:50 AM

## 2016-03-30 DIAGNOSIS — I2510 Atherosclerotic heart disease of native coronary artery without angina pectoris: Secondary | ICD-10-CM

## 2016-03-30 DIAGNOSIS — N183 Chronic kidney disease, stage 3 (moderate): Secondary | ICD-10-CM

## 2016-03-30 DIAGNOSIS — I509 Heart failure, unspecified: Secondary | ICD-10-CM

## 2016-03-30 DIAGNOSIS — I959 Hypotension, unspecified: Secondary | ICD-10-CM

## 2016-03-30 LAB — CBC
HEMATOCRIT: 32.5 % — AB (ref 39.0–52.0)
HEMOGLOBIN: 10.2 g/dL — AB (ref 13.0–17.0)
MCH: 28.3 pg (ref 26.0–34.0)
MCHC: 31.4 g/dL (ref 30.0–36.0)
MCV: 90.3 fL (ref 78.0–100.0)
Platelets: 310 10*3/uL (ref 150–400)
RBC: 3.6 MIL/uL — ABNORMAL LOW (ref 4.22–5.81)
RDW: 17.4 % — ABNORMAL HIGH (ref 11.5–15.5)
WBC: 6.8 10*3/uL (ref 4.0–10.5)

## 2016-03-30 LAB — BASIC METABOLIC PANEL
Anion gap: 11 (ref 5–15)
BUN: 25 mg/dL — AB (ref 6–20)
CHLORIDE: 95 mmol/L — AB (ref 101–111)
CO2: 31 mmol/L (ref 22–32)
CREATININE: 1.6 mg/dL — AB (ref 0.61–1.24)
Calcium: 8.3 mg/dL — ABNORMAL LOW (ref 8.9–10.3)
GFR calc Af Amer: 44 mL/min — ABNORMAL LOW (ref >60)
GFR calc non Af Amer: 38 mL/min — ABNORMAL LOW (ref >60)
GLUCOSE: 138 mg/dL — AB (ref 65–99)
POTASSIUM: 4.1 mmol/L (ref 3.5–5.1)
SODIUM: 137 mmol/L (ref 135–145)

## 2016-03-30 LAB — GLUCOSE, CAPILLARY
GLUCOSE-CAPILLARY: 283 mg/dL — AB (ref 65–99)
GLUCOSE-CAPILLARY: 202 mg/dL — AB (ref 65–99)
Glucose-Capillary: 144 mg/dL — ABNORMAL HIGH (ref 65–99)
Glucose-Capillary: 112 mg/dL — ABNORMAL HIGH (ref 65–99)

## 2016-03-30 MED ORDER — DIGOXIN 250 MCG PO TABS
0.2500 mg | ORAL_TABLET | Freq: Once | ORAL | Status: AC
  Administered 2016-03-30: 0.25 mg via ORAL
  Filled 2016-03-30: qty 1

## 2016-03-30 MED ORDER — DIGOXIN 125 MCG PO TABS
0.1250 mg | ORAL_TABLET | Freq: Every day | ORAL | Status: DC
  Administered 2016-03-31 – 2016-04-02 (×3): 0.125 mg via ORAL
  Filled 2016-03-30 (×3): qty 1

## 2016-03-30 NOTE — Clinical Social Work Placement (Signed)
   CLINICAL SOCIAL WORK PLACEMENT  NOTE  Date:  03/30/2016  Patient Details  Name: Christopher Holder MRN: [id number] Date of Birth: 1930/07/19  Clinical Social Work is seeking post-discharge placement for this patient at the [place] level of care (*CSW will initial, date and re-position this form in  chart as items are completed):  Yes   Patient/family provided with [place] Work Department's list of facilities offering this level of care within the geographic area requested by the patient (or if unable, by the patient's family).  Yes   Patient/family informed of their freedom to choose among providers that offer the needed level of care, that participate in Medicare, Medicaid or managed care program needed by the patient, have an available bed and are willing to accept the patient.  Yes   Patient/family informed of [facility]'s ownership interest in [facility] and [facility], as well as of the fact that they are under no obligation to receive care at these facilities.  PASRR submitted to EDS on       PASRR number received on       Existing PASRR number confirmed on 03/30/16     FL2 transmitted to all facilities in geographic area requested by pt/family on 03/29/16     FL2 transmitted to all facilities within larger geographic area on       Patient informed that his/her managed care company has contracts with or will negotiate with certain facilities, including the following:        Yes   Patient/family informed of bed offers received.  Patient chooses bed at       Physician recommends and patient chooses bed at      Patient to be transferred to   on  .  Patient to be transferred to facility by       Patient family notified on   of transfer.  Name of family member notified:        PHYSICIAN Please prepare priority discharge summary, including medications     Additional Comment:    Barbette Or, [place]

## 2016-03-30 NOTE — Progress Notes (Signed)
Patient ID: Christopher Holder, male   DOB: 06/29/1930, 82 y.o.   MRN: [id number]    PROGRESS NOTE    Christopher Holder  [id number] DOB: [birth year] DOA: 03/28/2016  PCP: Mackie Pai, PA-C   Brief Narrative:  82 y.o. male with a past medical history significant for CAD s/p PCI, chronic systolic HF, EF 123XX123, HTN, DM, CKD, OSA with CPAP, Paroxysmal Afib, and COPD who presented with acute onset dyspnea.  Pt was admitted from 6/9 to 6/16 for acute on chronic CHF, COPD, and CAP, improved with diuresis, doxycycline, bronchodilators and steroids. Was seen by Cardiology and Palliative Care during that hospitalization, and ultimately was placed at [facility] for long-term care (he had previously rehabbed at [facility], but otherwise lived with his granddaughter). Discharged to Hospice for CHF and COPD and pulmonary fibrosis after ARDS last Dec.  ED course: - Low grade temp 33F, HR 130s in Afib, and respirations fast, hypotensive in 80s - Na 134, K 5.2, Cr 1.5 (baseline 1.4-1.5), WBC 9.3K, Hgb 10.4 - The case was discussed with Cardiology who recommended cardioversion, which was performed and the patient was converted to sinus rhythm and BP came up to [id number] systolic - TRH were asked to evaluate for admission  Assessment & Plan:   Principal Problem: Acute respiratory failure in the setting of hypotension related to afib RVR - no significant fluid overload, symptoms out of proportion with degree of fluid overload.  - continue torsemide 40mg  BID dosing - weight is actually lower than his discharge weight, 111 kg this AM  - Renal function stable. He does have non pitting edema in bilateral LE, but no pitting edema  Chronic systolic HF/NICM with baseline EF 25-30% - last cath 09/2015 normal coronaries, with patent stent in D1  PAF with RVR on eliquis: underwent urgent DCCV in the ED - CHA2DS2-Vasc score 6 (age, HTN, DM, CAD, HF) - DCCV in the ED, currently in NSR with episodic SVT  burst - started low dose BB but HR still in 120's  - cardiology re consulted   Chest pressure - no further workup, occurred in the setting of acute respiratory distress.  - Note last cath 09/2015 normal coronaries, patent stent in D1.   CKD stage III - stable - Cr trending down  - BMP in AM  DM type II with complications of nephropathy - reasonable inpatient control   Obesity  Body mass index is 33.38 kg/(m^2).  DVT prophylaxis: Eliquis  Code Status: Partial no CPR or NIPV, [place] with meds per ACLS Family Communication: Patient at bedside  Disposition Plan: SNF in 1-2 days, when HR stabilized, cardiology had to be reconsulted   Consultants:   Cardio  Procedures:   Cardioversion in ED  Antimicrobials:   None  Subjective: Still with dyspnea and chest pressure.   Objective: Filed Vitals:   03/30/16 0527 03/30/16 0904 03/30/16 0947 03/30/16 0948  BP: 108/69 102/60    Pulse: 133 120    Temp: 97.9 F (36.6 C)     TempSrc: Oral     Resp: 24     Height:      Weight: 111.812 kg (246 lb 8 oz)     SpO2: 95%  94% 93%    Intake/Output Summary (Last 24 hours) at 03/30/16 1151 Last data filed at 03/30/16 0830  Gross per 24 hour  Intake    660 ml  Output   1750 ml  Net  -1090 ml   Filed Weights   03/28/16  [id number] 03/29/16 0458 03/30/16 0527  Weight: 111.63 kg (246 lb 1.6 oz) 111.676 kg (246 lb 3.2 oz) 111.812 kg (246 lb 8 oz)    Examination:  General exam: Appears to be calm, still with mild dyspnea  Respiratory system: diminished breath sounds at bases  Cardiovascular system: S1 & S2 heard, tachy. No JVD, rubs, gallops or clicks.  Gastrointestinal system: Abdomen is nondistended, soft and nontender. No organomegaly or masses felt.  Central nervous system: Alert and oriented. No focal neurological deficits. Extremities: Symmetric 5 x 5 power. Non pitting edema in bilateral LE     Data Reviewed: I have personally reviewed following labs and imaging  studies  CBC:  Recent Labs Lab 03/28/16 0303 03/29/16 0204 03/30/16 0436  WBC 9.3 9.3 6.8  HGB 10.4* 10.1* 10.2*  HCT 31.3* 31.8* 32.5*  MCV 88.4 89.8 90.3  PLT 416* 319 [id number]   Basic Metabolic Panel:  Recent Labs Lab 03/28/16 0303 03/29/16 0204 03/30/16 0436  NA 134* 136 137  K 5.2* 4.5 4.1  CL 95* 96* 95*  CO2 29 29 31   GLUCOSE 139* 147* 138*  BUN 24* 28* 25*  CREATININE 1.51* 1.92* 1.60*  CALCIUM 8.3* 8.3* 8.3*  MG 2.1 1.7  --     Recent Labs  10/06/15 1153 10/21/15 1106 01/05/16 1148  PROBNP 474.0* 318.0* 538.0*   CBG:  Recent Labs Lab 03/29/16 1134 03/29/16 1613 03/29/16 2044 03/30/16 0741 03/30/16 1148  GLUCAP 160* 118* 221* 144* 283*   Radiology Studies: No results found.    Scheduled Meds: . albuterol  2.5 mg Nebulization TID  . allopurinol  100 mg Oral BID  . apixaban  2.5 mg Oral BID  . aspirin EC  81 mg Oral Daily  . atorvastatin  10 mg Oral QHS  . insulin aspart  0-5 Units Subcutaneous QHS  . insulin aspart  0-9 Units Subcutaneous TID WC  . metoprolol tartrate  12.5 mg Oral BID  . mometasone-formoterol  2 puff Inhalation BID  . montelukast  10 mg Oral QHS  . pantoprazole  40 mg Oral Daily  . potassium chloride  40 mEq Oral Daily  . tiotropium  18 mcg Inhalation Daily  . torsemide  40 mg Oral BID   Continuous Infusions:    LOS: 2 days    Time spent: 20 minutes   Faye Ramsay, MD [facility] Pager [phone]  If 7PM-7AM, please contact night-coverage [email or web address] Password [facility] 03/30/2016, 11:51 AM

## 2016-03-30 NOTE — Progress Notes (Signed)
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 He was noted to be in afib w/ RVR. He was cardioverted in the ED back to NSR. Cardiology signed off 03/28/16. Cardiology asked to revisit for persistent sinus tach, despite BB therapy.  Subjective: Feels ok. Still with dyspnea requiring supplemental O2. He feels palpitations but no dizziness, syncope/ near syncope.   Objective: Vital signs in last 24 hours: Temp:  [97.5 F (36.4 C)-98.6 F (37 C)] 97.5 F (36.4 C) (07/12 1152) Pulse Rate:  [112-133] 120 (07/12 0904) Resp:  [22-27] 24 (07/12 0527) BP: (88-108)/(59-82) 88/59 mmHg (07/12 1152) SpO2:  [91 %-96 %] 93 % (07/12 0948) Weight:  [246 lb 8 oz (111.812 kg)] 246 lb 8 oz (111.812 kg) (07/12 0527) Last BM Date: 03/29/16  Intake/Output from previous day: 07/11 0701 - 07/12 0700 In: 840 [P.O.:840] Out: 1750 [Urine:1750] Intake/Output this shift: Total I/O In: 180 [P.O.:180] Out: -   Medications Current Facility-Administered Medications  Medication Dose Route Frequency Provider Last Rate Last Dose  . acetaminophen (TYLENOL) tablet 650 mg  650 mg Oral Q6H PRN Edwin Dada, MD       Or  . acetaminophen (TYLENOL) suppository 650 mg  650 mg Rectal Q6H PRN Edwin Dada, MD      . albuterol (PROVENTIL) (2.5 MG/3ML) 0.083% nebulizer solution 2.5 mg  2.5 mg Nebulization TID Theodis Blaze, MD   2.5 mg at 03/30/16 0945  . allopurinol (ZYLOPRIM) tablet 100 mg  100 mg Oral BID Edwin Dada, MD   100 mg at 03/30/16 0901  . apixaban (ELIQUIS) tablet 2.5 mg  2.5 mg Oral BID Almyra Deforest, PA    2.5 mg at 03/30/16 0902  . aspirin EC tablet 81 mg  81 mg Oral Daily Edwin Dada, MD   81 mg at 03/30/16 0902  . atorvastatin (LIPITOR) tablet 10 mg  10 mg Oral QHS Edwin Dada, MD   10 mg at 03/29/16 2027  . guaiFENesin-dextromethorphan (ROBITUSSIN DM) 100-10 MG/5ML syrup 5 mL  5 mL Oral Q4H PRN Theodis Blaze, MD   5 mL at 03/29/16 2311  . insulin aspart (novoLOG) injection 0-5 Units  0-5 Units Subcutaneous QHS Edwin Dada, MD   2 Units at 03/29/16 2240  . insulin aspart (novoLOG) injection 0-9 Units  0-9 Units Subcutaneous TID WC Edwin Dada, MD   1 Units at 03/30/16 0908  . metoprolol (LOPRESSOR) injection 5 mg  5 mg Intravenous Q6H PRN Theodis Blaze, MD   5 mg at 03/29/16 2309  . metoprolol tartrate (LOPRESSOR) tablet 12.5 mg  12.5 mg Oral BID Theodis Blaze, MD   12.5 mg at 03/30/16 0904  . mometasone-formoterol (DULERA) 200-5 MCG/ACT inhaler 2 puff  2 puff Inhalation BID Edwin Dada, MD   2 puff at 03/30/16 0800  . montelukast (SINGULAIR) tablet 10 mg  10 mg Oral QHS Edwin Dada, MD   10 mg at 03/29/16 2028  . pantoprazole (PROTONIX) EC tablet 40 mg  40 mg Oral Daily Edwin Dada, MD   40 mg at 03/30/16 0905  .  phenol (CHLORASEPTIC) mouth spray 1 spray  1 spray Mouth/Throat PRN Theodis Blaze, MD   1 spray at 03/28/16 1328  . potassium chloride SA (K-DUR,KLOR-CON) CR tablet 40 mEq  40 mEq Oral Daily Edwin Dada, MD   40 mEq at 03/30/16 0906  . tiotropium (SPIRIVA) inhalation capsule 18 mcg  18 mcg Inhalation Daily Edwin Dada, MD   18 mcg at 03/30/16 0945  . torsemide (DEMADEX) tablet 40 mg  40 mg Oral BID Edwin Dada, MD   40 mg at 03/30/16 0905  . traZODone (DESYREL) tablet 50 mg  50 mg Oral QHS PRN Edwin Dada, MD   50 mg at 03/29/16 2028    PE: General appearance: alert, cooperative, no distress and elderly Neck: no carotid bruit and no JVD Lungs: decreased BS over the right base,  otherwise clear Heart: RR, tachy rate Extremities: 1+ bilateral LE Pulses: 2+ and symmetric Skin: warm and dry Neurologic: Grossly normal  Lab Results:   Recent Labs  03/28/16 0303 03/29/16 0204 03/30/16 0436  WBC 9.3 9.3 6.8  HGB 10.4* 10.1* 10.2*  HCT 31.3* 31.8* 32.5*  PLT 416* 319 310   BMET  Recent Labs  03/28/16 0303 03/29/16 0204 03/30/16 0436  NA 134* 136 137  K 5.2* 4.5 4.1  CL 95* 96* 95*  CO2 29 29 31   GLUCOSE 139* 147* 138*  BUN 24* 28* 25*  CREATININE 1.51* 1.92* 1.60*  CALCIUM 8.3* 8.3* 8.3*     Assessment/Plan  Principal Problem:   Atrial fibrillation with RVR (HCC) Active Problems:   Diabetes mellitus type 2, controlled (HCC)   HTN (hypertension)   Diabetic neuropathy (HCC)   Coronary artery disease involving native coronary artery of native heart without angina pectoris   Persistent atrial fibrillation (HCC)   OSA on CPAP   CKD (chronic kidney disease), stage III   Acute on chronic systolic congestive heart failure (HCC)   Chronic obstructive pulmonary disease ([place])   Cardiomyopathy ([place])   Atrial fibrillation ([place])  1. PAF: s/p urgent DCCV in ED 03/28/16. He remains in sinus, but with tachy rate in the 120s, despite BB.  He is on low dose metoprolol, 12.5 mg BID. Upward titration has been limited due to hypotension. K is WNL. He is anemic but with stable Hgb ~10. Afebrile. Normal white count. Still with renal insufficiency with Scr at 1.6 and BUN at 25. Systolic BPs have ranged in the 80s-low 100s. ? Addition of low dose digoxin vs amiodarone for additional rate control. He has decreased EF of 20-25%, but would not consider [place] given increased risk for recurrent atrial fibrillation. MD to assess and will further advise.        LOS: 2 days    Brittainy M. Ladoris Gene 03/30/2016 12:28 PM

## 2016-03-30 NOTE — Care Management Note (Signed)
Case Management Note  Patient Details  Name: Gorden Courteau MRN: [id number] Date of Birth: 1930/04/02  Subjective/Objective:   Patient presented for A fib. Cardioverted in the ED. Pt is from [facility] and is active with [facility]. Hospice Liaison Collie Siad is following pt here in the hospital.                  Action/Plan: If pt returns to [place] will assist with 02 needs. CSW is working with family in regards to ALF vs SNF. No further needs from CM at this time.   Expected Discharge Date:                  Expected Discharge Plan:  Assisted Living / Rest Home (Active with [facility] )  In-House Referral:  NA  Discharge planning Services  CM Consult  Post Acute Care Choice:  Hospice, Durable Medical Equipment Choice offered to:  Patient  DME Arranged:  Oxygen DME Agency:  Hospice and [place]:  RN [facility] Agency:  [facility]  Status of Service:  In process, will continue to follow  If discussed at Long Length of Stay Meetings, dates discussed:    Additional Comments:  Bethena Roys, RN 03/30/2016, 11:21 AM

## 2016-03-30 NOTE — Progress Notes (Signed)
[id number] RN Liaison Visit This is a GIP related and covered hospital admission of 7/10 per Dr. Shelbie Proctor with a hospice dx of Heart disease. Patient was admitted  due to A-fib with RVR following an episode of severe chest tightness and shortness of breath at facility.  Visited patient at bedside. Granddaughter Janett Billow also present. Patient is sitting up in chair. He is alert, oriented, very pleasant. Patient reports he is feeling better, but still having difficulty with elevated and irregular heart rate and dyspnea with mild exertion.  He is currently on 02 at 3L. Patient has received one dose of Lopressor for HR >115 in past 24 hours per chart review.  Has also received Robitussin DM x 3 for cough and Trazadone at bedtime for sleep.   Patient refused his CPAP last night as he didn't feel he needed it.  He does have CPAP available at home.  PO intake improving  Per patient. Stated he ate 100% of breakfast meal today.  He does require assistance with transfers.    Per conversation with [place], she had met with patient's daughter yesterday to discuss placement options.  No decisions were made.  Daughter waiting to speak with MD.   [facility] will continue to follow and anticipate discharge needs. Please call with any questions.   Mickie Kay, [facility] Liaison  [phone]

## 2016-03-30 NOTE — Clinical Social Work Note (Signed)
Clinical Social Work Assessment  Patient Details  Name: Makson Fusillo MRN: [id number] Date of Birth: 1929-10-01  Date of referral:  03/29/16               Reason for consult:  Facility Placement                Permission sought to share information with:  Family Supports Permission granted to share information::  Yes, Verbal Permission Granted  Name::     Mollie Calleros  Relationship::  Daughter  Contact Information:  [phone]  Housing/Transportation Living arrangements for the past 2 months:  [place] of Information:  Adult Children Patient Interpreter Needed:  None Criminal Activity/Legal Involvement Pertinent to Current Situation/Hospitalization:  No - Comment as needed Significant Relationships:  Adult Children, Other Family Members Lives with:  Facility Resident [facility]) Do you feel safe going back to the place where you live?  No Need for family participation in patient care:  Yes (Comment)  Care giving concerns:  Patient daughter expressed concern that patient had been to [facility] in the past and experienced a medical decline.  Patient daughter is fearful that it could happen again, however since patient does have Hospice following she may be more apt to try if no other facility available.   Social Worker assessment / plan:  Holiday representative spoke with patient daughter over the phone to offer support and discuss patient needs at discharge.  Patient daughter states that patient has been living at [facility] for almost 3 weeks and feels that he would not be able to return in his current condition.  Patient daughter would like to initiate the possibility of long term care SNF placement with Hospice following under patient Medicaid benefit.  CSW has initiated search and spoken with daughter about potential bed offers.  Patient daughter states that she would have preference to [facility] but would be open to [facility] and even  [facility].  CSW is awaiting return call from [facility] to determine if they are able to offer a long term bed.  CSW spoke with Hospice SW [facility]) who confirmed that patient can still receive Hospice services if under long term benefit.  CSW to update Hospice SW once discharge plans are established.  CSW remains available for support and to facilitate patient discharge needs once medically stable.  Employment status:  Retired Forensic scientist:  Medicaid [place], Other ([employer]) ([facility]) PT Recommendations:  [place] / Referral to community resources:  [place]  Patient/Family's Response to care:  Patient daughter agreeable with long term SNF placement and Hospice following.  Patient daughter verbalized understanding of CSW role and appreciation for support and concern.  Patient/Family's Understanding of and Emotional Response to Diagnosis, Current Treatment, and Prognosis:  Patient family understanding of patient current condition and in agreement that patient is remain as comfortable as possible.  Patient family realistic about patient long term plans and medical status.  Emotional Assessment Appearance:  Appears stated age Attitude/Demeanor/Rapport:  Unable to Assess Affect (typically observed):  Unable to Assess Orientation:  Oriented to Self, Oriented to Place, Oriented to  Time, Oriented to Situation Alcohol / Substance use:  Not Applicable Psych involvement (Current and /or in the community):  No (Comment)  Discharge Needs  Concerns to be addressed:  Care Coordination, Adjustment to Illness, Discharge Planning Concerns Readmission within the last 30 days:  No Current discharge risk:  Chronically ill Barriers to  Discharge:  Continued Medical Work up  [employer], [place]

## 2016-03-31 DIAGNOSIS — I481 Persistent atrial fibrillation: Secondary | ICD-10-CM

## 2016-03-31 DIAGNOSIS — G4733 Obstructive sleep apnea (adult) (pediatric): Secondary | ICD-10-CM

## 2016-03-31 LAB — BASIC METABOLIC PANEL
Anion gap: 11 (ref 5–15)
BUN: 23 mg/dL — ABNORMAL HIGH (ref 6–20)
CHLORIDE: 96 mmol/L — AB (ref 101–111)
CO2: 32 mmol/L (ref 22–32)
CREATININE: 1.39 mg/dL — AB (ref 0.61–1.24)
Calcium: 8.5 mg/dL — ABNORMAL LOW (ref 8.9–10.3)
GFR calc non Af Amer: 45 mL/min — ABNORMAL LOW (ref >60)
GFR, EST AFRICAN AMERICAN: 52 mL/min — AB (ref >60)
Glucose, Bld: 132 mg/dL — ABNORMAL HIGH (ref 65–99)
POTASSIUM: 4.4 mmol/L (ref 3.5–5.1)
Sodium: 139 mmol/L (ref 135–145)

## 2016-03-31 LAB — CBC
HEMATOCRIT: 32.3 % — AB (ref 39.0–52.0)
Hemoglobin: 10.3 g/dL — ABNORMAL LOW (ref 13.0–17.0)
MCH: 28.8 pg (ref 26.0–34.0)
MCHC: 31.9 g/dL (ref 30.0–36.0)
MCV: 90.2 fL (ref 78.0–100.0)
PLATELETS: 344 10*3/uL (ref 150–400)
RBC: 3.58 MIL/uL — AB (ref 4.22–5.81)
RDW: 17.3 % — ABNORMAL HIGH (ref 11.5–15.5)
WBC: 7.5 10*3/uL (ref 4.0–10.5)

## 2016-03-31 LAB — GLUCOSE, CAPILLARY
GLUCOSE-CAPILLARY: 133 mg/dL — AB (ref 65–99)
GLUCOSE-CAPILLARY: 193 mg/dL — AB (ref 65–99)
Glucose-Capillary: 145 mg/dL — ABNORMAL HIGH (ref 65–99)
Glucose-Capillary: 173 mg/dL — ABNORMAL HIGH (ref 65–99)

## 2016-03-31 MED ORDER — IPRATROPIUM-ALBUTEROL 0.5-2.5 (3) MG/3ML IN SOLN
3.0000 mL | Freq: Three times a day (TID) | RESPIRATORY_TRACT | Status: DC
  Administered 2016-03-31: 3 mL via RESPIRATORY_TRACT
  Filled 2016-03-31 (×2): qty 3

## 2016-03-31 MED ORDER — METOPROLOL TARTRATE 25 MG PO TABS
25.0000 mg | ORAL_TABLET | Freq: Four times a day (QID) | ORAL | Status: DC
  Administered 2016-04-01 – 2016-04-02 (×7): 25 mg via ORAL
  Filled 2016-03-31 (×7): qty 1

## 2016-03-31 MED ORDER — IPRATROPIUM-ALBUTEROL 0.5-2.5 (3) MG/3ML IN SOLN: 3.0000 mL | RESPIRATORY_TRACT | Status: DC | PRN

## 2016-03-31 NOTE — Procedures (Signed)
Placed patient on CPAP for the night.  Patient is tolerating well at this time. 

## 2016-03-31 NOTE — Progress Notes (Signed)
[id number] [facility] RN Visit   This is a GIP related and covered hospital admission of 7/10 per Dr. Shelbie Proctor with a hospice dx of Heart disease. Patient was admitted due to A-fib with RVR following an episode of severe chest tightness and shortness of breath at facility.  Patient seen in room, resting in bed. No family present at time of visit.  Patient alert, oriented and very pleasant in conversation. He is currently on Spray at 2.5 L/min. He is short of breath in conversation and tachycardic.  During the visit, his heart rate remained in the 130's-150 range.  Patient voiced this is ongoing and he denies any pain, but states he can feel his heart racing.  He verbalized his goal for comfort and he his wishes he could breath with less effort.  He is able to cough, but reports he is too weak for his cough to be productive.  He is receiving nebulizer treatments TID.  He had one dose of Robitussin DM 5 ml yesterday for cough.  He reports his appetite is suffering due to his continued shortness of breath, however, he has been eating 100% of his meal trays.  Patient reflected on his level of deconditioning prior to this hospitalization.  He stated he was having to push himself around in a W/C to get to his meals.  He stated he looks forward to being in a facility that has a hospital bed and can bring him meals if needed.  Offered emotional support and left contact information if any needs arise.  Spoke to Ai, hospital SW, who will update with discharge plans.  [facility] will continue to follow.  Please call with any hospice-related questions or concerns.  Thank you,   Freddi Starr RN, [facility] Liaison  [phone]

## 2016-03-31 NOTE — Consult Note (Addendum)
ELECTROPHYSIOLOGY CONSULT NOTE    Patient ID: Christopher Holder MRN: [id number], DOB/AGE: 25-Oct-1929 82 y.o.  Admit date: 03/28/2016 Date of Consult: 03/31/2016  Primary Physician: Mackie Pai, PA-C Primary Cardiologist: dr. Marlou Porch Requesting MD: Dr. 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   In review of notes, he was previously evaluated by EP, and given QRS of only 120 to 130s, Dr. Rayann Heman felt he likely would not benefit from CRT therapy in January 2017. His last cardiac catheterization on 10/02/2015 showed normal coronaries, widely patent D1 stent, no culprit lesion was found despite having a high risk Myoview on 1/6. We are unable to add amiodarone for afib given his pulmonary fibrosis. He has had 4 admissions in last 6 months with pneumonia, COPD and heart failure. Most recent admission was from 6/9-6/16. He was seen by heart failure service during that hospitalization, he underwent IV diuresis, discharge weight was 254 pounds and eventually discharged to home height assisted living facility with hospice.  He does not feel that he is less SOB when in sinus.  Readmitted 03/28/16 with acute worsening of SOB and brought via EMS to [facility], noted to be in rapid AF,He was given IV diltiazem without improvement. Heart rate was 130 to 150s. His blood pressure was hypotensive and he underwent DC cardioversion in the ED with restoration of normal sinus rhythm .  Cardiology was recalled to the case with persistent tachycardia thought to be ST, Dr. Debara Pickett noted  recurrent PAFib/flutter, and digoxin was started yesterday with low dose metoprolol with soft BP unable to uptitrate.  The patient states he clearly feels better then when he first arrived, though c/w cough and SOB, he states has some at baseline, but not certain he is back to baseline yet.    Past Medical History  Diagnosis Date  . Diabetes mellitus without complication ([place])   . CHF (congestive heart failure) ([place])   . Coronary artery disease   . COPD (chronic obstructive pulmonary disease) ([place])   . Asthma   . Hypertension   . HOH (hard of hearing)   . Sleep apnea   . Myocardial infarction ([place])   . Hyperlipidemia 05/27/2015  . GERD (gastroesophageal reflux disease) 05/27/2015  . AKI (acute kidney injury) ([place]) 08/18/2015  . Arthritis   . Pulmonary fibrosis [facility])      Surgical History:  Past Surgical History  Procedure Laterality Date  . Coronary angioplasty with stent placement    . Knee surgery    . Total knee arthroplasty      Knee replacement x2  . Cardioversion [name] 08/28/2015    Procedure: CARDIOVERSION;  Surgeon: Jerline Pain, MD;  Location: [facility];  Service: Cardiovascular;  Laterality: [name];  . Cardiac catheterization [name] 10/02/2015    Procedure: Left Heart Cath and Coronary Angiography;  Surgeon: Leonie Man, MD;  Location: [facility];  Service: Cardiovascular;  Laterality: [name];  . Joint replacement       Prescriptions prior to admission  Medication Sig Dispense Refill Last Dose  . albuterol (PROVENTIL HFA;VENTOLIN HFA) 108 (90 BASE)  MCG/ACT inhaler Inhale 2 puffs into the lungs every 8 (eight) hours.    03/28/2016 at Unknown time  . allopurinol (ZYLOPRIM) 100 MG tablet Take 1 tablet by mouth two  times daily 120 tablet 1 03/27/2016 at Unknown time  . aspirin EC 81 MG EC tablet Take 1 tablet (81 mg total) by mouth daily.   03/27/2016 at Unknown time  . atorvastatin (LIPITOR) 10 MG tablet Take 10 mg by mouth at bedtime.    03/27/2016 at Unknown time  .  carvedilol (COREG) 3.125 MG tablet 1 tab po bid 180 tablet 3 03/27/2016 at 2000  . DULERA 200-5 MCG/ACT AERO Use 2 puffs two times daily 26 g 1 03/27/2016 at Unknown time  . ELIQUIS 5 MG TABS tablet Take 1 tablet (5 mg total) by mouth 2 (two) times daily. 120 tablet 2 03/27/2016 at Unknown time  . fluticasone (FLONASE) 50 MCG/ACT nasal spray Use 2 sprays in each  nostril daily 32 g 1 03/27/2016 at Unknown time  . glipiZIDE (GLUCOTROL) 5 MG tablet Take 1 tablet (5 mg total) by mouth daily before breakfast. Reported on 12/29/2015 180 tablet 3 03/27/2016 at Unknown time  . guaifenesin (ROBITUSSIN) 100 MG/5ML syrup Take 200 mg by mouth every 4 (four) hours as needed for cough.   unk  . losartan (COZAAR) 25 MG tablet Take 25 mg by mouth daily.   03/27/2016 at Unknown time  . montelukast (SINGULAIR) 10 MG tablet Take 1 tablet by mouth at  bedtime 90 tablet 1 03/27/2016 at Unknown time  . omeprazole (PRILOSEC) 20 MG capsule Take 1 capsule by mouth  daily 90 capsule 1 03/27/2016 at Unknown time  . polyethylene glycol (MIRALAX / GLYCOLAX) packet Take 17 g by mouth daily as needed for mild constipation.   unk  . potassium chloride (K-DUR,KLOR-CON) 10 MEQ tablet Take 40 mEq by mouth daily.    03/27/2016 at Unknown time  . senna-docusate (SENOKOT-S) 8.6-50 MG tablet Take 1 tablet by mouth 2 (two) times daily.   03/27/2016 at Unknown time  . tiotropium (SPIRIVA) 18 MCG inhalation capsule Place 1 capsule (18 mcg total) into inhaler and inhale daily. 30 capsule 12 03/27/2016 at Unknown time  . torsemide (DEMADEX) 20 MG tablet Take 2 tablets (40 mg total) by mouth 2 (two) times daily. 30 tablet 0 03/27/2016 at Unknown time  . traZODone (DESYREL) 50 MG tablet 1/2 tab po q hs 15 tablet 3 03/27/2016 at Unknown time    Inpatient Medications:  . albuterol  2.5 mg Nebulization TID  . allopurinol  100 mg Oral BID  . apixaban  2.5 mg Oral BID  . aspirin EC  81 mg Oral Daily  . atorvastatin  10 mg Oral QHS  . digoxin  0.125 mg Oral Daily  .  insulin aspart  0-5 Units Subcutaneous QHS  . insulin aspart  0-9 Units Subcutaneous TID WC  . metoprolol tartrate  12.5 mg Oral BID  . mometasone-formoterol  2 puff Inhalation BID  . montelukast  10 mg Oral QHS  . pantoprazole  40 mg Oral Daily  . potassium chloride  40 mEq Oral Daily  . tiotropium  18 mcg Inhalation Daily  . torsemide  40 mg Oral BID    Allergies:  Allergies  Allergen Reactions  . Percocet [Oxycodone-Acetaminophen] Diarrhea, Nausea And Vomiting and Nausea Only  . Sulfa Antibiotics Diarrhea and Nausea And Vomiting  . Lexapro [Escitalopram Oxalate]     Social History   Social History  . Marital  Status: Widowed    Spouse Name: [name]  . Number of Children: [name]  . Years of Education: [name]   Occupational History  . retired    Social History Main Topics  . Smoking status: Never Smoker   . Smokeless tobacco: Never Used  . Alcohol Use: No  . Drug Use: No  . Sexual Activity: Not on file   Other Topics Concern  . Not on file   Social History Narrative     Family History  Problem Relation Age of Onset  . Cancer Mother   . Diabetes Father   . Heart attack Brother   . Heart attack Sister      Review of Systems: jAll other systems reviewed and are otherwise negative except as noted above.  Physical Exam: Filed Vitals:   03/30/16 2327 03/31/16 0633 03/31/16 0832 03/31/16 1031  BP:  100/56  99/61  Pulse: 111 102  128  Temp:  98.4 F (36.9 C)    TempSrc:  Oral    Resp:  25    Height:      Weight:  244 lb 11.2 oz (110.995 kg)    SpO2: 96% 95% 94%     GEN- The patient is ill appearing, though in NAD, alert and oriented x 3 today.   HEENT: normocephalic, atraumatic; sclera clear, conjunctiva pink; hearing intact; oropharynx clear; neck supple, no JVP Lymph- no cervical lymphadenopathy Lungs- diminished breath sounds throughout.  No wheezes, rales, rhonchi Heart- Irregular rate and rhythm, tachycardic, 1/6SM, no rubs or gallops, PMI not laterally  displaced GI- soft, non-tender, non-distended, bowel sounds present Extremities- no clubbing, cyanosis, non-pitting edema MS- no significant deformity or atrophy Skin- warm and dry, no rash or lesion Psych- euthymic mood, full affect Neuro- no gross deficits observed  Labs:   Lab Results  Component Value Date   WBC 7.5 03/31/2016   HGB 10.3* 03/31/2016   HCT 32.3* 03/31/2016   MCV 90.2 03/31/2016   PLT 344 03/31/2016    Recent Labs Lab 03/31/16 0521  NA 139  K 4.4  CL 96*  CO2 32  BUN 23*  CREATININE 1.39*  CALCIUM 8.5*  GLUCOSE 132*      Radiology/Studies:  Dg Chest Port 1 View 03/28/2016  CLINICAL DATA:  Post cardioversion EXAM: PORTABLE CHEST 1 VIEW COMPARISON:  03/28/2016 FINDINGS: Cardiac enlargement. Infiltrates or atelectasis again noted in the lung bases without change. Underlying interstitial process. No pneumothorax. Mediastinal contours appear intact. IMPRESSION: Cardiac enlargement with infiltration or edema in the lung bases. Similar appearance to previous study. Electronically Signed   By: Lucienne Capers M.D.   On: 03/28/2016 05:25    EKG: 03/28/16: SR, LBBB APCs TELEMETRY: Afib, 110-130's  02/03/16: Echocardigram Study Conclusions - Left ventricle: The cavity size was normal. Systolic function was  severely reduced. The estimated ejection fraction was in the  range of 20% to 25%. Diffuse hypokinesis. The study is not  technically sufficient to allow evaluation of LV diastolic  function. - Aortic valve: Trileaflet; mildly thickened, mildly calcified  leaflets. There was moderate regurgitation. - Ascending aorta: The ascending aorta was mildly dilated. 38mm - Mitral valve: There was moderate regurgitation. - Left atrium: The atrium was moderately dilated. - Right atrium: The atrium was moderately dilated. - Pulmonary arteries: Systolic pressure was mildly increased. PA  peak pressure: 40 mm Hg (S). Impressions: - Compared to the prior study,  there has been no significant  interval change.  Assessment and Plan:   PAF with RVR  on eliquis: underwent urgent DCCV in the ED - CHA2DS2-Vasc score 6 on Eliquis (low dose, noting Creat on admit was 1.92, out patient on full dose), his creat is improving, baseline in the last few months appears 1.3-1.5.  If remains improved would consider increasing back to 5mg  dose  - DCCV in the ED to SR, back to AF 03/29/16, rate poorly controlled    On metoprolol 12.5mg  BID and digoxin 0.125mg  daily started yesterday    BP has been a limiting factor    Amiodarone has been avoided given pulmonary history     Await Dr. Rayann Heman and recommendations regarding potential AAD options  Acute respiratory failure in the setting of hypotension related to afib RVR - felt at this point not to be significantly fluid overload - on torsemide 40mg  BID dosing - weight is 244lbs, 10lbs less then his last d/c weight - Renal function improving from admit    He has non pitting edema only LE   Known chronic pulmonary issues Chronic systolic HF/NICM with baseline EF 25-30% - last cath 09/2015 normal coronaries, with patent stent in D1  CKD stage III - stable - Cr trending down   DM     The patient carries a partial DNR status.   Signed, Tommye Standard, PA-C 03/31/2016 11:20 AM    I have seen, examined the patient, and reviewed the above assessment and plan. The patient is elderly and frail and on hospice.  His prognosis is poor.  His primary issues appears to be pulmonary.  Though he has AF with RVR, he seems minimally symptomatic with this.  Changes to above are made where necessary.   His BP is soft, but should tolerate additional rate control.  I think that this should remain our primary strategy.  Continue to titrate metoprolol as long as SBP mostly 90s.  Should his creatinine improve, we could consider tikosyn though I do not think that this will have any major impact on his short or long term outcomes.   Will stop ASA as bleeding risks are high.  Agree with increasing eliquis back to 5mg  BID if Creatinine remains <1.5.  Electrophysiology team to see as needed while here. Please call with questions.   Co Sign: Thompson Grayer, MD 03/31/2016 10:16 PM

## 2016-03-31 NOTE — Clinical Social Work Note (Signed)
Spoke with daughter to provide additional bed offers. She would prefer [facility] but will consider the other options if [facility] does not have a bed at time of discharge. Udated Stacie with [facility].   Liz Beach MSW, [place], [place], [id number]

## 2016-03-31 NOTE — Progress Notes (Addendum)
Patient ID: Christopher Holder, male   DOB: 1930-01-14, 82 y.o.   MRN: [id number]    PROGRESS NOTE    Christopher Holder  [id number] DOB: 27-Aug-1930 DOA: 03/28/2016  PCP: Mackie Pai, PA-C   Brief Narrative:  82 y.o. male with a past medical history significant for CAD s/p PCI, chronic systolic HF, EF [id number], HTN, DM, CKD, OSA with CPAP, Paroxysmal Afib, and COPD who presented with acute onset dyspnea.  Pt was admitted from 6/9 to 6/16 for acute on chronic CHF, COPD, and CAP, improved with diuresis, doxycycline, bronchodilators and steroids. Was seen by Cardiology and Palliative Care during that hospitalization, and ultimately was placed at [facility] for long-term care (he had previously rehabbed at [facility], but otherwise lived with his granddaughter). Discharged to Hospice for CHF and COPD and pulmonary fibrosis after ARDS last Dec.  ED course: - Low grade temp 90F, HR 130s in Afib, and respirations fast, hypotensive in 80s - Na 134, K 5.2, Cr 1.5 (baseline 1.4-1.5), WBC 9.3K, Hgb 10.4 - The case was discussed with Cardiology who recommended cardioversion, which was performed and the patient was converted to sinus rhythm and BP came up to [id number] systolic - TRH were asked to evaluate for admission  Assessment & Plan:   Principal Problem: Acute respiratory failure in the setting of hypotension related to afib RVR - no significant fluid overload, symptoms out of proportion with degree of fluid overload.  - continue torsemide 40mg  BID per cardiology  - weight is actually lower than his discharge weight, 111 --> 110 kg this AM  - Renal function stable. He does have non pitting edema in bilateral LE, but no pitting edema  Chronic systolic HF/NICM with baseline EF 25-30% - last cath 09/2015 normal coronaries, with patent stent in D1  PAF with RVR on eliquis: underwent urgent DCCV in the ED - CHA2DS2-Vasc score 6 (age, HTN, DM, CAD, HF) - DCCV in the ED, currently iwith HR in  120-130's - started low dose BB but HR still in 120's  - cardiology re consulted and will continue to follow up on their recommendations, they will d/w EP today   Chest pressure - no further workup, occurred in the setting of acute respiratory distress.  - Note last cath 09/2015 normal coronaries, patent stent in D1.  - pt denies CP this AM   CKD stage III - stable - Cr trending down, 1.39 this AM - BMP in AM  DM type II with complications of nephropathy - reasonable inpatient control   Anemia of chronic disease - no signs of bleeding - CBC in AM  Obesity  Body mass index is 33.38 kg/(m^2).  DVT prophylaxis: Eliquis  Code Status: Partial no CPR or NIPV, [place] with meds per ACLS Family Communication: Patient at bedside, daughter Fraser Din over the phone (works here at [employer]), granddaughter over the phone also updated  Disposition Plan: SNF [facility]) in 1-2 days, when HR stabilized, cardiology had to be reconsulted so we can not discharge until HR stabilizes   Consultants:   Cardio  Procedures:   Cardioversion in ED  Antimicrobials:   None  Subjective: Still with dyspnea, mostly exertional but no CP  Objective: Filed Vitals:   03/30/16 2327 03/31/16 0633 03/31/16 0832 03/31/16 1031  BP:  100/56  99/61  Pulse: 111 102  128  Temp:  98.4 F (36.9 C)    TempSrc:  Oral    Resp:  25    Height:  Weight:  110.995 kg (244 lb 11.2 oz)    SpO2: 96% 95% 94%     Intake/Output Summary (Last 24 hours) at 03/31/16 1243 Last data filed at 03/31/16 1147  Gross per 24 hour  Intake    240 ml  Output   1800 ml  Net  -1560 ml   Filed Weights   03/29/16 0458 03/30/16 0527 03/31/16 [id number]  Weight: 111.676 kg (246 lb 3.2 oz) 111.812 kg (246 lb 8 oz) 110.995 kg (244 lb 11.2 oz)    Examination:  General exam: Appears to be calm, still with mild dyspnea  Respiratory system: diminished breath sounds at bases  Cardiovascular system: tachy. No JVD, rubs, gallops or clicks.    Gastrointestinal system: Abdomen is nondistended, soft and nontender. No organomegaly or masses felt.  Central nervous system: Alert and oriented. No focal neurological deficits. Extremities: Symmetric 5 x 5 power. Edema in bilateral LE   Data Reviewed: I have personally reviewed following labs and imaging studies  CBC:  Recent Labs Lab 03/28/16 0303 03/29/16 0204 03/30/16 0436 03/31/16 0521  WBC 9.3 9.3 6.8 7.5  HGB 10.4* 10.1* 10.2* 10.3*  HCT 31.3* 31.8* 32.5* 32.3*  MCV 88.4 89.8 90.3 90.2  PLT 416* 319 310 [id number]   Basic Metabolic Panel:  Recent Labs Lab 03/28/16 0303 03/29/16 0204 03/30/16 0436 03/31/16 0521  NA 134* 136 137 139  K 5.2* 4.5 4.1 4.4  CL 95* 96* 95* 96*  CO2 29 29 31  32  GLUCOSE 139* 147* 138* 132*  BUN 24* 28* 25* 23*  CREATININE 1.51* 1.92* 1.60* 1.39*  CALCIUM 8.3* 8.3* 8.3* 8.5*  MG 2.1 1.7  --   --     Recent Labs  10/06/15 1153 10/21/15 1106 01/05/16 1148  PROBNP 474.0* 318.0* 538.0*   CBG:  Recent Labs Lab 03/30/16 1148 03/30/16 1648 03/30/16 2059 03/31/16 0735 03/31/16 1131  GLUCAP 283* 112* 202* 145* 173*   Radiology Studies: No results found.    Scheduled Meds: . albuterol  2.5 mg Nebulization TID  . allopurinol  100 mg Oral BID  . apixaban  2.5 mg Oral BID  . aspirin EC  81 mg Oral Daily  . atorvastatin  10 mg Oral QHS  . digoxin  0.125 mg Oral Daily  . insulin aspart  0-5 Units Subcutaneous QHS  . insulin aspart  0-9 Units Subcutaneous TID WC  . metoprolol tartrate  12.5 mg Oral BID  . mometasone-formoterol  2 puff Inhalation BID  . montelukast  10 mg Oral QHS  . pantoprazole  40 mg Oral Daily  . potassium chloride  40 mEq Oral Daily  . tiotropium  18 mcg Inhalation Daily  . torsemide  40 mg Oral BID   Continuous Infusions:    LOS: 3 days    Time spent: 20 minutes   Faye Ramsay, MD [facility] Pager [phone]  If 7PM-7AM, please contact night-coverage [email or web address] Password  [facility] 03/31/2016, 12:43 PM

## 2016-03-31 NOTE — Progress Notes (Signed)
Patient Profile: Mr. 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 He was noted to be in afib w/ RVR. He was cardioverted in the ED back to NSR. Cardiology signed off 03/28/16. Cardiology asked to revisit for persistent sinus tach, despite BB therapy.  Subjective: Continues to have significant cough. BP remains low. HR not significantly improved with addition of digoxin.  Objective: Vital signs in last 24 hours: Temp:  [97.5 F (36.4 C)-98.4 F (36.9 C)] 98.4 F (36.9 C) (07/13 [id number]) Pulse Rate:  [102-128] 128 (07/13 1031) Resp:  [25-33] 25 (07/13 0633) BP: (88-100)/(56-69) 99/61 mmHg (07/13 1031) SpO2:  [94 %-97 %] 94 % (07/13 0832) Weight:  [244 lb 11.2 oz (110.995 kg)] 244 lb 11.2 oz (110.995 kg) (07/13 [id number]) Last BM Date: 03/29/16  Intake/Output from previous day: 07/12 0701 - 07/13 0700 In: 420 [P.O.:420] Out: 1600 [Urine:1600] Intake/Output this shift:    Medications Current Facility-Administered Medications  Medication Dose Route Frequency Provider Last Rate Last Dose  . acetaminophen (TYLENOL) tablet 650 mg  650 mg Oral Q6H PRN Edwin Dada, MD       Or  . acetaminophen (TYLENOL) suppository 650 mg  650 mg Rectal Q6H PRN Edwin Dada, MD      . albuterol (PROVENTIL) (2.5 MG/3ML) 0.083% nebulizer solution 2.5 mg  2.5 mg Nebulization TID Theodis Blaze, MD   2.5 mg at 03/31/16 [id number]  . allopurinol (ZYLOPRIM) tablet 100 mg  100 mg Oral BID Edwin Dada, MD   100 mg at 03/31/16 1030  . apixaban (ELIQUIS) tablet 2.5 mg  2.5 mg Oral BID Almyra Deforest, PA   2.5 mg at 03/31/16 1031  . aspirin EC  tablet 81 mg  81 mg Oral Daily Edwin Dada, MD   81 mg at 03/31/16 1031  . atorvastatin (LIPITOR) tablet 10 mg  10 mg Oral QHS Edwin Dada, MD   10 mg at 03/30/16 2115  . digoxin (LANOXIN) tablet 0.125 mg  0.125 mg Oral Daily Pixie Casino, MD   0.125 mg at 03/31/16 1031  . guaiFENesin-dextromethorphan (ROBITUSSIN DM) 100-10 MG/5ML syrup 5 mL  5 mL Oral Q4H PRN Theodis Blaze, MD   5 mL at 03/30/16 1551  . insulin aspart (novoLOG) injection 0-5 Units  0-5 Units Subcutaneous QHS Edwin Dada, MD   2 Units at 03/30/16 2116  . insulin aspart (novoLOG) injection 0-9 Units  0-9 Units Subcutaneous TID WC Edwin Dada, MD   1 Units at 03/31/16 0949  . metoprolol (LOPRESSOR) injection 5 mg  5 mg Intravenous Q6H PRN Theodis Blaze, MD   5 mg at 03/29/16 2309  . metoprolol tartrate (LOPRESSOR) tablet 12.5 mg  12.5 mg Oral BID Theodis Blaze, MD   12.5 mg at 03/31/16 1031  . mometasone-formoterol (DULERA) 200-5 MCG/ACT inhaler 2 puff  2 puff Inhalation BID Edwin Dada, MD   2 puff at 03/31/16 0836  . montelukast (SINGULAIR) tablet 10 mg  10 mg Oral QHS Edwin Dada, MD   10 mg at 03/30/16 2116  . pantoprazole (PROTONIX) EC tablet 40 mg  40  mg Oral Daily Edwin Dada, MD   40 mg at 03/31/16 1032  . phenol (CHLORASEPTIC) mouth spray 1 spray  1 spray Mouth/Throat PRN Theodis Blaze, MD   1 spray at 03/28/16 1328  . potassium chloride SA (K-DUR,KLOR-CON) CR tablet 40 mEq  40 mEq Oral Daily Edwin Dada, MD   40 mEq at 03/31/16 1032  . tiotropium (SPIRIVA) inhalation capsule 18 mcg  18 mcg Inhalation Daily Edwin Dada, MD   18 mcg at 03/31/16 0836  . torsemide (DEMADEX) tablet 40 mg  40 mg Oral BID Edwin Dada, MD   40 mg at 03/31/16 1032  . traZODone (DESYREL) tablet 50 mg  50 mg Oral QHS PRN Edwin Dada, MD   50 mg at 03/29/16 2028    PE: General appearance: alert, cooperative, no distress and elderly Neck:  no carotid bruit and no JVD Lungs: decreased BS over the right base, otherwise clear Heart: RR, tachy rate Extremities: 1+ bilateral LE Pulses: 2+ and symmetric Skin: warm and dry Neurologic: Grossly normal  Lab Results:   Recent Labs  03/29/16 0204 03/30/16 0436 03/31/16 0521  WBC 9.3 6.8 7.5  HGB 10.1* 10.2* 10.3*  HCT 31.8* 32.5* 32.3*  PLT 319 310 344   BMET  Recent Labs  03/29/16 0204 03/30/16 0436 03/31/16 0521  NA 136 137 139  K 4.5 4.1 4.4  CL 96* 95* 96*  CO2 29 31 32  GLUCOSE 147* 138* 132*  BUN 28* 25* 23*  CREATININE 1.92* 1.60* 1.39*  CALCIUM 8.3* 8.3* 8.5*     Assessment/Plan  Principal Problem:   Atrial fibrillation with RVR (HCC) Active Problems:   Diabetes mellitus type 2, controlled (HCC)   HTN (hypertension)   Diabetic neuropathy (HCC)   Coronary artery disease involving native coronary artery of native heart without angina pectoris   Persistent atrial fibrillation (HCC)   OSA on CPAP   CKD (chronic kidney disease), stage III   Acute on chronic systolic congestive heart failure (HCC)   Chronic obstructive pulmonary disease (HCC)   Cardiomyopathy ([place])   Atrial fibrillation ([place])  1. PAF: s/p urgent DCCV in ED 03/28/16. Now back in regularized a-fib/flutter with HR in the 120-130 range. Digoxin added yesterday but rate remains elevated. On metoprolol 12.5 mg BID. SBP in the 90's will not allow further uptitration of medication. Not sure what other options we have for rate control. Will d/w EP today.  Pixie Casino, MD, [facility] Attending Cardiologist [facility]     LOS: 3 days  03/31/2016 10:54 AM

## 2016-04-01 DIAGNOSIS — I159 Secondary hypertension, unspecified: Secondary | ICD-10-CM

## 2016-04-01 DIAGNOSIS — E1140 Type 2 diabetes mellitus with diabetic neuropathy, unspecified: Secondary | ICD-10-CM

## 2016-04-01 LAB — BASIC METABOLIC PANEL
ANION GAP: 14 (ref 5–15)
BUN: 23 mg/dL — AB (ref 6–20)
CALCIUM: 8.5 mg/dL — AB (ref 8.9–10.3)
CO2: 30 mmol/L (ref 22–32)
Chloride: 94 mmol/L — ABNORMAL LOW (ref 101–111)
Creatinine, Ser: 1.41 mg/dL — ABNORMAL HIGH (ref 0.61–1.24)
GFR calc Af Amer: 52 mL/min — ABNORMAL LOW (ref >60)
GFR, EST NON AFRICAN AMERICAN: 44 mL/min — AB (ref >60)
Glucose, Bld: 139 mg/dL — ABNORMAL HIGH (ref 65–99)
Potassium: 3.9 mmol/L (ref 3.5–5.1)
SODIUM: 138 mmol/L (ref 135–145)

## 2016-04-01 LAB — CBC
HCT: 32.9 % — ABNORMAL LOW (ref 39.0–52.0)
Hemoglobin: 10.5 g/dL — ABNORMAL LOW (ref 13.0–17.0)
MCH: 28.9 pg (ref 26.0–34.0)
MCHC: 31.9 g/dL (ref 30.0–36.0)
MCV: 90.6 fL (ref 78.0–100.0)
PLATELETS: 365 10*3/uL (ref 150–400)
RBC: 3.63 MIL/uL — AB (ref 4.22–5.81)
RDW: 17.3 % — AB (ref 11.5–15.5)
WBC: 8.3 10*3/uL (ref 4.0–10.5)

## 2016-04-01 LAB — GLUCOSE, CAPILLARY
GLUCOSE-CAPILLARY: 141 mg/dL — AB (ref 65–99)
GLUCOSE-CAPILLARY: 112 mg/dL — AB (ref 65–99)
Glucose-Capillary: 191 mg/dL — ABNORMAL HIGH (ref 65–99)
Glucose-Capillary: 195 mg/dL — ABNORMAL HIGH (ref 65–99)

## 2016-04-01 MED ORDER — IPRATROPIUM-ALBUTEROL 0.5-2.5 (3) MG/3ML IN SOLN
3.0000 mL | Freq: Three times a day (TID) | RESPIRATORY_TRACT | Status: DC
  Administered 2016-04-01 – 2016-04-02 (×5): 3 mL via RESPIRATORY_TRACT
  Filled 2016-04-01 (×5): qty 3

## 2016-04-01 NOTE — Procedures (Signed)
Placed patient on CPAP for the night.  Patient is tolerating well at this time. 

## 2016-04-01 NOTE — Plan of Care (Signed)
Problem: Cardiac: Goal: Ability to achieve and maintain adequate cardiopulmonary perfusion will improve Outcome: Not Progressing Metoprolol increased to 25mg  Q6 BP maintaining at 108/64, HR continues to be in the 100s-130s. Pt has persistent cough with productive white flem. 5:30 gave robitussin. Pt resting comfortably in bed. No complaints of pain at this time. Will continue to monitor. Daughter updated.

## 2016-04-01 NOTE — Progress Notes (Signed)
[id number] RN Visit   This is a GIP related and covered hospital admission of 7/10 per Dr. Shelbie Proctor with a hospice dx of Heart disease. Patient was admitted due to A-fib with RVR following an episode of severe chest tightness and shortness of breath at facility.  Code status remains partial code.   Visited patient at bedside. No family present   He is sitting up in the chair, alert, oriented, very pleasant and happy.   He has eaten 100% of his breakfast tray.    He continues to have rapid and irregular heart rate and c/o shortness of breath and cough.  He stated that he understands now that there isn;t  Anything more that can be done for him and he is accepting of that.   Dr. Debara Pickett also made a visit.  He did have medication changes yesterday in attempt to regulate his heart rate. He was started on Digoxin 0.125mg s , Lopressor has been increased to 25 mg q6h, and nebs changed to Duonebs. He continues on his 66  At 3L which he feels is really helping.  He has not received any PRN medications.   Patient verbalized he is hoping not to return to [facility].  We discussed his possible discharge to a SNF, possibly [facility] and to continue with Hospice care there.  Discharge date still undetermined at this time.  [facility] will continue to follow patient to anticipate discharge needs.    Please call with any questions.  Mickie Kay, [facility] Liaison  [phone]

## 2016-04-01 NOTE — Progress Notes (Signed)
Patient ID: Christopher Holder, male   DOB: 18-Sep-1930, 82 y.o.   MRN: [id number]    PROGRESS NOTE    Christopher Holder  [id number] DOB: Nov 29, 1929 DOA: 03/28/2016  PCP: Mackie Pai, PA-C   Brief Narrative:  82 y.o. male with a past medical history significant for CAD s/p PCI, chronic systolic HF, EF 123XX123, HTN, DM, CKD, OSA with CPAP, Paroxysmal Afib, and COPD who presented with acute onset dyspnea.  Pt was admitted from 6/9 to 6/16 for acute on chronic CHF, COPD, and CAP, improved with diuresis, doxycycline, bronchodilators and steroids. Was seen by Cardiology and Palliative Care during that hospitalization, and ultimately was placed at [facility] for long-term care (he had previously  at rehab at [facility], but otherwise lived with his granddaughter). Discharged to Hospice for CHF and COPD and pulmonary fibrosis after ARDS last Dec.  ED course: - Low grade temp 63F, HR 130s in Afib, and respirations fast, hypotensive in 80s - Na 134, K 5.2, Cr 1.5 (baseline 1.4-1.5), WBC 9.3K, Hgb 10.4 - The case was discussed with Cardiology who recommended cardioversion, which was performed and the patient was converted to sinus rhythm and BP came up to [id number] systolic - TRH were asked to evaluate for admission   Assessment & Plan:   Principal Problem: Acute respiratory failure in the setting of hypotension related to afib RVR - no significant fluid overload, symptoms out of proportion with degree of fluid overload.  - continue torsemide 40mg  BID per cardiology  - weight is actually lower than his discharge weight,  - Renal function stable, baseline creatinine is at 1.4 and it is the same today. He does have some non pitting edema, which is improving.  - remains on 2 lit of [place] oxygen.   Chronic systolic HF/NICM with baseline EF 25-30% - last cath 09/2015 normal coronaries, with patent stent in D1 - no fluid overload.   PAF with RVR on eliquis: underwent urgent DCCV in the ED -  CHA2DS2-Vasc score 6 (age, HTN, DM, CAD, HF) - DCCV in the ED, currently iwith HR in 110- to 120's - increased metoprolol to 25 mg to every 6 hours.  - cardiology re consulted and will continue to follow up on their recommendations, .  Chest pressure - no further workup, occurred in the setting of acute respiratory distress.  - Note last cath 09/2015 normal coronaries, patent stent in D1.  - pt denies CP this AM   CKD stage III - stable - baseline creatinine is at 1.4 and today is 1.4.  Monitor renal parameters intermittently on torsemide.   DM type II with complications of nephropathy - reasonable inpatient control  - CBG (last 3)   Recent Labs  03/31/16 2050 04/01/16 0721 04/01/16 1127  GLUCAP 193* 141* 191*    Resume SSI.  Holding oral meds .  Anemia of chronic disease - no signs of bleeding - CBC in AM - hemoglobin stable at 10/   Obesity  Body mass index is 33.38 kg/(m^2).  DVT prophylaxis: Eliquis  Code Status: Partial no CPR or NIPV, [place] with meds per ACLS Family Communication: none at bedside.   Disposition Plan: SNF ([facility]) in 1-2 days, when HR stabilized, .   Consultants:   Cardiology  Procedures:   Cardioversion in ED  Antimicrobials:   None  Subjective: Reports sob is better. But HR continues to be elevated.  Denies chest pain or sob.   Objective: Filed Vitals:   03/31/16 2121 04/01/16 [id number]  04/01/16 1047 04/01/16 1100  BP:  103/52 99/56 105/62  Pulse: 118 93 101 123  Temp:  98.2 F (36.8 C)    TempSrc:  Axillary    Resp: 24 19 16 27   Height:      Weight:  111.313 kg (245 lb 6.4 oz)    SpO2: 97% 97% 98% 97%    Intake/Output Summary (Last 24 hours) at 04/01/16 1210 Last data filed at 04/01/16 0634  Gross per 24 hour  Intake    240 ml  Output   2480 ml  Net  -2240 ml   Filed Weights   03/30/16 0527 03/31/16 0633 04/01/16 0633  Weight: 111.812 kg (246 lb 8 oz) 110.995 kg (244 lb 11.2 oz) 111.313 kg (245 lb 6.4 oz)     Examination:  General exam: Appears to be calm, still with mild dyspnea on [place] oxygen.  Respiratory system: diminished breath sounds at bases , no wheezing orr honchi.  Cardiovascular system: tachy. No JVD, rubs, gallops or clicks.  Gastrointestinal system: Abdomen is nondistended, soft and nontender. No organomegaly or masses felt.  Central nervous system: Alert and oriented. No focal neurological deficits. Extremities: Symmetric 5 x 5 power. Edema in bilateral LE   Data Reviewed: I have personally reviewed following labs and imaging studies  CBC:  Recent Labs Lab 03/28/16 0303 03/29/16 0204 03/30/16 0436 03/31/16 0521 04/01/16 0440  WBC 9.3 9.3 6.8 7.5 8.3  HGB 10.4* 10.1* 10.2* 10.3* 10.5*  HCT 31.3* 31.8* 32.5* 32.3* 32.9*  MCV 88.4 89.8 90.3 90.2 90.6  PLT 416* 319 310 344 [id number]   Basic Metabolic Panel:  Recent Labs Lab 03/28/16 0303 03/29/16 0204 03/30/16 0436 03/31/16 0521 04/01/16 0440  NA 134* 136 137 139 138  K 5.2* 4.5 4.1 4.4 3.9  CL 95* 96* 95* 96* 94*  CO2 29 29 31  32 30  GLUCOSE 139* 147* 138* 132* 139*  BUN 24* 28* 25* 23* 23*  CREATININE 1.51* 1.92* 1.60* 1.39* 1.41*  CALCIUM 8.3* 8.3* 8.3* 8.5* 8.5*  MG 2.1 1.7  --   --   --     Recent Labs  10/06/15 1153 10/21/15 1106 01/05/16 1148  PROBNP 474.0* 318.0* 538.0*   CBG:  Recent Labs Lab 03/31/16 1131 03/31/16 1625 03/31/16 2050 04/01/16 0721 04/01/16 1127  GLUCAP 173* 133* 193* 141* 191*   Radiology Studies: No results found.    Scheduled Meds: . allopurinol  100 mg Oral BID  . apixaban  2.5 mg Oral BID  . atorvastatin  10 mg Oral QHS  . digoxin  0.125 mg Oral Daily  . insulin aspart  0-5 Units Subcutaneous QHS  . insulin aspart  0-9 Units Subcutaneous TID WC  . ipratropium-albuterol  3 mL Nebulization TID  . metoprolol tartrate  25 mg Oral Q6H  . mometasone-formoterol  2 puff Inhalation BID  . montelukast  10 mg Oral QHS  . pantoprazole  40 mg Oral Daily  . potassium  chloride  40 mEq Oral Daily  . tiotropium  18 mcg Inhalation Daily  . torsemide  40 mg Oral BID   Continuous Infusions:    LOS: 4 days    Time spent: 25 minutes   Omayra Tulloch, MD [facility] Pager [phone]   If 7PM-7AM, please contact night-coverage [email or web address] Password [email or web address] 04/01/2016, 12:10 PM

## 2016-04-01 NOTE — Clinical Social Work Note (Signed)
CSW spoke with Janie from [facility]. The facility and [facility] have spoken and have worked out the patient coming to [facility] with hospice services. Daughter Chong Sicilian was updated. The facility is prepared to take the patient at any time (including over the weekend). CSW will leave report for weekend CSWs.   Liz Beach MSW, [place], [place], [id number]

## 2016-04-01 NOTE — Progress Notes (Deleted)
Attempted to obtain CVP at 2000 and was unable to obtain an accurate number. Noted there was blood in the tubing that would not flush so requested respiratory team to replace bag and tubing. Flushed lines and connected new tubing. Received a negative reading. Consulted IV team and requested they come assess the line. Suggested that maybe there is something in the line preventing back flow. IV team called and advised that if line is flushing, there is nothing they can do about it.

## 2016-04-01 NOTE — Clinical Social Work Note (Signed)
CSW received call from [facility] SN[facility]. Narda Rutherford states that the patient's Medicaid is not long term care Medicaid and he will therefore have to revoke his hospice benefit if he is to come to [facility] at least until his Medicaid and transferred over. CSW will update daughter and determine if the patient and daughter want to move forward with this or return to [facility] with hospice.    Liz Beach MSW, [place], [place], [id number]

## 2016-04-01 NOTE — Progress Notes (Signed)
Physical Therapy Treatment Patient Details Name: Christopher Holder MRN: [id number] DOB: [birth year] Today's Date: 04/01/2016    History of Present Illness 82 y.o. male with a past medical history significant for CAD s/p PCI, chronic systolic HF, EF 123XX123, HTN, DM, CKD, OSA with CPAP, Paroxysmal Afib, and COPD who presents with acute onset dyspnea    PT Comments    Pt seated in stationary chair upon arrival with 3L of supplemental oxygen. Pt used rollator as an assistive device because there was no rolling chair or WC available to follow. Pt was steady during ambulation with the rollator, but fatigued quickly.  HR increased to mid 130s during ambulation. Pt is progressing towards goals and would benefit from continued PT in order to increase his functional independence.   Follow Up Recommendations  SNF;Supervision for mobility/OOB     Equipment Recommendations   (Rollator )    Recommendations for Other Services       Precautions / Restrictions Precautions Precautions: Fall Precaution Comments: watch HR and O2 saturations, monitor DOE Restrictions Weight Bearing Restrictions: No    Mobility  Bed Mobility                  Transfers Overall transfer level: Needs assistance Equipment used: 4-wheeled walker Transfers: Sit to/from Stand Sit to Stand: Min guard;+2 safety/equipment         General transfer comment: Verbal cues needed on how to lock 4-wheeled walker.  Ambulation/Gait Ambulation/Gait assistance: +2 safety/equipment;Min guard Ambulation Distance (Feet): 50 Feet Assistive device: 4-wheeled walker Gait Pattern/deviations: Step-through pattern;Decreased stride length;Shuffle Gait velocity: decreased Gait velocity interpretation: <1.8 ft/sec, indicative of risk for recurrent falls General Gait Details: Pt slow with ambulation. Made it past door frame and to next door before having to turn around. No standing rest breaks. Pt cued to breathe through nose with  supplemental O2. Pt HR increased to 137 during ambulation. Pt SOB during ambulation. O2 sats dropped to mid 60s, but questionable due to no wave form showing on protable monitor.    Stairs            Wheelchair Mobility    Modified Rankin (Stroke Patients Only)       Balance Overall balance assessment: Needs assistance Sitting-balance support: Feet supported Sitting balance-Leahy Scale: Fair       Standing balance-Leahy Scale: Poor Standing balance comment: reliance on rollator                    Cognition Arousal/Alertness: Awake/alert Behavior During Therapy: WFL for tasks assessed/performed Overall Cognitive Status: Within Functional Limits for tasks assessed                      Exercises      General Comments General comments (skin integrity, edema, etc.): Pt speaks at a low volume. His DOE was 2/4 at the beginning of ambulation and 3/4 near the end. He does not want phone near hear because of interference with his hearing aids.       Pertinent Vitals/Pain Pain Assessment: No/denies pain    Home Living                      Prior Function            PT Goals (current goals can now be found in the care plan section) Acute Rehab PT Goals Patient Stated Goal: to get more help    Frequency  Min 2X/week    PT  Plan      Co-evaluation             End of Session Equipment Utilized During Treatment: Gait belt;Oxygen (3 L nasal cannula) Activity Tolerance: Patient limited by fatigue;Treatment limited secondary to medical complications (Comment) (HR increased to 137 during activity) Patient left: in chair;with call bell/phone within reach (pt did not want phone near him)     Time: [id number] PT Time Calculation (min) (ACUTE ONLY): 31 min  Charges:                       G CodesNash Mantis, [place] [phone]  04/01/2016, 2:28 PM

## 2016-04-01 NOTE — Progress Notes (Signed)
Patient Name: Christopher Holder Date of Encounter: 04/01/2016  Primary Cardiologist: Dr. Marlou Porch Primary Electrophysiologist: Dr. Rayann Heman Heart Failure: Dr. Haroldine Laws   Principal Problem:   Atrial fibrillation with RVR [facility]) Active Problems:   Diabetes mellitus type 2, controlled ([place])   HTN (hypertension)   Diabetic neuropathy ([place])   Coronary artery disease involving native coronary artery of native heart without angina pectoris   Persistent atrial fibrillation (HCC)   OSA on CPAP   CKD (chronic kidney disease), stage III   Acute on chronic systolic congestive heart failure (HCC)   Chronic obstructive pulmonary disease ([place])   Cardiomyopathy ([place])    SUBJECTIVE  Denies any CP or SOB.   CURRENT MEDS . allopurinol  100 mg Oral BID  . apixaban  2.5 mg Oral BID  . atorvastatin  10 mg Oral QHS  . digoxin  0.125 mg Oral Daily  . insulin aspart  0-5 Units Subcutaneous QHS  . insulin aspart  0-9 Units Subcutaneous TID WC  . ipratropium-albuterol  3 mL Nebulization TID  . metoprolol tartrate  25 mg Oral Q6H  . mometasone-formoterol  2 puff Inhalation BID  . montelukast  10 mg Oral QHS  . pantoprazole  40 mg Oral Daily  . potassium chloride  40 mEq Oral Daily  . tiotropium  18 mcg Inhalation Daily  . torsemide  40 mg Oral BID    OBJECTIVE  Filed Vitals:   03/31/16 2053 03/31/16 2110 03/31/16 2121 04/01/16 0633  BP: 87/54   103/52  Pulse: 110 112 118 93  Temp: 97.8 F (36.6 C)   98.2 F (36.8 C)  TempSrc: Oral   Axillary  Resp: 23 25 24 19   Height:      Weight:    245 lb 6.4 oz (111.313 kg)  SpO2: 96% 96% 97% 97%    Intake/Output Summary (Last 24 hours) at 04/01/16 0812 Last data filed at 04/01/16 0634  Gross per 24 hour  Intake    660 ml  Output   2680 ml  Net  -2020 ml   Filed Weights   03/30/16 0527 03/31/16 0633 04/01/16 0633  Weight: 246 lb 8 oz (111.812 kg) 244 lb 11.2 oz (110.995 kg) 245 lb 6.4 oz (111.313 kg)    PHYSICAL EXAM  General: Pleasant,  NAD. Neuro: Alert and oriented X 3. Moves all extremities spontaneously. Psych: Normal affect. HEENT:  Normal  Neck: Supple without bruits or JVD. Lungs:  Resp regular and unlabored. Bibasilar rale.  Heart: irregularly irregular. no s3, s4, or murmurs. Abdomen: Soft, non-tender, non-distended, BS + x 4.  Extremities: No clubbing, cyanosis or edema. DP/PT/Radials 2+ and equal bilaterally.  Accessory Clinical Findings  CBC  Recent Labs  03/31/16 0521 04/01/16 0440  WBC 7.5 8.3  HGB 10.3* 10.5*  HCT 32.3* 32.9*  MCV 90.2 90.6  PLT 344 [id number]   Basic Metabolic Panel  Recent Labs  03/31/16 0521 04/01/16 0440  NA 139 138  K 4.4 3.9  CL 96* 94*  CO2 32 30  GLUCOSE 132* 139*  BUN 23* 23*  CREATININE 1.39* 1.41*  CALCIUM 8.5* 8.5*    TELE NSR with HR 90-110s    ECG  No new EKG  Echocardiogram 02/03/2016  LV EF: 20% - 25%  ------------------------------------------------------------------- Indications: CHF - 428.0.  ------------------------------------------------------------------- History: PMH: Dyspnea. Atrial fibrillation. Congestive heart failure. Risk factors: Hypertension. Diabetes mellitus.  ------------------------------------------------------------------- Study Conclusions  - Left ventricle: The cavity size was normal. Systolic function was  severely reduced. The  estimated ejection fraction was in the  range of 20% to 25%. Diffuse hypokinesis. The study is not  technically sufficient to allow evaluation of LV diastolic  function. - Aortic valve: Trileaflet; mildly thickened, mildly calcified  leaflets. There was moderate regurgitation. - Ascending aorta: The ascending aorta was mildly dilated. 64mm - Mitral valve: There was moderate regurgitation. - Left atrium: The atrium was moderately dilated. - Right atrium: The atrium was moderately dilated. - Pulmonary arteries: Systolic pressure was mildly increased. PA  peak  pressure: 40 mm Hg (S).  Impressions:  - Compared to the prior study, there has been no significant  interval change.    Radiology/Studies  Dg Chest Port 1 View  03/28/2016  CLINICAL DATA:  Post cardioversion EXAM: PORTABLE CHEST 1 VIEW COMPARISON:  03/28/2016 FINDINGS: Cardiac enlargement. Infiltrates or atelectasis again noted in the lung bases without change. Underlying interstitial process. No pneumothorax. Mediastinal contours appear intact. IMPRESSION: Cardiac enlargement with infiltration or edema in the lung bases. Similar appearance to previous study. Electronically Signed   By: Lucienne Capers M.D.   On: 03/28/2016 05:25   Dg Chest Portable 1 View  03/28/2016  CLINICAL DATA:  Palpitations and shortness of breath tonight. EXAM: PORTABLE CHEST 1 VIEW COMPARISON:  CT chest 02/27/2016.  Chest 02/26/2016. FINDINGS: Cardiac enlargement. Bilateral peripheral interstitial infiltrates with increased opacities in the lung bases, likely corresponding to small pleural effusions and basilar atelectasis. No pneumothorax. Mediastinal contours appear intact. IMPRESSION: Cardiac enlargement. Chronic interstitial process in the lungs with increasing opacities in the lung bases probably represent small effusions is seen on previous CT. Electronically Signed   By: Lucienne Capers M.D.   On: 03/28/2016 03:25    ASSESSMENT AND PLAN  1. Acute respiratory failure in the setting of hypotension related to afib RVR - felt to be more pulmonary related. Torsemide changed from 40mg  daily at home to BID dosing. Monitor renal function closely. Cr initially improving, now trending back up. Pulm exam not accurate given baseline bibasilar rale from pulm fibrosis.  2. Chronic systolic HF/NICM with baseline EF 25-30% - last cath 09/2015 normal coronaries, with patent stent in D1  3. PAF with RVR on eliquis: underwent urgent DCCV in the ED - CHA2DS2-Vasc score 6 (age, HTN, DM,  CAD, HF) - DCCV in the ED, went back into sinus tach, coreg held due to hypotension, unfortunately went back into aflutter/afib with HR 120-130s, currently in afib. Digoxin added without improvement in HR.   - Unfortunately expect similar symptom to recur given his poor prognosis, continue followup with hospice  - EP consulted yesterday, recommend continue uptitration of BB. Although could consider Tiokosyn, however it likely will not change his long term or short term prognosis. Metoprolol increased from 12.5mg  BID and 25mg  QID, will need to monitor his BP closely.   4. Chest pressure: no further workup, occurred in the setting of acute respiratory distress. Note last cath 09/2015 normal coronaries, patent stent in D1.   5. CKD stage III: stable.   6. 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 HTN, hypotensive now  8. HLD: continue low dose lipitor.  9. DM   Signed, Almyra Deforest PA-C Pager: [phone]

## 2016-04-02 DIAGNOSIS — E785 Hyperlipidemia, unspecified: Secondary | ICD-10-CM | POA: Insufficient documentation

## 2016-04-02 LAB — GLUCOSE, CAPILLARY
GLUCOSE-CAPILLARY: 118 mg/dL — AB (ref 65–99)
GLUCOSE-CAPILLARY: 163 mg/dL — AB (ref 65–99)

## 2016-04-02 MED ORDER — DIGOXIN 125 MCG PO TABS: 0.1250 mg | ORAL_TABLET | Freq: Every day | ORAL | Status: AC

## 2016-04-02 MED ORDER — METOPROLOL TARTRATE 25 MG PO TABS: 25.0000 mg | ORAL_TABLET | Freq: Four times a day (QID) | ORAL | Status: AC

## 2016-04-02 MED ORDER — TORSEMIDE 20 MG PO TABS
20.0000 mg | ORAL_TABLET | Freq: Two times a day (BID) | ORAL | Status: AC
Start: 2016-04-02 — End: ?

## 2016-04-02 NOTE — Progress Notes (Signed)
[facility] called for transport. Report given to RN at [facility].

## 2016-04-02 NOTE — Progress Notes (Signed)
[id number] RN Visit   This is a GIP related and covered hospital admission of 7/10 per Dr. Shelbie Proctor with a hospice dx of Heart disease. Patient was admitted due to A-fib with RVR following an episode of severe chest tightness and shortness of breath at facility. Code status remains partial code  Visited patient at bedside.  No family present.   He is resting quietly and RN did not awaken patient.  Spoke with staff RN who reports patient is doing better.  He does continue to have occasional elevated heart rate, especially with exertion.  She held Torsemide today due to low BP of 81/50.  Patient is scheduled to be discharged later today to [facility] with hospice to continue to follow.  Patient continues to use 02 continuously.  He has not received any PRN medications in the past 24 hours.    [facility] will continue to follow patient till discharge and then hospice will follow patient at [facility].  Please call with any questions.    Mickie Kay, [facility]ason  [phone]

## 2016-04-02 NOTE — Discharge Summary (Signed)
Physician Discharge Summary  Christopher Holder [id number] DOB: [birth year] DOA: 03/28/2016  PCP: Mackie Pai, PA-C  Admit date: 03/28/2016 Discharge date: 04/02/2016  Time spent: 35 minutes  Recommendations for Outpatient Follow-up:  Repeat BMET in 5 days to follow electrolytes and renal function Close follow up on BP and adjust medications as needed Please follow daily weights Heart healthy diet recommended Hospice to follow up along and with plans to transition to comfort care if condition deteriorates/decline further.  Discharge Diagnoses:  Principal Problem:   Atrial fibrillation with RVR ([place]) Active Problems:   Diabetes mellitus type 2, controlled ([place])   HTN (hypertension)   Diabetic neuropathy (HCC)   Coronary artery disease involving native coronary artery of native heart without angina pectoris   Persistent atrial fibrillation (HCC)   OSA on CPAP   CKD (chronic kidney disease), stage III   Acute on chronic systolic congestive heart failure (HCC)   Chronic obstructive pulmonary disease ([place])   Cardiomyopathy ([place])   Atrial fibrillation ([place])   Discharge Condition: stable. Discharge to SNF for further care and treatment. Hospice on board.  Diet recommendation: heart healthy and modified carbohydrates   Filed Weights   03/31/16 [id number] 04/01/16 [id number] 04/02/16 0434  Weight: 110.995 kg (244 lb 11.2 oz) 111.313 kg (245 lb 6.4 oz) 111.041 kg (244 lb 12.8 oz)    History of present illness:  As per Dr. Loleta Books H&P on 03/28/16 82 y.o. male with a past medical history significant for CAD s/p PCI, chronic systolic HF, EF [id number], HTN, DM, CKD, OSA with CPAP, Paroxysmal Afib, and COPD who presents with acute onset dyspnea.  The was admitted from 6/9 to 6/16 for acute on chronic CHF, COPD, and CAP, improved with diuresis, doxycycline, bronchodilators and steroids. Was seen by Cardiology and Palliative Care during that hospitalization, and ultimately was placed at [facility] for  long-term care (he had previously rehabbed at [facility], but otherwise lived with his granddaughter). Discharged to Hospice for CHF and COPD and pulmonary fibrosis after ARDS last Dec.  Since discharge, he is dyspneic with walking across the room, uses a cane or walker. He was stable however, until tonight, he woke up in the middle of the night feeling short of breath and "chest heaviness". There was no radiation of the chest discomfort. There was no preceding leg swelling. There was no cough or fever.  Hospital Course:  Acute on chronic respiratory failure in the setting of Afib with RVR. Patient with underlying pulmonary fibrosis and systolic heart failure. - no significant fluid overload, symptoms out of proportion with degree of fluid overload.  - continue torsemide 20mg  BI, low sodium diet and daily weights  - weight is actually lower than his discharge weight on last admission  - Renal function stable at discharge -will discharge on chronic home oxygen supplementation  Acute on Chronic systolic HF/NICM with baseline EF 25-30% -last cath 09/2015 normal coronaries, with patent stent in D1 -with some LE edema, but denying orthopnea and no frank crackles on exam -will continue b-blockers and torsemide -losartan discontinue given low BP -reassess BP and resume ARB if possible   PAF with RVR on eliquis: underwent urgent DCCV  - CHA2DS2-Vasc score 6 (age, HTN, DM, CAD, HF) - DCCV in the ED on admission, cardiology has adjusted medications and recommended use of metoprolol QID and digoxin -stable to discharge as per cardiology service recommendations  -continue eliquis 5mg  BID  Chest pressure -no further workup, occurred in the setting of acute respiratory  distress.  -Note last cath 09/2015 normal coronaries, patent stent in D1.  -pt denies CP  CKD stage III -stable and at baseline at discharge -repeat BMET as an outpatient to follow electrolytes and renal function  trend   DM type II with complications of nephropathy -fair well inpatient control  -will resume home oral regimen at discharge    Recent Labs (last 2 labs)      Recent Labs  03/31/16 2050 04/01/16 0721 04/01/16 1127  GLUCAP 193* 141* 191*      Anemia of chronic disease - no signs of bleeding - Hgb stable -repeat CBC as an outpatient to follow Hgb trend   Obesity  Body mass index is 33.38 kg/(m^2).      Procedures:  Cardioversion   Consultations:  Cardiology   Discharge Exam: Filed Vitals:   04/02/16 0933 04/02/16 1131  BP: 88/46 99/44  Pulse: 93 90  Temp:  98.6 F (37 C)  Resp: 26 21   General exam: Appears to be calm, in no distress and oriented to person, place and situation. No CP and with improved breathing. Denies palpitations. Respiratory system: no wheezing, positive scattered rhonchi, fair air movement  Cardiovascular system: irregular, No JVD, No rubs or gallops. Gastrointestinal system: Abdomen is nondistended, soft and nontender. No organomegaly or masses felt.  Central nervous system: except for chronic hard of hearing process, No focal neurological deficits. Extremities: trace to 1+ Edema in bilateral LE   Discharge Instructions   Discharge Instructions    Discharge instructions    Complete by:  As directed   Repeat BMET in 5 days to follow electrolytes and renal function Close follow up on BP and adjust medications as needed Please follow daily weights Heart healthy diet recommended Hospice to follow up along and with plans to transition to comfort care if condition deteriorates/decline further     Increase activity slowly    Complete by:  As directed           Current Discharge Medication List    START taking these medications   Details  digoxin (LANOXIN) 0.125 MG tablet Take 1 tablet (0.125 mg total) by mouth daily.    metoprolol tartrate (LOPRESSOR) 25 MG tablet Take 1 tablet (25 mg total) by mouth every 6 (six)  hours.      CONTINUE these medications which have CHANGED   Details  torsemide (DEMADEX) 20 MG tablet Take 1 tablet (20 mg total) by mouth 2 (two) times daily.      CONTINUE these medications which have NOT CHANGED   Details  albuterol (PROVENTIL HFA;VENTOLIN HFA) 108 (90 BASE) MCG/ACT inhaler Inhale 2 puffs into the lungs every 8 (eight) hours.     allopurinol (ZYLOPRIM) 100 MG tablet Take 1 tablet by mouth two  times daily Qty: 120 tablet, Refills: 1    atorvastatin (LIPITOR) 10 MG tablet Take 10 mg by mouth at bedtime.     DULERA 200-5 MCG/ACT AERO Use 2 puffs two times daily Qty: 26 g, Refills: 1    ELIQUIS 5 MG TABS tablet Take 1 tablet (5 mg total) by mouth 2 (two) times daily. Qty: 120 tablet, Refills: 2    fluticasone (FLONASE) 50 MCG/ACT nasal spray Use 2 sprays in each  nostril daily Qty: 32 g, Refills: 1    glipiZIDE (GLUCOTROL) 5 MG tablet Take 1 tablet (5 mg total) by mouth daily before breakfast. Reported on 12/29/2015 Qty: 180 tablet, Refills: 3    guaifenesin (ROBITUSSIN) 100 MG/5ML syrup  Take 200 mg by mouth every 4 (four) hours as needed for cough.    montelukast (SINGULAIR) 10 MG tablet Take 1 tablet by mouth at  bedtime Qty: 90 tablet, Refills: 1    omeprazole (PRILOSEC) 20 MG capsule Take 1 capsule by mouth  daily Qty: 90 capsule, Refills: 1    polyethylene glycol (MIRALAX / GLYCOLAX) packet Take 17 g by mouth daily as needed for mild constipation.    potassium chloride (K-DUR,KLOR-CON) 10 MEQ tablet Take 40 mEq by mouth daily.     senna-docusate (SENOKOT-S) 8.6-50 MG tablet Take 1 tablet by mouth 2 (two) times daily.    tiotropium (SPIRIVA) 18 MCG inhalation capsule Place 1 capsule (18 mcg total) into inhaler and inhale daily. Qty: 30 capsule, Refills: 12    traZODone (DESYREL) 50 MG tablet 1/2 tab po q hs Qty: 15 tablet, Refills: 3      STOP taking these medications     aspirin EC 81 MG EC tablet      carvedilol (COREG) 3.125 MG tablet       losartan (COZAAR) 25 MG tablet        Allergies  Allergen Reactions  . Percocet [Oxycodone-Acetaminophen] Diarrhea, Nausea And Vomiting and Nausea Only  . Sulfa Antibiotics Diarrhea and Nausea And Vomiting  . Lexapro [Escitalopram Oxalate]     The results of significant diagnostics from this hospitalization (including imaging, microbiology, ancillary and laboratory) are listed below for reference.    Significant Diagnostic Studies: Dg Chest Port 1 View  03/28/2016  CLINICAL DATA:  Post cardioversion EXAM: PORTABLE CHEST 1 VIEW COMPARISON:  03/28/2016 FINDINGS: Cardiac enlargement. Infiltrates or atelectasis again noted in the lung bases without change. Underlying interstitial process. No pneumothorax. Mediastinal contours appear intact. IMPRESSION: Cardiac enlargement with infiltration or edema in the lung bases. Similar appearance to previous study. Electronically Signed   By: Lucienne Capers M.D.   On: 03/28/2016 05:25   Dg Chest Portable 1 View  03/28/2016  CLINICAL DATA:  Palpitations and shortness of breath tonight. EXAM: PORTABLE CHEST 1 VIEW COMPARISON:  CT chest 02/27/2016.  Chest 02/26/2016. FINDINGS: Cardiac enlargement. Bilateral peripheral interstitial infiltrates with increased opacities in the lung bases, likely corresponding to small pleural effusions and basilar atelectasis. No pneumothorax. Mediastinal contours appear intact. IMPRESSION: Cardiac enlargement. Chronic interstitial process in the lungs with increasing opacities in the lung bases probably represent small effusions is seen on previous CT. Electronically Signed   By: Lucienne Capers M.D.   On: 03/28/2016 03:25   Microbiology: No results found for this or any previous visit (from the past 240 hour(s)).   Labs: Basic Metabolic Panel:  Recent Labs Lab 03/28/16 0303 03/29/16 0204 03/30/16 0436 03/31/16 0521 04/01/16 0440  NA 134* 136 137 139 138  K 5.2* 4.5 4.1 4.4 3.9  CL 95* 96* 95* 96* 94*  CO2 29  29 31  32 30  GLUCOSE 139* 147* 138* 132* 139*  BUN 24* 28* 25* 23* 23*  CREATININE 1.51* 1.92* 1.60* 1.39* 1.41*  CALCIUM 8.3* 8.3* 8.3* 8.5* 8.5*  MG 2.1 1.7  --   --   --    CBC:  Recent Labs Lab 03/28/16 0303 03/29/16 0204 03/30/16 0436 03/31/16 0521 04/01/16 0440  WBC 9.3 9.3 6.8 7.5 8.3  HGB 10.4* 10.1* 10.2* 10.3* 10.5*  HCT 31.3* 31.8* 32.5* 32.3* 32.9*  MCV 88.4 89.8 90.3 90.2 90.6  PLT 416* 319 310 344 365   BNP (last 3 results)  Recent Labs  12/15/15 2100  02/02/16 1447 02/26/16 1654  BNP 481.6* 604.4* 999.3*    ProBNP (last 3 results)  Recent Labs  10/06/15 1153 10/21/15 1106 01/05/16 1148  PROBNP 474.0* 318.0* 538.0*   CBG:  Recent Labs Lab 04/01/16 1127 04/01/16 1624 04/01/16 2038 04/02/16 0730 04/02/16 1130  GLUCAP 191* 112* 195* 118* 163*   Signed:  Barton Dubois MD.  [facility] 04/02/2016, 1:02 PM

## 2016-04-02 NOTE — Progress Notes (Signed)
Subjective:  Sitting on bedside commode without shortness of breath.  Atrial fibrillation rate was controlled earlier but is now that he is exerting himself.  Blood pressure reasonable.  Objective:  Vital Signs in the last 24 hours: BP 96/58 mmHg  Pulse 96  Temp(Src) 97.9 F (36.6 C) (Oral)  Resp 19  Ht 6' (1.829 m)  Wt 111.041 kg (244 lb 12.8 oz)  BMI 33.19 kg/m2  SpO2 96%  Physical Exam: Mildly obese white male in no acute distress Lungs:  Clear  Cardiac:  Somewhat rapid irregular rhythm, normal S1 and S2, no S3 Extremities:  Trace edema present  Intake/Output from previous day: 07/14 0701 - 07/15 0700 In: 1220 [P.O.:1220] Out: 2000 [Urine:2000] Weight Filed Weights   03/31/16 0633 04/01/16 0633 04/02/16 0434  Weight: 110.995 kg (244 lb 11.2 oz) 111.313 kg (245 lb 6.4 oz) 111.041 kg (244 lb 12.8 oz)    Lab Results: Basic Metabolic Panel:  Recent Labs  03/31/16 0521 04/01/16 0440  NA 139 138  K 4.4 3.9  CL 96* 94*  CO2 32 30  GLUCOSE 132* 139*  BUN 23* 23*  CREATININE 1.39* 1.41*    CBC:  Recent Labs  03/31/16 0521 04/01/16 0440  WBC 7.5 8.3  HGB 10.3* 10.5*  HCT 32.3* 32.9*  MCV 90.2 90.6  PLT 344 365    BNP    Component Value Date/Time   BNP 999.3* 02/26/2016 1654   BNP 291.1* 09/17/2015 1134    Cardiac Panel (last 3 results)   PROTIME: Lab Results  Component Value Date   INR 1.26 09/29/2015   INR 1.64* 08/18/2015    Telemetry: Review of telemetry shows that his atrial fibrillation rate was mostly controlled since yesterday.  Currently is elevated but he is exerting himself and sitting on the bedside commode.  Assessment/Plan:  1.  Recurrent paroxysmal atrial fibrillation despite cardioversion 2.  Nonischemic cardiomyopathy 3.  Chronic kidney disease stable 4.  Pulmonary fibrosis  Recommendations:  For the most part atrial fibrillation rate has been well-controlled.  Blood pressure is a little bit soft but okay.  From a  cardiac viewpoint with atrial fibrillation and is mostly controlled and he is not having significant dyspnea.  Recommended to go to nursing home.      Kerry Hough  MD [facility] Cardiology  04/02/2016, 8:53 AM

## 2016-04-02 NOTE — Clinical Social Work Placement (Signed)
   CLINICAL SOCIAL WORK PLACEMENT  NOTE  Date:  04/02/2016  Patient Details  Name: Christopher Holder MRN: [id number] Date of Birth: May 18, 1930  Clinical Social Work is seeking post-discharge placement for this patient at the [place] level of care (*CSW will initial, date and re-position this form in  chart as items are completed):  Yes   Patient/family provided with [place] Work Department's list of facilities offering this level of care within the geographic area requested by the patient (or if unable, by the patient's family).  Yes   Patient/family informed of their freedom to choose among providers that offer the needed level of care, that participate in Medicare, Medicaid or managed care program needed by the patient, have an available bed and are willing to accept the patient.  Yes   Patient/family informed of [facility]'s ownership interest in [facility] and [facility], as well as of the fact that they are under no obligation to receive care at these facilities.  PASRR submitted to EDS on       PASRR number received on       Existing PASRR number confirmed on 03/30/16     FL2 transmitted to all facilities in geographic area requested by pt/family on 03/29/16     FL2 transmitted to all facilities within larger geographic area on       Patient informed that his/her managed care company has contracts with or will negotiate with certain facilities, including the following:        Yes   Patient/family informed of bed offers received.  Patient chooses bed at [facility]     Physician recommends and patient chooses bed at      Patient to be transferred to [facility] on 04/02/16.  Patient to be transferred to facility by Ambulance     Patient family notified on 04/02/16 of transfer.  Name of family member notified:  Ramiz Spannagel (Granddaughter)     PHYSICIAN Please prepare priority discharge summary,  including medications     Additional Comment:    Barbette Or, [place]

## 2016-04-02 NOTE — Clinical Social Work Note (Signed)
Clinical Social Worker facilitated patient discharge including contacting patient family and facility to confirm patient discharge plans.  Clinical information faxed to facility and family agreeable with plan.  CSW arranged ambulance transport via [facility] to [facility] .  RN to call report prior to discharge.  Clinical Social Worker will sign off for now as social work intervention is no longer needed. Please consult us again if new need arises.  Jesse Dajai Wahlert, LCSW [phone] 

## 2016-04-03 DIAGNOSIS — J9691 Respiratory failure, unspecified with hypoxia: Secondary | ICD-10-CM | POA: Diagnosis not present

## 2016-04-03 DIAGNOSIS — I509 Heart failure, unspecified: Secondary | ICD-10-CM | POA: Diagnosis not present

## 2016-04-03 DIAGNOSIS — J449 Chronic obstructive pulmonary disease, unspecified: Secondary | ICD-10-CM | POA: Diagnosis not present

## 2016-04-03 DIAGNOSIS — J8410 Pulmonary fibrosis, unspecified: Secondary | ICD-10-CM | POA: Diagnosis not present

## 2016-04-19 DIAGNOSIS — I519 Heart disease, unspecified: Secondary | ICD-10-CM | POA: Diagnosis not present

## 2016-04-20 DIAGNOSIS — I519 Heart disease, unspecified: Secondary | ICD-10-CM | POA: Diagnosis not present

## 2016-04-21 DIAGNOSIS — I519 Heart disease, unspecified: Secondary | ICD-10-CM | POA: Diagnosis not present

## 2016-04-22 DIAGNOSIS — I519 Heart disease, unspecified: Secondary | ICD-10-CM | POA: Diagnosis not present

## 2016-04-23 DIAGNOSIS — I519 Heart disease, unspecified: Secondary | ICD-10-CM | POA: Diagnosis not present

## 2016-04-24 DIAGNOSIS — I519 Heart disease, unspecified: Secondary | ICD-10-CM | POA: Diagnosis not present

## 2016-04-25 DIAGNOSIS — I519 Heart disease, unspecified: Secondary | ICD-10-CM | POA: Diagnosis not present

## 2016-04-26 DIAGNOSIS — I519 Heart disease, unspecified: Secondary | ICD-10-CM | POA: Diagnosis not present

## 2016-04-27 ENCOUNTER — Ambulatory Visit: Payer: Medicare Other | Admitting: Podiatry

## 2016-04-27 DIAGNOSIS — I519 Heart disease, unspecified: Secondary | ICD-10-CM | POA: Diagnosis not present

## 2016-04-28 DIAGNOSIS — I519 Heart disease, unspecified: Secondary | ICD-10-CM | POA: Diagnosis not present

## 2016-04-29 DIAGNOSIS — I519 Heart disease, unspecified: Secondary | ICD-10-CM | POA: Diagnosis not present

## 2016-04-30 DIAGNOSIS — I519 Heart disease, unspecified: Secondary | ICD-10-CM | POA: Diagnosis not present

## 2016-05-01 DIAGNOSIS — I519 Heart disease, unspecified: Secondary | ICD-10-CM | POA: Diagnosis not present

## 2016-05-02 DIAGNOSIS — I519 Heart disease, unspecified: Secondary | ICD-10-CM | POA: Diagnosis not present

## 2016-05-03 DIAGNOSIS — I519 Heart disease, unspecified: Secondary | ICD-10-CM | POA: Diagnosis not present

## 2016-05-04 DIAGNOSIS — I519 Heart disease, unspecified: Secondary | ICD-10-CM | POA: Diagnosis not present

## 2016-05-05 DIAGNOSIS — I519 Heart disease, unspecified: Secondary | ICD-10-CM | POA: Diagnosis not present

## 2016-05-06 DIAGNOSIS — I519 Heart disease, unspecified: Secondary | ICD-10-CM | POA: Diagnosis not present

## 2016-05-07 DIAGNOSIS — I519 Heart disease, unspecified: Secondary | ICD-10-CM | POA: Diagnosis not present

## 2016-05-08 DIAGNOSIS — I519 Heart disease, unspecified: Secondary | ICD-10-CM | POA: Diagnosis not present

## 2016-05-09 DIAGNOSIS — I519 Heart disease, unspecified: Secondary | ICD-10-CM | POA: Diagnosis not present

## 2016-05-10 DIAGNOSIS — I519 Heart disease, unspecified: Secondary | ICD-10-CM | POA: Diagnosis not present

## 2016-05-11 DIAGNOSIS — I519 Heart disease, unspecified: Secondary | ICD-10-CM | POA: Diagnosis not present

## 2016-05-12 DIAGNOSIS — I519 Heart disease, unspecified: Secondary | ICD-10-CM | POA: Diagnosis not present

## 2016-05-13 DIAGNOSIS — I519 Heart disease, unspecified: Secondary | ICD-10-CM | POA: Diagnosis not present

## 2016-05-14 DIAGNOSIS — I519 Heart disease, unspecified: Secondary | ICD-10-CM | POA: Diagnosis not present

## 2016-05-15 DIAGNOSIS — I519 Heart disease, unspecified: Secondary | ICD-10-CM | POA: Diagnosis not present

## 2016-05-16 DIAGNOSIS — I519 Heart disease, unspecified: Secondary | ICD-10-CM | POA: Diagnosis not present

## 2016-05-17 DIAGNOSIS — I519 Heart disease, unspecified: Secondary | ICD-10-CM | POA: Diagnosis not present

## 2016-05-18 DIAGNOSIS — I519 Heart disease, unspecified: Secondary | ICD-10-CM | POA: Diagnosis not present

## 2016-05-19 DIAGNOSIS — I519 Heart disease, unspecified: Secondary | ICD-10-CM | POA: Diagnosis not present

## 2016-05-20 DIAGNOSIS — M1990 Unspecified osteoarthritis, unspecified site: Secondary | ICD-10-CM | POA: Diagnosis not present

## 2016-05-20 DIAGNOSIS — E039 Hypothyroidism, unspecified: Secondary | ICD-10-CM | POA: Diagnosis not present

## 2016-05-20 DIAGNOSIS — J84112 Idiopathic pulmonary fibrosis: Secondary | ICD-10-CM | POA: Diagnosis not present

## 2016-05-20 DIAGNOSIS — K219 Gastro-esophageal reflux disease without esophagitis: Secondary | ICD-10-CM | POA: Diagnosis not present

## 2016-05-20 DIAGNOSIS — N183 Chronic kidney disease, stage 3 (moderate): Secondary | ICD-10-CM | POA: Diagnosis not present

## 2016-05-20 DIAGNOSIS — I25119 Atherosclerotic heart disease of native coronary artery with unspecified angina pectoris: Secondary | ICD-10-CM | POA: Diagnosis not present

## 2016-05-20 DIAGNOSIS — I4891 Unspecified atrial fibrillation: Secondary | ICD-10-CM | POA: Diagnosis not present

## 2016-05-20 DIAGNOSIS — I10 Essential (primary) hypertension: Secondary | ICD-10-CM | POA: Diagnosis not present

## 2016-05-20 DIAGNOSIS — I509 Heart failure, unspecified: Secondary | ICD-10-CM | POA: Diagnosis not present

## 2016-05-20 DIAGNOSIS — J449 Chronic obstructive pulmonary disease, unspecified: Secondary | ICD-10-CM | POA: Diagnosis not present

## 2016-05-20 DIAGNOSIS — E1159 Type 2 diabetes mellitus with other circulatory complications: Secondary | ICD-10-CM | POA: Diagnosis not present

## 2016-05-20 DIAGNOSIS — J309 Allergic rhinitis, unspecified: Secondary | ICD-10-CM | POA: Diagnosis not present

## 2016-05-20 DIAGNOSIS — I519 Heart disease, unspecified: Secondary | ICD-10-CM | POA: Diagnosis not present

## 2016-05-21 DIAGNOSIS — J309 Allergic rhinitis, unspecified: Secondary | ICD-10-CM | POA: Diagnosis not present

## 2016-05-21 DIAGNOSIS — I10 Essential (primary) hypertension: Secondary | ICD-10-CM | POA: Diagnosis not present

## 2016-05-21 DIAGNOSIS — J449 Chronic obstructive pulmonary disease, unspecified: Secondary | ICD-10-CM | POA: Diagnosis not present

## 2016-05-21 DIAGNOSIS — E1159 Type 2 diabetes mellitus with other circulatory complications: Secondary | ICD-10-CM | POA: Diagnosis not present

## 2016-05-21 DIAGNOSIS — I509 Heart failure, unspecified: Secondary | ICD-10-CM | POA: Diagnosis not present

## 2016-05-21 DIAGNOSIS — J84112 Idiopathic pulmonary fibrosis: Secondary | ICD-10-CM | POA: Diagnosis not present

## 2016-05-21 DIAGNOSIS — N183 Chronic kidney disease, stage 3 (moderate): Secondary | ICD-10-CM | POA: Diagnosis not present

## 2016-05-21 DIAGNOSIS — I519 Heart disease, unspecified: Secondary | ICD-10-CM | POA: Diagnosis not present

## 2016-05-21 DIAGNOSIS — E039 Hypothyroidism, unspecified: Secondary | ICD-10-CM | POA: Diagnosis not present

## 2016-05-21 DIAGNOSIS — K219 Gastro-esophageal reflux disease without esophagitis: Secondary | ICD-10-CM | POA: Diagnosis not present

## 2016-05-21 DIAGNOSIS — I4891 Unspecified atrial fibrillation: Secondary | ICD-10-CM | POA: Diagnosis not present

## 2016-05-21 DIAGNOSIS — M1990 Unspecified osteoarthritis, unspecified site: Secondary | ICD-10-CM | POA: Diagnosis not present

## 2016-05-21 DIAGNOSIS — I25119 Atherosclerotic heart disease of native coronary artery with unspecified angina pectoris: Secondary | ICD-10-CM | POA: Diagnosis not present

## 2016-05-22 DIAGNOSIS — J309 Allergic rhinitis, unspecified: Secondary | ICD-10-CM | POA: Diagnosis not present

## 2016-05-22 DIAGNOSIS — I519 Heart disease, unspecified: Secondary | ICD-10-CM | POA: Diagnosis not present

## 2016-05-22 DIAGNOSIS — I509 Heart failure, unspecified: Secondary | ICD-10-CM | POA: Diagnosis not present

## 2016-05-22 DIAGNOSIS — E1159 Type 2 diabetes mellitus with other circulatory complications: Secondary | ICD-10-CM | POA: Diagnosis not present

## 2016-05-22 DIAGNOSIS — I4891 Unspecified atrial fibrillation: Secondary | ICD-10-CM | POA: Diagnosis not present

## 2016-05-22 DIAGNOSIS — K219 Gastro-esophageal reflux disease without esophagitis: Secondary | ICD-10-CM | POA: Diagnosis not present

## 2016-05-22 DIAGNOSIS — E039 Hypothyroidism, unspecified: Secondary | ICD-10-CM | POA: Diagnosis not present

## 2016-05-22 DIAGNOSIS — J449 Chronic obstructive pulmonary disease, unspecified: Secondary | ICD-10-CM | POA: Diagnosis not present

## 2016-05-22 DIAGNOSIS — J84112 Idiopathic pulmonary fibrosis: Secondary | ICD-10-CM | POA: Diagnosis not present

## 2016-05-22 DIAGNOSIS — I10 Essential (primary) hypertension: Secondary | ICD-10-CM | POA: Diagnosis not present

## 2016-05-22 DIAGNOSIS — I25119 Atherosclerotic heart disease of native coronary artery with unspecified angina pectoris: Secondary | ICD-10-CM | POA: Diagnosis not present

## 2016-05-22 DIAGNOSIS — N183 Chronic kidney disease, stage 3 (moderate): Secondary | ICD-10-CM | POA: Diagnosis not present

## 2016-05-22 DIAGNOSIS — M1990 Unspecified osteoarthritis, unspecified site: Secondary | ICD-10-CM | POA: Diagnosis not present

## 2016-05-23 DIAGNOSIS — J309 Allergic rhinitis, unspecified: Secondary | ICD-10-CM | POA: Diagnosis not present

## 2016-05-23 DIAGNOSIS — I509 Heart failure, unspecified: Secondary | ICD-10-CM | POA: Diagnosis not present

## 2016-05-23 DIAGNOSIS — I519 Heart disease, unspecified: Secondary | ICD-10-CM | POA: Diagnosis not present

## 2016-05-23 DIAGNOSIS — N183 Chronic kidney disease, stage 3 (moderate): Secondary | ICD-10-CM | POA: Diagnosis not present

## 2016-05-23 DIAGNOSIS — K219 Gastro-esophageal reflux disease without esophagitis: Secondary | ICD-10-CM | POA: Diagnosis not present

## 2016-05-23 DIAGNOSIS — M1990 Unspecified osteoarthritis, unspecified site: Secondary | ICD-10-CM | POA: Diagnosis not present

## 2016-05-23 DIAGNOSIS — I25119 Atherosclerotic heart disease of native coronary artery with unspecified angina pectoris: Secondary | ICD-10-CM | POA: Diagnosis not present

## 2016-05-23 DIAGNOSIS — J449 Chronic obstructive pulmonary disease, unspecified: Secondary | ICD-10-CM | POA: Diagnosis not present

## 2016-05-23 DIAGNOSIS — I4891 Unspecified atrial fibrillation: Secondary | ICD-10-CM | POA: Diagnosis not present

## 2016-05-23 DIAGNOSIS — J84112 Idiopathic pulmonary fibrosis: Secondary | ICD-10-CM | POA: Diagnosis not present

## 2016-05-23 DIAGNOSIS — E1159 Type 2 diabetes mellitus with other circulatory complications: Secondary | ICD-10-CM | POA: Diagnosis not present

## 2016-05-23 DIAGNOSIS — E039 Hypothyroidism, unspecified: Secondary | ICD-10-CM | POA: Diagnosis not present

## 2016-05-23 DIAGNOSIS — I10 Essential (primary) hypertension: Secondary | ICD-10-CM | POA: Diagnosis not present

## 2016-05-24 DIAGNOSIS — I4891 Unspecified atrial fibrillation: Secondary | ICD-10-CM | POA: Diagnosis not present

## 2016-05-24 DIAGNOSIS — I509 Heart failure, unspecified: Secondary | ICD-10-CM | POA: Diagnosis not present

## 2016-05-24 DIAGNOSIS — I10 Essential (primary) hypertension: Secondary | ICD-10-CM | POA: Diagnosis not present

## 2016-05-24 DIAGNOSIS — J309 Allergic rhinitis, unspecified: Secondary | ICD-10-CM | POA: Diagnosis not present

## 2016-05-24 DIAGNOSIS — I25119 Atherosclerotic heart disease of native coronary artery with unspecified angina pectoris: Secondary | ICD-10-CM | POA: Diagnosis not present

## 2016-05-24 DIAGNOSIS — E039 Hypothyroidism, unspecified: Secondary | ICD-10-CM | POA: Diagnosis not present

## 2016-05-24 DIAGNOSIS — E1159 Type 2 diabetes mellitus with other circulatory complications: Secondary | ICD-10-CM | POA: Diagnosis not present

## 2016-05-24 DIAGNOSIS — N183 Chronic kidney disease, stage 3 (moderate): Secondary | ICD-10-CM | POA: Diagnosis not present

## 2016-05-24 DIAGNOSIS — J449 Chronic obstructive pulmonary disease, unspecified: Secondary | ICD-10-CM | POA: Diagnosis not present

## 2016-05-24 DIAGNOSIS — J84112 Idiopathic pulmonary fibrosis: Secondary | ICD-10-CM | POA: Diagnosis not present

## 2016-05-24 DIAGNOSIS — K219 Gastro-esophageal reflux disease without esophagitis: Secondary | ICD-10-CM | POA: Diagnosis not present

## 2016-05-24 DIAGNOSIS — M1990 Unspecified osteoarthritis, unspecified site: Secondary | ICD-10-CM | POA: Diagnosis not present

## 2016-05-24 DIAGNOSIS — I519 Heart disease, unspecified: Secondary | ICD-10-CM | POA: Diagnosis not present

## 2016-05-25 DIAGNOSIS — I519 Heart disease, unspecified: Secondary | ICD-10-CM | POA: Diagnosis not present

## 2016-05-25 DIAGNOSIS — M1990 Unspecified osteoarthritis, unspecified site: Secondary | ICD-10-CM | POA: Diagnosis not present

## 2016-05-25 DIAGNOSIS — I25119 Atherosclerotic heart disease of native coronary artery with unspecified angina pectoris: Secondary | ICD-10-CM | POA: Diagnosis not present

## 2016-05-25 DIAGNOSIS — I10 Essential (primary) hypertension: Secondary | ICD-10-CM | POA: Diagnosis not present

## 2016-05-25 DIAGNOSIS — J449 Chronic obstructive pulmonary disease, unspecified: Secondary | ICD-10-CM | POA: Diagnosis not present

## 2016-05-25 DIAGNOSIS — K219 Gastro-esophageal reflux disease without esophagitis: Secondary | ICD-10-CM | POA: Diagnosis not present

## 2016-05-25 DIAGNOSIS — E1159 Type 2 diabetes mellitus with other circulatory complications: Secondary | ICD-10-CM | POA: Diagnosis not present

## 2016-05-25 DIAGNOSIS — I4891 Unspecified atrial fibrillation: Secondary | ICD-10-CM | POA: Diagnosis not present

## 2016-05-25 DIAGNOSIS — J84112 Idiopathic pulmonary fibrosis: Secondary | ICD-10-CM | POA: Diagnosis not present

## 2016-05-25 DIAGNOSIS — J309 Allergic rhinitis, unspecified: Secondary | ICD-10-CM | POA: Diagnosis not present

## 2016-05-25 DIAGNOSIS — N183 Chronic kidney disease, stage 3 (moderate): Secondary | ICD-10-CM | POA: Diagnosis not present

## 2016-05-25 DIAGNOSIS — I509 Heart failure, unspecified: Secondary | ICD-10-CM | POA: Diagnosis not present

## 2016-05-25 DIAGNOSIS — E039 Hypothyroidism, unspecified: Secondary | ICD-10-CM | POA: Diagnosis not present

## 2016-05-26 DIAGNOSIS — I509 Heart failure, unspecified: Secondary | ICD-10-CM | POA: Diagnosis not present

## 2016-05-26 DIAGNOSIS — M1990 Unspecified osteoarthritis, unspecified site: Secondary | ICD-10-CM | POA: Diagnosis not present

## 2016-05-26 DIAGNOSIS — I25119 Atherosclerotic heart disease of native coronary artery with unspecified angina pectoris: Secondary | ICD-10-CM | POA: Diagnosis not present

## 2016-05-26 DIAGNOSIS — E039 Hypothyroidism, unspecified: Secondary | ICD-10-CM | POA: Diagnosis not present

## 2016-05-26 DIAGNOSIS — I10 Essential (primary) hypertension: Secondary | ICD-10-CM | POA: Diagnosis not present

## 2016-05-26 DIAGNOSIS — N183 Chronic kidney disease, stage 3 (moderate): Secondary | ICD-10-CM | POA: Diagnosis not present

## 2016-05-26 DIAGNOSIS — E1159 Type 2 diabetes mellitus with other circulatory complications: Secondary | ICD-10-CM | POA: Diagnosis not present

## 2016-05-26 DIAGNOSIS — I4891 Unspecified atrial fibrillation: Secondary | ICD-10-CM | POA: Diagnosis not present

## 2016-05-26 DIAGNOSIS — I519 Heart disease, unspecified: Secondary | ICD-10-CM | POA: Diagnosis not present

## 2016-05-26 DIAGNOSIS — J309 Allergic rhinitis, unspecified: Secondary | ICD-10-CM | POA: Diagnosis not present

## 2016-05-26 DIAGNOSIS — J449 Chronic obstructive pulmonary disease, unspecified: Secondary | ICD-10-CM | POA: Diagnosis not present

## 2016-05-26 DIAGNOSIS — J84112 Idiopathic pulmonary fibrosis: Secondary | ICD-10-CM | POA: Diagnosis not present

## 2016-05-26 DIAGNOSIS — K219 Gastro-esophageal reflux disease without esophagitis: Secondary | ICD-10-CM | POA: Diagnosis not present

## 2016-05-27 DIAGNOSIS — E039 Hypothyroidism, unspecified: Secondary | ICD-10-CM | POA: Diagnosis not present

## 2016-05-27 DIAGNOSIS — I519 Heart disease, unspecified: Secondary | ICD-10-CM | POA: Diagnosis not present

## 2016-05-27 DIAGNOSIS — I25119 Atherosclerotic heart disease of native coronary artery with unspecified angina pectoris: Secondary | ICD-10-CM | POA: Diagnosis not present

## 2016-05-27 DIAGNOSIS — G4700 Insomnia, unspecified: Secondary | ICD-10-CM | POA: Diagnosis not present

## 2016-05-27 DIAGNOSIS — E1159 Type 2 diabetes mellitus with other circulatory complications: Secondary | ICD-10-CM | POA: Diagnosis not present

## 2016-05-27 DIAGNOSIS — I10 Essential (primary) hypertension: Secondary | ICD-10-CM | POA: Diagnosis not present

## 2016-05-27 DIAGNOSIS — J84112 Idiopathic pulmonary fibrosis: Secondary | ICD-10-CM | POA: Diagnosis not present

## 2016-05-27 DIAGNOSIS — J309 Allergic rhinitis, unspecified: Secondary | ICD-10-CM | POA: Diagnosis not present

## 2016-05-27 DIAGNOSIS — N183 Chronic kidney disease, stage 3 (moderate): Secondary | ICD-10-CM | POA: Diagnosis not present

## 2016-05-27 DIAGNOSIS — M1990 Unspecified osteoarthritis, unspecified site: Secondary | ICD-10-CM | POA: Diagnosis not present

## 2016-05-27 DIAGNOSIS — I4891 Unspecified atrial fibrillation: Secondary | ICD-10-CM | POA: Diagnosis not present

## 2016-05-27 DIAGNOSIS — J449 Chronic obstructive pulmonary disease, unspecified: Secondary | ICD-10-CM | POA: Diagnosis not present

## 2016-05-27 DIAGNOSIS — J9611 Chronic respiratory failure with hypoxia: Secondary | ICD-10-CM | POA: Diagnosis not present

## 2016-05-27 DIAGNOSIS — I509 Heart failure, unspecified: Secondary | ICD-10-CM | POA: Diagnosis not present

## 2016-05-27 DIAGNOSIS — E46 Unspecified protein-calorie malnutrition: Secondary | ICD-10-CM | POA: Diagnosis not present

## 2016-05-27 DIAGNOSIS — K219 Gastro-esophageal reflux disease without esophagitis: Secondary | ICD-10-CM | POA: Diagnosis not present

## 2016-05-28 DIAGNOSIS — J84112 Idiopathic pulmonary fibrosis: Secondary | ICD-10-CM | POA: Diagnosis not present

## 2016-05-28 DIAGNOSIS — I4891 Unspecified atrial fibrillation: Secondary | ICD-10-CM | POA: Diagnosis not present

## 2016-05-28 DIAGNOSIS — I519 Heart disease, unspecified: Secondary | ICD-10-CM | POA: Diagnosis not present

## 2016-05-28 DIAGNOSIS — E1159 Type 2 diabetes mellitus with other circulatory complications: Secondary | ICD-10-CM | POA: Diagnosis not present

## 2016-05-28 DIAGNOSIS — J449 Chronic obstructive pulmonary disease, unspecified: Secondary | ICD-10-CM | POA: Diagnosis not present

## 2016-05-28 DIAGNOSIS — K219 Gastro-esophageal reflux disease without esophagitis: Secondary | ICD-10-CM | POA: Diagnosis not present

## 2016-05-28 DIAGNOSIS — I25119 Atherosclerotic heart disease of native coronary artery with unspecified angina pectoris: Secondary | ICD-10-CM | POA: Diagnosis not present

## 2016-05-28 DIAGNOSIS — M1990 Unspecified osteoarthritis, unspecified site: Secondary | ICD-10-CM | POA: Diagnosis not present

## 2016-05-28 DIAGNOSIS — I509 Heart failure, unspecified: Secondary | ICD-10-CM | POA: Diagnosis not present

## 2016-05-28 DIAGNOSIS — I10 Essential (primary) hypertension: Secondary | ICD-10-CM | POA: Diagnosis not present

## 2016-05-28 DIAGNOSIS — N183 Chronic kidney disease, stage 3 (moderate): Secondary | ICD-10-CM | POA: Diagnosis not present

## 2016-05-28 DIAGNOSIS — E039 Hypothyroidism, unspecified: Secondary | ICD-10-CM | POA: Diagnosis not present

## 2016-05-28 DIAGNOSIS — J309 Allergic rhinitis, unspecified: Secondary | ICD-10-CM | POA: Diagnosis not present

## 2016-05-29 DIAGNOSIS — I25119 Atherosclerotic heart disease of native coronary artery with unspecified angina pectoris: Secondary | ICD-10-CM | POA: Diagnosis not present

## 2016-05-29 DIAGNOSIS — J309 Allergic rhinitis, unspecified: Secondary | ICD-10-CM | POA: Diagnosis not present

## 2016-05-29 DIAGNOSIS — J84112 Idiopathic pulmonary fibrosis: Secondary | ICD-10-CM | POA: Diagnosis not present

## 2016-05-29 DIAGNOSIS — I10 Essential (primary) hypertension: Secondary | ICD-10-CM | POA: Diagnosis not present

## 2016-05-29 DIAGNOSIS — I519 Heart disease, unspecified: Secondary | ICD-10-CM | POA: Diagnosis not present

## 2016-05-29 DIAGNOSIS — J449 Chronic obstructive pulmonary disease, unspecified: Secondary | ICD-10-CM | POA: Diagnosis not present

## 2016-05-29 DIAGNOSIS — E1159 Type 2 diabetes mellitus with other circulatory complications: Secondary | ICD-10-CM | POA: Diagnosis not present

## 2016-05-29 DIAGNOSIS — I509 Heart failure, unspecified: Secondary | ICD-10-CM | POA: Diagnosis not present

## 2016-05-29 DIAGNOSIS — E039 Hypothyroidism, unspecified: Secondary | ICD-10-CM | POA: Diagnosis not present

## 2016-05-29 DIAGNOSIS — K219 Gastro-esophageal reflux disease without esophagitis: Secondary | ICD-10-CM | POA: Diagnosis not present

## 2016-05-29 DIAGNOSIS — I4891 Unspecified atrial fibrillation: Secondary | ICD-10-CM | POA: Diagnosis not present

## 2016-05-29 DIAGNOSIS — N183 Chronic kidney disease, stage 3 (moderate): Secondary | ICD-10-CM | POA: Diagnosis not present

## 2016-05-29 DIAGNOSIS — M1990 Unspecified osteoarthritis, unspecified site: Secondary | ICD-10-CM | POA: Diagnosis not present

## 2016-05-30 DIAGNOSIS — J309 Allergic rhinitis, unspecified: Secondary | ICD-10-CM | POA: Diagnosis not present

## 2016-05-30 DIAGNOSIS — E039 Hypothyroidism, unspecified: Secondary | ICD-10-CM | POA: Diagnosis not present

## 2016-05-30 DIAGNOSIS — M1990 Unspecified osteoarthritis, unspecified site: Secondary | ICD-10-CM | POA: Diagnosis not present

## 2016-05-30 DIAGNOSIS — N183 Chronic kidney disease, stage 3 (moderate): Secondary | ICD-10-CM | POA: Diagnosis not present

## 2016-05-30 DIAGNOSIS — K219 Gastro-esophageal reflux disease without esophagitis: Secondary | ICD-10-CM | POA: Diagnosis not present

## 2016-05-30 DIAGNOSIS — I10 Essential (primary) hypertension: Secondary | ICD-10-CM | POA: Diagnosis not present

## 2016-05-30 DIAGNOSIS — I519 Heart disease, unspecified: Secondary | ICD-10-CM | POA: Diagnosis not present

## 2016-05-30 DIAGNOSIS — E1159 Type 2 diabetes mellitus with other circulatory complications: Secondary | ICD-10-CM | POA: Diagnosis not present

## 2016-05-30 DIAGNOSIS — J449 Chronic obstructive pulmonary disease, unspecified: Secondary | ICD-10-CM | POA: Diagnosis not present

## 2016-05-30 DIAGNOSIS — I25119 Atherosclerotic heart disease of native coronary artery with unspecified angina pectoris: Secondary | ICD-10-CM | POA: Diagnosis not present

## 2016-05-30 DIAGNOSIS — I4891 Unspecified atrial fibrillation: Secondary | ICD-10-CM | POA: Diagnosis not present

## 2016-05-30 DIAGNOSIS — J84112 Idiopathic pulmonary fibrosis: Secondary | ICD-10-CM | POA: Diagnosis not present

## 2016-05-30 DIAGNOSIS — I509 Heart failure, unspecified: Secondary | ICD-10-CM | POA: Diagnosis not present

## 2016-05-31 DIAGNOSIS — J84112 Idiopathic pulmonary fibrosis: Secondary | ICD-10-CM | POA: Diagnosis not present

## 2016-05-31 DIAGNOSIS — K219 Gastro-esophageal reflux disease without esophagitis: Secondary | ICD-10-CM | POA: Diagnosis not present

## 2016-05-31 DIAGNOSIS — E1159 Type 2 diabetes mellitus with other circulatory complications: Secondary | ICD-10-CM | POA: Diagnosis not present

## 2016-05-31 DIAGNOSIS — J449 Chronic obstructive pulmonary disease, unspecified: Secondary | ICD-10-CM | POA: Diagnosis not present

## 2016-05-31 DIAGNOSIS — I25119 Atherosclerotic heart disease of native coronary artery with unspecified angina pectoris: Secondary | ICD-10-CM | POA: Diagnosis not present

## 2016-05-31 DIAGNOSIS — I519 Heart disease, unspecified: Secondary | ICD-10-CM | POA: Diagnosis not present

## 2016-05-31 DIAGNOSIS — I509 Heart failure, unspecified: Secondary | ICD-10-CM | POA: Diagnosis not present

## 2016-05-31 DIAGNOSIS — I4891 Unspecified atrial fibrillation: Secondary | ICD-10-CM | POA: Diagnosis not present

## 2016-05-31 DIAGNOSIS — I10 Essential (primary) hypertension: Secondary | ICD-10-CM | POA: Diagnosis not present

## 2016-05-31 DIAGNOSIS — M1990 Unspecified osteoarthritis, unspecified site: Secondary | ICD-10-CM | POA: Diagnosis not present

## 2016-05-31 DIAGNOSIS — J309 Allergic rhinitis, unspecified: Secondary | ICD-10-CM | POA: Diagnosis not present

## 2016-05-31 DIAGNOSIS — N183 Chronic kidney disease, stage 3 (moderate): Secondary | ICD-10-CM | POA: Diagnosis not present

## 2016-05-31 DIAGNOSIS — E039 Hypothyroidism, unspecified: Secondary | ICD-10-CM | POA: Diagnosis not present

## 2016-06-01 ENCOUNTER — Ambulatory Visit: Payer: Medicare Other | Admitting: Pulmonary Disease

## 2016-06-01 DIAGNOSIS — J84112 Idiopathic pulmonary fibrosis: Secondary | ICD-10-CM | POA: Diagnosis not present

## 2016-06-01 DIAGNOSIS — J309 Allergic rhinitis, unspecified: Secondary | ICD-10-CM | POA: Diagnosis not present

## 2016-06-01 DIAGNOSIS — M1990 Unspecified osteoarthritis, unspecified site: Secondary | ICD-10-CM | POA: Diagnosis not present

## 2016-06-01 DIAGNOSIS — I4891 Unspecified atrial fibrillation: Secondary | ICD-10-CM | POA: Diagnosis not present

## 2016-06-01 DIAGNOSIS — I519 Heart disease, unspecified: Secondary | ICD-10-CM | POA: Diagnosis not present

## 2016-06-01 DIAGNOSIS — K219 Gastro-esophageal reflux disease without esophagitis: Secondary | ICD-10-CM | POA: Diagnosis not present

## 2016-06-01 DIAGNOSIS — I509 Heart failure, unspecified: Secondary | ICD-10-CM | POA: Diagnosis not present

## 2016-06-01 DIAGNOSIS — I25119 Atherosclerotic heart disease of native coronary artery with unspecified angina pectoris: Secondary | ICD-10-CM | POA: Diagnosis not present

## 2016-06-01 DIAGNOSIS — E1159 Type 2 diabetes mellitus with other circulatory complications: Secondary | ICD-10-CM | POA: Diagnosis not present

## 2016-06-01 DIAGNOSIS — J449 Chronic obstructive pulmonary disease, unspecified: Secondary | ICD-10-CM | POA: Diagnosis not present

## 2016-06-01 DIAGNOSIS — E039 Hypothyroidism, unspecified: Secondary | ICD-10-CM | POA: Diagnosis not present

## 2016-06-01 DIAGNOSIS — I10 Essential (primary) hypertension: Secondary | ICD-10-CM | POA: Diagnosis not present

## 2016-06-01 DIAGNOSIS — N183 Chronic kidney disease, stage 3 (moderate): Secondary | ICD-10-CM | POA: Diagnosis not present

## 2016-06-02 DIAGNOSIS — J309 Allergic rhinitis, unspecified: Secondary | ICD-10-CM | POA: Diagnosis not present

## 2016-06-02 DIAGNOSIS — E039 Hypothyroidism, unspecified: Secondary | ICD-10-CM | POA: Diagnosis not present

## 2016-06-02 DIAGNOSIS — I509 Heart failure, unspecified: Secondary | ICD-10-CM | POA: Diagnosis not present

## 2016-06-02 DIAGNOSIS — I10 Essential (primary) hypertension: Secondary | ICD-10-CM | POA: Diagnosis not present

## 2016-06-02 DIAGNOSIS — M1990 Unspecified osteoarthritis, unspecified site: Secondary | ICD-10-CM | POA: Diagnosis not present

## 2016-06-02 DIAGNOSIS — J84112 Idiopathic pulmonary fibrosis: Secondary | ICD-10-CM | POA: Diagnosis not present

## 2016-06-02 DIAGNOSIS — J449 Chronic obstructive pulmonary disease, unspecified: Secondary | ICD-10-CM | POA: Diagnosis not present

## 2016-06-02 DIAGNOSIS — E1159 Type 2 diabetes mellitus with other circulatory complications: Secondary | ICD-10-CM | POA: Diagnosis not present

## 2016-06-02 DIAGNOSIS — I25119 Atherosclerotic heart disease of native coronary artery with unspecified angina pectoris: Secondary | ICD-10-CM | POA: Diagnosis not present

## 2016-06-02 DIAGNOSIS — K219 Gastro-esophageal reflux disease without esophagitis: Secondary | ICD-10-CM | POA: Diagnosis not present

## 2016-06-02 DIAGNOSIS — I519 Heart disease, unspecified: Secondary | ICD-10-CM | POA: Diagnosis not present

## 2016-06-02 DIAGNOSIS — N183 Chronic kidney disease, stage 3 (moderate): Secondary | ICD-10-CM | POA: Diagnosis not present

## 2016-06-02 DIAGNOSIS — I4891 Unspecified atrial fibrillation: Secondary | ICD-10-CM | POA: Diagnosis not present

## 2016-06-03 DIAGNOSIS — E0822 Diabetes mellitus due to underlying condition with diabetic chronic kidney disease: Secondary | ICD-10-CM | POA: Diagnosis not present

## 2016-06-03 DIAGNOSIS — J8410 Pulmonary fibrosis, unspecified: Secondary | ICD-10-CM | POA: Diagnosis not present

## 2016-06-03 DIAGNOSIS — J84112 Idiopathic pulmonary fibrosis: Secondary | ICD-10-CM | POA: Diagnosis not present

## 2016-06-03 DIAGNOSIS — E039 Hypothyroidism, unspecified: Secondary | ICD-10-CM | POA: Diagnosis not present

## 2016-06-03 DIAGNOSIS — K219 Gastro-esophageal reflux disease without esophagitis: Secondary | ICD-10-CM | POA: Diagnosis not present

## 2016-06-03 DIAGNOSIS — I25119 Atherosclerotic heart disease of native coronary artery with unspecified angina pectoris: Secondary | ICD-10-CM | POA: Diagnosis not present

## 2016-06-03 DIAGNOSIS — I509 Heart failure, unspecified: Secondary | ICD-10-CM | POA: Diagnosis not present

## 2016-06-03 DIAGNOSIS — E1159 Type 2 diabetes mellitus with other circulatory complications: Secondary | ICD-10-CM | POA: Diagnosis not present

## 2016-06-03 DIAGNOSIS — J449 Chronic obstructive pulmonary disease, unspecified: Secondary | ICD-10-CM | POA: Diagnosis not present

## 2016-06-03 DIAGNOSIS — I2510 Atherosclerotic heart disease of native coronary artery without angina pectoris: Secondary | ICD-10-CM | POA: Diagnosis not present

## 2016-06-03 DIAGNOSIS — J309 Allergic rhinitis, unspecified: Secondary | ICD-10-CM | POA: Diagnosis not present

## 2016-06-03 DIAGNOSIS — I519 Heart disease, unspecified: Secondary | ICD-10-CM | POA: Diagnosis not present

## 2016-06-03 DIAGNOSIS — I10 Essential (primary) hypertension: Secondary | ICD-10-CM | POA: Diagnosis not present

## 2016-06-03 DIAGNOSIS — M1990 Unspecified osteoarthritis, unspecified site: Secondary | ICD-10-CM | POA: Diagnosis not present

## 2016-06-03 DIAGNOSIS — N183 Chronic kidney disease, stage 3 (moderate): Secondary | ICD-10-CM | POA: Diagnosis not present

## 2016-06-03 DIAGNOSIS — I4891 Unspecified atrial fibrillation: Secondary | ICD-10-CM | POA: Diagnosis not present

## 2016-06-04 DIAGNOSIS — N183 Chronic kidney disease, stage 3 (moderate): Secondary | ICD-10-CM | POA: Diagnosis not present

## 2016-06-04 DIAGNOSIS — I10 Essential (primary) hypertension: Secondary | ICD-10-CM | POA: Diagnosis not present

## 2016-06-04 DIAGNOSIS — E039 Hypothyroidism, unspecified: Secondary | ICD-10-CM | POA: Diagnosis not present

## 2016-06-04 DIAGNOSIS — K219 Gastro-esophageal reflux disease without esophagitis: Secondary | ICD-10-CM | POA: Diagnosis not present

## 2016-06-04 DIAGNOSIS — I519 Heart disease, unspecified: Secondary | ICD-10-CM | POA: Diagnosis not present

## 2016-06-04 DIAGNOSIS — I509 Heart failure, unspecified: Secondary | ICD-10-CM | POA: Diagnosis not present

## 2016-06-04 DIAGNOSIS — J449 Chronic obstructive pulmonary disease, unspecified: Secondary | ICD-10-CM | POA: Diagnosis not present

## 2016-06-04 DIAGNOSIS — I25119 Atherosclerotic heart disease of native coronary artery with unspecified angina pectoris: Secondary | ICD-10-CM | POA: Diagnosis not present

## 2016-06-04 DIAGNOSIS — I4891 Unspecified atrial fibrillation: Secondary | ICD-10-CM | POA: Diagnosis not present

## 2016-06-04 DIAGNOSIS — M1990 Unspecified osteoarthritis, unspecified site: Secondary | ICD-10-CM | POA: Diagnosis not present

## 2016-06-04 DIAGNOSIS — E1159 Type 2 diabetes mellitus with other circulatory complications: Secondary | ICD-10-CM | POA: Diagnosis not present

## 2016-06-04 DIAGNOSIS — J309 Allergic rhinitis, unspecified: Secondary | ICD-10-CM | POA: Diagnosis not present

## 2016-06-04 DIAGNOSIS — J84112 Idiopathic pulmonary fibrosis: Secondary | ICD-10-CM | POA: Diagnosis not present

## 2016-06-05 DIAGNOSIS — I519 Heart disease, unspecified: Secondary | ICD-10-CM | POA: Diagnosis not present

## 2016-06-05 DIAGNOSIS — E1159 Type 2 diabetes mellitus with other circulatory complications: Secondary | ICD-10-CM | POA: Diagnosis not present

## 2016-06-05 DIAGNOSIS — J309 Allergic rhinitis, unspecified: Secondary | ICD-10-CM | POA: Diagnosis not present

## 2016-06-05 DIAGNOSIS — N183 Chronic kidney disease, stage 3 (moderate): Secondary | ICD-10-CM | POA: Diagnosis not present

## 2016-06-05 DIAGNOSIS — I25119 Atherosclerotic heart disease of native coronary artery with unspecified angina pectoris: Secondary | ICD-10-CM | POA: Diagnosis not present

## 2016-06-05 DIAGNOSIS — E039 Hypothyroidism, unspecified: Secondary | ICD-10-CM | POA: Diagnosis not present

## 2016-06-05 DIAGNOSIS — J449 Chronic obstructive pulmonary disease, unspecified: Secondary | ICD-10-CM | POA: Diagnosis not present

## 2016-06-05 DIAGNOSIS — I4891 Unspecified atrial fibrillation: Secondary | ICD-10-CM | POA: Diagnosis not present

## 2016-06-05 DIAGNOSIS — M1990 Unspecified osteoarthritis, unspecified site: Secondary | ICD-10-CM | POA: Diagnosis not present

## 2016-06-05 DIAGNOSIS — I10 Essential (primary) hypertension: Secondary | ICD-10-CM | POA: Diagnosis not present

## 2016-06-05 DIAGNOSIS — K219 Gastro-esophageal reflux disease without esophagitis: Secondary | ICD-10-CM | POA: Diagnosis not present

## 2016-06-05 DIAGNOSIS — J84112 Idiopathic pulmonary fibrosis: Secondary | ICD-10-CM | POA: Diagnosis not present

## 2016-06-05 DIAGNOSIS — I509 Heart failure, unspecified: Secondary | ICD-10-CM | POA: Diagnosis not present

## 2016-06-06 DIAGNOSIS — J84112 Idiopathic pulmonary fibrosis: Secondary | ICD-10-CM | POA: Diagnosis not present

## 2016-06-06 DIAGNOSIS — K219 Gastro-esophageal reflux disease without esophagitis: Secondary | ICD-10-CM | POA: Diagnosis not present

## 2016-06-06 DIAGNOSIS — I25119 Atherosclerotic heart disease of native coronary artery with unspecified angina pectoris: Secondary | ICD-10-CM | POA: Diagnosis not present

## 2016-06-06 DIAGNOSIS — I10 Essential (primary) hypertension: Secondary | ICD-10-CM | POA: Diagnosis not present

## 2016-06-06 DIAGNOSIS — N183 Chronic kidney disease, stage 3 (moderate): Secondary | ICD-10-CM | POA: Diagnosis not present

## 2016-06-06 DIAGNOSIS — E1159 Type 2 diabetes mellitus with other circulatory complications: Secondary | ICD-10-CM | POA: Diagnosis not present

## 2016-06-06 DIAGNOSIS — I519 Heart disease, unspecified: Secondary | ICD-10-CM | POA: Diagnosis not present

## 2016-06-06 DIAGNOSIS — M1990 Unspecified osteoarthritis, unspecified site: Secondary | ICD-10-CM | POA: Diagnosis not present

## 2016-06-06 DIAGNOSIS — J449 Chronic obstructive pulmonary disease, unspecified: Secondary | ICD-10-CM | POA: Diagnosis not present

## 2016-06-06 DIAGNOSIS — E039 Hypothyroidism, unspecified: Secondary | ICD-10-CM | POA: Diagnosis not present

## 2016-06-06 DIAGNOSIS — I4891 Unspecified atrial fibrillation: Secondary | ICD-10-CM | POA: Diagnosis not present

## 2016-06-06 DIAGNOSIS — I509 Heart failure, unspecified: Secondary | ICD-10-CM | POA: Diagnosis not present

## 2016-06-06 DIAGNOSIS — J309 Allergic rhinitis, unspecified: Secondary | ICD-10-CM | POA: Diagnosis not present

## 2016-06-07 DIAGNOSIS — K219 Gastro-esophageal reflux disease without esophagitis: Secondary | ICD-10-CM | POA: Diagnosis not present

## 2016-06-07 DIAGNOSIS — J309 Allergic rhinitis, unspecified: Secondary | ICD-10-CM | POA: Diagnosis not present

## 2016-06-07 DIAGNOSIS — E039 Hypothyroidism, unspecified: Secondary | ICD-10-CM | POA: Diagnosis not present

## 2016-06-07 DIAGNOSIS — E1159 Type 2 diabetes mellitus with other circulatory complications: Secondary | ICD-10-CM | POA: Diagnosis not present

## 2016-06-07 DIAGNOSIS — J84112 Idiopathic pulmonary fibrosis: Secondary | ICD-10-CM | POA: Diagnosis not present

## 2016-06-07 DIAGNOSIS — I519 Heart disease, unspecified: Secondary | ICD-10-CM | POA: Diagnosis not present

## 2016-06-07 DIAGNOSIS — J449 Chronic obstructive pulmonary disease, unspecified: Secondary | ICD-10-CM | POA: Diagnosis not present

## 2016-06-07 DIAGNOSIS — M1990 Unspecified osteoarthritis, unspecified site: Secondary | ICD-10-CM | POA: Diagnosis not present

## 2016-06-07 DIAGNOSIS — I10 Essential (primary) hypertension: Secondary | ICD-10-CM | POA: Diagnosis not present

## 2016-06-07 DIAGNOSIS — I509 Heart failure, unspecified: Secondary | ICD-10-CM | POA: Diagnosis not present

## 2016-06-07 DIAGNOSIS — I25119 Atherosclerotic heart disease of native coronary artery with unspecified angina pectoris: Secondary | ICD-10-CM | POA: Diagnosis not present

## 2016-06-07 DIAGNOSIS — N183 Chronic kidney disease, stage 3 (moderate): Secondary | ICD-10-CM | POA: Diagnosis not present

## 2016-06-07 DIAGNOSIS — I4891 Unspecified atrial fibrillation: Secondary | ICD-10-CM | POA: Diagnosis not present

## 2016-06-08 DIAGNOSIS — E1159 Type 2 diabetes mellitus with other circulatory complications: Secondary | ICD-10-CM | POA: Diagnosis not present

## 2016-06-08 DIAGNOSIS — N183 Chronic kidney disease, stage 3 (moderate): Secondary | ICD-10-CM | POA: Diagnosis not present

## 2016-06-08 DIAGNOSIS — I519 Heart disease, unspecified: Secondary | ICD-10-CM | POA: Diagnosis not present

## 2016-06-08 DIAGNOSIS — J309 Allergic rhinitis, unspecified: Secondary | ICD-10-CM | POA: Diagnosis not present

## 2016-06-08 DIAGNOSIS — K219 Gastro-esophageal reflux disease without esophagitis: Secondary | ICD-10-CM | POA: Diagnosis not present

## 2016-06-08 DIAGNOSIS — I10 Essential (primary) hypertension: Secondary | ICD-10-CM | POA: Diagnosis not present

## 2016-06-08 DIAGNOSIS — I509 Heart failure, unspecified: Secondary | ICD-10-CM | POA: Diagnosis not present

## 2016-06-08 DIAGNOSIS — I4891 Unspecified atrial fibrillation: Secondary | ICD-10-CM | POA: Diagnosis not present

## 2016-06-08 DIAGNOSIS — E039 Hypothyroidism, unspecified: Secondary | ICD-10-CM | POA: Diagnosis not present

## 2016-06-08 DIAGNOSIS — J449 Chronic obstructive pulmonary disease, unspecified: Secondary | ICD-10-CM | POA: Diagnosis not present

## 2016-06-08 DIAGNOSIS — M1990 Unspecified osteoarthritis, unspecified site: Secondary | ICD-10-CM | POA: Diagnosis not present

## 2016-06-08 DIAGNOSIS — I25119 Atherosclerotic heart disease of native coronary artery with unspecified angina pectoris: Secondary | ICD-10-CM | POA: Diagnosis not present

## 2016-06-08 DIAGNOSIS — J84112 Idiopathic pulmonary fibrosis: Secondary | ICD-10-CM | POA: Diagnosis not present

## 2016-06-09 DIAGNOSIS — E039 Hypothyroidism, unspecified: Secondary | ICD-10-CM | POA: Diagnosis not present

## 2016-06-09 DIAGNOSIS — J449 Chronic obstructive pulmonary disease, unspecified: Secondary | ICD-10-CM | POA: Diagnosis not present

## 2016-06-09 DIAGNOSIS — N183 Chronic kidney disease, stage 3 (moderate): Secondary | ICD-10-CM | POA: Diagnosis not present

## 2016-06-09 DIAGNOSIS — K219 Gastro-esophageal reflux disease without esophagitis: Secondary | ICD-10-CM | POA: Diagnosis not present

## 2016-06-09 DIAGNOSIS — E1159 Type 2 diabetes mellitus with other circulatory complications: Secondary | ICD-10-CM | POA: Diagnosis not present

## 2016-06-09 DIAGNOSIS — I519 Heart disease, unspecified: Secondary | ICD-10-CM | POA: Diagnosis not present

## 2016-06-09 DIAGNOSIS — I25119 Atherosclerotic heart disease of native coronary artery with unspecified angina pectoris: Secondary | ICD-10-CM | POA: Diagnosis not present

## 2016-06-09 DIAGNOSIS — I10 Essential (primary) hypertension: Secondary | ICD-10-CM | POA: Diagnosis not present

## 2016-06-09 DIAGNOSIS — I509 Heart failure, unspecified: Secondary | ICD-10-CM | POA: Diagnosis not present

## 2016-06-09 DIAGNOSIS — J84112 Idiopathic pulmonary fibrosis: Secondary | ICD-10-CM | POA: Diagnosis not present

## 2016-06-09 DIAGNOSIS — J309 Allergic rhinitis, unspecified: Secondary | ICD-10-CM | POA: Diagnosis not present

## 2016-06-09 DIAGNOSIS — M1990 Unspecified osteoarthritis, unspecified site: Secondary | ICD-10-CM | POA: Diagnosis not present

## 2016-06-09 DIAGNOSIS — I4891 Unspecified atrial fibrillation: Secondary | ICD-10-CM | POA: Diagnosis not present

## 2016-06-10 DIAGNOSIS — I519 Heart disease, unspecified: Secondary | ICD-10-CM | POA: Diagnosis not present

## 2016-06-10 DIAGNOSIS — N183 Chronic kidney disease, stage 3 (moderate): Secondary | ICD-10-CM | POA: Diagnosis not present

## 2016-06-10 DIAGNOSIS — E1159 Type 2 diabetes mellitus with other circulatory complications: Secondary | ICD-10-CM | POA: Diagnosis not present

## 2016-06-10 DIAGNOSIS — M1990 Unspecified osteoarthritis, unspecified site: Secondary | ICD-10-CM | POA: Diagnosis not present

## 2016-06-10 DIAGNOSIS — E039 Hypothyroidism, unspecified: Secondary | ICD-10-CM | POA: Diagnosis not present

## 2016-06-10 DIAGNOSIS — I10 Essential (primary) hypertension: Secondary | ICD-10-CM | POA: Diagnosis not present

## 2016-06-10 DIAGNOSIS — K219 Gastro-esophageal reflux disease without esophagitis: Secondary | ICD-10-CM | POA: Diagnosis not present

## 2016-06-10 DIAGNOSIS — I25119 Atherosclerotic heart disease of native coronary artery with unspecified angina pectoris: Secondary | ICD-10-CM | POA: Diagnosis not present

## 2016-06-10 DIAGNOSIS — J449 Chronic obstructive pulmonary disease, unspecified: Secondary | ICD-10-CM | POA: Diagnosis not present

## 2016-06-10 DIAGNOSIS — J309 Allergic rhinitis, unspecified: Secondary | ICD-10-CM | POA: Diagnosis not present

## 2016-06-10 DIAGNOSIS — I509 Heart failure, unspecified: Secondary | ICD-10-CM | POA: Diagnosis not present

## 2016-06-10 DIAGNOSIS — I4891 Unspecified atrial fibrillation: Secondary | ICD-10-CM | POA: Diagnosis not present

## 2016-06-10 DIAGNOSIS — J84112 Idiopathic pulmonary fibrosis: Secondary | ICD-10-CM | POA: Diagnosis not present

## 2016-06-11 DIAGNOSIS — I509 Heart failure, unspecified: Secondary | ICD-10-CM | POA: Diagnosis not present

## 2016-06-11 DIAGNOSIS — M1990 Unspecified osteoarthritis, unspecified site: Secondary | ICD-10-CM | POA: Diagnosis not present

## 2016-06-11 DIAGNOSIS — J84112 Idiopathic pulmonary fibrosis: Secondary | ICD-10-CM | POA: Diagnosis not present

## 2016-06-11 DIAGNOSIS — J309 Allergic rhinitis, unspecified: Secondary | ICD-10-CM | POA: Diagnosis not present

## 2016-06-11 DIAGNOSIS — I10 Essential (primary) hypertension: Secondary | ICD-10-CM | POA: Diagnosis not present

## 2016-06-11 DIAGNOSIS — I25119 Atherosclerotic heart disease of native coronary artery with unspecified angina pectoris: Secondary | ICD-10-CM | POA: Diagnosis not present

## 2016-06-11 DIAGNOSIS — E1159 Type 2 diabetes mellitus with other circulatory complications: Secondary | ICD-10-CM | POA: Diagnosis not present

## 2016-06-11 DIAGNOSIS — J449 Chronic obstructive pulmonary disease, unspecified: Secondary | ICD-10-CM | POA: Diagnosis not present

## 2016-06-11 DIAGNOSIS — N183 Chronic kidney disease, stage 3 (moderate): Secondary | ICD-10-CM | POA: Diagnosis not present

## 2016-06-11 DIAGNOSIS — K219 Gastro-esophageal reflux disease without esophagitis: Secondary | ICD-10-CM | POA: Diagnosis not present

## 2016-06-11 DIAGNOSIS — E039 Hypothyroidism, unspecified: Secondary | ICD-10-CM | POA: Diagnosis not present

## 2016-06-11 DIAGNOSIS — I4891 Unspecified atrial fibrillation: Secondary | ICD-10-CM | POA: Diagnosis not present

## 2016-06-11 DIAGNOSIS — I519 Heart disease, unspecified: Secondary | ICD-10-CM | POA: Diagnosis not present

## 2016-06-12 DIAGNOSIS — E039 Hypothyroidism, unspecified: Secondary | ICD-10-CM | POA: Diagnosis not present

## 2016-06-12 DIAGNOSIS — I4891 Unspecified atrial fibrillation: Secondary | ICD-10-CM | POA: Diagnosis not present

## 2016-06-12 DIAGNOSIS — J449 Chronic obstructive pulmonary disease, unspecified: Secondary | ICD-10-CM | POA: Diagnosis not present

## 2016-06-12 DIAGNOSIS — I509 Heart failure, unspecified: Secondary | ICD-10-CM | POA: Diagnosis not present

## 2016-06-12 DIAGNOSIS — K219 Gastro-esophageal reflux disease without esophagitis: Secondary | ICD-10-CM | POA: Diagnosis not present

## 2016-06-12 DIAGNOSIS — J84112 Idiopathic pulmonary fibrosis: Secondary | ICD-10-CM | POA: Diagnosis not present

## 2016-06-12 DIAGNOSIS — E1159 Type 2 diabetes mellitus with other circulatory complications: Secondary | ICD-10-CM | POA: Diagnosis not present

## 2016-06-12 DIAGNOSIS — I25119 Atherosclerotic heart disease of native coronary artery with unspecified angina pectoris: Secondary | ICD-10-CM | POA: Diagnosis not present

## 2016-06-12 DIAGNOSIS — I10 Essential (primary) hypertension: Secondary | ICD-10-CM | POA: Diagnosis not present

## 2016-06-12 DIAGNOSIS — J309 Allergic rhinitis, unspecified: Secondary | ICD-10-CM | POA: Diagnosis not present

## 2016-06-12 DIAGNOSIS — I519 Heart disease, unspecified: Secondary | ICD-10-CM | POA: Diagnosis not present

## 2016-06-12 DIAGNOSIS — N183 Chronic kidney disease, stage 3 (moderate): Secondary | ICD-10-CM | POA: Diagnosis not present

## 2016-06-12 DIAGNOSIS — M1990 Unspecified osteoarthritis, unspecified site: Secondary | ICD-10-CM | POA: Diagnosis not present

## 2016-06-13 DIAGNOSIS — J84112 Idiopathic pulmonary fibrosis: Secondary | ICD-10-CM | POA: Diagnosis not present

## 2016-06-13 DIAGNOSIS — I25119 Atherosclerotic heart disease of native coronary artery with unspecified angina pectoris: Secondary | ICD-10-CM | POA: Diagnosis not present

## 2016-06-13 DIAGNOSIS — I519 Heart disease, unspecified: Secondary | ICD-10-CM | POA: Diagnosis not present

## 2016-06-13 DIAGNOSIS — J309 Allergic rhinitis, unspecified: Secondary | ICD-10-CM | POA: Diagnosis not present

## 2016-06-13 DIAGNOSIS — E039 Hypothyroidism, unspecified: Secondary | ICD-10-CM | POA: Diagnosis not present

## 2016-06-13 DIAGNOSIS — M1990 Unspecified osteoarthritis, unspecified site: Secondary | ICD-10-CM | POA: Diagnosis not present

## 2016-06-13 DIAGNOSIS — N183 Chronic kidney disease, stage 3 (moderate): Secondary | ICD-10-CM | POA: Diagnosis not present

## 2016-06-13 DIAGNOSIS — J449 Chronic obstructive pulmonary disease, unspecified: Secondary | ICD-10-CM | POA: Diagnosis not present

## 2016-06-13 DIAGNOSIS — E1159 Type 2 diabetes mellitus with other circulatory complications: Secondary | ICD-10-CM | POA: Diagnosis not present

## 2016-06-13 DIAGNOSIS — I509 Heart failure, unspecified: Secondary | ICD-10-CM | POA: Diagnosis not present

## 2016-06-13 DIAGNOSIS — I10 Essential (primary) hypertension: Secondary | ICD-10-CM | POA: Diagnosis not present

## 2016-06-13 DIAGNOSIS — I4891 Unspecified atrial fibrillation: Secondary | ICD-10-CM | POA: Diagnosis not present

## 2016-06-13 DIAGNOSIS — K219 Gastro-esophageal reflux disease without esophagitis: Secondary | ICD-10-CM | POA: Diagnosis not present

## 2016-06-14 DIAGNOSIS — K219 Gastro-esophageal reflux disease without esophagitis: Secondary | ICD-10-CM | POA: Diagnosis not present

## 2016-06-14 DIAGNOSIS — J449 Chronic obstructive pulmonary disease, unspecified: Secondary | ICD-10-CM | POA: Diagnosis not present

## 2016-06-14 DIAGNOSIS — I519 Heart disease, unspecified: Secondary | ICD-10-CM | POA: Diagnosis not present

## 2016-06-14 DIAGNOSIS — E039 Hypothyroidism, unspecified: Secondary | ICD-10-CM | POA: Diagnosis not present

## 2016-06-14 DIAGNOSIS — I10 Essential (primary) hypertension: Secondary | ICD-10-CM | POA: Diagnosis not present

## 2016-06-14 DIAGNOSIS — J309 Allergic rhinitis, unspecified: Secondary | ICD-10-CM | POA: Diagnosis not present

## 2016-06-14 DIAGNOSIS — I509 Heart failure, unspecified: Secondary | ICD-10-CM | POA: Diagnosis not present

## 2016-06-14 DIAGNOSIS — E1159 Type 2 diabetes mellitus with other circulatory complications: Secondary | ICD-10-CM | POA: Diagnosis not present

## 2016-06-14 DIAGNOSIS — J84112 Idiopathic pulmonary fibrosis: Secondary | ICD-10-CM | POA: Diagnosis not present

## 2016-06-14 DIAGNOSIS — I25119 Atherosclerotic heart disease of native coronary artery with unspecified angina pectoris: Secondary | ICD-10-CM | POA: Diagnosis not present

## 2016-06-14 DIAGNOSIS — N183 Chronic kidney disease, stage 3 (moderate): Secondary | ICD-10-CM | POA: Diagnosis not present

## 2016-06-14 DIAGNOSIS — M1990 Unspecified osteoarthritis, unspecified site: Secondary | ICD-10-CM | POA: Diagnosis not present

## 2016-06-14 DIAGNOSIS — I4891 Unspecified atrial fibrillation: Secondary | ICD-10-CM | POA: Diagnosis not present

## 2016-06-15 DIAGNOSIS — I509 Heart failure, unspecified: Secondary | ICD-10-CM | POA: Diagnosis not present

## 2016-06-15 DIAGNOSIS — I10 Essential (primary) hypertension: Secondary | ICD-10-CM | POA: Diagnosis not present

## 2016-06-15 DIAGNOSIS — I25119 Atherosclerotic heart disease of native coronary artery with unspecified angina pectoris: Secondary | ICD-10-CM | POA: Diagnosis not present

## 2016-06-15 DIAGNOSIS — J84112 Idiopathic pulmonary fibrosis: Secondary | ICD-10-CM | POA: Diagnosis not present

## 2016-06-15 DIAGNOSIS — J449 Chronic obstructive pulmonary disease, unspecified: Secondary | ICD-10-CM | POA: Diagnosis not present

## 2016-06-15 DIAGNOSIS — E1159 Type 2 diabetes mellitus with other circulatory complications: Secondary | ICD-10-CM | POA: Diagnosis not present

## 2016-06-15 DIAGNOSIS — M1990 Unspecified osteoarthritis, unspecified site: Secondary | ICD-10-CM | POA: Diagnosis not present

## 2016-06-15 DIAGNOSIS — I4891 Unspecified atrial fibrillation: Secondary | ICD-10-CM | POA: Diagnosis not present

## 2016-06-15 DIAGNOSIS — I519 Heart disease, unspecified: Secondary | ICD-10-CM | POA: Diagnosis not present

## 2016-06-15 DIAGNOSIS — E039 Hypothyroidism, unspecified: Secondary | ICD-10-CM | POA: Diagnosis not present

## 2016-06-15 DIAGNOSIS — J309 Allergic rhinitis, unspecified: Secondary | ICD-10-CM | POA: Diagnosis not present

## 2016-06-15 DIAGNOSIS — N183 Chronic kidney disease, stage 3 (moderate): Secondary | ICD-10-CM | POA: Diagnosis not present

## 2016-06-15 DIAGNOSIS — K219 Gastro-esophageal reflux disease without esophagitis: Secondary | ICD-10-CM | POA: Diagnosis not present

## 2016-06-16 DIAGNOSIS — K219 Gastro-esophageal reflux disease without esophagitis: Secondary | ICD-10-CM | POA: Diagnosis not present

## 2016-06-16 DIAGNOSIS — M1990 Unspecified osteoarthritis, unspecified site: Secondary | ICD-10-CM | POA: Diagnosis not present

## 2016-06-16 DIAGNOSIS — I4891 Unspecified atrial fibrillation: Secondary | ICD-10-CM | POA: Diagnosis not present

## 2016-06-16 DIAGNOSIS — J449 Chronic obstructive pulmonary disease, unspecified: Secondary | ICD-10-CM | POA: Diagnosis not present

## 2016-06-16 DIAGNOSIS — E039 Hypothyroidism, unspecified: Secondary | ICD-10-CM | POA: Diagnosis not present

## 2016-06-16 DIAGNOSIS — I519 Heart disease, unspecified: Secondary | ICD-10-CM | POA: Diagnosis not present

## 2016-06-16 DIAGNOSIS — I25119 Atherosclerotic heart disease of native coronary artery with unspecified angina pectoris: Secondary | ICD-10-CM | POA: Diagnosis not present

## 2016-06-16 DIAGNOSIS — I10 Essential (primary) hypertension: Secondary | ICD-10-CM | POA: Diagnosis not present

## 2016-06-16 DIAGNOSIS — I509 Heart failure, unspecified: Secondary | ICD-10-CM | POA: Diagnosis not present

## 2016-06-16 DIAGNOSIS — J309 Allergic rhinitis, unspecified: Secondary | ICD-10-CM | POA: Diagnosis not present

## 2016-06-16 DIAGNOSIS — N183 Chronic kidney disease, stage 3 (moderate): Secondary | ICD-10-CM | POA: Diagnosis not present

## 2016-06-16 DIAGNOSIS — E1159 Type 2 diabetes mellitus with other circulatory complications: Secondary | ICD-10-CM | POA: Diagnosis not present

## 2016-06-16 DIAGNOSIS — J84112 Idiopathic pulmonary fibrosis: Secondary | ICD-10-CM | POA: Diagnosis not present

## 2016-06-17 DIAGNOSIS — J309 Allergic rhinitis, unspecified: Secondary | ICD-10-CM | POA: Diagnosis not present

## 2016-06-17 DIAGNOSIS — J84112 Idiopathic pulmonary fibrosis: Secondary | ICD-10-CM | POA: Diagnosis not present

## 2016-06-17 DIAGNOSIS — K219 Gastro-esophageal reflux disease without esophagitis: Secondary | ICD-10-CM | POA: Diagnosis not present

## 2016-06-17 DIAGNOSIS — I25119 Atherosclerotic heart disease of native coronary artery with unspecified angina pectoris: Secondary | ICD-10-CM | POA: Diagnosis not present

## 2016-06-17 DIAGNOSIS — I10 Essential (primary) hypertension: Secondary | ICD-10-CM | POA: Diagnosis not present

## 2016-06-17 DIAGNOSIS — J449 Chronic obstructive pulmonary disease, unspecified: Secondary | ICD-10-CM | POA: Diagnosis not present

## 2016-06-17 DIAGNOSIS — I519 Heart disease, unspecified: Secondary | ICD-10-CM | POA: Diagnosis not present

## 2016-06-17 DIAGNOSIS — M1990 Unspecified osteoarthritis, unspecified site: Secondary | ICD-10-CM | POA: Diagnosis not present

## 2016-06-17 DIAGNOSIS — N183 Chronic kidney disease, stage 3 (moderate): Secondary | ICD-10-CM | POA: Diagnosis not present

## 2016-06-17 DIAGNOSIS — I509 Heart failure, unspecified: Secondary | ICD-10-CM | POA: Diagnosis not present

## 2016-06-17 DIAGNOSIS — E1159 Type 2 diabetes mellitus with other circulatory complications: Secondary | ICD-10-CM | POA: Diagnosis not present

## 2016-06-17 DIAGNOSIS — I4891 Unspecified atrial fibrillation: Secondary | ICD-10-CM | POA: Diagnosis not present

## 2016-06-17 DIAGNOSIS — E039 Hypothyroidism, unspecified: Secondary | ICD-10-CM | POA: Diagnosis not present

## 2016-06-18 DIAGNOSIS — I519 Heart disease, unspecified: Secondary | ICD-10-CM | POA: Diagnosis not present

## 2016-06-18 DIAGNOSIS — J309 Allergic rhinitis, unspecified: Secondary | ICD-10-CM | POA: Diagnosis not present

## 2016-06-18 DIAGNOSIS — E039 Hypothyroidism, unspecified: Secondary | ICD-10-CM | POA: Diagnosis not present

## 2016-06-18 DIAGNOSIS — I509 Heart failure, unspecified: Secondary | ICD-10-CM | POA: Diagnosis not present

## 2016-06-18 DIAGNOSIS — E1159 Type 2 diabetes mellitus with other circulatory complications: Secondary | ICD-10-CM | POA: Diagnosis not present

## 2016-06-18 DIAGNOSIS — I25119 Atherosclerotic heart disease of native coronary artery with unspecified angina pectoris: Secondary | ICD-10-CM | POA: Diagnosis not present

## 2016-06-18 DIAGNOSIS — I4891 Unspecified atrial fibrillation: Secondary | ICD-10-CM | POA: Diagnosis not present

## 2016-06-18 DIAGNOSIS — J449 Chronic obstructive pulmonary disease, unspecified: Secondary | ICD-10-CM | POA: Diagnosis not present

## 2016-06-18 DIAGNOSIS — M1990 Unspecified osteoarthritis, unspecified site: Secondary | ICD-10-CM | POA: Diagnosis not present

## 2016-06-18 DIAGNOSIS — J84112 Idiopathic pulmonary fibrosis: Secondary | ICD-10-CM | POA: Diagnosis not present

## 2016-06-18 DIAGNOSIS — K219 Gastro-esophageal reflux disease without esophagitis: Secondary | ICD-10-CM | POA: Diagnosis not present

## 2016-06-18 DIAGNOSIS — I10 Essential (primary) hypertension: Secondary | ICD-10-CM | POA: Diagnosis not present

## 2016-06-18 DIAGNOSIS — N183 Chronic kidney disease, stage 3 (moderate): Secondary | ICD-10-CM | POA: Diagnosis not present

## 2016-07-20 DEATH — deceased

## 2016-09-23 ENCOUNTER — Encounter (HOSPITAL_COMMUNITY): Payer: Self-pay | Admitting: Pharmacist

## 2016-09-23 NOTE — Research (Signed)
Patient had been enrolled into a previously documented and consented trial--obtaining 1 additional tube of blood as documented within the original informed consent.

## 2017-04-21 IMAGING — [place] PULMONARY VENT & PERF
16 series · 16 of 16 positions shown · non-contrast
Comparison: Chest CT from earlier today

CLINICAL DATA: Shortness of breath.  Chronic renal failure

EXAM:
NUCLEAR MEDICINE VENTILATION - PERFUSION LUNG SCAN
TECHNIQUE: Ventilation images were obtained in multiple projections using
inhaled aerosol 1c-00m DTPA. Perfusion images were obtained in
multiple projections after intravenous injection of 1c-00m MAA.
RADIOPHARMACEUTICALS:  33 mCi 1echnetium-OOm DTPA aerosol inhalation
and 4.2 mCi 1echnetium-OOm MAA IV

[Series 1: ant/post vent · 4.14mm/px · 1 of 1 slices shown (1 of 2)]
[im 1/1]
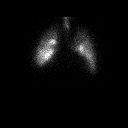

[Series 1: ant/post vent · 4.14mm/px · 1 of 1 slices shown (2 of 2)]
[im 1/1]
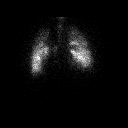

[Series 2: lao/rpo vent · 4.14mm/px · 1 of 1 slices shown (1 of 2)]
[im 1/1]
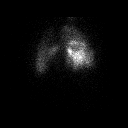

[Series 2: lao/rpo vent · 4.14mm/px · 1 of 1 slices shown (2 of 2)]
[im 1/1]
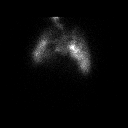

[Series 3: lpo/rao vent · 4.14mm/px · 1 of 1 slices shown (1 of 2)]
[im 1/1]
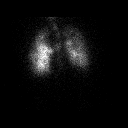

[Series 3: lpo/rao vent · 4.14mm/px · 1 of 1 slices shown (2 of 2)]
[im 1/1]
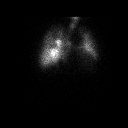

[Series 4: lt lat/rt lat vent · 4.14mm/px · 1 of 1 slices shown (1 of 2)]
[im 1/1]
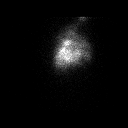

[Series 4: lt lat/rt lat vent · 4.14mm/px · 1 of 1 slices shown (2 of 2)]
[im 1/1]
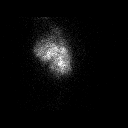

[Series 5: lt lat/rt lat perf · 4.14mm/px · 1 of 1 slices shown (1 of 2)]
[im 1/1]
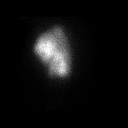

[Series 5: lt lat/rt lat perf · 4.14mm/px · 1 of 1 slices shown (2 of 2)]
[im 1/1]
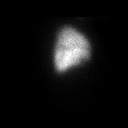

[Series 6: lpo/rao perf · 4.14mm/px · 1 of 1 slices shown (1 of 2)]
[im 1/1]
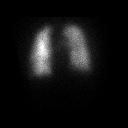

[Series 6: lpo/rao perf · 4.14mm/px · 1 of 1 slices shown (2 of 2)]
[im 1/1]
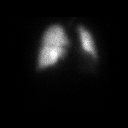

[Series 7: ant/post perf · 4.14mm/px · 1 of 1 slices shown (1 of 2)]
[im 1/1]
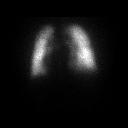

[Series 7: ant/post perf · 4.14mm/px · 1 of 1 slices shown (2 of 2)]
[im 1/1]
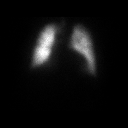

[Series 8: lao/rpo perf · 4.14mm/px · 1 of 1 slices shown (1 of 2)]
[im 1/1]
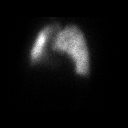

[Series 8: lao/rpo perf · 4.14mm/px · 1 of 1 slices shown (2 of 2)]
[im 1/1]
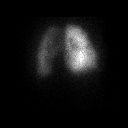

[16 of 16 positions shown; findings below may reference images not displayed]

FINDINGS: Few small areas of matched peripheral perfusion and ventilation
defect. RPO projection right lung central photopenia attributed to
the hilum or fissural fluid. There is cardiomegaly.
IMPRESSION: Very low probability for pulmonary embolism by PIOPED II.
